# Patient Record
Sex: Male | Born: 1945 | Race: White | Hispanic: No | Marital: Married | Smoking: Never smoker
Health system: Southern US, Community
[De-identification: ages and names within clinical notes are randomized; demographics above are authoritative.]

## PROBLEM LIST (undated history)

## (undated) DIAGNOSIS — I2699 Other pulmonary embolism without acute cor pulmonale: Secondary | ICD-10-CM

## (undated) DIAGNOSIS — C4490 Unspecified malignant neoplasm of skin, unspecified: Secondary | ICD-10-CM

## (undated) DIAGNOSIS — L570 Actinic keratosis: Secondary | ICD-10-CM

## (undated) DIAGNOSIS — G459 Transient cerebral ischemic attack, unspecified: Secondary | ICD-10-CM

## (undated) DIAGNOSIS — I4891 Unspecified atrial fibrillation: Secondary | ICD-10-CM

## (undated) DIAGNOSIS — I499 Cardiac arrhythmia, unspecified: Secondary | ICD-10-CM

## (undated) HISTORY — PX: APPENDECTOMY: SHX54

## (undated) HISTORY — DX: Actinic keratosis: L57.0

## (undated) HISTORY — PX: PROSTATE SURGERY: SHX751

## (undated) HISTORY — PX: OTHER SURGICAL HISTORY: SHX169

---

## 2015-12-12 ENCOUNTER — Encounter: Payer: Self-pay | Admitting: Cardiovascular Disease

## 2016-12-12 ENCOUNTER — Encounter: Payer: Self-pay | Admitting: Cardiovascular Disease

## 2018-12-17 ENCOUNTER — Encounter: Payer: Self-pay | Admitting: Cardiovascular Disease

## 2019-09-05 ENCOUNTER — Ambulatory Visit (INDEPENDENT_AMBULATORY_CARE_PROVIDER_SITE_OTHER): Payer: Medicare Other | Admitting: Urology

## 2019-09-05 ENCOUNTER — Other Ambulatory Visit: Payer: Self-pay

## 2019-09-05 VITALS — BP 110/76 | HR 103 | Ht 76.0 in | Wt 264.0 lb

## 2019-09-05 DIAGNOSIS — C61 Malignant neoplasm of prostate: Secondary | ICD-10-CM | POA: Diagnosis not present

## 2019-09-05 NOTE — Progress Notes (Addendum)
   PATIENT ID: Nicholas [name], male     DOB: 04-26-1945, 74 y.o.     MRN: [id number]   ENCOUNTER: 09/05/19, 1:22 PM     REFERRING PROVIDER: No referring provider defined for this encounter.  Chief Complaint  Patient presents with  . Establish Care    HPI: Nicholas [name] is a 74 y.o. male presenting today to establish care. Pt recently moved to area to be close to family.  History prostate cancer treated 2014.  Wife brought prior urology records which were reviewed:   Prostate biopsy 06/18/2012; PSA 6.8  1/12 cores positive Gleason 3+4 adenocarcinoma involving 100% of the submitted core  Treated with CyberKnife radiation completed August 2014  Last seen by his urologist 02/2019 with PSA of 0.13  PSA March 2016-present range between 0.13 and 0.57  . Denies urinary symptoms of dysuria, hematuria, and flank pain . Symptoms of mild incontinence on rare occasion present . Some urinary hesitancy and urgency present . PSA has been low and stable   PMHx: No past medical history on file.  SURGICAL HISTORY: None  HOME MEDICATIONS:  Allergies as of 09/05/2019   Not on File     Medication List       Accurate as of September 05, 2019  1:22 PM. If you have any questions, ask your nurse or doctor.        atenolol 25 MG tablet Commonly known as: TENORMIN Take 12.5 mg by mouth 2 (two) times daily.   colchicine 0.6 MG tablet   febuxostat 40 MG tablet Commonly known as: ULORIC Take 40 mg by mouth daily.   ofloxacin 0.3 % OTIC solution Commonly known as: FLOXIN SMARTSIG:10 Drop(s) Right Ear Daily   Xarelto 20 MG Tabs tablet Generic drug: rivaroxaban Take 20 mg by mouth daily.       ALLERGIES: Not on File  FAMILY HISTORY: No family history on file.  SOCIAL HISTORY:  has no history on file for tobacco use, alcohol use, and drug use.  PHYSICAL EXAM: BP 110/76 (BP Location: Left Arm, Patient Position: Sitting, Cuff Size: Large)   Pulse (!) 103   Ht 6\' 4"  (1.93 m)    Wt 264 lb (119.7 kg)   BMI 32.14 kg/m   Constitutional:  Alert and oriented, No acute distress. HEENT: [place] AT, moist mucus membranes.  Trachea midline, no masses. Cardiovascular: No clubbing, cyanosis, or edema. Respiratory: Normal respiratory effort, no increased work of breathing. Neurologic: Grossly intact, no focal deficits, moving all 4 extremities. Psychiatric: Normal mood and affect.   ASSESSMENT/PLAN: 1.  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 PCP is to be established with [facility], [place] [address] [place], [place] [phone]  By signing my name below, I, [employer], attest that this documentation has been prepared under the direction and in the presence of John Giovanni, MD. Electronically Signed: Franchot Erichsen 09/05/19, 1:22 PM   I have reviewed the above documentation for accuracy and completeness, and I agree with the above.   Abbie Sons, MD

## 2019-09-05 NOTE — Patient Instructions (Signed)
Prostate Cancer Screening  Prostate cancer screening is a test that is done to check for the presence of prostate cancer in men. The prostate gland is a walnut-sized gland that is located below the bladder and in front of the rectum in males. The function of the prostate is to add fluid to semen during ejaculation. Prostate cancer is the second most common type of cancer in men. Who should have prostate cancer screening?  Screening recommendations vary based on age and other risk factors. Screening is recommended if:  You are older than age 55. If you are age 55-69, talk with your health care provider about your need for screening and how often screening should be done. Because most prostate cancers are slow growing and will not cause death, screening is generally reserved in this age group for men who have a 10-15-year life expectancy.  You are younger than age 55, and you have these risk factors: ? Being a black male or a male of African descent. ? Having a father, brother, or uncle who has been diagnosed with prostate cancer. The risk is higher if your family member's cancer occurred at an early age. Screening is not recommended if:  You are younger than age 40.  You are between the ages of 40 and 54 and you have no risk factors.  You are 76 years of age or older. At this age, the risks that screening can cause are greater than the benefits that it may provide. If you are at high risk for prostate cancer, your health care provider may recommend that you have screenings more often or that you start screening at a younger age. How is screening for prostate cancer done? The recommended prostate cancer screening test is a blood test called the prostate-specific antigen (PSA) test. PSA is a protein that is made in the prostate. As you age, your prostate naturally produces more PSA. Abnormally high PSA levels may be caused by:  Prostate cancer.  An enlarged prostate that is not caused by cancer  (benign prostatic hyperplasia, BPH). This condition is very common in older men.  A prostate gland infection (prostatitis). Depending on the PSA results, you may need more tests, such as:  A physical exam to check the size of your prostate gland.  Blood and imaging tests.  A procedure to remove tissue samples from your prostate gland for testing (biopsy). What are the benefits of prostate cancer screening?  Screening can help to identify cancer at an early stage, before symptoms start and when the cancer can be treated more easily.  There is a small chance that screening may lower your risk of dying from prostate cancer. The chance is small because prostate cancer is a slow-growing cancer, and most men with prostate cancer die from a different cause. What are the risks of prostate cancer screening? The main risk of prostate cancer screening is diagnosing and treating prostate cancer that would never have caused any symptoms or problems. This is called overdiagnosisand overtreatment. PSA screening cannot tell you if your PSA is high due to cancer or a different cause. A prostate biopsy is the only procedure to diagnose prostate cancer. Even the results of a biopsy may not tell you if your cancer needs to be treated. Slow-growing prostate cancer may not need any treatment other than monitoring, so diagnosing and treating it may cause unnecessary stress or other side effects. A prostate biopsy may also cause:  Infection or fever.  A false negative. This is   a result that shows that you do not have prostate cancer when you actually do have prostate cancer. Questions to ask your health care provider  When should I start prostate cancer screening?  What is my risk for prostate cancer?  How often do I need screening?  What type of screening tests do I need?  How do I get my test results?  What do my results mean?  Do I need treatment? Where to find more information  The American Cancer  Society: www.cancer.org  American Urological Association: www.auanet.org Contact a health care provider if:  You have difficulty urinating.  You have pain when you urinate or ejaculate.  You have blood in your urine or semen.  You have pain in your back or in the area of your prostate. Summary  Prostate cancer is a common type of cancer in men. The prostate gland is located below the bladder and in front of the rectum. This gland adds fluid to semen during ejaculation.  Prostate cancer screening may identify cancer at an early stage, when the cancer can be treated more easily.  The prostate-specific antigen (PSA) test is the recommended screening test for prostate cancer.  Discuss the risks and benefits of prostate cancer screening with your health care provider. If you are age 76 or older, the risks that screening can cause are greater than the benefits that it may provide. This information is not intended to replace advice given to you by your health care provider. Make sure you discuss any questions you have with your health care provider. Document Revised: 10/07/2018 Document Reviewed: 10/07/2018 Elsevier Patient Education  2020 Elsevier Inc.  

## 2019-09-06 ENCOUNTER — Telehealth: Payer: Self-pay | Admitting: *Deleted

## 2019-09-06 LAB — URINALYSIS, COMPLETE
Bilirubin, UA: NEGATIVE
Glucose, UA: NEGATIVE
Leukocytes,UA: NEGATIVE
Nitrite, UA: NEGATIVE
Protein,UA: NEGATIVE
Specific Gravity, UA: 1.025 (ref 1.005–1.030)
Urobilinogen, Ur: 0.2 mg/dL (ref 0.2–1.0)
pH, UA: 5 (ref 5.0–7.5)

## 2019-09-06 LAB — PSA: Prostate Specific Ag, Serum: 0.3 ng/mL (ref 0.0–4.0)

## 2019-09-06 LAB — MICROSCOPIC EXAMINATION: Bacteria, UA: NONE SEEN

## 2019-09-06 NOTE — Telephone Encounter (Signed)
Notified patient as instructed, patient pleased. Discussed follow-up appointments, patient agrees  

## 2019-09-06 NOTE — Telephone Encounter (Signed)
-----   Message from Abbie Sons, MD sent at 09/06/2019  7:48 AM EDT ----- PSA was 0.3.  42-month repeat as scheduled

## 2019-09-07 ENCOUNTER — Encounter: Payer: Self-pay | Admitting: Urology

## 2019-09-09 ENCOUNTER — Other Ambulatory Visit: Payer: Self-pay | Admitting: Urology

## 2019-09-09 DIAGNOSIS — Z1211 Encounter for screening for malignant neoplasm of colon: Secondary | ICD-10-CM

## 2019-09-20 ENCOUNTER — Ambulatory Visit: Payer: Self-pay | Admitting: Cardiovascular Disease

## 2019-10-18 DIAGNOSIS — M1A9XX0 Chronic gout, unspecified, without tophus (tophi): Secondary | ICD-10-CM

## 2019-10-18 DIAGNOSIS — M1A00X0 Idiopathic chronic gout, unspecified site, without tophus (tophi): Secondary | ICD-10-CM

## 2019-10-18 HISTORY — DX: Idiopathic chronic gout, unspecified site, without tophus (tophi): M1A.00X0

## 2019-10-18 HISTORY — DX: Chronic gout, unspecified, without tophus (tophi): M1A.9XX0

## 2019-10-25 DIAGNOSIS — C801 Malignant (primary) neoplasm, unspecified: Secondary | ICD-10-CM

## 2019-10-25 HISTORY — DX: Malignant (primary) neoplasm, unspecified: C80.1

## 2019-11-21 ENCOUNTER — Other Ambulatory Visit: Payer: Self-pay

## 2019-11-21 ENCOUNTER — Other Ambulatory Visit
Admission: RE | Admit: 2019-11-21 | Discharge: 2019-11-21 | Disposition: A | Discharge status: Home / Self Care | Payer: Medicare Other | Source: Ambulatory Visit | Attending: Internal Medicine | Admitting: Internal Medicine

## 2019-11-21 DIAGNOSIS — Z20822 Contact with and (suspected) exposure to covid-19: Secondary | ICD-10-CM | POA: Insufficient documentation

## 2019-11-21 DIAGNOSIS — Z01812 Encounter for preprocedural laboratory examination: Secondary | ICD-10-CM | POA: Insufficient documentation

## 2019-11-22 ENCOUNTER — Encounter: Payer: Self-pay | Admitting: Internal Medicine

## 2019-11-22 LAB — SARS CORONAVIRUS 2 (TAT 6-24 HRS): SARS Coronavirus 2: NEGATIVE

## 2019-11-23 ENCOUNTER — Ambulatory Visit: Payer: Medicare Other | Admitting: Anesthesiology

## 2019-11-23 ENCOUNTER — Ambulatory Visit
Admission: RE | Admit: 2019-11-23 | Discharge: 2019-11-23 | Disposition: A | Discharge status: Home / Self Care | Payer: Medicare Other | Attending: Internal Medicine | Admitting: Internal Medicine

## 2019-11-23 ENCOUNTER — Encounter: Payer: Self-pay | Admitting: Internal Medicine

## 2019-11-23 ENCOUNTER — Encounter
Admission: RE | Disposition: A | Discharge status: Home / Self Care | Payer: Self-pay | Source: Home / Self Care | Attending: Internal Medicine

## 2019-11-23 DIAGNOSIS — Z7901 Long term (current) use of anticoagulants: Secondary | ICD-10-CM | POA: Diagnosis not present

## 2019-11-23 DIAGNOSIS — Z86711 Personal history of pulmonary embolism: Secondary | ICD-10-CM | POA: Diagnosis not present

## 2019-11-23 DIAGNOSIS — Z8719 Personal history of other diseases of the digestive system: Secondary | ICD-10-CM | POA: Insufficient documentation

## 2019-11-23 DIAGNOSIS — Z880 Allergy status to penicillin: Secondary | ICD-10-CM | POA: Diagnosis not present

## 2019-11-23 DIAGNOSIS — Z8673 Personal history of transient ischemic attack (TIA), and cerebral infarction without residual deficits: Secondary | ICD-10-CM | POA: Insufficient documentation

## 2019-11-23 DIAGNOSIS — M1990 Unspecified osteoarthritis, unspecified site: Secondary | ICD-10-CM | POA: Diagnosis not present

## 2019-11-23 DIAGNOSIS — K643 Fourth degree hemorrhoids: Secondary | ICD-10-CM | POA: Diagnosis not present

## 2019-11-23 DIAGNOSIS — Z888 Allergy status to other drugs, medicaments and biological substances status: Secondary | ICD-10-CM | POA: Diagnosis not present

## 2019-11-23 DIAGNOSIS — I4891 Unspecified atrial fibrillation: Secondary | ICD-10-CM | POA: Insufficient documentation

## 2019-11-23 DIAGNOSIS — D120 Benign neoplasm of cecum: Secondary | ICD-10-CM | POA: Insufficient documentation

## 2019-11-23 DIAGNOSIS — Z1211 Encounter for screening for malignant neoplasm of colon: Secondary | ICD-10-CM | POA: Insufficient documentation

## 2019-11-23 DIAGNOSIS — Z79899 Other long term (current) drug therapy: Secondary | ICD-10-CM | POA: Insufficient documentation

## 2019-11-23 DIAGNOSIS — Z7982 Long term (current) use of aspirin: Secondary | ICD-10-CM | POA: Insufficient documentation

## 2019-11-23 DIAGNOSIS — K5730 Diverticulosis of large intestine without perforation or abscess without bleeding: Secondary | ICD-10-CM | POA: Diagnosis not present

## 2019-11-23 HISTORY — PX: COLONOSCOPY WITH PROPOFOL: SHX5780

## 2019-11-23 HISTORY — DX: Transient cerebral ischemic attack, unspecified: G45.9

## 2019-11-23 HISTORY — DX: Other pulmonary embolism without acute cor pulmonale: I26.99

## 2019-11-23 HISTORY — DX: Cardiac arrhythmia, unspecified: I49.9

## 2019-11-23 HISTORY — DX: Unspecified atrial fibrillation: I48.91

## 2019-11-23 SURGERY — COLONOSCOPY WITH PROPOFOL
Anesthesia: General

## 2019-11-23 MED ORDER — PROPOFOL 500 MG/50ML IV EMUL
INTRAVENOUS | Status: AC
  Filled 2019-11-23: qty 50

## 2019-11-23 MED ORDER — PROPOFOL 10 MG/ML IV BOLUS
INTRAVENOUS | Status: AC
  Filled 2019-11-23: qty 20

## 2019-11-23 MED ORDER — PHENYLEPHRINE HCL (PRESSORS) 10 MG/ML IV SOLN
INTRAVENOUS | Status: DC | PRN
  Administered 2019-11-23: 100 ug via INTRAVENOUS

## 2019-11-23 MED ORDER — SODIUM CHLORIDE 0.9 % IV SOLN
INTRAVENOUS | Status: DC
  Administered 2019-11-23: 1000 mL via INTRAVENOUS

## 2019-11-23 MED ORDER — PROPOFOL 500 MG/50ML IV EMUL
INTRAVENOUS | Status: DC | PRN
  Administered 2019-11-23: 150 ug/kg/min via INTRAVENOUS

## 2019-11-23 MED ORDER — LIDOCAINE HCL (PF) 2 % IJ SOLN
INTRAMUSCULAR | Status: AC
  Filled 2019-11-23: qty 5

## 2019-11-23 NOTE — Interval H&P Note (Signed)
History and Physical Interval Note:  11/23/2019 1:49 PM  Nicholas [name]  has presented today for surgery, with the diagnosis of HX COLONIC POLYPS.  The various methods of treatment have been discussed with the patient and family. After consideration of risks, benefits and other options for treatment, the patient has consented to  Procedure(s): COLONOSCOPY WITH PROPOFOL (N/A) as a surgical intervention.  The patient's history has been reviewed, patient examined, no change in status, stable for surgery.  I have reviewed the patient's chart and labs.  Questions were answered to the patient's satisfaction.     [place], [facility]

## 2019-11-23 NOTE — Anesthesia Preprocedure Evaluation (Signed)
Anesthesia Evaluation  Patient identified by MRN, date of birth, ID band Patient awake    Reviewed: Allergy & Precautions, NPO status , Patient's Chart, lab work & pertinent test results  History of Anesthesia Complications Negative for: history of anesthetic complications  Airway Mallampati: II  TM Distance: >3 FB Neck ROM: Full    Dental  (+) Poor Dentition   Pulmonary neg pulmonary ROS, neg sleep apnea, neg COPD,    breath sounds clear to auscultation- rhonchi (-) wheezing      Cardiovascular (-) hypertension(-) CAD, (-) Past MI, (-) Cardiac Stents and (-) CABG + dysrhythmias Atrial Fibrillation  Rhythm:Regular Rate:Normal - Systolic murmurs and - Diastolic murmurs    Neuro/Psych neg Seizures TIAnegative psych ROS   GI/Hepatic negative GI ROS, Neg liver ROS,   Endo/Other  negative endocrine ROSneg diabetes  Renal/GU negative Renal ROS     Musculoskeletal  (+) Arthritis ,   Abdominal (+) + obese,   Peds  Hematology negative hematology ROS (+)   Anesthesia Other Findings Past Medical History: No date: A-fib ([place]) 10/25/2019: Cancer ([place])     Comment:  Prostate 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 No date: Dysrhythmia No date: Pulmonary embolism ([facility]) No date: TIA (transient ischemic attack)   Reproductive/Obstetrics                             Anesthesia Physical Anesthesia Plan  ASA: III  Anesthesia Plan: General   Post-op Pain Management:    Induction: Intravenous  PONV Risk Score and Plan: 1 and Propofol infusion  Airway Management Planned: Natural Airway  Additional Equipment:   Intra-op Plan:   Post-operative Plan:   Informed Consent: I have reviewed the patients History and Physical, chart, labs and discussed the procedure including the risks, benefits and alternatives for the proposed anesthesia with the patient or authorized representative who has indicated  his/her understanding and acceptance.     Dental advisory given  Plan Discussed with: CRNA and Anesthesiologist  Anesthesia Plan Comments:         Anesthesia Quick Evaluation

## 2019-11-23 NOTE — Anesthesia Procedure Notes (Signed)
Performed by: Cook-Martin, Feliberto Stockley Pre-anesthesia Checklist: Patient identified, Emergency Drugs available, Suction available, Patient being monitored and Timeout performed Patient Re-evaluated:Patient Re-evaluated prior to induction Oxygen Delivery Method: Supernova nasal CPAP Preoxygenation: Pre-oxygenation with 100% oxygen Induction Type: IV induction Placement Confirmation: positive ETCO2 and CO2 detector       

## 2019-11-23 NOTE — Op Note (Signed)
[facility] Gastroenterology Patient Name: Nicholas [name] Procedure Date: 11/23/2019 2:13 PM MRN: [id number] Account #: [id number] Date of Birth: [birth year] Admit Type: Outpatient Age: 74 Room: [facility] ENDO ROOM 4 Gender: Male Note Status: Finalized Procedure:             Colonoscopy Indications:           High risk colon cancer surveillance: Personal history                         of colonic polyps Providers:             Benay Pike. Aharon Carriere MD, MD Medicines:             Propofol per Anesthesia Complications:         No immediate complications. Estimated blood loss: None. Procedure:             Pre-Anesthesia Assessment:                        - The risks and benefits of the procedure and the                         sedation options and risks were discussed with the                         patient. All questions were answered and informed                         consent was obtained.                        - ASA Grade Assessment: III - A patient with severe                         systemic disease.                        - After reviewing the risks and benefits, the patient                         was deemed in satisfactory condition to undergo the                         procedure.                        After obtaining informed consent, the colonoscope was                         passed under direct vision. Throughout the procedure,                         the patient's blood pressure, pulse, and oxygen                         saturations were monitored continuously. The                         Colonoscope was introduced through the anus and  advanced to the the cecum, identified by appendiceal                         orifice and ileocecal valve. The colonoscopy was                         somewhat difficult due to a redundant colon and                         significant looping. Successful completion of the                          procedure was aided by applying abdominal pressure.                         The patient tolerated the procedure well. The quality                         of the bowel preparation was adequate. The ileocecal                         valve, appendiceal orifice, and rectum were                         photographed. Findings:      The perianal exam findings include internal hemorrhoids that do not       return to the anal canal, thus continuously prolapsed (Grade IV).      The digital rectal exam was normal. Pertinent negatives include normal       sphincter tone.      The mucosa vascular pattern in the rectum was locally increased.       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 No stigmata of active or recent bleeding from the       rectal mucosa. Estimated blood loss: none.      Non-bleeding internal hemorrhoids were found during retroflexion. The       hemorrhoids were Grade III (internal hemorrhoids that prolapse but       require manual reduction).      A few medium-mouthed diverticula were found in the sigmoid colon.      A 5 mm polyp was found in the appendiceal orifice. The polyp was       sessile. The polyp was removed with a cold snare. Resection and       retrieval were complete.      A 16 mm polyp was found in the cecum. The polyp was sessile. The polyp       was removed with a hot snare. Resection and retrieval were complete. To       prevent bleeding after the polypectomy, two hemostatic clips were       successfully placed (MR conditional). There was no bleeding during, or       at the end, of the procedure.      The exam was otherwise without abnormality. Impression:            - Internal hemorrhoids that do not return to the anal  canal, thus continuously prolapsed (Grade IV) found on                         perianal exam.                        - Increased mucosa vascular pattern in the rectum.                         - Non-bleeding internal hemorrhoids.                        - Diverticulosis in the sigmoid colon.                        - One 5 mm polyp at the appendiceal orifice, removed                         with a cold snare. Resected and retrieved.                        - One 16 mm polyp in the cecum, removed with a hot                         snare. Resected and retrieved. Clips (MR conditional)                         were placed.                        - The examination was otherwise normal. Recommendation:        - Patient has a contact number available for                         emergencies. The signs and symptoms of potential                         delayed complications were discussed with the patient.                         Return to normal activities tomorrow. Written                         discharge instructions were provided to the patient.                        - Resume previous diet.                        - Resume antiplatelet medication at prior dose today.                         Refer to managing physician for further adjustment of                         therapy.                        - Await pathology results.                        -  Repeat colonoscopy after studies are complete for                         surveillance based on pathology results.                        - Return to GI office PRN.                        - The findings and recommendations were discussed with                         the patient. Procedure Code(s):     --- Professional ---                        [phone], Colonoscopy, flexible; with removal of                         tumor(s), polyp(s), or other lesion(s) by snare                         technique Diagnosis Code(s):     --- Professional ---                        K57.30, Diverticulosis of large intestine without                         perforation or abscess without bleeding                        K63.5, Polyp of colon                         K64.3, Fourth degree hemorrhoids                        Z86.010, Personal history of colonic polyps CPT copyright 2019 American Medical Association. All rights reserved. The codes documented in this report are preliminary and upon coder review may  be revised to meet current compliance requirements. Efrain Sella MD, MD 11/23/2019 3:08:53 PM This report has been signed electronically. Number of Addenda: 0 Note Initiated On: 11/23/2019 2:13 PM Scope Withdrawal Time: 0 hours 32 minutes 21 seconds  Total Procedure Duration: 0 hours 39 minutes 29 seconds  Estimated Blood Loss:  Estimated blood loss: none.      [facility]

## 2019-11-23 NOTE — Transfer of Care (Signed)
Immediate Anesthesia Transfer of Care Note  Patient: Nicholas [name]  Procedure(s) Performed: COLONOSCOPY WITH PROPOFOL (N/A )  Patient Location: PACU  Anesthesia Type:General  Level of Consciousness: drowsy  Airway & Oxygen Therapy: Patient Spontanous Breathing and Patient connected to face mask oxygen  Post-op Assessment: Report given to RN and Post -op Vital signs reviewed and stable  Post vital signs: Reviewed and stable  Last Vitals:  Vitals Value Taken Time  BP 100/69 11/23/19 1503  Temp 35.7 C 11/23/19 1500  Pulse 85 11/23/19 1505  Resp 18 11/23/19 1500  SpO2 96 % 11/23/19 1505  Vitals shown include unvalidated device data.  Last Pain:  Vitals:   11/23/19 1500  TempSrc: Temporal  PainSc: Asleep      Patients Stated Pain Goal: 0 (39/76/73 [id number])  Complications: No complications documented.

## 2019-11-23 NOTE — Anesthesia Postprocedure Evaluation (Signed)
Anesthesia Post Note  Patient: Nicholas [name]  Procedure(s) Performed: COLONOSCOPY WITH PROPOFOL (N/A )  Patient location during evaluation: Endoscopy Anesthesia Type: General Level of consciousness: awake and alert and oriented Pain management: pain level controlled Vital Signs Assessment: post-procedure vital signs reviewed and stable Respiratory status: spontaneous breathing, nonlabored ventilation and respiratory function stable Cardiovascular status: blood pressure returned to baseline and stable Postop Assessment: no signs of nausea or vomiting Anesthetic complications: no   No complications documented.   Last Vitals:  Vitals:   11/23/19 1343 11/23/19 1500  BP: 125/80 100/69  Pulse: 91 86  Resp: 20 18  Temp: (!) 35.8 C (!) 35.7 C  SpO2: 96% 96%    Last Pain:  Vitals:   11/23/19 1500  TempSrc: Temporal  PainSc: Asleep                 Geraldyne Barraclough

## 2019-11-23 NOTE — H&P (Signed)
Outpatient short stay form Pre-procedure 11/23/2019 1:48 PM Nicholas [name] K. Alice Reichert, M.D.  Primary Physician: Emily Filbert, M.D.  Reason for visit:  Personal history of colon polyps  History of present illness:                           Patient presents for colonoscopy for a personal hx of colon polyps. The patient denies abdominal pain, abnormal weight loss or rectal bleeding.      Current Facility-Administered Medications:  .  0.9 %  sodium chloride infusion, , Intravenous, Continuous, Soham Hollett, Benay Pike, MD  Medications Prior to Admission  Medication Sig Dispense Refill Last Dose  . aspirin EC 81 MG tablet Take 81 mg by mouth daily. Swallow whole.   Past Week at Unknown time  . atenolol (TENORMIN) 25 MG tablet Take 12.5 mg by mouth 2 (two) times daily.   11/22/2019 at 0800  . febuxostat (ULORIC) 40 MG tablet Take 40 mg by mouth daily.   Past Week at Unknown time  . XARELTO 20 MG TABS tablet Take 20 mg by mouth daily.   Past Week at Unknown time  . colchicine 0.6 MG tablet  (Patient not taking: Reported on 11/23/2019)   Not Taking at Unknown time  . ofloxacin (FLOXIN) 0.3 % OTIC solution SMARTSIG:10 Drop(s) Right Ear Daily        Allergies  Allergen Reactions  . Penicillins Other (See Comments)  . Allopurinol Hives     Past Medical History:  Diagnosis Date  . A-fib ([place])   . Cancer ([place]) 10/25/2019   Prostate  . 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 Dysrhythmia   . Pulmonary embolism ([place])   . TIA (transient ischemic attack)     Review of systems:  Otherwise negative.    Physical Exam  Gen: Alert, oriented. Appears stated age.  HEENT: [place]/AT. PERRLA. Lungs: CTA, no wheezes. CV: RR nl S1, S2. Abd: soft, benign, no masses. BS+ Ext: No edema. Pulses 2+    Planned procedures: Proceed with colonoscopy. The patient understands the nature of the planned procedure, indications, risks, alternatives and potential complications including but not limited to bleeding, infection,  perforation, damage to internal organs and possible oversedation/side effects from anesthesia. The patient agrees and gives consent to proceed.  Please refer to procedure notes for findings, recommendations and patient disposition/instructions.     [name] K. Alice Reichert, M.D. Gastroenterology 11/23/2019  1:48 PM

## 2019-11-25 LAB — SURGICAL PATHOLOGY

## 2019-12-21 ENCOUNTER — Ambulatory Visit: Payer: Self-pay | Admitting: Cardiovascular Disease

## 2019-12-22 ENCOUNTER — Encounter: Payer: Self-pay | Admitting: Dermatology

## 2019-12-22 ENCOUNTER — Other Ambulatory Visit: Payer: Self-pay

## 2019-12-22 ENCOUNTER — Ambulatory Visit (INDEPENDENT_AMBULATORY_CARE_PROVIDER_SITE_OTHER): Payer: Medicare Other | Admitting: Dermatology

## 2019-12-22 DIAGNOSIS — L578 Other skin changes due to chronic exposure to nonionizing radiation: Secondary | ICD-10-CM

## 2019-12-22 DIAGNOSIS — L603 Nail dystrophy: Secondary | ICD-10-CM

## 2019-12-22 DIAGNOSIS — L820 Inflamed seborrheic keratosis: Secondary | ICD-10-CM

## 2019-12-22 DIAGNOSIS — Z1283 Encounter for screening for malignant neoplasm of skin: Secondary | ICD-10-CM

## 2019-12-22 DIAGNOSIS — L821 Other seborrheic keratosis: Secondary | ICD-10-CM

## 2019-12-22 DIAGNOSIS — D1800 Hemangioma unspecified site: Secondary | ICD-10-CM

## 2019-12-22 DIAGNOSIS — L570 Actinic keratosis: Secondary | ICD-10-CM | POA: Diagnosis not present

## 2019-12-22 DIAGNOSIS — B356 Tinea cruris: Secondary | ICD-10-CM

## 2019-12-22 DIAGNOSIS — D229 Melanocytic nevi, unspecified: Secondary | ICD-10-CM

## 2019-12-22 DIAGNOSIS — L814 Other melanin hyperpigmentation: Secondary | ICD-10-CM | POA: Diagnosis not present

## 2019-12-22 MED ORDER — KETOCONAZOLE 2 % EX CREA: 1.0000 "application " | TOPICAL_CREAM | Freq: Every morning | CUTANEOUS | 2 refills | Status: DC

## 2019-12-22 NOTE — Patient Instructions (Addendum)
Actinic Keratosis  What is an actinic keratosis? An actinic keratosis (plural: actinic keratoses) is growth on the surface of the skin that usually appears as a red, hard, crusty or scaly bump.  What causes actinic keratoses? Repeated prolonged sun exposure causes skin damage, especially in fair-skinned persons. Sun-damaged skin becomes dry and wrinkled and may form rough, scaly spots called actinic keratoses. These rough spots remain on the skin even though the crust or scale on top is picked off.  Why treat actinic keratoses? Actinic keratoses are not skin cancers, but because they may sometimes turn cancerous they are called "pre-cancerous". Not all will turn to skin cancer, and it usually takes several years for this to happen. Because it is much easier to treat an actinic keratosis then it is to remove a skin cancer, actinic keratoses should be treated to prevent future skin cancer.  How are actinic keratoses treated? The most common way of treating actinic keratoses is to freeze them with liquid nitrogen. Freezing causes scabbing and shedding of the sun-damaged skin. Healing after a removal usually takes two weeks, depending on the size and location of the keratosis. Hands and legs heal more slowly than the face. The skin's final appearance is usually excellent. There are several topical medications that can be used to treat actinic keratoses. These medications generally have side effects of redness, crusting, and pain.Some are used for a few days, and some for several months before the actinic keratosis is completely gone. Photodynamic therapy is another alternative to freezing actinic keratoses.This treatment is done in a physician's office.A medication is applied to the area of skin with actinic keratoses, and it is allowed to soak in for one or more hours. A special light is then applied to the skin.Side effects include redness, burning, and peeling.  How can you prevent actinic  keratoses? Protection from the sun is the best way to prevent actinic keratoses.The use of proper clothing and sunscreens can prevent the sun damage that leads to an actinic keratosis. Unfortunately, some sun damage is permanent. Once sun damage has progressed to the point where actinic keratoses develop, new keratoses may appear even without further sun exposure. However, even in skin that is already heavily sun damaged, good sun protection can help reduce the number of actinic keratoses that will appear.           Seborrheic Keratosis  What causes seborrheic keratoses? Seborrheic keratoses are harmless, common skin growths that first appear during adult life.  As time goes by, more growths appear.  Some people may develop a large number of them.  Seborrheic keratoses appear on both covered and uncovered body parts.  They are not caused by sunlight.  The tendency to develop seborrheic keratoses can be inherited.  They vary in color from skin-colored to gray, brown, or even black.  They can be either smooth or have a rough, warty surface.   Seborrheic keratoses are superficial and look as if they were stuck on the skin.  Under the microscope this type of keratosis looks like layers upon layers of skin.  That is why at times the top layer may seem to fall off, but the rest of the growth remains and re-grows.    Treatment Seborrheic keratoses do not need to be treated, but can easily be removed in the office.  Seborrheic keratoses often cause symptoms when they rub on clothing or jewelry.  Lesions can be in the way of shaving.  If they become inflamed, they can  cause itching, soreness, or burning.  Removal of a seborrheic keratosis can be accomplished by freezing, burning, or surgery. If any spot bleeds, scabs, or grows rapidly, please return to have it checked, as these can be an indication of a skin cancer.     For rash in groin  Start Ketoconazole 2% cream in the morning to  groin Continue Zeasorb AF powder in the evening to groin

## 2019-12-22 NOTE — Progress Notes (Signed)
New Patient Visit  Subjective  Nicholas [name] is a 74 y.o. male who presents for the following: Mole check (Total body skin exam, New patient, ). The patient presents for Total-Body Skin Exam (TBSE) for skin cancer screening and mole check.  Patient accompanied by wife.  Reviewed notes/pathologies from previous dermatologist Dr. Gladstone Lighter, [facility].  The following portions of the chart were reviewed this encounter and updated as appropriate:  Tobacco  Allergies  Meds  Problems  Med Hx  Surg Hx  Fam Hx     Review of Systems:  No other skin or systemic complaints except as noted in HPI or Assessment and Plan.  Objective  Well appearing patient in no apparent distress; mood and affect are within normal limits.  A full examination was performed including scalp, head, eyes, ears, nose, lips, neck, chest, axillae, abdomen, back, buttocks, bilateral upper extremities, bilateral lower extremities, hands, feet, fingers, toes, fingernails, and toenails. All findings within normal limits unless otherwise noted below.  Objective  face, ears x 7 (7): Pink scaly macules   Objective  Pubic: Pinkness groin  Objective  Right preauricular x 2 (2): Erythematous keratotic or waxy stuck-on papule or plaque.   Objective  L 3rd toenail: Toenail dystrophy L 3rd toenail, all other toenails clear   Assessment & Plan  AK (actinic keratosis) (7) face, ears x 7  Destruction of lesion - face, ears x 7 Complexity: simple   Destruction method: cryotherapy   Informed consent: discussed and consent obtained   Timeout:  patient name, date of birth, surgical site, and procedure verified Lesion destroyed using liquid nitrogen: Yes   Region frozen until ice ball extended beyond lesion: Yes   Outcome: patient tolerated procedure well with no complications   Post-procedure details: wound care instructions given    Tinea cruris Pubic  Start Ketoconazole  2% cr qam to groin Cont Zeasorb AF powder qhs  ketoconazole (NIZORAL) 2 % cream - Pubic  Inflamed seborrheic keratosis (2) Right preauricular x 2  Destruction of lesion - Right preauricular x 2 Complexity: simple   Destruction method: cryotherapy   Informed consent: discussed and consent obtained   Timeout:  patient name, date of birth, surgical site, and procedure verified Lesion destroyed using liquid nitrogen: Yes   Region frozen until ice ball extended beyond lesion: Yes   Outcome: patient tolerated procedure well with no complications   Post-procedure details: wound care instructions given    Nail dystrophy L 3rd toenail  Trauma vs Psoriasis vs less likely Tinea  Hx of trauma years ago  Skin cancer screening   Lentigines - Scattered tan macules - Discussed due to sun exposure - Benign, observe - Call for any changes  Seborrheic Keratoses - Stuck-on, waxy, tan-brown papules and plaques  - Discussed benign etiology and prognosis. - Observe - Call for any changes  Melanocytic Nevi - Tan-brown and/or pink-flesh-colored symmetric macules and papules - Benign appearing on exam today - Observation - Call clinic for new or changing moles - Recommend daily use of broad spectrum spf 30+ sunscreen to sun-exposed areas.   Hemangiomas - Red papules - Discussed benign nature - Observe - Call for any changes  Actinic Damage - diffuse scaly erythematous macules with underlying dyspigmentation - Recommend daily broad spectrum sunscreen SPF 30+ to sun-exposed areas, reapply every 2 hours as needed.  - Call for new or changing lesions.  Skin cancer screening performed today.  Return in about 1 year (  around 12/21/2020) for TBSE, hx AKs.   I, Othelia Pulling, RMA, am acting as scribe for Sarina Ser, MD .  Documentation: I have reviewed the above documentation for accuracy and completeness, and I agree with the above.  Sarina Ser, MD

## 2019-12-26 ENCOUNTER — Ambulatory Visit: Payer: Self-pay | Admitting: Cardiovascular Disease

## 2020-01-10 ENCOUNTER — Other Ambulatory Visit: Payer: Self-pay

## 2020-01-10 ENCOUNTER — Ambulatory Visit (INDEPENDENT_AMBULATORY_CARE_PROVIDER_SITE_OTHER): Payer: Medicare Other | Admitting: Cardiovascular Disease

## 2020-01-10 ENCOUNTER — Encounter: Payer: Self-pay | Admitting: Cardiovascular Disease

## 2020-01-10 VITALS — BP 100/80 | HR 75 | Ht 76.0 in | Wt 265.0 lb

## 2020-01-10 DIAGNOSIS — G459 Transient cerebral ischemic attack, unspecified: Secondary | ICD-10-CM

## 2020-01-10 DIAGNOSIS — I4821 Permanent atrial fibrillation: Secondary | ICD-10-CM | POA: Diagnosis not present

## 2020-01-10 DIAGNOSIS — I749 Embolism and thrombosis of unspecified artery: Secondary | ICD-10-CM

## 2020-01-10 DIAGNOSIS — Z7189 Other specified counseling: Secondary | ICD-10-CM

## 2020-01-10 DIAGNOSIS — I359 Nonrheumatic aortic valve disorder, unspecified: Secondary | ICD-10-CM | POA: Diagnosis not present

## 2020-01-10 NOTE — Patient Instructions (Signed)
Medication Instructions:  No changes  Please call if you get dizzy when standing up  If you need a refill on your cardiac medications before your next appointment, please call your pharmacy.    Lab work: No new labs needed   If you have labs (blood work) drawn today and your tests are completely normal, you will receive your results only by: Marland Kitchen MyChart Message (if you have MyChart) OR . A paper copy in the mail If you have any lab test that is abnormal or we need to change your treatment, we will call you to review the results.   Testing/Procedures: No new testing needed   Follow-Up: At [facility], you and your health needs are our priority.  As part of our continuing mission to provide you with exceptional heart care, we have created designated Provider Care Teams.  These Care Teams include your primary Cardiologist (physician) and Advanced Practice Providers (APPs -  Physician Assistants and Nurse Practitioners) who all work together to provide you with the care you need, when you need it.  . You will need a follow up appointment in 6 months  . Providers on your designated Care Team:   . Murray Hodgkins, NP . Christell Faith, PA-C . Marrianne Mood, PA-C  Any Other Special Instructions Will Be Listed Below (If Applicable).  COVID-19 Vaccine Information can be found at: ShippingScam.co.uk For questions related to vaccine distribution or appointments, please email [email or web address] .[email or web address] or call [phone].

## 2020-01-10 NOTE — Progress Notes (Signed)
Cardiology Office Note  Date:  01/10/2020   ID:  Nicholas [name], Nicholas [name] November 29, 1945, MRN [id number]  PCP:  Rusty Aus, MD   Chief Complaint  Patient presents with  . New Patient (Initial Visit)    Establish cardiac care; Meds verbally reviewed with patient.    HPI:  [name] is a 74 year old gentleman with past medical history of Persistent atrial fibrillation since 2006 TIAs 2006, 2010 initially treated with warfarin, aspirin added later secondary to TIAs Epistaxis 2012 transition to aspirin and Xarelto Previously tried flecainide, Tikosyn, Multaq, Ablation was considered but patient preferred rate control with low-dose atenolol History of sleep apnea on CPAP Migraines, prostate cancer (cyber knife surgery x 6) Who presents to establish care in the [place] office for his permanent atrial fibrillation  Recently moved from [place] to [facility] requested, received and reviewed on today's visit from outpatient cardiologist and primary care  Prior echocardiograms 2010, 2012, 2015 all with normal ejection fraction Echocardiogram April 2021 at outside facility Ejection fraction 55 to 60%, severely dilated left and right atrium, mild to moderate AI  Prior stress test 2010, 2015 Stress test June 28, 2019 Diaphragmatic attenuation artifact noted Small fixed defect anterior wall mild, suspected secondary to artifact Ejection fraction 69%  Tolerated atenolol 12.5 BID, BP always low  Neuropathy at night, comes and goes  Baseline EKG from December 12, 2015 atrial fibrillation with T wave abnormality V3 through V6, 2, 3, aVF  Labs reviewed: WBC 17 in aug 2021 HGB 6.0 Total chol 159, LDL 73  EKG personally reviewed by myself on todays visit Atrial fibrillation ventricular rate 75 bpm T wave abnormality V3 through V6 1 and aVL  PMH:   has a past medical history of A-fib ([place]), Cancer ([place]) (10/25/2019), 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  PSH:    Past Surgical History:  Procedure Laterality Date  . APPENDECTOMY    . COLONOSCOPY WITH PROPOFOL N/A 11/23/2019   Procedure: COLONOSCOPY WITH PROPOFOL;  Surgeon: Toledo, Benay Pike, MD;  Location: [facility] ENDOSCOPY;  Service: Gastroenterology;  Laterality: N/A;  . cyber knife surgery    . PROSTATE SURGERY      Current Outpatient Medications  Medication Sig Dispense Refill  . aspirin EC 81 MG tablet Take 81 mg by mouth daily. Swallow whole.    Marland Kitchen atenolol (TENORMIN) 25 MG tablet Take 12.5 mg by mouth 2 (two) times daily.    . colchicine 0.6 MG tablet Take 0.6 mg by mouth as needed.    . febuxostat (ULORIC) 40 MG tablet Take 40 mg by mouth daily.    Marland Kitchen ketoconazole (NIZORAL) 2 % cream Apply 1 application topically in the morning. qam to rash in groin 60 g 2  . ofloxacin (FLOXIN) 0.3 % OTIC solution as needed.     Alveda Reasons 20 MG TABS tablet Take 20 mg by mouth daily.    . colchicine 0.6 MG tablet  (Patient not taking: Reported on 11/23/2019)     No current facility-administered medications for this visit.     Allergies:   Penicillins and Allopurinol   Social History:  The patient  reports that he has never smoked. He has never used smokeless tobacco. He reports that he does not drink alcohol and does not use drugs.   Family History:   family history is not on file.    Review of Systems: Review of Systems  Constitutional: Negative.   HENT: Negative.  Respiratory: Negative.   Cardiovascular: Negative.   Gastrointestinal: Negative.   Musculoskeletal: Negative.   Neurological: Negative.   Psychiatric/Behavioral: Negative.   All other systems reviewed and are negative.    PHYSICAL EXAM: VS:  BP 100/80 (BP Location: Right Arm, Patient Position: Sitting, Cuff Size: Large)   Pulse 75   Ht 6\' 4"  (1.93 m)   Wt 265 lb (120.2 kg)   SpO2 97%   BMI 32.26 kg/m  , BMI Body mass index is 32.26 kg/m. Constitutional:   oriented to person, place, and time. No distress.  HENT:  Head: Grossly normal Eyes:  no discharge. No scleral icterus.  Neck: No JVD, no carotid bruits  Cardiovascular: Regular rate and rhythm, no murmurs appreciated Pulmonary/Chest: Clear to auscultation bilaterally, no wheezes or rails Abdominal: Soft.  no distension.  no tenderness.  Musculoskeletal: Normal range of motion Neurological:  normal muscle tone. Coordination normal. No atrophy Skin: Skin warm and dry Psychiatric: normal affect, pleasant  Recent Labs: No results found for requested labs within last 8760 hours.    Lipid Panel No results found for: CHOL, HDL, LDLCALC, TRIG    Wt Readings from Last 3 Encounters:  01/10/20 265 lb (120.2 kg)  11/23/19 254 lb (115.2 kg)  09/05/19 264 lb (119.7 kg)     ASSESSMENT AND PLAN:  Problem List Items Addressed This Visit    None    Visit Diagnoses    Permanent atrial fibrillation ([place])    -  Primary   Relevant Orders   EKG 12-Lead   TIA due to embolism ([place])       Encounter for anticoagulation discussion and counseling       Aortic valve disorder         Permanent atrial fibrillation Rate controlled, asymptomatic Continue atenolol 12.5 twice daily Recommend he call for any orthostasis symptoms Continue Xarelto, discussed risk of bleeding  TIA Reports remote history, outside cardiologist had placed him on warfarin aspirin initially then transition to aspirin and Xarelto Denies any recurrent TIAs, discussed whether he needed to continue on aspirin, will continue for now and will discuss further in follow-up Prior history of epistaxis, for recurrent symptoms would stop the aspirin  Leg weakness/deconditioning Lifestyle modification recommended, walking program  Obesity As above recommend dietary changes walking program lifestyle modification  Obstructive sleep apnea, on CPAP Stressed  compliance    Total encounter time more than 60 minutes  Greater than  50% was spent in counseling and coordination of care with the patient    Signed, Esmond Plants, M.D., Ph.D. [place], [place]

## 2020-02-20 ENCOUNTER — Encounter: Payer: Self-pay | Admitting: Dermatology

## 2020-02-23 ENCOUNTER — Other Ambulatory Visit
Admission: RE | Admit: 2020-02-23 | Discharge: 2020-02-23 | Disposition: A | Discharge status: Home / Self Care | Payer: Medicare Other | Source: Ambulatory Visit | Attending: Urology | Admitting: Urology

## 2020-02-23 DIAGNOSIS — C61 Malignant neoplasm of prostate: Secondary | ICD-10-CM | POA: Diagnosis present

## 2020-02-23 LAB — PSA: Prostatic Specific Antigen: 0.17 ng/mL (ref 0.00–4.00)

## 2020-02-24 ENCOUNTER — Encounter: Payer: Self-pay | Admitting: Urology

## 2020-02-24 ENCOUNTER — Other Ambulatory Visit: Payer: Medicare Other

## 2020-03-07 ENCOUNTER — Ambulatory Visit: Payer: Medicare Other | Admitting: Urology

## 2020-03-07 ENCOUNTER — Encounter: Payer: Self-pay | Admitting: Urology

## 2020-03-22 ENCOUNTER — Other Ambulatory Visit: Payer: Self-pay

## 2020-03-22 ENCOUNTER — Encounter: Payer: Self-pay | Admitting: Urology

## 2020-03-22 ENCOUNTER — Ambulatory Visit (INDEPENDENT_AMBULATORY_CARE_PROVIDER_SITE_OTHER): Payer: Medicare Other | Admitting: Urology

## 2020-03-22 VITALS — BP 115/74 | HR 103 | Ht 76.0 in | Wt 269.0 lb

## 2020-03-22 DIAGNOSIS — Z8546 Personal history of malignant neoplasm of prostate: Secondary | ICD-10-CM | POA: Diagnosis not present

## 2020-03-22 DIAGNOSIS — N4889 Other specified disorders of penis: Secondary | ICD-10-CM | POA: Diagnosis not present

## 2020-03-22 NOTE — Progress Notes (Signed)
03/22/2020 10:13 AM   Nicholas [name] 01-05-1946 [id number]  Referring provider: Rusty Aus, MD [facility] [place],  [place] [address]  Chief Complaint  Patient presents with  . Prostate Cancer    Urologic history: 1. Intermediate risk prostate cancer -Diagnosed 06/2012 PSA 6.8 -1/12 cores positive Gleason 3+4 adenocarcinoma involving 100% -Treated with CyberKnife radiation completed 10/2012  HPI: 75 y.o. male presents for prostate cancer follow-up.   No bothersome LUTS  Denies dysuria, gross hematuria  Denies flank, abdominal or pelvic pain  PSA drawn last month stable 0.17  His wife has Parkinson's currently not sexually active; he has noticed "penile shrinkage" and was inquiring if lack of sexual activity could cause this  PMH: Past Medical History:  Diagnosis Date  . A-fib ([place])   . Cancer ([place]) 10/25/2019   Prostate  . 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 Dysrhythmia   . Pulmonary embolism ([place])   . TIA (transient ischemic attack)     Surgical History: Past Surgical History:  Procedure Laterality Date  . APPENDECTOMY    . COLONOSCOPY WITH PROPOFOL N/A 11/23/2019   Procedure: COLONOSCOPY WITH PROPOFOL;  Surgeon: Toledo, Benay Pike, MD;  Location: [facility] ENDOSCOPY;  Service: Gastroenterology;  Laterality: N/A;  . cyber knife surgery    . PROSTATE SURGERY      Home Medications:  Allergies as of 03/22/2020      Reactions   Penicillins Other (See Comments)   Allopurinol Hives      Medication List       Accurate as of March 22, 2020 10:13 AM. If you have any questions, ask your nurse or doctor.        STOP taking these medications   ofloxacin 0.3 % OTIC solution Commonly known as: FLOXIN Stopped by: Abbie Sons, MD     TAKE these medications   aspirin EC 81 MG tablet Take 81 mg by mouth daily. Swallow whole.   atenolol 25 MG tablet Commonly known as: TENORMIN Take 12.5 mg by mouth 2  (two) times daily.   colchicine 0.6 MG tablet Take 0.6 mg by mouth as needed. What changed: Another medication with the same name was removed. Continue taking this medication, and follow the directions you see here. Changed by: Abbie Sons, MD   febuxostat 40 MG tablet Commonly known as: ULORIC Take 40 mg by mouth daily.   ketoconazole 2 % cream Commonly known as: NIZORAL Apply 1 application topically in the morning. qam to rash in groin   Xarelto 20 MG Tabs tablet Generic drug: rivaroxaban Take 20 mg by mouth daily.       Allergies:  Allergies  Allergen Reactions  . Penicillins Other (See Comments)  . Allopurinol Hives    Family History: No family history on file.  Social History:  reports that he has never smoked. He has never used smokeless tobacco. He reports that he does not drink alcohol and does not use drugs.   Physical Exam: BP 115/74   Pulse (!) 103   Ht 6\' 4"  (1.93 m)   Wt 269 lb (122 kg)   BMI 32.74 kg/m   Constitutional:  Alert, No acute distress. HEENT: [place] AT, moist mucus membranes.  Trachea midline, no masses. Cardiovascular: No clubbing, cyanosis, or edema. Respiratory: Normal respiratory effort, no increased work of breathing. Neurologic: Grossly intact, no focal deficits, moving all 4 extremities. Psychiatric: Normal mood and affect.   Assessment & Plan:  1.  History T1c intermediate risk prostate cancer  PSA remains low and stable  Follow-up 1 year with PSA  2. Penile atrophy  New problem  Discussed this would not be result of sexual inactivity however neurogenic ED can occur after radiation as well as age-related vascular ED and if oxygenated blood is not flowing through the penis atrophy can occur  Offered him a low-dose PDE 5 inhibitor to take daily which most likely would not reverse the atrophy which could potentially prevent it from getting worse.  He indicated he would think over and call back if he desires an  Rx   Abbie Sons, [place] [address] [place], [place] [address] [phone]

## 2020-04-26 ENCOUNTER — Encounter: Payer: Self-pay | Admitting: Urology

## 2020-04-26 ENCOUNTER — Ambulatory Visit (INDEPENDENT_AMBULATORY_CARE_PROVIDER_SITE_OTHER): Payer: Medicare Other | Admitting: Urology

## 2020-04-26 ENCOUNTER — Other Ambulatory Visit: Payer: Self-pay

## 2020-04-26 VITALS — BP 129/83 | HR 106 | Ht 76.0 in | Wt 260.0 lb

## 2020-04-26 DIAGNOSIS — R3129 Other microscopic hematuria: Secondary | ICD-10-CM

## 2020-04-26 DIAGNOSIS — R319 Hematuria, unspecified: Secondary | ICD-10-CM

## 2020-04-26 LAB — URINALYSIS, COMPLETE
Bilirubin, UA: NEGATIVE
Glucose, UA: NEGATIVE
Ketones, UA: NEGATIVE
Leukocytes,UA: NEGATIVE
Nitrite, UA: NEGATIVE
Specific Gravity, UA: >1.03 — ABNORMAL HIGH (ref 1.005–1.030)
Urobilinogen, Ur: 2 mg/dL — ABNORMAL HIGH (ref 0.2–1.0)
pH, UA: 5.5 (ref 5.0–7.5)

## 2020-04-26 LAB — MICROSCOPIC EXAMINATION
Bacteria, UA: NONE SEEN
Epithelial Cells (non renal): NONE SEEN /hpf (ref 0–10)

## 2020-04-26 NOTE — Progress Notes (Signed)
04/26/2020 3:14 PM   Nicholas [name] Jun 02, 1945 [id number]  Referring provider: Rusty Aus, MD [facility] [place],  [place] [address]  Chief Complaint  Patient presents with  . Hematuria     Urologic history: 1. Intermediate risk prostate cancer -Diagnosed 06/2012 PSA 6.8 -1/12 cores positive Gleason 3+4 adenocarcinoma involving 100% -Treated with CyberKnife radiation completed 10/2012   HPI: 75 y.o. male presents for evaluation of hematuria.   Last visit 03/22/2020  Routine PCP visit 04/13/2020 showed 10-50 RBC on UA  Was evaluated for right back pain which was worse with movement  Was treated for musculoskeletal pain with stretches and prednisone  Prior UA 02/2020 showed 4-10 RBC  Denies gross hematuria  No bothersome LUTS  On Xarelto   PMH: Past Medical History:  Diagnosis Date  . A-fib ([place])   . Cancer ([place]) 10/25/2019   Prostate  . 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 Dysrhythmia   . Pulmonary embolism ([place])   . TIA (transient ischemic attack)     Surgical History: Past Surgical History:  Procedure Laterality Date  . APPENDECTOMY    . COLONOSCOPY WITH PROPOFOL N/A 11/23/2019   Procedure: COLONOSCOPY WITH PROPOFOL;  Surgeon: [name], Nicholas Pike, MD;  Location: [facility] ENDOSCOPY;  Service: Gastroenterology;  Laterality: N/A;  . cyber knife surgery    . PROSTATE SURGERY      Home Medications:  Allergies as of 04/26/2020      Reactions   Penicillins Other (See Comments)   Allopurinol Hives      Medication List       Accurate as of April 26, 2020  3:14 PM. If you have any questions, ask your nurse or doctor.        aspirin EC 81 MG tablet Take 81 mg by mouth daily. Swallow whole.   atenolol 25 MG tablet Commonly known as: TENORMIN Take 12.5 mg by mouth 2 (two) times daily.   colchicine 0.6 MG tablet Take 0.6 mg by mouth as needed.   febuxostat 40 MG tablet Commonly known as:  ULORIC Take 40 mg by mouth daily.   ketoconazole 2 % cream Commonly known as: NIZORAL Apply 1 application topically in the morning. qam to rash in groin   Xarelto 20 MG Tabs tablet Generic drug: rivaroxaban Take 20 mg by mouth daily.       Allergies:  Allergies  Allergen Reactions  . Penicillins Other (See Comments)  . Allopurinol Hives    Family History: History reviewed. No pertinent family history.  Social History:  reports that he has never smoked. He has never used smokeless tobacco. He reports that he does not drink alcohol and does not use drugs.   Physical Exam: BP 129/83   Pulse (!) 106   Ht 6\' 4"  (1.93 m)   Wt 260 lb (117.9 kg)   BMI 31.65 kg/m   Constitutional:  Alert, No acute distress. HEENT: [place] AT, moist mucus membranes.  Trachea midline, no masses. Cardiovascular: No clubbing, cyanosis, or edema. Respiratory: Normal respiratory effort, no increased work of breathing. Neurologic: Grossly intact, no focal deficits, moving all 4 extremities. Psychiatric: Normal mood and affect.  Laboratory Data:  Urinalysis Dipstick trace blood Microscopy 3-10 RBC  Assessment & Plan:    1.  Microhematuria  AUA risk stratification: High  I discussed potential causes of hematuria with Nicholas [name] and his wife including the possibility of urologic malignancies.  We discussed the recommend evaluation for high risk hematuria  to include a CT urogram and cystoscopy  The procedures were described and he has elected to proceed  Order placed for CTU and cystoscopy was scheduled    Nicholas Sons, MD  [place] [address] [place], [place] [address] [phone]

## 2020-04-27 ENCOUNTER — Encounter: Payer: Self-pay | Admitting: *Deleted

## 2020-04-29 ENCOUNTER — Encounter: Payer: Self-pay | Admitting: Urology

## 2020-05-10 ENCOUNTER — Encounter: Payer: Self-pay | Admitting: Physical Therapy

## 2020-05-10 ENCOUNTER — Ambulatory Visit: Payer: Medicare Other | Attending: Internal Medicine

## 2020-05-10 ENCOUNTER — Other Ambulatory Visit: Payer: Self-pay

## 2020-05-10 DIAGNOSIS — R2681 Unsteadiness on feet: Secondary | ICD-10-CM | POA: Diagnosis present

## 2020-05-10 DIAGNOSIS — M545 Low back pain, unspecified: Secondary | ICD-10-CM | POA: Insufficient documentation

## 2020-05-10 DIAGNOSIS — R2689 Other abnormalities of gait and mobility: Secondary | ICD-10-CM | POA: Insufficient documentation

## 2020-05-10 DIAGNOSIS — M6281 Muscle weakness (generalized): Secondary | ICD-10-CM | POA: Diagnosis not present

## 2020-05-10 NOTE — Therapy (Signed)
[place] MAIN [facility] SERVICES [address] [place], [name], [address] Phone: [phone]   Fax:  [phone]  Physical Therapy Evaluation  Patient Details  Name: Nicholas [name] MRN: [id number] Date of Birth: [birth year] Referring Provider (PT): Rusty Aus, MD   Encounter Date: 05/10/2020   PT End of Session - 05/10/20 1230    Visit Number 1    Number of Visits 16    Date for PT Re-Evaluation 07/05/20    Authorization - Visit Number 1    Authorization - Number of Visits 10    PT Start Time 1696    PT Stop Time 7893    PT Time Calculation (min) 62 min    Equipment Utilized During Treatment Gait belt    Activity Tolerance Patient tolerated treatment well;No increased pain    Behavior During Therapy WFL for tasks assessed/performed           Past Medical History:  Diagnosis Date  . A-fib ([place])   . Cancer ([place]) 10/25/2019   Prostate  . 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 Dysrhythmia   . Pulmonary embolism ([place])   . TIA (transient ischemic attack)     Past Surgical History:  Procedure Laterality Date  . APPENDECTOMY    . COLONOSCOPY WITH PROPOFOL N/A 11/23/2019   Procedure: COLONOSCOPY WITH PROPOFOL;  Surgeon: Toledo, Benay Pike, MD;  Location: [facility] ENDOSCOPY;  Service: Gastroenterology;  Laterality: N/A;  . cyber knife surgery    . PROSTATE SURGERY      There were no vitals filed for this visit.    Subjective Assessment - 05/10/20 1051    Subjective Pt reports that his low back is not a big concern for him and that he has been improving. Pt states he believes his balance and hip/knee pain/stiffness is a bigger priority.    Pertinent History Pt is a pleasant 75 y.o. male referred to PT for LBP which occured early February. Pt has a PMH of prostate cx, TIA, chronic a-fib, and recent hematuria. Pt has pain with t/f from STS but feels better with walking. Pt states his LBP has been improving but his knees and hips have neem  giving him more pain. Pt's LBP is a strong ache that is on the R side but denies radicular symptoms. Pt reports exercises given by MD have been beneficial and improving his LBP symptoms. Currently in no pain during subjective. when LBP is flared up he reports 4-5/10 NPS. Worst pain is 5-6/10 NPS but pt is unable to explain what causes worst pain but reports possibly most pain due sitting for long periods of time. Prednisone has improved LBP. Pt reports his goals in therapy are to improve balance although he did not mention to Dr. Sabra [name] when referred to PT for his LBP. Also wants to improve his strength. He reports he requires objects to help support him when walking such as walls or furniture. Pt denies falls, but  spouse reports a fall last fall at home where he tripped over a blanket. Has reported near falls where he needed touching of an object to correct. Pt uses a stick now and then for balance but does report difficulty transferring from sturdy to unstardy surfaces (i.e. stepping over a stream) and walking stick helps.    Limitations Standing;Walking;House hold activities    How long can you sit comfortably? Unlimited    How long can you stand comfortably? Unlimited    How long can you walk  comfortably? ~1/2 mile    Patient Stated Goals Improve his balance, LE strength, hip/knee pain and stiffness    Currently in Pain? No/denies            Objective Measures:  Observation: pt has 3rd toenail on L foot that visually is abnormal (raised nail bed and observed redness, scab around nail), decreased lumbar lordosis noted   Vitals: BP 106/58, HR: 76-87 BPM (varies), SPO2% 98 Sensation: decreased sensation to light touch b/l LE's in anterior/posterior tibial/ankle (pt reports he has h/o peripheral neuropathy)  Strength: MMT Right Left  Hip flexion 5/5 5/5  Hip Abduction 5/5 5/5  Knee Extension  5/5 5/5  Hip ER 4/5 4/5  Hip IR 5/5 (pain in R lower back) 5/5  Hip Extension 4/5 4-/5    AROM:  Hip IR (measured in sitting): R 17 degrees (mild pain in R lower back), L 5 degrees Lumbar Flexion: limited 50 % (fingers to distal tib tub) Lumbar Extension: limited 50% Lumbar rotation R and L: limited bilaterally 25%, pt reports R lower back pain b/l Side bend R not painful, side bend L painful, normal AROM Hip flexion: R 112 degrees, L 110 degrees Hip extension: R 4 degrees, L 3 degrees  Palpation:  Equal iliac crest height (+) TTP R lumbar paraspinals and lat.   Hamstring Flexibility: L 63 degrees (hip flexion with knee extension) R 55 degrees (hip flexion with knee extension)  Special Tests: R hip (-) FADDIR, (+) hypomobile P/A mob L hip (-) FADDIR, (+) hypomobile P/A mob Held on Lumbar PAM testing as pt still awaiting results from urology regarding his hematuria  Functional Tests: 5x STS: 15.38 seconds from standard height chair Bridge x10, pt reports no pain on his back Balance:              -Modified CTSIB: ft together EO 30 sec (steady) , ft together EC 30 sec (steady), ft together EO on airex (sway) , ft together EC on airex posterior and L lean (sway)             -SLS L x 5 sec with min UE touch to bar then x15 sec (increased trunk sway and lean to bar)             -SLS R x 3 sec with min UE touch to bar then x 2-3 sec (increased trunk sway and lean to bar)  10 m walk test: .97 m/s  FOTO: 55 today, anticipate 69 by D/C     [facility] PT Assessment - 05/10/20 0001      Assessment   Medical Diagnosis LBP    Referring Provider (PT) Rusty Aus, MD    Onset Date/Surgical Date 05/10/20    Prior Therapy No      Balance Screen   Has the patient fallen in the past 6 months Yes    How many times? 1    Has the patient had a decrease in activity level because of a fear of falling?  No    Is the patient reluctant to leave their home because of a fear of falling?  No      Home Ecologist residence    Living Arrangements  Spouse/significant other      Prior Function   Level of Independence Independent           HEP: FOTO: 55 (anticipate 69 at DC) Access Code: [email or web address] URL: [email or web address] Date: 05/10/2020 Prepared by: Merdis Delay  Exercises  . Hooklying Hamstring Stretch with Strap - 1 x daily - 3 reps - 20 hold . Supine Bridge - 1 x daily - 2 sets - 10 reps . Sit to Stand with Arms Crossed - 1 x daily - 2 sets - 10 reps   Objective measurements completed on examination: See above findings.     PT Education - 05/10/20 1228    Education Details HEP, form/technique with exercise. Follow-up with PCP on third toe on L foot due to bloody toenail.    Person(s) Educated Patient    Methods Explanation;Demonstration;Tactile cues;Verbal cues;Handout    Comprehension Verbalized understanding;Returned demonstration            PT Short Term Goals - 05/10/20 1232      PT SHORT TERM GOAL #1   Title Patient will be independent in home exercise program to improve strength/mobility for better functional independence with ADLs.    Baseline 3/3: established HEP today. See medbridge access code    Time 4    Period Weeks    Status New    Target Date 06/07/20             PT Long Term Goals - 05/10/20 1233      PT LONG TERM GOAL #1   Title Patient will increase FOTO score equal to or greater than 69 to demonstrate statistically significant improvement in mobility and quality of life.    Baseline 3/3: 55    Time 8    Period Weeks    Status New    Target Date 07/05/20      PT LONG TERM GOAL #2   Title Patient (> 36 years old) will complete five times sit to stand test in < 12 seconds indicating an increased LE strength and improved balance.    Baseline 3/3: 15.38 m/s    Time 8    Period Weeks    Status New    Target Date 07/05/20      PT LONG TERM GOAL #3   Title Pt will improve hip extension AROM > 10 deg bilaterally to normalize reciprocal gait and balance.     Baseline 3/3: R/L 4 deg/3 deg    Time 8    Period Weeks    Status New    Target Date 07/05/20      PT LONG TERM GOAL #4   Title Pt will improve Berg score > 46 to display decreased risk of falls.    Baseline 3/3: Will assess upon MD approval    Time 8    Period Weeks    Status New    Target Date 07/05/20                  Plan - 05/10/20 1257    Clinical Impression Statement Pt is a pleasant 75 y.o. male referred to PT for Acute R sided LBP without sciatica. Pt presents with a PMH of A-fib, lumbar disc disease, prostate cancer, and TIA. Per MD visit on 2/04, pt's pain began on February 1st that is dull and achey and hurts with STS and eases with walking. Noted hematuria. Per urologist visit on 2/17, discussion of need for CT urogram and cytopscopy due to possibility of "urologic malignancies" scheduled for 3/15. Pt TTP along R lumbar paraspinals lat. Pt displayed increased pain with lumbar flexion and L lumbar lat flexion on R side. Pt grossly limited in lumbar flexion requiring bending of knees in order to touch floor. Via MMT pt  displays generalized LE weakness primiarly in hip extension and hip ER with concordant R LBP with resisted R hip ER/IR. Limited muscular flexibility in B hamstrings and hip IR's. 5xSTS time recorded at 15.38 seconds placing pt at increased risk for falls and demonstrates decreased LE strength for matched age range. 10 m gait speed recorded at 0.97 m/s so pt is not at increased risk of falls with walking on level surfaces. Noted decreased balance with eyes closed, narrow BOS, and on foam indicating impaired vestibuklar input with balance. Pt also appears to have significant scab underneath 3rd toenail on L foot. Pt educated to contact PCP for assessment to prevent future complications 2/2 medical history. Pt can continue to benefit from skilled PT treatment to improve noted impairments above to improve overall functional mobility.    Personal Factors and  Comorbidities Age;Comorbidity 3+;Past/Current Experience    Comorbidities A-fib, lumbar disc disease, prostate cancer, and  history of TIA.    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    Clinical Decision Making Moderate    Rehab Potential Good    PT Frequency 2x / week    PT Duration 8 weeks    PT Treatment/Interventions ADLs/Self Care Home Management;Biofeedback;Aquatic Therapy;Canalith Repostioning;Cryotherapy;Electrical Stimulation;Iontophoresis 4mg /ml Dexamethasone;Moist Heat;Traction;Ultrasound;Gait training;Stair training;DME Instruction;Functional mobility training;Therapeutic activities;Therapeutic exercise;Balance training;Neuromuscular re-education;Patient/family education;Manual techniques;Passive range of motion;Dry needling;Energy conservation;Vestibular;Joint Manipulations    PT Next Visit Plan Assess balance with Merrilee Jansky    PT Home Exercise Plan Access Code: [email or web address]    Consulted and Agree with Plan of Care Patient           Patient will benefit from skilled therapeutic intervention in order to improve the following deficits and impairments:  Abnormal gait,Decreased balance,Decreased range of motion,Decreased strength,Hypomobility,Impaired sensation,Pain,Improper body mechanics,Impaired flexibility,Decreased mobility  Visit Diagnosis: Muscle weakness (generalized)  Acute right-sided low back pain without sciatica     Problem List There are no problems to display for this patient.   Salem Caster. Fairly IV, PT, DPT Physical Therapist- [facility]  05/10/2020, 1:27 PM  [place] MAIN [facility] SERVICES [address] [place], [name], [address] Phone: [phone]   Fax:  [phone]  Name: MALCOM SELMER MRN:  [id number] Date of Birth: 10/08/1945

## 2020-05-14 ENCOUNTER — Ambulatory Visit: Payer: Medicare Other

## 2020-05-14 ENCOUNTER — Other Ambulatory Visit: Payer: Self-pay

## 2020-05-14 DIAGNOSIS — M6281 Muscle weakness (generalized): Secondary | ICD-10-CM

## 2020-05-14 DIAGNOSIS — M545 Low back pain, unspecified: Secondary | ICD-10-CM

## 2020-05-14 NOTE — Therapy (Signed)
[place] MAIN [facility] SERVICES [address] [place], [name], [address] Phone: [phone]   Fax:  [phone]  Physical Therapy Treatment  Patient Details  Name: Nicholas [name] MRN: [id number] Date of Birth: 08-May-1945 Referring Provider (PT): Rusty Aus, MD   Encounter Date: 05/14/2020   PT End of Session - 05/14/20 1204    Visit Number 2    Number of Visits 16    Date for PT Re-Evaluation 07/05/20    Authorization - Visit Number 2    Authorization - Number of Visits 10    PT Start Time 6226    PT Stop Time 1226    PT Time Calculation (min) 39 min    Equipment Utilized During Treatment Gait belt    Activity Tolerance Patient tolerated treatment well;No increased pain    Behavior During Therapy WFL for tasks assessed/performed           Past Medical History:  Diagnosis Date  . A-fib ([place])   . Cancer ([place]) 10/25/2019   Prostate  . 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 Dysrhythmia   . Pulmonary embolism ([place])   . TIA (transient ischemic attack)     Past Surgical History:  Procedure Laterality Date  . APPENDECTOMY    . COLONOSCOPY WITH PROPOFOL N/A 11/23/2019   Procedure: COLONOSCOPY WITH PROPOFOL;  Surgeon: Toledo, Benay Pike, MD;  Location: [facility] ENDOSCOPY;  Service: Gastroenterology;  Laterality: N/A;  . cyber knife surgery    . PROSTATE SURGERY      There were no vitals filed for this visit.   Subjective Assessment - 05/14/20 1151    Subjective Pt reports 2/10 in R low back. No falls or stumbles. Does report losing 10 lbs in 2 weeks but attributes it to having a tooth pulled and unable to eat as much and trying to reduce his snacking.    Pertinent History Pt is a pleasant 75 y.o. male referred to PT for LBP which occured early February. Pt has a PMH of prostate cx, TIA, chronic a-fib, and recent hematuria. Pt has pain with t/f from STS but feels better with walking. Pt states his LBP has been improving but his knees and  hips have neem giving him more pain. Pt's LBP is a strong ache that is on the R side but denies radicular symptoms. Pt reports exercises given by MD have been beneficial and improving his LBP symptoms. Currently in no pain during subjective. when LBP is flared up he reports 4-5/10 NPS. Worst pain is 5-6/10 NPS but pt is unable to explain what causes worst pain but reports possibly most pain due sitting for long periods of time. Prednisone has improved LBP. Pt reports his goals in therapy are to improve balance although he did not mention to Dr. Sabra [name] when referred to PT for his LBP. Also wants to improve his strength. He reports he requires objects to help support him when walking such as walls or furniture. Pt denies falls, but  spouse reports a fall last fall at home where he tripped over a blanket. Has reported near falls where he needed touching of an object to correct. Pt uses a stick now and then for balance but does report difficulty transferring from sturdy to unstardy surfaces (i.e. stepping over a stream) and walking stick helps.    Limitations Standing;Walking;House hold activities    How long can you sit comfortably? Unlimited    How long can you stand comfortably? Unlimited  How long can you walk comfortably? ~1/2 mile    Patient Stated Goals Improve his balance, LE strength, hip/knee pain and stiffness    Currently in Pain? Yes    Pain Score 4     Pain Location Back          There.ex:   Supine B hamstring and knee to chest stretch: 3x30 sec/LE   Lumbar trunk rotations with therapist overpressure at contra knee and shoulder.    Reports of LBP to 2/10 NPS after stretches    Hooklying bridges: 2x12, 5 sec holds. VC for form/technique.  Seated exercise ball reaches: 1x8, intermittent diagonal reaches to the L/R.   STS from mat table: 2x10 VC/TC's for form/technique. Reports of 5/10 L knee pain that improves with reps. Elevated mat table with further improvements in knee pain. Verbal  cuing for eccentric control with good carryover after cuing.     Neuro Re-Ed: Standing by balance bar  Tandem stance R/LLE: 3x30 sec. Intermittent use of SUE on bar for balance. Lat sway noted with both feet positioning.   SLS on R/LLE: 3x30 sec attempts. L>R difficulty. Able to maintain SLS on RLE for 15 sec and LLE for 10. Lat sway however with those times on each LE. CGA+1 throughout and therapist touch assist to correct sway. VC/TC's required for form/technique.       Pt had good carryover after cuing with form/technique of exercises. Most difficulty with balance exercises with narrow BOS requiring CGA+1 and SUE support.       PT Education - 05/14/20 1203    Education Details form/technique with exercise. Importance of HEP. Signs/sx of infection due to L foot 3rd toenail.    Person(s) Educated Patient    Methods Explanation;Tactile cues;Verbal cues;Demonstration    Comprehension Verbalized understanding;Returned demonstration            PT Short Term Goals - 05/10/20 1232      PT SHORT TERM GOAL #1   Title Patient will be independent in home exercise program to improve strength/mobility for better functional independence with ADLs.    Baseline 3/3: established HEP today. See medbridge access code    Time 4    Period Weeks    Status New    Target Date 06/07/20             PT Long Term Goals - 05/10/20 1233      PT LONG TERM GOAL #1   Title Patient will increase FOTO score equal to or greater than 69 to demonstrate statistically significant improvement in mobility and quality of life.    Baseline 3/3: 55    Time 8    Period Weeks    Status New    Target Date 07/05/20      PT LONG TERM GOAL #2   Title Patient (> 61 years old) will complete five times sit to stand test in < 12 seconds indicating an increased LE strength and improved balance.    Baseline 3/3: 15.38 m/s    Time 8    Period Weeks    Status New    Target Date 07/05/20      PT LONG TERM GOAL #3    Title Pt will improve hip extension AROM > 10 deg bilaterally to normalize reciprocal gait and balance.    Baseline 3/3: R/L 4 deg/3 deg    Time 8    Period Weeks    Status New    Target Date 07/05/20  PT LONG TERM GOAL #4   Title Pt will improve Berg score > 46 to display decreased risk of falls.    Baseline 3/3: Will assess upon MD approval    Time 8    Period Weeks    Status New    Target Date 07/05/20                 Plan - 05/14/20 1246    Clinical Impression Statement Today's session with focus on improving lumbar and LE flexibility and balance. Initial LBP reported at 4/10 NPS. After LE srtetching and lumbar mobility exercises, pt reported pain to 2/10 NPS. Pt displays most difficulty with narrow BOS and SLS with noted lat sway to R/L with intermittent SUE support and intermittent therapist touch to regain balance. Pt can continue to benefit from skilled PT treatment to improve LBP, LE strength, and dynamic balance to improve functional mobility.    Personal Factors and Comorbidities Age;Comorbidity 3+;Past/Current Experience    Comorbidities A-fib, lumbar disc disease, prostate cancer, and  history of TIA.    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    Rehab Potential Good    PT Frequency 2x / week    PT Duration 8 weeks    PT Treatment/Interventions ADLs/Self Care Home Management;Biofeedback;Aquatic Therapy;Canalith Repostioning;Cryotherapy;Electrical Stimulation;Iontophoresis 4mg /ml Dexamethasone;Moist Heat;Traction;Ultrasound;Gait training;Stair training;DME Instruction;Functional mobility training;Therapeutic activities;Therapeutic exercise;Balance training;Neuromuscular re-education;Patient/family education;Manual techniques;Passive range of motion;Dry needling;Energy  conservation;Vestibular;Joint Manipulations    PT Next Visit Plan PERFORM BERG    PT Home Exercise Plan Access Code: [email or web address]    Consulted and Agree with Plan of Care Patient           Patient will benefit from skilled therapeutic intervention in order to improve the following deficits and impairments:  Abnormal gait,Decreased balance,Decreased range of motion,Decreased strength,Hypomobility,Impaired sensation,Pain,Improper body mechanics,Impaired flexibility,Decreased mobility  Visit Diagnosis: Muscle weakness (generalized)  Acute right-sided low back pain without sciatica     Problem List There are no problems to display for this patient.   Salem Caster. Fairly IV, PT, DPT Physical Therapist- [facility]  05/14/2020, 12:53 PM  [place] MAIN [facility] SERVICES [address] [place], [name], [address] Phone: [phone]   Fax:  [phone]  Name: ARIEZ NEILAN MRN: [id number] Date of Birth: [birth year]

## 2020-05-17 ENCOUNTER — Ambulatory Visit: Payer: Medicare Other

## 2020-05-17 ENCOUNTER — Other Ambulatory Visit: Payer: Self-pay

## 2020-05-17 DIAGNOSIS — M6281 Muscle weakness (generalized): Secondary | ICD-10-CM

## 2020-05-17 DIAGNOSIS — M545 Low back pain, unspecified: Secondary | ICD-10-CM

## 2020-05-17 DIAGNOSIS — R2681 Unsteadiness on feet: Secondary | ICD-10-CM

## 2020-05-17 NOTE — Therapy (Signed)
[place] MAIN [facility] SERVICES [address] [place], [name], [address] Phone: [phone]   Fax:  [phone]  Physical Therapy Treatment  Patient Details  Name: Nicholas [name] MRN: [id number] Date of Birth: [birth year] Referring Provider (PT): Rusty Aus, MD   Encounter Date: 05/17/2020   PT End of Session - 05/17/20 1341    Visit Number 3    Number of Visits 16    Date for PT Re-Evaluation 07/05/20    Authorization - Visit Number 2    Authorization - Number of Visits 10    PT Start Time 5009    PT Stop Time 1231    PT Time Calculation (min) 45 min    Equipment Utilized During Treatment Gait belt    Activity Tolerance Patient tolerated treatment well;No increased pain    Behavior During Therapy WFL for tasks assessed/performed           Past Medical History:  Diagnosis Date  . A-fib ([place])   . Cancer ([place]) 10/25/2019   Prostate  . 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 Dysrhythmia   . Pulmonary embolism ([place])   . TIA (transient ischemic attack)     Past Surgical History:  Procedure Laterality Date  . APPENDECTOMY    . COLONOSCOPY WITH PROPOFOL N/A 11/23/2019   Procedure: COLONOSCOPY WITH PROPOFOL;  Surgeon: Toledo, Benay Pike, MD;  Location: [facility] ENDOSCOPY;  Service: Gastroenterology;  Laterality: N/A;  . cyber knife surgery    . PROSTATE SURGERY      There were no vitals filed for this visit.  TREATMENT  Subjective Assessment - 05/17/20 1149    Subjective Pt states pain is currently "less than 1/10" Pt states "I feel like it's better since I've been coming here."    Pertinent History Pt is a pleasant 75 y.o. male referred to PT for LBP which occured early February. Pt has a PMH of prostate cx, TIA, chronic a-fib, and recent hematuria. Pt has pain with t/f from STS but feels better with walking. Pt states his LBP has been improving but his knees and hips have neem giving him more pain. Pt's LBP is a strong ache that is  on the R side but denies radicular symptoms. Pt reports exercises given by MD have been beneficial and improving his LBP symptoms. Currently in no pain during subjective. when LBP is flared up he reports 4-5/10 NPS. Worst pain is 5-6/10 NPS but pt is unable to explain what causes worst pain but reports possibly most pain due sitting for long periods of time. Prednisone has improved LBP. Pt reports his goals in therapy are to improve balance although he did not mention to Dr. Sabra [name] when referred to PT for his LBP. Also wants to improve his strength. He reports he requires objects to help support him when walking such as walls or furniture. Pt denies falls, but  spouse reports a fall last fall at home where he tripped over a blanket. Has reported near falls where he needed touching of an object to correct. Pt uses a stick now and then for balance but does report difficulty transferring from sturdy to unstardy surfaces (i.e. stepping over a stream) and walking stick helps.    Limitations Standing;Walking;House hold activities    How long can you sit comfortably? Unlimited    How long can you stand comfortably? Unlimited    How long can you walk comfortably? ~1/2 mile    Patient Stated Goals Improve his balance,  LE strength, hip/knee pain and stiffness    Currently in Pain? Yes    Pain Score 1     Pain Location Back    Pain Orientation Right           TREATMENT  Seated hamstring stretch 2x30 sec B LEs; decreased mobility LLE compared to RLE  Seated piriformis stretch 2x30 sec B LEs; pt reports stretch feels good on back Physioball rollouts forward/backward- x10 with 3 second hold at end range Physioball rollouts side-to-side x10 with 3 second hold at end range STS from elevated mat table: 3x11; VC/TC's for form/technique.      Neuro Re-Ed: Standing by balance bar  Tandem stance BLEs: 4x30 sec each. Pt attempts without UE support but requires at least 3-finger support on bar. Frequent LOB that  requires up to min assist from PT and UE support to regain balance.             SLS BLES 3x30 sec attempts. Continues to demonstrate L>R difficulty.   Airex pad lateral walking in // bars 4x CGA provided, pt used BUE support to UUE support Standing cone taps in // bars - x multiple reps, CGA. Pt unable to perform without UE support. Cued to use at least UUE support as intermittent is too challenging.  Education provided throughout with VC/TC/demonstration facilitate movement at target joints, correct muscle activation, and technique with all exercises.   Assessment: Pt tolerates all exercises well today without reports of back or knee pain. Pt greatest difficulty is with SLB balance tasks, where pt with multiple instances of LOB requiring UE support and up to min assist x1 to regain balance. Pt will benefit from further skilled therapy to continue to improve pain, LE strength, and balance in order to increase QOL and safety with all activities.     PT Education - 05/17/20 1340    Education Details form/technique with balance exercises    Person(s) Educated Patient    Methods Explanation;Demonstration;Verbal cues;Tactile cues    Comprehension Verbalized understanding;Returned demonstration            PT Short Term Goals - 05/10/20 1232      PT SHORT TERM GOAL #1   Title Patient will be independent in home exercise program to improve strength/mobility for better functional independence with ADLs.    Baseline 3/3: established HEP today. See medbridge access code    Time 4    Period Weeks    Status New    Target Date 06/07/20             PT Long Term Goals - 05/10/20 1233      PT LONG TERM GOAL #1   Title Patient will increase FOTO score equal to or greater than 69 to demonstrate statistically significant improvement in mobility and quality of life.    Baseline 3/3: 55    Time 8    Period Weeks    Status New    Target Date 07/05/20      PT LONG TERM GOAL #2   Title Patient  (> 23 years old) will complete five times sit to stand test in < 12 seconds indicating an increased LE strength and improved balance.    Baseline 3/3: 15.38 m/s    Time 8    Period Weeks    Status New    Target Date 07/05/20      PT LONG TERM GOAL #3   Title Pt will improve hip extension AROM > 10 deg bilaterally  to normalize reciprocal gait and balance.    Baseline 3/3: R/L 4 deg/3 deg    Time 8    Period Weeks    Status New    Target Date 07/05/20      PT LONG TERM GOAL #4   Title Pt will improve Berg score > 46 to display decreased risk of falls.    Baseline 3/3: Will assess upon MD approval    Time 8    Period Weeks    Status New    Target Date 07/05/20            Plan - 05/17/20 1348    Clinical Impression Statement Pt tolerates all exercises well today without reports of back or knee pain. Pt greatest difficulty is with SLB balance tasks, where pt with multiple instances of LOB requiring UE support and up to min assist x1 to regain balance. Pt will benefit from further skilled therapy to continue to improve pain, LE strength, and balance in order to increase QOL and safety with all activities.    Personal Factors and Comorbidities Age;Comorbidity 3+;Past/Current Experience    Comorbidities A-fib, lumbar disc disease, prostate cancer, and  history of TIA.    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    Rehab Potential Good    PT Frequency 2x / week    PT Duration 8 weeks    PT Treatment/Interventions ADLs/Self Care Home Management;Biofeedback;Aquatic Therapy;Canalith Repostioning;Cryotherapy;Electrical Stimulation;Iontophoresis 4mg /ml Dexamethasone;Moist Heat;Traction;Ultrasound;Gait training;Stair training;DME Instruction;Functional mobility training;Therapeutic activities;Therapeutic  exercise;Balance training;Neuromuscular re-education;Patient/family education;Manual techniques;Passive range of motion;Dry needling;Energy conservation;Vestibular;Joint Manipulations    PT Next Visit Plan PERFORM BERG; dynamic balance activities    PT Home Exercise Plan Access Code: [email or web address]    Consulted and Agree with Plan of Care Patient           Patient will benefit from skilled therapeutic intervention in order to improve the following deficits and impairments:  Abnormal gait,Decreased balance,Decreased range of motion,Decreased strength,Hypomobility,Impaired sensation,Pain,Improper body mechanics,Impaired flexibility,Decreased mobility  Visit Diagnosis: Muscle weakness (generalized)  Unsteadiness on feet  Acute right-sided low back pain without sciatica     Problem List There are no problems to display for this patient.  Ricard Dillon PT, DPT 05/17/2020, 1:51 PM  [place] MAIN [facility] SERVICES [address] [place], [name], [address] Phone: [phone]   Fax:  [phone]  Name: Nicholas [name] MRN: [id number] Date of Birth: 04-09-1945

## 2020-05-21 ENCOUNTER — Other Ambulatory Visit: Payer: Self-pay

## 2020-05-21 ENCOUNTER — Ambulatory Visit: Payer: Medicare Other

## 2020-05-21 DIAGNOSIS — M6281 Muscle weakness (generalized): Secondary | ICD-10-CM | POA: Diagnosis not present

## 2020-05-21 DIAGNOSIS — R2681 Unsteadiness on feet: Secondary | ICD-10-CM

## 2020-05-21 DIAGNOSIS — M545 Low back pain, unspecified: Secondary | ICD-10-CM

## 2020-05-21 NOTE — Therapy (Signed)
[place] MAIN [facility] SERVICES [address] [place], [name], [address] Phone: [phone]   Fax:  [phone]  Physical Therapy Treatment  Patient Details  Name: Nicholas [name] MRN: [id number] Date of Birth: 04/10/1945 Referring Provider (PT): Rusty Aus, MD   Encounter Date: 05/21/2020   PT End of Session - 05/21/20 1656    Visit Number 4    Number of Visits 16    Date for PT Re-Evaluation 07/05/20    Authorization - Visit Number 2    Authorization - Number of Visits 10    PT Start Time 1146    PT Stop Time 1230    PT Time Calculation (min) 44 min    Equipment Utilized During Treatment Gait belt    Activity Tolerance Patient tolerated treatment well;No increased pain    Behavior During Therapy WFL for tasks assessed/performed           Past Medical History:  Diagnosis Date  . A-fib ([place])   . Cancer ([place]) 10/25/2019   Prostate  . 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 Dysrhythmia   . Pulmonary embolism ([place])   . TIA (transient ischemic attack)     Past Surgical History:  Procedure Laterality Date  . APPENDECTOMY    . COLONOSCOPY WITH PROPOFOL N/A 11/23/2019   Procedure: COLONOSCOPY WITH PROPOFOL;  Surgeon: Toledo, Benay Pike, MD;  Location: [facility] ENDOSCOPY;  Service: Gastroenterology;  Laterality: N/A;  . cyber knife surgery    . PROSTATE SURGERY      There were no vitals filed for this visit.   Subjective Assessment - 05/21/20 1151    Subjective Pt reports pain is still less than 1/10 today.  Pt reports he didn't do stretches this morning but that they help when he does do them.    Pertinent History Pt is a pleasant 75 y.o. male referred to PT for LBP which occured early February. Pt has a PMH of prostate cx, TIA, chronic a-fib, and recent hematuria. Pt has pain with t/f from STS but feels better with walking. Pt states his LBP has been improving but his knees and hips have neem giving him more pain. Pt's LBP is a  strong ache that is on the R side but denies radicular symptoms. Pt reports exercises given by MD have been beneficial and improving his LBP symptoms. Currently in no pain during subjective. when LBP is flared up he reports 4-5/10 NPS. Worst pain is 5-6/10 NPS but pt is unable to explain what causes worst pain but reports possibly most pain due sitting for long periods of time. Prednisone has improved LBP. Pt reports his goals in therapy are to improve balance although he did not mention to Dr. Sabra [name] when referred to PT for his LBP. Also wants to improve his strength. He reports he requires objects to help support him when walking such as walls or furniture. Pt denies falls, but  spouse reports a fall last fall at home where he tripped over a blanket. Has reported near falls where he needed touching of an object to correct. Pt uses a stick now and then for balance but does report difficulty transferring from sturdy to unstardy surfaces (i.e. stepping over a stream) and walking stick helps.    Limitations Standing;Walking;House hold activities    How long can you sit comfortably? Unlimited    How long can you stand comfortably? Unlimited    How long can you walk comfortably? ~1/2 mile  Patient Stated Goals Improve his balance, LE strength, hip/knee pain and stiffness    Currently in Pain? Yes    Pain Score --    Pain Location Back           TREATMENT  Therapeutic Exercise:  Physioball rollouts forward/backward- x10 with 3 second hold at end range  Physioball rollouts side-to-side x10 with 3 second hold at end range  Seated hamstring stretch 2x30 sec B LEs; VC/demonstration for technique  Seated FABER stretch 1x60 sec B LEs; VC/demonstration for technique  Standing hip extension 3x10, with BUE support on bar. Frequent VC/demonstration for correct exercise technique. Pt tends to compensate with forward lean.  Neuro Re-Ed: Standing by balance bar, CGA-min assist x1 provided for all of the  following exercises.  Chair placed behind pt for increased safety.  Airex pad standing feet together 2x30 sec  Airex pad standing feet together EC 4x30 sec; Multiple instances of posterior LOB where pt required up to min assist from PT and UE support on bar to regain balance.  SLB 4x30 sec BLES    Cone taps - 4x15 BLES, intermittent UE support. Decreased eccentric control without UE support.  Education provided throughout with VC/TC/demonstration facilitate movement at target joints, correct muscle activation, and technique with all exercises.    PT Short Term Goals - 05/10/20 1232      PT SHORT TERM GOAL #1   Title Patient will be independent in home exercise program to improve strength/mobility for better functional independence with ADLs.    Baseline 3/3: established HEP today. See medbridge access code    Time 4    Period Weeks    Status New    Target Date 06/07/20             PT Long Term Goals - 05/10/20 1233      PT LONG TERM GOAL #1   Title Patient will increase FOTO score equal to or greater than 69 to demonstrate statistically significant improvement in mobility and quality of life.    Baseline 3/3: 55    Time 8    Period Weeks    Status New    Target Date 07/05/20      PT LONG TERM GOAL #2   Title Patient (> 25 years old) will complete five times sit to stand test in < 12 seconds indicating an increased LE strength and improved balance.    Baseline 3/3: 15.38 m/s    Time 8    Period Weeks    Status New    Target Date 07/05/20      PT LONG TERM GOAL #3   Title Pt will improve hip extension AROM > 10 deg bilaterally to normalize reciprocal gait and balance.    Baseline 3/3: R/L 4 deg/3 deg    Time 8    Period Weeks    Status New    Target Date 07/05/20      PT LONG TERM GOAL #4   Title Pt will improve Berg score > 46 to display decreased risk of falls.    Baseline 3/3: Will assess upon MD approval    Time 8    Period Weeks    Status New    Target  Date 07/05/20             Plan - 05/21/20 1705    Clinical Impression Statement First part of session dedicated to LE stretches and gentle AROM for pt back mobility and continued pain modulation. Pt without  reports of pain with all exercises today. Pt has continued difficulty with SLB tasks and standing on compliant surface with EC. He has tendency to lose balance posteriorly requiring UE support on bar and up to min assist from PT to regain balance. Pt will benefit from further skilled therapy to improve LE mobility, back pain, and balance to improve QOL.    Personal Factors and Comorbidities Age;Comorbidity 3+;Past/Current Experience    Comorbidities A-fib, lumbar disc disease, prostate cancer, and  history of TIA.    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    Rehab Potential Good    PT Frequency 2x / week    PT Duration 8 weeks    PT Treatment/Interventions ADLs/Self Care Home Management;Biofeedback;Aquatic Therapy;Canalith Repostioning;Cryotherapy;Electrical Stimulation;Iontophoresis 4mg /ml Dexamethasone;Moist Heat;Traction;Ultrasound;Gait training;Stair training;DME Instruction;Functional mobility training;Therapeutic activities;Therapeutic exercise;Balance training;Neuromuscular re-education;Patient/family education;Manual techniques;Passive range of motion;Dry needling;Energy conservation;Vestibular;Joint Manipulations    PT Next Visit Plan PERFORM BERG; dynamic balance activities, LE stretching    PT Home Exercise Plan Access Code: [email or web address]    Consulted and Agree with Plan of Care Patient           Patient will benefit from skilled therapeutic intervention in order to improve the following deficits and impairments:  Abnormal gait,Decreased balance,Decreased range of motion,Decreased  strength,Hypomobility,Impaired sensation,Pain,Improper body mechanics,Impaired flexibility,Decreased mobility  Visit Diagnosis: Muscle weakness (generalized)  Unsteadiness on feet  Acute right-sided low back pain without sciatica   Problem List There are no problems to display for this patient.  Ricard Dillon PT, DPT 05/21/2020, 5:08 PM  [place] MAIN [facility] SERVICES [address] [place], [name], [address] Phone: [phone]   Fax:  [phone]  Name: Nicholas [name] MRN: [id number] Date of Birth: [birth year]

## 2020-05-22 ENCOUNTER — Other Ambulatory Visit: Payer: Self-pay

## 2020-05-22 ENCOUNTER — Ambulatory Visit
Admission: RE | Admit: 2020-05-22 | Discharge: 2020-05-22 | Disposition: A | Discharge status: Home / Self Care | Payer: Medicare Other | Source: Ambulatory Visit | Attending: Urology | Admitting: Urology

## 2020-05-22 DIAGNOSIS — R3129 Other microscopic hematuria: Secondary | ICD-10-CM | POA: Insufficient documentation

## 2020-05-22 HISTORY — DX: Unspecified malignant neoplasm of skin, unspecified: C44.90

## 2020-05-22 LAB — POCT I-STAT CREATININE: Creatinine, Ser: 1.1 mg/dL (ref 0.61–1.24)

## 2020-05-22 MED ORDER — IOHEXOL 300 MG/ML  SOLN
125.0000 mL | Freq: Once | INTRAMUSCULAR | Status: AC | PRN
  Administered 2020-05-22: 125 mL via INTRAVENOUS

## 2020-05-24 ENCOUNTER — Other Ambulatory Visit: Payer: Self-pay

## 2020-05-24 ENCOUNTER — Encounter: Payer: Self-pay | Admitting: Urology

## 2020-05-24 ENCOUNTER — Ambulatory Visit: Payer: Medicare Other

## 2020-05-24 ENCOUNTER — Ambulatory Visit (INDEPENDENT_AMBULATORY_CARE_PROVIDER_SITE_OTHER): Payer: Medicare Other | Admitting: Urology

## 2020-05-24 VITALS — BP 102/67 | HR 98 | Ht 76.0 in | Wt 262.0 lb

## 2020-05-24 DIAGNOSIS — M545 Low back pain, unspecified: Secondary | ICD-10-CM

## 2020-05-24 DIAGNOSIS — R2681 Unsteadiness on feet: Secondary | ICD-10-CM

## 2020-05-24 DIAGNOSIS — R3129 Other microscopic hematuria: Secondary | ICD-10-CM

## 2020-05-24 DIAGNOSIS — M6281 Muscle weakness (generalized): Secondary | ICD-10-CM

## 2020-05-24 NOTE — Progress Notes (Signed)
   05/24/20  CC:  Chief Complaint  Patient presents with  . Cysto    Urologic history: 1.Intermediate risk prostate cancer -Diagnosed 06/2012 PSA 6.8 -1/12 cores positive Gleason 3+4 adenocarcinoma involving 100% -Treated with CyberKnife radiation completed 10/2012   HPI: Refer to my prior note 04/26/2020  Blood pressure 102/67, pulse 98, height 6\' 4"  (1.93 m), weight 262 lb (118.8 kg). NED. A&Ox3.   No respiratory distress   Abd soft, NT, ND Normal phallus with bilateral descended testicles  Imaging: CTU 05/22/2020 with bilateral renal cysts and 4 mm nonobstructing left renal calculus   Cystoscopy Procedure Note  Patient identification was confirmed, informed consent was obtained, and patient was prepped using Betadine solution.  Lidocaine jelly was administered per urethral meatus.     Pre-Procedure: - Inspection reveals a normal caliber urethral meatus.  Procedure: The flexible cystoscope was introduced without difficulty - No urethral strictures/lesions are present. - Moderate lateral lobe enlargement, pale mucosa and hypervascularity  - Normal bladder neck - Bilateral ureteral orifices identified - Bladder mucosa  reveals no ulcers, tumors, or lesions - No bladder stones -Mild trabeculation  Retroflexion shows no abnormalities   Post-Procedure: - Patient tolerated the procedure well   Assessment/ Plan:  Microhematuria most likely prostatic in origin  No significant upper tract abnormalities, bilateral renal cysts and small, nonobstructing renal calculus  Urine cytology sent  Follow-up as scheduled   Abbie Sons, MD

## 2020-05-24 NOTE — Therapy (Signed)
[place] MAIN [facility] SERVICES [address] [place], [name], [address] Phone: [phone]   Fax:  [phone]  Physical Therapy Treatment  Patient Details  Name: Nicholas [name] MRN: [id number] Date of Birth: 24-Jul-1945 Referring Provider (PT): Rusty Aus, MD   Encounter Date: 05/24/2020   PT End of Session - 05/24/20 1419    Visit Number 6    Number of Visits 16    Date for PT Re-Evaluation 07/05/20    Authorization - Visit Number 2    Authorization - Number of Visits 10    PT Start Time 2355    PT Stop Time 1230    PT Time Calculation (min) 42 min    Equipment Utilized During Treatment Gait belt    Activity Tolerance Patient tolerated treatment well;No increased pain    Behavior During Therapy WFL for tasks assessed/performed           Past Medical History:  Diagnosis Date  . A-fib ([place])   . Cancer ([place]) 10/25/2019   Prostate  . 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 Dysrhythmia   . Pulmonary embolism ([place])   . Skin cancer   . TIA (transient ischemic attack)     Past Surgical History:  Procedure Laterality Date  . APPENDECTOMY    . COLONOSCOPY WITH PROPOFOL N/A 11/23/2019   Procedure: COLONOSCOPY WITH PROPOFOL;  Surgeon: Toledo, Benay Pike, MD;  Location: [facility] ENDOSCOPY;  Service: Gastroenterology;  Laterality: N/A;  . cyber knife surgery    . PROSTATE SURGERY      There were no vitals filed for this visit.   Subjective Assessment - 05/24/20 1417    Subjective Pt reports back pain is currently 3/10. Pt reports he walked for 20 min prior to therapy.    Pertinent History Pt is a pleasant 75 y.o. male referred to PT for LBP which occured early February. Pt has a PMH of prostate cx, TIA, chronic a-fib, and recent hematuria. Pt has pain with t/f from STS but feels better with walking. Pt states his LBP has been improving but his knees and hips have neem giving him more pain. Pt's LBP is a strong ache that is on the R side  but denies radicular symptoms. Pt reports exercises given by MD have been beneficial and improving his LBP symptoms. Currently in no pain during subjective. when LBP is flared up he reports 4-5/10 NPS. Worst pain is 5-6/10 NPS but pt is unable to explain what causes worst pain but reports possibly most pain due sitting for long periods of time. Prednisone has improved LBP. Pt reports his goals in therapy are to improve balance although he did not mention to Dr. Sabra [name] when referred to PT for his LBP. Also wants to improve his strength. He reports he requires objects to help support him when walking such as walls or furniture. Pt denies falls, but  spouse reports a fall last fall at home where he tripped over a blanket. Has reported near falls where he needed touching of an object to correct. Pt uses a stick now and then for balance but does report difficulty transferring from sturdy to unstardy surfaces (i.e. stepping over a stream) and walking stick helps.    Limitations Standing;Walking;House hold activities    How long can you sit comfortably? Unlimited    How long can you stand comfortably? Unlimited    How long can you walk comfortably? ~1/2 mile    Patient Stated Goals Improve his  balance, LE strength, hip/knee pain and stiffness    Currently in Pain? Yes    Pain Score 3     Pain Location Back          TREATMENT   Therex:  Supine lower trunk rotations 2x20; pt reports his back "is feeling better already"  Supine hamstring curls/Knee to chest with LEs on ball 2x15   Supine bridges with adductor ball squeeze 1x11, 2x10 with TA contraction; pt rates exercise "medium"    Stability ball rollouts forward/backward 10x with 2-3 sec hold   Stability ball rollouts side-to-side 10x each with 2-3 sec hold   Neuro Re-Ed:  Airex pad standing NBOS no UE support x several min B LES; pt rates exercise as "easy-medium," intermittent UE support used.  Semi tandem on airex x several minutes BLEs;  intermittent UE support used    Plan - 05/24/20 1419    Clinical Impression Statement Pt reports improvement with back sx with gentle mobility exercises for his back, such as lower trunk rotations, supine hamstring curls/knee to chest and stability ball rollouts. He shows progress with static balance exercises on airex pad with improved postural stability. Pt will continue to benefit from further skilled therapy to continue to improve back pain, LE strength and balance to improve QOL.    Personal Factors and Comorbidities Age;Comorbidity 3+;Past/Current Experience    Comorbidities A-fib, lumbar disc disease, prostate cancer, and  history of TIA.    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    Rehab Potential Good    PT Frequency 2x / week    PT Duration 8 weeks    PT Treatment/Interventions ADLs/Self Care Home Management;Biofeedback;Aquatic Therapy;Canalith Repostioning;Cryotherapy;Electrical Stimulation;Iontophoresis 4mg /ml Dexamethasone;Moist Heat;Traction;Ultrasound;Gait training;Stair training;DME Instruction;Functional mobility training;Therapeutic activities;Therapeutic exercise;Balance training;Neuromuscular re-education;Patient/family education;Manual techniques;Passive range of motion;Dry needling;Energy conservation;Vestibular;Joint Manipulations    PT Next Visit Plan PERFORM BERG; dynamic balance activities, LE stretching; progress balance exercises    PT Home Exercise Plan Access Code: [email or web address]    Consulted and Agree with Plan of Care Patient              PT Education - 05/24/20 1418    Education Details technique with supine hamstring curls with ball/knee to chest    Person(s) Educated Patient    Methods Explanation;Tactile cues;Verbal cues    Comprehension Verbalized understanding;Returned  demonstration            PT Short Term Goals - 05/10/20 1232      PT SHORT TERM GOAL #1   Title Patient will be independent in home exercise program to improve strength/mobility for better functional independence with ADLs.    Baseline 3/3: established HEP today. See medbridge access code    Time 4    Period Weeks    Status New    Target Date 06/07/20             PT Long Term Goals - 05/10/20 1233      PT LONG TERM GOAL #1   Title Patient will increase FOTO score equal to or greater than 69 to demonstrate statistically significant improvement in mobility and quality of life.    Baseline 3/3: 55    Time 8    Period Weeks    Status New    Target Date 07/05/20      PT LONG TERM GOAL #2   Title Patient (> 24 years old) will complete five times sit  to stand test in < 12 seconds indicating an increased LE strength and improved balance.    Baseline 3/3: 15.38 m/s    Time 8    Period Weeks    Status New    Target Date 07/05/20      PT LONG TERM GOAL #3   Title Pt will improve hip extension AROM > 10 deg bilaterally to normalize reciprocal gait and balance.    Baseline 3/3: R/L 4 deg/3 deg    Time 8    Period Weeks    Status New    Target Date 07/05/20      PT LONG TERM GOAL #4   Title Pt will improve Berg score > 46 to display decreased risk of falls.    Baseline 3/3: Will assess upon MD approval    Time 8    Period Weeks    Status New    Target Date 07/05/20            Patient will benefit from skilled therapeutic intervention in order to improve the following deficits and impairments:  Abnormal gait,Decreased balance,Decreased range of motion,Decreased strength,Hypomobility,Impaired sensation,Pain,Improper body mechanics,Impaired flexibility,Decreased mobility  Visit Diagnosis: Acute right-sided low back pain without sciatica  Muscle weakness (generalized)  Unsteadiness on feet     Problem List There are no problems to display for this  patient.  Ricard Dillon PT, DPT 05/24/2020, 2:30 PM  [place] MAIN [facility] SERVICES [address] [place], [name], [address] Phone: [phone]   Fax:  [phone]  Name: Nicholas [name] MRN: [id number] Date of Birth: Mar 23, 1945

## 2020-05-28 ENCOUNTER — Ambulatory Visit: Payer: Medicare Other

## 2020-05-28 ENCOUNTER — Other Ambulatory Visit: Payer: Self-pay

## 2020-05-28 DIAGNOSIS — M6281 Muscle weakness (generalized): Secondary | ICD-10-CM

## 2020-05-28 DIAGNOSIS — M545 Low back pain, unspecified: Secondary | ICD-10-CM

## 2020-05-28 LAB — CYTOLOGY - NON PAP

## 2020-05-28 NOTE — Therapy (Signed)
[place] MAIN [facility] SERVICES [address] [place], [name], [address] Phone: [phone]   Fax:  [phone]  Physical Therapy Treatment  Patient Details  Name: Nicholas [name] MRN: [id number] Date of Birth: [birth year] Referring Provider (PT): Rusty Aus, MD   Encounter Date: 05/28/2020   PT End of Session - 05/28/20 1201    Visit Number 7    Number of Visits 16    Date for PT Re-Evaluation 07/05/20    Authorization - Visit Number 2    Authorization - Number of Visits 10    PT Start Time 1020    PT Stop Time 1100    PT Time Calculation (min) 40 min    Equipment Utilized During Treatment Gait belt    Activity Tolerance Patient tolerated treatment well    Behavior During Therapy [facility] for tasks assessed/performed           Past Medical History:  Diagnosis Date  . A-fib ([place])   . Cancer ([place]) 10/25/2019   Prostate  . 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 Dysrhythmia   . Pulmonary embolism ([place])   . Skin cancer   . TIA (transient ischemic attack)     Past Surgical History:  Procedure Laterality Date  . APPENDECTOMY    . COLONOSCOPY WITH PROPOFOL N/A 11/23/2019   Procedure: COLONOSCOPY WITH PROPOFOL;  Surgeon: Toledo, Benay Pike, MD;  Location: [facility] ENDOSCOPY;  Service: Gastroenterology;  Laterality: N/A;  . cyber knife surgery    . PROSTATE SURGERY      There were no vitals filed for this visit.   Subjective Assessment - 05/28/20 1022    Subjective Pt reports no back pain currently. Pt reports stiffness currently.    Pertinent History Pt is a pleasant 75 y.o. male referred to PT for LBP which occured early February. Pt has a PMH of prostate cx, TIA, chronic a-fib, and recent hematuria. Pt has pain with t/f from STS but feels better with walking. Pt states his LBP has been improving but his knees and hips have neem giving him more pain. Pt's LBP is a strong ache that is on the R side but denies radicular symptoms. Pt reports  exercises given by MD have been beneficial and improving his LBP symptoms. Currently in no pain during subjective. when LBP is flared up he reports 4-5/10 NPS. Worst pain is 5-6/10 NPS but pt is unable to explain what causes worst pain but reports possibly most pain due sitting for long periods of time. Prednisone has improved LBP. Pt reports his goals in therapy are to improve balance although he did not mention to Dr. Sabra [name] when referred to PT for his LBP. Also wants to improve his strength. He reports he requires objects to help support him when walking such as walls or furniture. Pt denies falls, but  spouse reports a fall last fall at home where he tripped over a blanket. Has reported near falls where he needed touching of an object to correct. Pt uses a stick now and then for balance but does report difficulty transferring from sturdy to unstardy surfaces (i.e. stepping over a stream) and walking stick helps.    Limitations Standing;Walking;House hold activities    How long can you sit comfortably? Unlimited    How long can you stand comfortably? Unlimited    How long can you walk comfortably? ~1/2 mile    Patient Stated Goals Improve his balance, LE strength, hip/knee pain and stiffness  Currently in Pain? No/denies             TREATMENT    Therex:   Supine lower trunk rotations 2x20; pt reports feeling it more on R side  Knee to chest individual LEs 2x10 each   TA contraction with posterior pelvic tilt 12x   Dead bug 2x10; pt able to self-correct technique; pt reports back feels "pretty good"   Supine piriformis stretch 2x30 sec each side ; pt reports L side feels tighter   Seated hamstring stretch 2x30 sec B LES   Stability ball rollouts forward/backward 10x with 2-3 sec hold      Access Code: [email or web address] URL: [email or web address] Date: 05/28/2020 Prepared by: Ricard Dillon  Exercises Supine Lower Trunk Rotation - 2 x daily - 7 x weekly - 2 sets - 20  reps Supine Bridge with Gluteal Set and Spinal Articulation - 1 x daily - 4 x weekly - 3 sets - 10 reps Supine Single Knee to Chest Stretch - 1 x daily - 4 x weekly - 2 sets - 10 reps - 2 hold       PT Education - 05/28/20 1203    Education Details Technique with deadbug    Person(s) Educated Patient    Methods Explanation;Tactile cues;Verbal cues    Comprehension Verbalized understanding;Returned demonstration            PT Short Term Goals - 05/10/20 1232      PT SHORT TERM GOAL #1   Title Patient will be independent in home exercise program to improve strength/mobility for better functional independence with ADLs.    Baseline 3/3: established HEP today. See medbridge access code    Time 4    Period Weeks    Status New    Target Date 06/07/20             PT Long Term Goals - 05/10/20 1233      PT LONG TERM GOAL #1   Title Patient will increase FOTO score equal to or greater than 69 to demonstrate statistically significant improvement in mobility and quality of life.    Baseline 3/3: 55    Time 8    Period Weeks    Status New    Target Date 07/05/20      PT LONG TERM GOAL #2   Title Patient (> 34 years old) will complete five times sit to stand test in < 12 seconds indicating an increased LE strength and improved balance.    Baseline 3/3: 15.38 m/s    Time 8    Period Weeks    Status New    Target Date 07/05/20      PT LONG TERM GOAL #3   Title Pt will improve hip extension AROM > 10 deg bilaterally to normalize reciprocal gait and balance.    Baseline 3/3: R/L 4 deg/3 deg    Time 8    Period Weeks    Status New    Target Date 07/05/20      PT LONG TERM GOAL #4   Title Pt will improve Berg score > 46 to display decreased risk of falls.    Baseline 3/3: Will assess upon MD approval    Time 8    Period Weeks    Status New    Target Date 07/05/20                 Plan - 05/28/20 1203    Clinical Impression Statement Provided pt with additional  HEP (see note). Instructed pt to perform gentle ROM exercises such as lower trunk rotations at home in the am to address low back stiffness. Pt will continue to benefit from further skilled therapy to improve back pain, mobility, and LE strength to increase ease with all functional mobility.    Personal Factors and Comorbidities Age;Comorbidity 3+;Past/Current Experience    Comorbidities A-fib, lumbar disc disease, prostate cancer, and  history of TIA.    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    Rehab Potential Good    PT Frequency 2x / week    PT Duration 8 weeks    PT Treatment/Interventions ADLs/Self Care Home Management;Biofeedback;Aquatic Therapy;Canalith Repostioning;Cryotherapy;Electrical Stimulation;Iontophoresis 4mg /ml Dexamethasone;Moist Heat;Traction;Ultrasound;Gait training;Stair training;DME Instruction;Functional mobility training;Therapeutic activities;Therapeutic exercise;Balance training;Neuromuscular re-education;Patient/family education;Manual techniques;Passive range of motion;Dry needling;Energy conservation;Vestibular;Joint Manipulations    PT Next Visit Plan PERFORM BERG; dynamic balance activities, LE stretching; progress balance exercises; progress deadbug    PT Home Exercise Plan Access Code: [email or web address]    Consulted and Agree with Plan of Care Patient           Patient will benefit from skilled therapeutic intervention in order to improve the following deficits and impairments:  Abnormal gait,Decreased balance,Decreased range of motion,Decreased strength,Hypomobility,Impaired sensation,Pain,Improper body mechanics,Impaired flexibility,Decreased mobility  Visit Diagnosis: Acute right-sided low back pain without sciatica  Muscle weakness (generalized)     Problem List There are no  problems to display for this patient. Ricard Dillon PT, DPT 05/28/2020, 12:07 PM  [place] MAIN [facility] SERVICES [address] [place], [name], [address] Phone: [phone]   Fax:  [phone]  Name: RUHAN BORAK MRN: [id number] Date of Birth: 1946/01/17

## 2020-05-30 ENCOUNTER — Encounter: Payer: Self-pay | Admitting: *Deleted

## 2020-05-31 ENCOUNTER — Ambulatory Visit: Payer: Medicare Other

## 2020-05-31 ENCOUNTER — Other Ambulatory Visit: Payer: Self-pay

## 2020-05-31 DIAGNOSIS — M6281 Muscle weakness (generalized): Secondary | ICD-10-CM | POA: Diagnosis not present

## 2020-05-31 DIAGNOSIS — M545 Low back pain, unspecified: Secondary | ICD-10-CM

## 2020-05-31 DIAGNOSIS — R2689 Other abnormalities of gait and mobility: Secondary | ICD-10-CM

## 2020-05-31 NOTE — Therapy (Signed)
[place] MAIN [facility] SERVICES [address] [place], [name], [address] Phone: [phone]   Fax:  [phone]  Physical Therapy Treatment  Patient Details  Name: Nicholas [name] MRN: [id number] Date of Birth: 08/13/1945 Referring Provider (PT): Nicholas Aus, MD   Encounter Date: 05/31/2020   PT End of Session - 05/31/20 1700    Visit Number 8    Number of Visits 16    Date for PT Re-Evaluation 07/05/20    Authorization - Visit Number 2    Authorization - Number of Visits 10    PT Start Time 5400    PT Stop Time 1230    PT Time Calculation (min) 42 min    Equipment Utilized During Treatment Gait belt    Activity Tolerance Patient tolerated treatment well;[name] increased pain    Behavior During Therapy WFL for tasks assessed/performed           Past Medical History:  Diagnosis Date  . A-fib ([place])   . Cancer ([place]) 10/25/2019   Prostate  . 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 Dysrhythmia   . Pulmonary embolism ([place])   . Skin cancer   . TIA (transient ischemic attack)     Past Surgical History:  Procedure Laterality Date  . APPENDECTOMY    . COLONOSCOPY WITH PROPOFOL N/A 11/23/2019   Procedure: COLONOSCOPY WITH PROPOFOL;  Surgeon: [name], Nicholas Pike, MD;  Location: [facility] ENDOSCOPY;  Service: Gastroenterology;  Laterality: N/A;  . cyber knife surgery    . PROSTATE SURGERY      There were [name] vitals filed for this visit.   Subjective Assessment - 05/31/20 1658    Subjective Pt reports back pain when trying to wipe when toileting with spinal rotation with flexion.  Pt says pain gets up to 6/10. Pt currently rates pain level at 0/10.    Pertinent History Pt is a pleasant 75 y.o. male referred to PT for LBP which occured early February. Pt has a PMH of prostate cx, TIA, chronic a-fib, and recent hematuria. Pt has pain with t/f from STS but feels better with walking. Pt states his LBP has been improving but his knees and hips have neem  giving him more pain. Pt's LBP is a strong ache that is on the R side but denies radicular symptoms. Pt reports exercises given by MD have been beneficial and improving his LBP symptoms. Currently in [name] pain during subjective. when LBP is flared up he reports 4-5/10 NPS. Worst pain is 5-6/10 NPS but pt is unable to explain what causes worst pain but reports possibly most pain due sitting for long periods of time. Prednisone has improved LBP. Pt reports his goals in therapy are to improve balance although he did not mention to Dr. Sabra [name] when referred to PT for his LBP. Also wants to improve his strength. He reports he requires objects to help support him when walking such as walls or furniture. Pt denies falls, but  spouse reports a fall last fall at home where he tripped over a blanket. Has reported near falls where he needed touching of an object to correct. Pt uses a stick now and then for balance but does report difficulty transferring from sturdy to unstardy surfaces (i.e. stepping over a stream) and walking stick helps.    Limitations Standing;Walking;House hold activities    How long can you sit comfortably? Unlimited    How long can you stand comfortably? Unlimited    How long can  you walk comfortably? ~1/2 mile    Patient Stated Goals Improve his balance, LE strength, hip/knee pain and stiffness    Currently in Pain? [name]/denies          TREATMENT  Assesssment spinal mobility: overpressure applied Standing and seated thoracic rotation with extension (B) 10 x per side Standing lateral flexion 10x each side Pt is reports pain with spinal flexion with rotation to R   Therex:   Supine lower trunk rotations 2x20   Glute bridges 2x10; VC for technique  Open book supine 15x B, frequent VC/TC and demonstration for technique; pt with difficult performing this version of exercise  Open book side-lying 2x15 B; VC/TC and demonstration for technique  Knee to chest with LEs on physioball - 2x15  VC for technique  Dead bug with marching 2x12; VC for technique  Dead bug with marching and alternating arms 2x12; VC/TC for technique  Access Code: [email or web address] URL: [email or web address] Date: 05/31/2020 Prepared by: Nicholas [name]  Exercises Side Lying Open Book - 1 x daily - 7 x weekly - 2 sets - 10 reps  Education provided throughout in the form of demonstration, VC/TC and a handout to facilitate movement at target joints and for correct muscle activation with exercises.     PT Education - 05/31/20 1700    Education Details exercise technique with open book, body mechanics    Person(s) Educated Patient    Methods Explanation;Demonstration;Tactile cues;Verbal cues;Handout    Comprehension Verbalized understanding;Returned demonstration            PT Short Term Goals - 05/10/20 1232      PT SHORT TERM GOAL #1   Title Patient will be independent in home exercise program to improve strength/mobility for better functional independence with ADLs.    Baseline 3/3: established HEP today. See medbridge access code    Time 4    Period Weeks    Status New    Target Date 06/07/20             PT Long Term Goals - 05/10/20 1233      PT LONG TERM GOAL #1   Title Patient will increase FOTO score equal to or greater than 69 to demonstrate statistically significant improvement in mobility and quality of life.    Baseline 3/3: 55    Time 8    Period Weeks    Status New    Target Date 07/05/20      PT LONG TERM GOAL #2   Title Patient (> 47 years old) will complete five times sit to stand test in < 12 seconds indicating an increased LE strength and improved balance.    Baseline 3/3: 15.38 m/s    Time 8    Period Weeks    Status New    Target Date 07/05/20      PT LONG TERM GOAL #3   Title Pt will improve hip extension AROM > 10 deg bilaterally to normalize reciprocal gait and balance.    Baseline 3/3: R/L 4 deg/3 deg    Time 8    Period Weeks    Status New     Target Date 07/05/20      PT LONG TERM GOAL #4   Title Pt will improve Berg score > 46 to display decreased risk of falls.    Baseline 3/3: Will assess upon MD approval    Time 8    Period Weeks    Status New    Target Date 07/05/20  Plan - 05/31/20 1700    Clinical Impression Statement Provided pt with additional exercise for HEP to promote spinal extension with rotation as pt was found to have limited spinal extension mobility with further assesment. When pt performed open book exercise (side-lying) he showed greater mobility with L thoracic rotation and extension compared to R. The pt will continue to benefit from further skilled PT to improve pain, mobility and LE strength to increase QOL.    Personal Factors and Comorbidities Age;Comorbidity 3+;Past/Current Experience    Comorbidities A-fib, lumbar disc disease, prostate cancer, and  history of TIA.    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    Rehab Potential Good    PT Frequency 2x / week    PT Duration 8 weeks    PT Treatment/Interventions ADLs/Self Care Home Management;Biofeedback;Aquatic Therapy;Canalith Repostioning;Cryotherapy;Electrical Stimulation;Iontophoresis 4mg /ml Dexamethasone;Moist Heat;Traction;Ultrasound;Gait training;Stair training;DME Instruction;Functional mobility training;Therapeutic activities;Therapeutic exercise;Balance training;Neuromuscular re-education;Patient/family education;Manual techniques;Passive range of motion;Dry needling;Energy conservation;Vestibular;Joint Manipulations    PT Next Visit Plan PERFORM BERG; dynamic balance activities, LE stretching; progress balance exercises; progress deadbug, static balance exercises    PT Home Exercise Plan Access Code: [email or web address]    Consulted and Agree with Plan of Care  Patient           Patient will benefit from skilled therapeutic intervention in order to improve the following deficits and impairments:  Abnormal gait,Decreased balance,Decreased range of motion,Decreased strength,Hypomobility,Impaired sensation,Pain,Improper body mechanics,Impaired flexibility,Decreased mobility  Visit Diagnosis: Muscle weakness (generalized)  Acute right-sided low back pain without sciatica  Other abnormalities of gait and mobility   Problem List There are [name] problems to display for this patient.  Nicholas [name] PT, DPT 05/31/2020, 5:08 PM  [place] MAIN [facility] SERVICES [address] [place], [name], [address] Phone: [phone]   Fax:  [phone]  Name: Nicholas [name] MRN: [id number] Date of Birth: [birth year]

## 2020-06-04 ENCOUNTER — Other Ambulatory Visit: Payer: Self-pay

## 2020-06-04 ENCOUNTER — Ambulatory Visit: Payer: Medicare Other

## 2020-06-04 DIAGNOSIS — R2681 Unsteadiness on feet: Secondary | ICD-10-CM

## 2020-06-04 DIAGNOSIS — M6281 Muscle weakness (generalized): Secondary | ICD-10-CM

## 2020-06-04 NOTE — Therapy (Signed)
[place] MAIN [facility] SERVICES [address] [place], [name], [address] Phone: [phone]   Fax:  [phone]  Physical Therapy Treatment  Patient Details  Name: Nicholas [name] MRN: [id number] Date of Birth: [birth year] Referring Provider (PT): Rusty Aus, MD   Encounter Date: 06/04/2020   PT End of Session - 06/04/20 1254    Visit Number 9    Number of Visits 16    Date for PT Re-Evaluation 07/05/20    Authorization - Visit Number 2    Authorization - Number of Visits 10    PT Start Time 1146    PT Stop Time 1230    PT Time Calculation (min) 44 min    Equipment Utilized During Treatment Gait belt    Activity Tolerance Patient tolerated treatment well;No increased pain    Behavior During Therapy WFL for tasks assessed/performed           Past Medical History:  Diagnosis Date  . A-fib ([place])   . Cancer ([place]) 10/25/2019   Prostate  . 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 Dysrhythmia   . Pulmonary embolism ([place])   . Skin cancer   . TIA (transient ischemic attack)     Past Surgical History:  Procedure Laterality Date  . APPENDECTOMY    . COLONOSCOPY WITH PROPOFOL N/A 11/23/2019   Procedure: COLONOSCOPY WITH PROPOFOL;  Surgeon: [place], Benay Pike, MD;  Location: [facility];  Service: Gastroenterology;  Laterality: N/A;  . cyber knife surgery    . PROSTATE SURGERY      There were no vitals filed for this visit.   Subjective Assessment - 06/04/20 1142    Subjective Pt reports he did a lot of walking recently. Pt states back briefly aches when he gets up but that it goes away with movement.    Pertinent History Pt is a pleasant 75 y.o. male referred to PT for LBP which occured early February. Pt has a PMH of prostate cx, TIA, chronic a-fib, and recent hematuria. Pt has pain with t/f from STS but feels better with walking. Pt states his LBP has been improving but his knees and hips have neem giving him more pain. Pt's LBP is  a strong ache that is on the R side but denies radicular symptoms. Pt reports exercises given by MD have been beneficial and improving his LBP symptoms. Currently in no pain during subjective. when LBP is flared up he reports 4-5/10 NPS. Worst pain is 5-6/10 NPS but pt is unable to explain what causes worst pain but reports possibly most pain due sitting for long periods of time. Prednisone has improved LBP. Pt reports his goals in therapy are to improve balance although he did not mention to Dr. Sabra [name] when referred to PT for his LBP. Also wants to improve his strength. He reports he requires objects to help support him when walking such as walls or furniture. Pt denies falls, but  spouse reports a fall last fall at home where he tripped over a blanket. Has reported near falls where he needed touching of an object to correct. Pt uses a stick now and then for balance but does report difficulty transferring from sturdy to unstardy surfaces (i.e. stepping over a stream) and walking stick helps.    Limitations Standing;Walking;House hold activities    How long can you sit comfortably? Unlimited    How long can you stand comfortably? Unlimited    How long can you walk comfortably? ~1/2 mile  Patient Stated Goals Improve his balance, LE strength, hip/knee pain and stiffness    Currently in Pain? No/denies          TREATMENT  Therex:   Glute bridges 1x10; VC for technique  Staggered glute bridges - 1x11, 1x7 B; VC/TC for technique; pt rates exercise as "medium"; no pain reported with exercise   Open book side-lying 1x16 B; VC/TC and demonstration for technique  Knee to chest with LEs on physioball - 1x17 VC for technique  SLR 2x11 BLEs; VC for technique   Sit<>stands 1x11, 1x6   physioball rollouts forward/backward x multiple reps     Neuro Re-ed: in // bars   SLB x several minutes each LE with CGA  Standing on airex pad with one leg and 6" step with other leg - 3x60 sec each LE,  CGA Standing on dynadisc BLEs, CGA - 2x60 sec     PT Short Term Goals - 05/10/20 1232      PT SHORT TERM GOAL #1   Title Patient will be independent in home exercise program to improve strength/mobility for better functional independence with ADLs.    Baseline 3/3: established HEP today. See medbridge access code    Time 4    Period Weeks    Status New    Target Date 06/07/20             PT Long Term Goals - 05/10/20 1233      PT LONG TERM GOAL #1   Title Patient will increase FOTO score equal to or greater than 69 to demonstrate statistically significant improvement in mobility and quality of life.    Baseline 3/3: 55    Time 8    Period Weeks    Status New    Target Date 07/05/20      PT LONG TERM GOAL #2   Title Patient (> 24 years old) will complete five times sit to stand test in < 12 seconds indicating an increased LE strength and improved balance.    Baseline 3/3: 15.38 m/s    Time 8    Period Weeks    Status New    Target Date 07/05/20      PT LONG TERM GOAL #3   Title Pt will improve hip extension AROM > 10 deg bilaterally to normalize reciprocal gait and balance.    Baseline 3/3: R/L 4 deg/3 deg    Time 8    Period Weeks    Status New    Target Date 07/05/20      PT LONG TERM GOAL #4   Title Pt will improve Berg score > 46 to display decreased risk of falls.    Baseline 3/3: Will assess upon MD approval    Time 8    Period Weeks    Status New    Target Date 07/05/20                 Plan - 06/04/20 1254    Clinical Impression Statement Pt shows improvement with static balance tasks today, requiring just CGA throughout with intermittent UE support. Pt does exhibit some decreased strength with SLRs with pt difficulty maintaining knee extension during exercise. The pt will benefit from further skilled therapy to improve balance and LE strength to increase ease with all ADLs.    Personal Factors and Comorbidities Age;Comorbidity 3+;Past/Current  Experience    Comorbidities A-fib, lumbar disc disease, prostate cancer, and  history of TIA.    Examination-Activity Limitations Reach  Overhead;Stand;Bend;Lift;Locomotion Level;Squat    Examination-Participation Restrictions Community Activity;Yard Work    [employer]chant [employer] officer Evolving/Moderate complexity    Rehab Potential Good    PT Frequency 2x / week    PT Duration 8 weeks    PT Treatment/Interventions ADLs/Self Care Home Management;Biofeedback;Aquatic Therapy;Canalith Repostioning;Cryotherapy;Electrical Stimulation;Iontophoresis 4mg /ml Dexamethasone;Moist Heat;Traction;Ultrasound;Gait training;Stair training;DME Instruction;Functional mobility training;Therapeutic activities;Therapeutic exercise;Balance training;Neuromuscular re-education;Patient/family education;Manual techniques;Passive range of motion;Dry needling;Energy conservation;Vestibular;Joint Manipulations    PT Next Visit Plan PERFORM BERG; dynamic balance activities, LE stretching; progress balance exercises; progress deadbug, static balance exercises, dynamic balance exercises    PT Home Exercise Plan Access Code: [email or web address]    Consulted and Agree with Plan of Care Patient           Patient will benefit from skilled therapeutic intervention in order to improve the following deficits and impairments:  Abnormal gait,Decreased balance,Decreased range of motion,Decreased strength,Hypomobility,Impaired sensation,Pain,Improper body mechanics,Impaired flexibility,Decreased mobility  Visit Diagnosis: Muscle weakness (generalized)  Unsteadiness on feet   Problem List There are no problems to display for this patient.  Ricard Dillon PT, DPT 06/04/2020, 12:58 PM  [place] MAIN [facility] SERVICES [address] [place], [name], [address] Phone: [phone]   Fax:  [phone]  Name: EMANI MORAD MRN: [id number] Date of Birth: 07/30/1945

## 2020-06-07 ENCOUNTER — Other Ambulatory Visit: Payer: Self-pay

## 2020-06-07 ENCOUNTER — Ambulatory Visit: Payer: Medicare Other

## 2020-06-07 DIAGNOSIS — R2689 Other abnormalities of gait and mobility: Secondary | ICD-10-CM

## 2020-06-07 DIAGNOSIS — R2681 Unsteadiness on feet: Secondary | ICD-10-CM

## 2020-06-07 DIAGNOSIS — M6281 Muscle weakness (generalized): Secondary | ICD-10-CM | POA: Diagnosis not present

## 2020-06-07 NOTE — Therapy (Signed)
[place] MAIN [facility] SERVICES [address] [place], [name], [address] Phone: [phone]   Fax:  [phone]  Physical Therapy Treatment/Physical Therapy Progress Note   Dates of reporting period  05/10/2020   to   06/07/2020   Patient Details  Name: Nicholas [name] MRN: [id number] Date of Birth: 04-24-1945 Referring Provider (PT): Rusty Aus, MD   Encounter Date: 06/07/2020   PT End of Session - 06/07/20 1537    Visit Number 10    Number of Visits 16    Date for PT Re-Evaluation 07/05/20    Authorization - Visit Number 2    Authorization - Number of Visits 10    PT Start Time 1106    PT Stop Time 4742    PT Time Calculation (min) 39 min    Equipment Utilized During Treatment Gait belt    Activity Tolerance Patient tolerated treatment well;No increased pain    Behavior During Therapy WFL for tasks assessed/performed           Past Medical History:  Diagnosis Date  . A-fib ([place])   . Cancer ([place]) 10/25/2019   Prostate  . 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 Dysrhythmia   . Pulmonary embolism ([place])   . Skin cancer   . TIA (transient ischemic attack)     Past Surgical History:  Procedure Laterality Date  . APPENDECTOMY    . COLONOSCOPY WITH PROPOFOL N/A 11/23/2019   Procedure: COLONOSCOPY WITH PROPOFOL;  Surgeon: Toledo, Benay Pike, MD;  Location: [facility] ENDOSCOPY;  Service: Gastroenterology;  Laterality: N/A;  . cyber knife surgery    . PROSTATE SURGERY      There were no vitals filed for this visit.   Subjective Assessment - 06/07/20 1109    Subjective Pt reports no pain. Pt says his back briefly ached this morning but that the pain resolved quickly.    Pertinent History Pt is a pleasant 75 y.o. male referred to PT for LBP which occured early February. Pt has a PMH of prostate cx, TIA, chronic a-fib, and recent hematuria. Pt has pain with t/f from STS but feels better with walking. Pt states his LBP has been improving  but his knees and hips have neem giving him more pain. Pt's LBP is a strong ache that is on the R side but denies radicular symptoms. Pt reports exercises given by MD have been beneficial and improving his LBP symptoms. Currently in no pain during subjective. when LBP is flared up he reports 4-5/10 NPS. Worst pain is 5-6/10 NPS but pt is unable to explain what causes worst pain but reports possibly most pain due sitting for long periods of time. Prednisone has improved LBP. Pt reports his goals in therapy are to improve balance although he did not mention to Dr. Sabra [name] when referred to PT for his LBP. Also wants to improve his strength. He reports he requires objects to help support him when walking such as walls or furniture. Pt denies falls, but  spouse reports a fall last fall at home where he tripped over a blanket. Has reported near falls where he needed touching of an object to correct. Pt uses a stick now and then for balance but does report difficulty transferring from sturdy to unstardy surfaces (i.e. stepping over a stream) and walking stick helps.    Limitations Standing;Walking;House hold activities    How long can you sit comfortably? Unlimited    How long can you stand comfortably? Unlimited  How long can you walk comfortably? ~1/2 mile    Patient Stated Goals Improve his balance, LE strength, hip/knee pain and stiffness    Currently in Pain? No/denies              [facility] PT Assessment - 06/07/20 0001      Standardized Balance Assessment   Standardized Balance Assessment Berg Balance Test      Berg Balance Test   Sit to Stand Able to stand without using hands and stabilize independently    Standing Unsupported Able to stand safely 2 minutes    Sitting with Back Unsupported but Feet Supported on Floor or Stool Able to sit safely and securely 2 minutes    Stand to Sit Sits safely with minimal use of hands    Transfers Able to transfer safely, minor use of hands    Standing  Unsupported with Eyes Closed Able to stand 10 seconds safely    Standing Unsupported with Feet Together Able to place feet together independently and stand 1 minute safely    From Standing, Reach Forward with Outstretched Arm Can reach confidently >25 cm (10")    From Standing Position, Pick up Object from Floor Able to pick up shoe, needs supervision    From Standing Position, Turn to Look Behind Over each Shoulder Looks behind from both sides and weight shifts well    Turn 360 Degrees Able to turn 360 degrees safely in 4 seconds or less    Standing Unsupported, Alternately Place Feet on Step/Stool Able to stand independently and safely and complete 8 steps in 20 seconds    Standing Unsupported, One Foot in Front Able to plae foot ahead of the other independently and hold 30 seconds    Standing on One Leg Able to lift leg independently and hold equal to or more than 3 seconds    Total Score 52           TREATMENT   Neuro Re-ED: Merrilee Jansky: 52/56 (achieved)   Therex: FOTO: 59%  5xSTS: (goal achieved) Practice trial: 12.25 sec Second trial: 11 sec  Hip extension ROM: (similar to previous assessment) 5 deg. RLE 3 deg. LLE  Education provided throughout primarily via VC/TC/demonstration to promote movement at target joints and correct muscle activation with exercises and testing. Education also provided before and after each assessment regarding purpose of assessment, body mechanics and technique with assessment. Following pt performance of testing, the PT educated pt on indications for pt progress in therapy, and modifications to exercises/POC in future sessions to target remaining deficits.   Assessment: The patient's goals were re-tested today for a progress note/update. The patient achieved his 5xSTS  (11 sec) goal and Berg goal (52/56). This indicates pt with improved BLE power and good static balance. However, the patient still is unable to maintain SLB for >3 sec without UE support,  and so PT has established new goal to address this deficit in order to increase pt's safety with navigating steps, curbs and obstacles. Pt with similar ROM measurements of hip extension compared to initial assessment, indicating continued impairments in mobility, possibly impacting pt's reports of back stiffness and pain.  Patient's condition has the potential to improve in response to therapy. Maximum improvement is yet to be obtained. The anticipated improvement is attainable and reasonable in a generally predictable time.  Patient reports he feels his back sx have improved, but that he still is not moving as well as he used to and reports ongoing brief pain  and stiffness. The patient will continue to benefit from further skilled therapy to increase pt's mobility, strength, and balance in order to decrease pain and risk of falls.      PT Education - 06/07/20 1845    Education Details Findings of reassessment, indications for therapy, progress, POC    Person(s) Educated Patient    Methods Explanation    Comprehension Verbalized understanding            PT Short Term Goals - 06/07/20 1110      PT SHORT TERM GOAL #1   Title Patient will be independent in home exercise program to improve strength/mobility for better functional independence with ADLs.    Baseline 3/3: established HEP today. See medbridge access code; 06/07/2020 pt reports doing some of his HEP    Time 4    Period Weeks    Status New    Target Date 06/07/20             PT Long Term Goals - 06/07/20 1111      PT LONG TERM GOAL #1   Title Patient will increase FOTO score equal to or greater than 69 to demonstrate statistically significant improvement in mobility and quality of life.    Baseline 3/3: 55; 3/31 59    Time 8    Period Weeks    Status New      PT LONG TERM GOAL #2   Title Patient (> 19 years old) will complete five times sit to stand test in < 12 seconds indicating an increased LE strength and improved  balance.    Baseline 3/3: 15.38 m/s; 06/07/2020: first trial 12.25 sec, second trail 11 sec    Time 8    Period Weeks    Status Achieved      PT LONG TERM GOAL #3   Title Pt will improve hip extension AROM > 10 deg bilaterally to normalize reciprocal gait and balance.    Baseline 3/3: R/L 4 deg/3 deg    Time 8    Period Weeks    Status New      PT LONG TERM GOAL #4   Title Pt will improve Berg score > 46 to display decreased risk of falls.    Baseline 3/3: Will assess upon MD approval; 52/56    Time 8    Period Weeks    Status Achieved                  Patient will benefit from skilled therapeutic intervention in order to improve the following deficits and impairments:     Visit Diagnosis: Unsteadiness on feet  Muscle weakness (generalized)  Other abnormalities of gait and mobility     Problem List There are no problems to display for this patient.  Ricard Dillon PT, DPT 06/07/2020, 6:47 PM  [place] MAIN [facility] SERVICES [address] [place], [name], [address] Phone: [phone]   Fax:  [phone]  Name: Nicholas [name] MRN: [id number] Date of Birth: 05/13/1945

## 2020-06-11 ENCOUNTER — Ambulatory Visit: Payer: Medicare Other | Attending: Internal Medicine

## 2020-06-11 VITALS — BP 102/64 | HR 66

## 2020-06-11 DIAGNOSIS — M6281 Muscle weakness (generalized): Secondary | ICD-10-CM | POA: Insufficient documentation

## 2020-06-11 DIAGNOSIS — M545 Low back pain, unspecified: Secondary | ICD-10-CM | POA: Diagnosis present

## 2020-06-11 DIAGNOSIS — R2689 Other abnormalities of gait and mobility: Secondary | ICD-10-CM | POA: Insufficient documentation

## 2020-06-11 DIAGNOSIS — R2681 Unsteadiness on feet: Secondary | ICD-10-CM | POA: Diagnosis present

## 2020-06-11 DIAGNOSIS — G8929 Other chronic pain: Secondary | ICD-10-CM | POA: Diagnosis present

## 2020-06-11 NOTE — Therapy (Signed)
[place] MAIN [facility] SERVICES [address] [place], [name], [address] Phone: [phone]   Fax:  [phone]  Physical Therapy Treatment  Patient Details  Name: Nicholas [name] MRN: [id number] Date of Birth: Jun 11, 1945 Referring Provider (PT): Rusty Aus, MD   Encounter Date: 06/11/2020   PT End of Session - 06/11/20 1321    Visit Number 11    Number of Visits 16    Date for PT Re-Evaluation 07/05/20    Authorization - Visit Number 2    Authorization - Number of Visits 10    PT Start Time 6962    PT Stop Time 1232    PT Time Calculation (min) 45 min    Equipment Utilized During Treatment Gait belt    Activity Tolerance Patient tolerated treatment well;No increased pain    Behavior During Therapy WFL for tasks assessed/performed           Past Medical History:  Diagnosis Date  . A-fib ([place])   . Cancer ([place]) 10/25/2019   Prostate  . 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 Dysrhythmia   . Pulmonary embolism ([place])   . Skin cancer   . TIA (transient ischemic attack)     Past Surgical History:  Procedure Laterality Date  . APPENDECTOMY    . COLONOSCOPY WITH PROPOFOL N/A 11/23/2019   Procedure: COLONOSCOPY WITH PROPOFOL;  Surgeon: Toledo, Benay Pike, MD;  Location: [facility] ENDOSCOPY;  Service: Gastroenterology;  Laterality: N/A;  . cyber knife surgery    . PROSTATE SURGERY      Vitals:   06/11/20 1328  BP: 102/64  Pulse: 66     Subjective Assessment - 06/11/20 1320    Subjective Pt says he did a lot of bending and lifting to move stones at his [employer] this weekend and that he felt weak and still feels weak in his legs today. Pt reports no pain currently.    Pertinent History Pt is a pleasant 75 y.o. male referred to PT for LBP which occured early February. Pt has a PMH of prostate cx, TIA, chronic a-fib, and recent hematuria. Pt has pain with t/f from STS but feels better with walking. Pt states his LBP has been improving but  his knees and hips have neem giving him more pain. Pt's LBP is a strong ache that is on the R side but denies radicular symptoms. Pt reports exercises given by MD have been beneficial and improving his LBP symptoms. Currently in no pain during subjective. when LBP is flared up he reports 4-5/10 NPS. Worst pain is 5-6/10 NPS but pt is unable to explain what causes worst pain but reports possibly most pain due sitting for long periods of time. Prednisone has improved LBP. Pt reports his goals in therapy are to improve balance although he did not mention to Dr. Sabra [name] when referred to PT for his LBP. Also wants to improve his strength. He reports he requires objects to help support him when walking such as walls or furniture. Pt denies falls, but  spouse reports a fall last fall at home where he tripped over a blanket. Has reported near falls where he needed touching of an object to correct. Pt uses a stick now and then for balance but does report difficulty transferring from sturdy to unstardy surfaces (i.e. stepping over a stream) and walking stick helps.    Limitations Standing;Walking;House hold activities    How long can you sit comfortably? Unlimited    How long  can you stand comfortably? Unlimited    How long can you walk comfortably? ~1/2 mile    Patient Stated Goals Improve his balance, LE strength, hip/knee pain and stiffness    Currently in Pain? No/denies           TREATMENT   Therex:  VITALS: 102/64 HR 66  Open book side-lying 1x15 B; VC for technique; Pt greater ROM limitation to R  Supine hip flexor stretch 60 sec BLES; VC/TC for technique  STS 1x10,1x8 with no UE assist, CGA;  pt rates exercise difficulty "medium"  LAQ 4# AW 1x8 BLEs, 5# AW 1x20 BLEs; pt rates exercise medium." Demo/VC provided for exercise technique    Neuro Re-ed at support surface:   SLB x 3x30 sec BLEs with 3 finger support to intermittent UE support, CGA; demo/VC for technique  Tandem stance 2x30 sec  BLEs with 3 finger to intermittent UE support, CGA; demo/VC for technique     Education provided via VC/TC/demonstration to facilitate improved muscle contraction and movement at target joints with all exercise.    Assessment: Pt with improved postural stability this session with SLB and tandem stance balance exercises using three-finger to intermittent UE support. Pt also reported improvement in knee sx/reports of leg weakness following therex. PT instructed pt in supine hip flexor stretch and pt reported feeling stretch into low back on R side. Pt also exhibited decreased ROM to R with open book exercise, indicating decreased mobility of thoracic and lumbar spine compared to L side. The pt will benefit from further skilled therapy to improve LE strength, mobility and balance to decrease fall risk and improve QOL.   PT Education - 06/11/20 1321    Education Details Pt educated on supine hip flexor stretch    Person(s) Educated Patient    Methods Explanation;Verbal cues;Tactile cues    Comprehension Verbalized understanding;Returned demonstration            PT Short Term Goals - 06/07/20 1110      PT SHORT TERM GOAL #1   Title Patient will be independent in home exercise program to improve strength/mobility for better functional independence with ADLs.    Baseline 3/3: established HEP today. See medbridge access code; 06/07/2020 pt reports doing some of his HEP, but that he has difficulty with some of the supine exercises    Time 4    Period Weeks    Status Partially Met    Target Date 07/05/20             PT Long Term Goals - 06/07/20 1111      PT LONG TERM GOAL #1   Title Patient will increase FOTO score equal to or greater than 69 to demonstrate statistically significant improvement in mobility and quality of life.    Baseline 3/3: 55; 3/31 59%    Time 8    Period Weeks    Status On-going    Target Date 07/05/20      PT LONG TERM GOAL #2   Title Patient (> 41 years old)  will complete five times sit to stand test in < 12 seconds indicating an increased LE strength and improved balance.    Baseline 3/3: 15.38 m/s; 06/07/2020:  11 sec    Time 8    Period Weeks    Status Achieved    Target Date 07/05/20      PT LONG TERM GOAL #3   Title Pt will improve hip extension AROM > 10 deg bilaterally to  normalize reciprocal gait and balance.    Baseline 3/3: R/L 4 deg/3 deg; 3/31 R/L 5 deg/3 deg    Time 8    Period Weeks    Status New    Target Date 07/05/20      PT LONG TERM GOAL #4   Title Pt will improve Berg score > 46 to display decreased risk of falls.    Baseline 3/3: Will assess upon MD approval; 06/07/2020 52/56    Time 8    Period Weeks    Status Achieved    Target Date 07/05/20      PT LONG TERM GOAL #5   Title The patient will be able to maintain at least 10 seconds of SLB on BLEs in order to decrease risk of falls when navigating obstacles, curbs and steps.    Baseline 06/07/2020: pt unable to maintain more than 3 sec of SLB without UE support    Time 4    Period Weeks    Status New    Target Date 07/05/20                 Plan - 06/11/20 1322    Clinical Impression Statement Pt with improved postural stability this session with SLB and tandem stance balance exercises using three-finger to intermittent UE support. Pt also reported improvement in knee sx/reports of leg weakness following therex. PT instructed pt in supine hip flexor stretch and pt reported feeling stretch into low back on R side. Pt also exhibited decreased ROM to R with open book exercise, indicating decreased mobility of thoracic and lumbar spine compared to L side. The pt will benefit from further skilled therapy to improve LE strength, mobility and balance to decrease fall risk and improve QOL.    Personal Factors and Comorbidities Age;Comorbidity 3+;Past/Current Experience    Comorbidities A-fib, lumbar disc disease, prostate cancer, and  history of TIA.     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    Rehab Potential Good    PT Frequency 2x / week    PT Duration 8 weeks    PT Treatment/Interventions ADLs/Self Care Home Management;Biofeedback;Aquatic Therapy;Canalith Repostioning;Cryotherapy;Electrical Stimulation;Iontophoresis 61m/ml Dexamethasone;Moist Heat;Traction;Ultrasound;Gait training;Stair training;DME Instruction;Functional mobility training;Therapeutic activities;Therapeutic exercise;Balance training;Neuromuscular re-education;Patient/family education;Manual techniques;Passive range of motion;Dry needling;Energy conservation;Vestibular;Joint Manipulations    PT Next Visit Plan PERFORM BERG; dynamic balance activities, LE stretching; progress balance exercises; progress deadbug, static balance exercises, dynamic balance exercises, assess quad length/hip flexors, progress static balance exercises    PT Home Exercise Plan Access Code: [email or web address]    Consulted and Agree with Plan of Care Patient           Patient will benefit from skilled therapeutic intervention in order to improve the following deficits and impairments:  Abnormal gait,Decreased balance,Decreased range of motion,Decreased strength,Hypomobility,Impaired sensation,Pain,Improper body mechanics,Impaired flexibility,Decreased mobility  Visit Diagnosis: Muscle weakness (generalized)  Unsteadiness on feet  Other abnormalities of gait and mobility     Problem List There are no problems to display for this patient.  HRicard DillonPT, DPT 06/11/2020, 1:33 PM  [place] [facility] [address] [address]Phone: [phone]  Fax:  [phone] Name: CMURL GOLLADAYMRN: [id number] of Birth: 1March 09, 1947

## 2020-06-14 ENCOUNTER — Other Ambulatory Visit: Payer: Self-pay

## 2020-06-14 ENCOUNTER — Ambulatory Visit: Payer: Medicare Other

## 2020-06-14 DIAGNOSIS — M6281 Muscle weakness (generalized): Secondary | ICD-10-CM

## 2020-06-14 DIAGNOSIS — R2681 Unsteadiness on feet: Secondary | ICD-10-CM

## 2020-06-14 DIAGNOSIS — R2689 Other abnormalities of gait and mobility: Secondary | ICD-10-CM

## 2020-06-14 NOTE — Therapy (Signed)
[place] MAIN [facility] SERVICES [address] [place], [name], [address] Phone: [phone]   Fax:  [phone]  Physical Therapy Treatment  Patient Details  Name: Nicholas [name] MRN: [id number] Date of Birth: 03-16-1945 Referring Provider (PT): Rusty Aus, MD   Encounter Date: 06/14/2020   PT End of Session - 06/14/20 1540    Visit Number 12    Number of Visits 16    Date for PT Re-Evaluation 07/05/20    Authorization - Visit Number 2    Authorization - Number of Visits 10    PT Start Time 4174    PT Stop Time 1229    PT Time Calculation (min) 43 min    Equipment Utilized During Treatment Gait belt    Activity Tolerance Patient tolerated treatment well;No increased pain    Behavior During Therapy WFL for tasks assessed/performed           Past Medical History:  Diagnosis Date  . A-fib ([place])   . Cancer ([place]) 10/25/2019   Prostate  . 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 Dysrhythmia   . Pulmonary embolism ([place])   . Skin cancer   . TIA (transient ischemic attack)     Past Surgical History:  Procedure Laterality Date  . APPENDECTOMY    . COLONOSCOPY WITH PROPOFOL N/A 11/23/2019   Procedure: COLONOSCOPY WITH PROPOFOL;  Surgeon: Toledo, Benay Pike, MD;  Location: [facility] ENDOSCOPY;  Service: Gastroenterology;  Laterality: N/A;  . cyber knife surgery    . PROSTATE SURGERY      There were no vitals filed for this visit.   Subjective Assessment - 06/14/20 1536    Subjective Patient reports still stiff and sore from outdoor yardwork at church this past weekend.    Pertinent History Pt is a pleasant 75 y.o. male referred to PT for LBP which occured early February. Pt has a PMH of prostate cx, TIA, chronic a-fib, and recent hematuria. Pt has pain with t/f from STS but feels better with walking. Pt states his LBP has been improving but his knees and hips have neem giving him more pain. Pt's LBP is a strong ache that is on the R side  but denies radicular symptoms. Pt reports exercises given by MD have been beneficial and improving his LBP symptoms. Currently in no pain during subjective. when LBP is flared up he reports 4-5/10 NPS. Worst pain is 5-6/10 NPS but pt is unable to explain what causes worst pain but reports possibly most pain due sitting for long periods of time. Prednisone has improved LBP. Pt reports his goals in therapy are to improve balance although he did not mention to Dr. Sabra [name] when referred to PT for his LBP. Also wants to improve his strength. He reports he requires objects to help support him when walking such as walls or furniture. Pt denies falls, but  spouse reports a fall last fall at home where he tripped over a blanket. Has reported near falls where he needed touching of an object to correct. Pt uses a stick now and then for balance but does report difficulty transferring from sturdy to unstardy surfaces (i.e. stepping over a stream) and walking stick helps.    Limitations Standing;Walking;House hold activities    How long can you sit comfortably? Unlimited    How long can you stand comfortably? Unlimited    How long can you walk comfortably? ~1/2 mile    Patient Stated Goals Improve his balance, LE strength,  hip/knee pain and stiffness    Currently in Pain? Yes    Pain Location Back    Pain Orientation Right    Pain Descriptors / Indicators Aching    Pain Type Acute pain    Pain Onset In the past 7 days    Pain Frequency Intermittent          Interventions:  PROM- single Knee to chest, [place]er, Hamstring, hip IR/ER x 20 min. Patient able to accept these stretching with appropriate response of "Tight- but I think it is helping." Patient reported feeling better after stretching. Verbalized use of strap to perform knee to chest and HS stretch on his own. He verbalized understanding.   Instruction in log roll technique and patient required visual demo for correct form yet able to perform today with no  physical assistance.   Nustep: L2 LE only with VC to maintain > 60 SPM and patient able to complete without any report of pain. Reported activity as "medium"      Neuro Re-ed: Performed at rehab station with parallel bar  SLB x3x30 sec BLEs with 3 finger support to intermittent UE support, CGA; demo/VC for technique. Patient unable to hold >6-7 sec either leg without reaching for UE support.   Tandem stance 2x30 sec BLEs with 3 finger to intermittent UE support, CGA; demo/VC for technique   Education provided via VC/TC/demonstration to facilitate improved muscle contraction and movement at target joints with all exercise.   Clinical Impression:  Pt presented today with increased tightknee in right sided low back and LE vs. Left LE. He responded well to stretches today with reported "feeling looser. He was also able to perform Log roll technique and able to sit up from supine without low back pain today. He continues to present with difficulty with single leg balance but improved with staggered/tandem stance today. He  will benefit from further skilled therapy to improve LE strength, mobility and balance to decrease fall risk and improve quality of life.                         PT Short Term Goals - 06/07/20 1110      PT SHORT TERM GOAL #1   Title Patient will be independent in home exercise program to improve strength/mobility for better functional independence with ADLs.    Baseline 3/3: established HEP today. See medbridge access code; 06/07/2020 pt reports doing some of his HEP, but that he has difficulty with some of the supine exercises    Time 4    Period Weeks    Status Partially Met    Target Date 07/05/20             PT Long Term Goals - 06/07/20 1111      PT LONG TERM GOAL #1   Title Patient will increase FOTO score equal to or greater than 69 to demonstrate statistically significant improvement in mobility and quality of life.    Baseline 3/3:  55; 3/31 59%    Time 8    Period Weeks    Status On-going    Target Date 07/05/20      PT LONG TERM GOAL #2   Title Patient (> 75 years old) will complete five times sit to stand test in < 12 seconds indicating an increased LE strength and improved balance.    Baseline 3/3: 15.38 m/s; 06/07/2020:  11 sec    Time 8    Period Weeks  Status Achieved    Target Date 07/05/20      PT LONG TERM GOAL #3   Title Pt will improve hip extension AROM > 10 deg bilaterally to normalize reciprocal gait and balance.    Baseline 3/3: R/L 4 deg/3 deg; 3/31 R/L 5 deg/3 deg    Time 8    Period Weeks    Status New    Target Date 07/05/20      PT LONG TERM GOAL #4   Title Pt will improve Berg score > 46 to display decreased risk of falls.    Baseline 3/3: Will assess upon MD approval; 06/07/2020 52/56    Time 8    Period Weeks    Status Achieved    Target Date 07/05/20      PT LONG TERM GOAL #5   Title The patient will be able to maintain at least 10 seconds of SLB on BLEs in order to decrease risk of falls when navigating obstacles, curbs and steps.    Baseline 06/07/2020: pt unable to maintain more than 3 sec of SLB without UE support    Time 4    Period Weeks    Status New    Target Date 07/05/20                 Plan - 06/14/20 1540    Clinical Impression Statement Pt presented today with increased tightknee in right sided low back and LE vs. Left LE. He responded well to stretches today with reported "feeling looser. He was also able to perform Log roll technique and able to sit up from supine without low back pain today. He continues to present with difficulty with single leg balance but improved with staggered/tandem stance today. He  will benefit from further skilled therapy to improve LE strength, mobility and balance to decrease fall risk and improve quality of life.    Personal Factors and Comorbidities Age;Comorbidity 3+;Past/Current Experience    Comorbidities A-fib, lumbar disc  disease, prostate cancer, and  history of TIA.    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    Rehab Potential Good    PT Frequency 2x / week    PT Duration 8 weeks    PT Treatment/Interventions ADLs/Self Care Home Management;Biofeedback;Aquatic Therapy;Canalith Repostioning;Cryotherapy;Electrical Stimulation;Iontophoresis 33m/ml Dexamethasone;Moist Heat;Traction;Ultrasound;Gait training;Stair training;DME Instruction;Functional mobility training;Therapeutic activities;Therapeutic exercise;Balance training;Neuromuscular re-education;Patient/family education;Manual techniques;Passive range of motion;Dry needling;Energy conservation;Vestibular;Joint Manipulations    PT Next Visit Plan PERFORM BERG; dynamic balance activities, LE stretching; progress balance exercises; progress deadbug, static balance exercises, dynamic balance exercises, assess quad length/hip flexors, progress static balance exercises    Consulted and Agree with Plan of Care Patient           Patient will benefit from skilled therapeutic intervention in order to improve the following deficits and impairments:  Abnormal gait,Decreased balance,Decreased range of motion,Decreased strength,Hypomobility,Impaired sensation,Pain,Improper body mechanics,Impaired flexibility,Decreased mobility  Visit Diagnosis: Muscle weakness (generalized)  Unsteadiness on feet  Other abnormalities of gait and mobility     Problem List There are no problems to display for this patient.   JLewis Moccasin PT 06/14/2020, 3:59 PM  [place] [facility] [address] [address]Phone: [phone]  Fax:  [phone] Name: CFOY VANDUYNEMRN: [id number] of Birth: 121-Jul-1947

## 2020-06-18 ENCOUNTER — Ambulatory Visit: Payer: Medicare Other

## 2020-06-18 ENCOUNTER — Other Ambulatory Visit: Payer: Self-pay

## 2020-06-18 DIAGNOSIS — R2681 Unsteadiness on feet: Secondary | ICD-10-CM

## 2020-06-18 DIAGNOSIS — R2689 Other abnormalities of gait and mobility: Secondary | ICD-10-CM

## 2020-06-18 DIAGNOSIS — M6281 Muscle weakness (generalized): Secondary | ICD-10-CM

## 2020-06-18 NOTE — Therapy (Signed)
[place] MAIN [facility] SERVICES [address] [place], [name], [address] Phone: [phone]   Fax:  [phone]  Physical Therapy Treatment  Patient Details  Name: Nicholas [name] MRN: [id number] Date of Birth: 20-Jan-1946 Referring Provider (PT): Rusty Aus, MD   Encounter Date: 06/18/2020   PT End of Session - 06/18/20 1456    Visit Number 13    Number of Visits 16    Date for PT Re-Evaluation 07/05/20    Authorization - Visit Number 2    Authorization - Number of Visits 10    PT Start Time 4239    PT Stop Time 1231    PT Time Calculation (min) 43 min    Equipment Utilized During Treatment Gait belt    Activity Tolerance Patient tolerated treatment well;No increased pain    Behavior During Therapy WFL for tasks assessed/performed           Past Medical History:  Diagnosis Date  . A-fib ([place])   . Cancer ([place]) 10/25/2019   Prostate  . 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 Dysrhythmia   . Pulmonary embolism ([place])   . Skin cancer   . TIA (transient ischemic attack)     Past Surgical History:  Procedure Laterality Date  . APPENDECTOMY    . COLONOSCOPY WITH PROPOFOL N/A 11/23/2019   Procedure: COLONOSCOPY WITH PROPOFOL;  Surgeon: Toledo, Benay Pike, MD;  Location: [facility] ENDOSCOPY;  Service: Gastroenterology;  Laterality: N/A;  . cyber knife surgery    . PROSTATE SURGERY      There were no vitals filed for this visit.   Subjective Assessment - 06/18/20 1454    Subjective Pt didn't sleep well last night. Pt reports no back pain currently, but says his legs feel weak. Pt wore knee brace on Sunday at church but did not find the brace helpful.    Pertinent History Pt is a pleasant 75 y.o. male referred to PT for LBP which occured early February. Pt has a PMH of prostate cx, TIA, chronic a-fib, and recent hematuria. Pt has pain with t/f from STS but feels better with walking. Pt states his LBP has been improving but his knees and  hips have neem giving him more pain. Pt's LBP is a strong ache that is on the R side but denies radicular symptoms. Pt reports exercises given by MD have been beneficial and improving his LBP symptoms. Currently in no pain during subjective. when LBP is flared up he reports 4-5/10 NPS. Worst pain is 5-6/10 NPS but pt is unable to explain what causes worst pain but reports possibly most pain due sitting for long periods of time. Prednisone has improved LBP. Pt reports his goals in therapy are to improve balance although he did not mention to Dr. Sabra [name] when referred to PT for his LBP. Also wants to improve his strength. He reports he requires objects to help support him when walking such as walls or furniture. Pt denies falls, but  spouse reports a fall last fall at home where he tripped over a blanket. Has reported near falls where he needed touching of an object to correct. Pt uses a stick now and then for balance but does report difficulty transferring from sturdy to unstardy surfaces (i.e. stepping over a stream) and walking stick helps.    Limitations Standing;Walking;House hold activities    How long can you sit comfortably? Unlimited    How long can you stand comfortably? Unlimited  How long can you walk comfortably? ~1/2 mile    Patient Stated Goals Improve his balance, LE strength, hip/knee pain and stiffness    Currently in Pain? No/denies    Pain Onset In the past 7 days           TREATMENT   Therex:   STS 2x11 with no UE assist, supervision. VC for technique.  LAQ with 5# AW 2x16 BLEs; pt rates "easy," progressed pt to 2.5# for 1x16 BLEs; pt rates exercise "medium"  Standing marches with 7.5# AW 3x16 BLEs, VC/Demo for technique.  Standing hip abduction with 7.5# AW 3x15 BLEs; VC/demo for technique. Pt fatigues with decrease in technique and compensation with hip flexors.   Seated foot intrinsics with towel scrunches - x 2 min for each LE; VC/demo for technique  Neuro Re-ed:  Performed at rehab station with parallel bar SLB 6x30 sec each LE, CGA. VC for technique. Intermittent UE support.    Education provided via VC/TC/demonstration to facilitate improved muscle contraction and movement at target joints with all exercise.    PT Education - 06/18/20 1456    Education Details Technique with standing therex for hip mm strengthening, body mechanics    Person(s) Educated Patient    Methods Explanation;Demonstration;Verbal cues    Comprehension Verbalized understanding;Returned demonstration            PT Short Term Goals - 06/07/20 1110      PT SHORT TERM GOAL #1   Title Patient will be independent in home exercise program to improve strength/mobility for better functional independence with ADLs.    Baseline 3/3: established HEP today. See medbridge access code; 06/07/2020 pt reports doing some of his HEP, but that he has difficulty with some of the supine exercises    Time 4    Period Weeks    Status Partially Met    Target Date 07/05/20             PT Long Term Goals - 06/07/20 1111      PT LONG TERM GOAL #1   Title Patient will increase FOTO score equal to or greater than 69 to demonstrate statistically significant improvement in mobility and quality of life.    Baseline 3/3: 55; 3/31 59%    Time 8    Period Weeks    Status On-going    Target Date 07/05/20      PT LONG TERM GOAL #2   Title Patient (> 49 years old) will complete five times sit to stand test in < 12 seconds indicating an increased LE strength and improved balance.    Baseline 3/3: 15.38 m/s; 06/07/2020:  11 sec    Time 8    Period Weeks    Status Achieved    Target Date 07/05/20      PT LONG TERM GOAL #3   Title Pt will improve hip extension AROM > 10 deg bilaterally to normalize reciprocal gait and balance.    Baseline 3/3: R/L 4 deg/3 deg; 3/31 R/L 5 deg/3 deg    Time 8    Period Weeks    Status New    Target Date 07/05/20      PT LONG TERM GOAL #4   Title Pt will  improve Berg score > 46 to display decreased risk of falls.    Baseline 3/3: Will assess upon MD approval; 06/07/2020 52/56    Time 8    Period Weeks    Status Achieved    Target  Date 07/05/20      PT LONG TERM GOAL #5   Title The patient will be able to maintain at least 10 seconds of SLB on BLEs in order to decrease risk of falls when navigating obstacles, curbs and steps.    Baseline 06/07/2020: pt unable to maintain more than 3 sec of SLB without UE support    Time 4    Period Weeks    Status New    Target Date 07/05/20                 Plan - 06/18/20 1457    Clinical Impression Statement Pt tolerated all therex well with no reports of pain. Today's session focused on LE strengthening, where pt was progressed to using 7.5# AWs with majority of therex, indicating improved BLE strength. Although pt demonstrates progress, pt had difficulty maintaining correct technique with standing hip abduction d/t fatigue, and started compensating with his hip flexors. The pt also still with difficulty maintaining SLB without intermittent UE support. The pt will benefit from further skilled therapy to improve balance and BLE strength to increase ease and safety with community participation.    Personal Factors and Comorbidities Age;Comorbidity 3+;Past/Current Experience    Comorbidities A-fib, lumbar disc disease, prostate cancer, and  history of TIA.    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    Rehab Potential Good    PT Frequency 2x / week    PT Duration 8 weeks    PT Treatment/Interventions ADLs/Self Care Home Management;Biofeedback;Aquatic Therapy;Canalith Repostioning;Cryotherapy;Electrical Stimulation;Iontophoresis 9m/ml Dexamethasone;Moist Heat;Traction;Ultrasound;Gait training;Stair training;DME  Instruction;Functional mobility training;Therapeutic activities;Therapeutic exercise;Balance training;Neuromuscular re-education;Patient/family education;Manual techniques;Passive range of motion;Dry needling;Energy conservation;Vestibular;Joint Manipulations    PT Next Visit Plan PERFORM BERG; dynamic balance activities, LE stretching; progress balance exercises; progress deadbug, static balance exercises, dynamic balance exercises, assess quad length/hip flexors, progress static balance exercises    Consulted and Agree with Plan of Care Patient           Patient will benefit from skilled therapeutic intervention in order to improve the following deficits and impairments:  Abnormal gait,Decreased balance,Decreased range of motion,Decreased strength,Hypomobility,Impaired sensation,Pain,Improper body mechanics,Impaired flexibility,Decreased mobility  Visit Diagnosis: Muscle weakness (generalized)  Unsteadiness on feet  Other abnormalities of gait and mobility     Problem List There are no problems to display for this patient.  HRicard DillonPT, DPT 06/18/2020, 3:06 PM  [place] [facility]ied[facility] HospitalSERVICES [address] [place] [address]Phone: [phone]  Fax:  [phone] Name: Nicholas PROUTMRN: [id number] of Birth: 1May 31, 1947

## 2020-06-21 ENCOUNTER — Other Ambulatory Visit: Payer: Self-pay

## 2020-06-21 ENCOUNTER — Ambulatory Visit: Payer: Medicare Other

## 2020-06-21 DIAGNOSIS — M6281 Muscle weakness (generalized): Secondary | ICD-10-CM

## 2020-06-21 DIAGNOSIS — R2681 Unsteadiness on feet: Secondary | ICD-10-CM

## 2020-06-21 DIAGNOSIS — R2689 Other abnormalities of gait and mobility: Secondary | ICD-10-CM

## 2020-06-21 NOTE — Therapy (Signed)
[place] MAIN [facility] SERVICES [address] [place], [name], [address] Phone: [phone]   Fax:  [phone]  Physical Therapy Treatment  Patient Details  Name: Nicholas [name] MRN: [id number] Date of Birth: [birth year] Referring Provider (PT): Rusty Aus, MD   Encounter Date: 06/21/2020   PT End of Session - 06/21/20 1839    Visit Number 14    Number of Visits 16    Date for PT Re-Evaluation 07/05/20    Authorization - Visit Number 2    Authorization - Number of Visits 10    PT Start Time 1146    PT Stop Time 1230    PT Time Calculation (min) 44 min    Equipment Utilized During Treatment Gait belt    Activity Tolerance Patient tolerated treatment well;No increased pain    Behavior During Therapy WFL for tasks assessed/performed           Past Medical History:  Diagnosis Date  . A-fib ([place])   . Cancer ([place]) 10/25/2019   Prostate  . 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 Dysrhythmia   . Pulmonary embolism ([place])   . Skin cancer   . TIA (transient ischemic attack)     Past Surgical History:  Procedure Laterality Date  . APPENDECTOMY    . COLONOSCOPY WITH PROPOFOL N/A 11/23/2019   Procedure: COLONOSCOPY WITH PROPOFOL;  Surgeon: Toledo, Benay Pike, MD;  Location: [facility] ENDOSCOPY;  Service: Gastroenterology;  Laterality: N/A;  . cyber knife surgery    . PROSTATE SURGERY      There were no vitals filed for this visit.   Subjective Assessment - 06/21/20 1838    Subjective Pt reports continued improvement in back sx and states "back feels better than it has been in general." Pt reports he has had pain in his feet that interrupted his sleep, where the pain is of a burning and stinging nature. Pt says he took gout medication and that it improved his symptoms. Walking also helps symptoms.    Pertinent History Pt is a pleasant 75 y.o. male referred to PT for LBP which occured early February. Pt has a PMH of prostate cx, TIA, chronic  a-fib, and recent hematuria. Pt has pain with t/f from STS but feels better with walking. Pt states his LBP has been improving but his knees and hips have neem giving him more pain. Pt's LBP is a strong ache that is on the R side but denies radicular symptoms. Pt reports exercises given by MD have been beneficial and improving his LBP symptoms. Currently in no pain during subjective. when LBP is flared up he reports 4-5/10 NPS. Worst pain is 5-6/10 NPS but pt is unable to explain what causes worst pain but reports possibly most pain due sitting for long periods of time. Prednisone has improved LBP. Pt reports his goals in therapy are to improve balance although he did not mention to Dr. Sabra [name] when referred to PT for his LBP. Also wants to improve his strength. He reports he requires objects to help support him when walking such as walls or furniture. Pt denies falls, but  spouse reports a fall last fall at home where he tripped over a blanket. Has reported near falls where he needed touching of an object to correct. Pt uses a stick now and then for balance but does report difficulty transferring from sturdy to unstardy surfaces (i.e. stepping over a stream) and walking stick helps.    Limitations Standing;Walking;House hold  activities    How long can you sit comfortably? Unlimited    How long can you stand comfortably? Unlimited    How long can you walk comfortably? ~1/2 mile    Patient Stated Goals Improve his balance, LE strength, hip/knee pain and stiffness    Currently in Pain? No/denies    Pain Onset In the past 7 days           TREATMENT  NEURO RE-ED: CGA provided throughout, performed at support surface  Tandem stance on airex pad - 6x30 sec each LE as stance leg, intermittent UE support required Modified SLB with toe-touch 4x30 sec each LE, intermittent UE support SLB 6x30 sec each LE, intermittent UE uspport; pt with improved ability to maintain balance on RLE before requiring UE support  (approximatley 8 seconds). Standing ankle rocker to promote proprioception/ankle strategies - x multiple reps. Frequent demo/VC provided to reduce pt compensation with swaying to move rocker board. Pt with difficulty isolating plantarflexion to dorsiflexion movement B. Modified previous exercise to one LE on ankle rocker board and one foot on floor - x multiple reps. Pt still with difficulty performing movement at target joint. Pt reports feeling of "tightness" in B gastrocsol.  PT followed ankle rocker exercises with ROM assessment of B ankles  THEREX: ANKLE ROM assessment:  Pt lacking 9 degrees of dorsiflexion from neutral on RLE, lacking 5 degrees from neutral dorsiflexion on LLE.  PT provided education to pt on how decreased B dorsiflexion ROM is affecting pt's ability to perform certain therex and that it is possibly impacting his ability to use ankle strategies for balance. Further education provided on stretches to address decreased dorsiflexion ROM B. The pt verbalized understanding.  PROM dorsiflexion to plantarflexion with pt in sitting x multiple reps each LE Standing dorsiflexion stretch x 2 min each LE. Demo/VC for technique.       PT Education - 06/21/20 1839    Education Details Pt educated on gastroc stretch technique, body mechanics    Person(s) Educated Patient    Methods Explanation;Demonstration;Verbal cues    Comprehension Verbalized understanding;Returned demonstration            PT Short Term Goals - 06/07/20 1110      PT SHORT TERM GOAL #1   Title Patient will be independent in home exercise program to improve strength/mobility for better functional independence with ADLs.    Baseline 3/3: established HEP today. See medbridge access code; 06/07/2020 pt reports doing some of his HEP, but that he has difficulty with some of the supine exercises    Time 4    Period Weeks    Status Partially Met    Target Date 07/05/20             PT Long Term Goals -  06/07/20 1111      PT LONG TERM GOAL #1   Title Patient will increase FOTO score equal to or greater than 69 to demonstrate statistically significant improvement in mobility and quality of life.    Baseline 3/3: 55; 3/31 59%    Time 8    Period Weeks    Status On-going    Target Date 07/05/20      PT LONG TERM GOAL #2   Title Patient (> 25 years old) will complete five times sit to stand test in < 12 seconds indicating an increased LE strength and improved balance.    Baseline 3/3: 15.38 m/s; 06/07/2020:  11 sec    Time 8  Period Weeks    Status Achieved    Target Date 07/05/20      PT LONG TERM GOAL #3   Title Pt will improve hip extension AROM > 10 deg bilaterally to normalize reciprocal gait and balance.    Baseline 3/3: R/L 4 deg/3 deg; 3/31 R/L 5 deg/3 deg    Time 8    Period Weeks    Status New    Target Date 07/05/20      PT LONG TERM GOAL #4   Title Pt will improve Berg score > 46 to display decreased risk of falls.    Baseline 3/3: Will assess upon MD approval; 06/07/2020 52/56    Time 8    Period Weeks    Status Achieved    Target Date 07/05/20      PT LONG TERM GOAL #5   Title The patient will be able to maintain at least 10 seconds of SLB on BLEs in order to decrease risk of falls when navigating obstacles, curbs and steps.    Baseline 06/07/2020: pt unable to maintain more than 3 sec of SLB without UE support    Time 4    Period Weeks    Status New    Target Date 07/05/20                 Plan - 06/21/20 1852    Clinical Impression Statement Pt exhibits improvement with maintaining SLB on RLE, holding it for approx. 8 seconds, indicating improved static balance. The pt had difficulty performing ankle rocker exercise without compensation. Pt dorsiflexion ROM assessed and found to be impaired B (see note). The pt was instructed in standing gastroc stretches to address this deficit. The pt will benefit from further skilled therapy to improve BLE strength,  ROM, and balance to increase ease and safety with all functional mobility.    Personal Factors and Comorbidities Age;Comorbidity 3+;Past/Current Experience    Comorbidities A-fib, lumbar disc disease, prostate cancer, and  history of TIA.    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    Rehab Potential Good    PT Frequency 2x / week    PT Duration 8 weeks    PT Treatment/Interventions ADLs/Self Care Home Management;Biofeedback;Aquatic Therapy;Canalith Repostioning;Cryotherapy;Electrical Stimulation;Iontophoresis 72m/ml Dexamethasone;Moist Heat;Traction;Ultrasound;Gait training;Stair training;DME Instruction;Functional mobility training;Therapeutic activities;Therapeutic exercise;Balance training;Neuromuscular re-education;Patient/family education;Manual techniques;Passive range of motion;Dry needling;Energy conservation;Vestibular;Joint Manipulations    PT Next Visit Plan dynamic balance exercises, LE stretching    Consulted and Agree with Plan of Care Patient           Patient will benefit from skilled therapeutic intervention in order to improve the following deficits and impairments:  Abnormal gait,Decreased balance,Decreased range of motion,Decreased strength,Hypomobility,Impaired sensation,Pain,Improper body mechanics,Impaired flexibility,Decreased mobility  Visit Diagnosis: Unsteadiness on feet  Other abnormalities of gait and mobility  Muscle weakness (generalized)     Problem List There are no problems to display for this patient.  HRicard DillonPT, DPT 06/21/2020, 6:54 PM  [place]ake [place]MAIN [facility]Bro[facility] HospitalSERVICES [address]RF[address] [place] [address]Phone: [phone]  Fax:  [phone] Name: Nicholas SIEFRINGMRN: [id number] of Birth: [birth year]

## 2020-06-25 ENCOUNTER — Ambulatory Visit: Payer: Medicare Other

## 2020-06-29 ENCOUNTER — Ambulatory Visit: Payer: Medicare Other

## 2020-06-29 ENCOUNTER — Other Ambulatory Visit: Payer: Self-pay

## 2020-06-29 DIAGNOSIS — M6281 Muscle weakness (generalized): Secondary | ICD-10-CM

## 2020-06-29 DIAGNOSIS — R2689 Other abnormalities of gait and mobility: Secondary | ICD-10-CM

## 2020-06-29 DIAGNOSIS — R2681 Unsteadiness on feet: Secondary | ICD-10-CM

## 2020-06-29 NOTE — Therapy (Signed)
[place] MAIN [facility] SERVICES [address] [place], [name], [address] Phone: [phone]   Fax:  [phone]  Physical Therapy Treatment  Patient Details  Name: Nicholas [name] MRN: [id number] Date of Birth: [birth year] Referring Provider (PT): Rusty Aus, MD   Encounter Date: 06/29/2020   PT End of Session - 06/29/20 1221    Visit Number 15    Number of Visits 16    Date for PT Re-Evaluation 07/05/20    Authorization - Visit Number 2    Authorization - Number of Visits 10    PT Start Time 1001    PT Stop Time 1045    PT Time Calculation (min) 44 min    Equipment Utilized During Treatment Gait belt    Activity Tolerance Patient tolerated treatment well;No increased pain    Behavior During Therapy WFL for tasks assessed/performed           Past Medical History:  Diagnosis Date  . A-fib ([place])   . Cancer ([place]) 10/25/2019   Prostate  . 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 Dysrhythmia   . Pulmonary embolism ([place])   . Skin cancer   . TIA (transient ischemic attack)     Past Surgical History:  Procedure Laterality Date  . APPENDECTOMY    . COLONOSCOPY WITH PROPOFOL N/A 11/23/2019   Procedure: COLONOSCOPY WITH PROPOFOL;  Surgeon: Toledo, Benay Pike, MD;  Location: [facility] ENDOSCOPY;  Service: Gastroenterology;  Laterality: N/A;  . cyber knife surgery    . PROSTATE SURGERY      There were no vitals filed for this visit.   Subjective Assessment - 06/29/20 1002    Subjective Pt reports he missed last appointment due to bronchitis. He says he started medication for it, but that he had to swtich medications because it made him feel loopy. Pt reports no pain currently. Pt reports his back was "feeling pretty bad for a couple days." and says it got up to 4-5/10.    Pertinent History Pt is a pleasant 75 y.o. male referred to PT for LBP which occured early February. Pt has a PMH of prostate cx, TIA, chronic a-fib, and recent hematuria.  Pt has pain with t/f from STS but feels better with walking. Pt states his LBP has been improving but his knees and hips have neem giving him more pain. Pt's LBP is a strong ache that is on the R side but denies radicular symptoms. Pt reports exercises given by MD have been beneficial and improving his LBP symptoms. Currently in no pain during subjective. when LBP is flared up he reports 4-5/10 NPS. Worst pain is 5-6/10 NPS but pt is unable to explain what causes worst pain but reports possibly most pain due sitting for long periods of time. Prednisone has improved LBP. Pt reports his goals in therapy are to improve balance although he did not mention to Dr. Sabra [name] when referred to PT for his LBP. Also wants to improve his strength. He reports he requires objects to help support him when walking such as walls or furniture. Pt denies falls, but  spouse reports a fall last fall at home where he tripped over a blanket. Has reported near falls where he needed touching of an object to correct. Pt uses a stick now and then for balance but does report difficulty transferring from sturdy to unstardy surfaces (i.e. stepping over a stream) and walking stick helps.    Limitations Standing;Walking;House hold activities  How long can you sit comfortably? Unlimited    How long can you stand comfortably? Unlimited    How long can you walk comfortably? ~1/2 mile    Patient Stated Goals Improve his balance, LE strength, hip/knee pain and stiffness    Currently in Pain? No/denies    Pain Onset In the past 7 days           TREATMENT   NEURO RE-ED: CGA-min assist provided throughout, performed at support surface  Tandem stance 4x30 sec; intermittent UE support, close CGA  Semi tandem on airex pad 6x30 sec; frequent use of UE support; frequent increase from CGA to min assist  Stepping forward/backward over hurdle 2x20; up to min assist. Pt requires intermittent UE support. Difficulty clearing hurdle.  Pt also  exhibits stepping strategy (extra step) to maintain balance when stepping backward.  Lateral-stepping over hurdle 2x20, intermittent UE support, close CGA-min assist x1.  Airex beam side stepping 2x10 back and forth each way, frequent use of UE support, min assist x1 required for majority of exercise.  Therex:  Standing gastroc stretch 2x30 sec BLEs; explanation/demo/VC/TC for technique.  Seated hamstring stretch 4x30 sec BLEs; explanation/demo/VC for technique  Seated FABER stretch - 2x30 sec BLEs; explanation/demo/VC for technique  Multiple attemps standing hip flexor stretch of BLEs, but pt reports he feels it in gastrocs. Pt with difficulty performing correct technique after demo/VC/TC.  Supine hip flexor stretch 30 sec BLEs; VC/TC for technique.    Pt provided education throughout in the form of explanation, demonstration, VC/TC to improve movement at target joints and correct muscle activation with exercises. Pt demonstrates fair-good carryover within session after cuing.   PT Short Term Goals - 06/07/20 1110      PT SHORT TERM GOAL #1   Title Patient will be independent in home exercise program to improve strength/mobility for better functional independence with ADLs.    Baseline 3/3: established HEP today. See medbridge access code; 06/07/2020 pt reports doing some of his HEP, but that he has difficulty with some of the supine exercises    Time 4    Period Weeks    Status Partially Met    Target Date 07/05/20             PT Long Term Goals - 06/07/20 1111      PT LONG TERM GOAL #1   Title Patient will increase FOTO score equal to or greater than 69 to demonstrate statistically significant improvement in mobility and quality of life.    Baseline 3/3: 55; 3/31 59%    Time 8    Period Weeks    Status On-going    Target Date 07/05/20      PT LONG TERM GOAL #2   Title Patient (> 7 years old) will complete five times sit to stand test in < 12 seconds indicating an  increased LE strength and improved balance.    Baseline 3/3: 15.38 m/s; 06/07/2020:  11 sec    Time 8    Period Weeks    Status Achieved    Target Date 07/05/20      PT LONG TERM GOAL #3   Title Pt will improve hip extension AROM > 10 deg bilaterally to normalize reciprocal gait and balance.    Baseline 3/3: R/L 4 deg/3 deg; 3/31 R/L 5 deg/3 deg    Time 8    Period Weeks    Status New    Target Date 07/05/20  PT LONG TERM GOAL #4   Title Pt will improve Berg score > 46 to display decreased risk of falls.    Baseline 3/3: Will assess upon MD approval; 06/07/2020 52/56    Time 8    Period Weeks    Status Achieved    Target Date 07/05/20      PT LONG TERM GOAL #5   Title The patient will be able to maintain at least 10 seconds of SLB on BLEs in order to decrease risk of falls when navigating obstacles, curbs and steps.    Baseline 06/07/2020: pt unable to maintain more than 3 sec of SLB without UE support    Time 4    Period Weeks    Status New    Target Date 07/05/20                 Plan - 06/29/20 1221    Clinical Impression Statement Pt continues to be challeneged by compliant surface balance training, with greatest difficulty with dynamic balance on airex beam with side-stepping. Pt requires frequent use of UE support and min assist x1 from PT to maintain balance. The pt will continue to benefit from further skilled PT to increase both static and dynamic balance, BLE strength to reduce fall risk.    Personal Factors and Comorbidities Age;Comorbidity 3+;Past/Current Experience    Comorbidities A-fib, lumbar disc disease, prostate cancer, and  history of TIA.    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    Rehab Potential Good    PT Frequency 2x / week    PT Duration 8 weeks    PT  Treatment/Interventions ADLs/Self Care Home Management;Biofeedback;Aquatic Therapy;Canalith Repostioning;Cryotherapy;Electrical Stimulation;Iontophoresis 72m/ml Dexamethasone;Moist Heat;Traction;Ultrasound;Gait training;Stair training;DME Instruction;Functional mobility training;Therapeutic activities;Therapeutic exercise;Balance training;Neuromuscular re-education;Patient/family education;Manual techniques;Passive range of motion;Dry needling;Energy conservation;Vestibular;Joint Manipulations    PT Next Visit Plan dynamic balance exercises, LE stretching; recert    Consulted and Agree with Plan of Care Patient           Patient will benefit from skilled therapeutic intervention in order to improve the following deficits and impairments:  Abnormal gait,Decreased balance,Decreased range of motion,Decreased strength,Hypomobility,Impaired sensation,Pain,Improper body mechanics,Impaired flexibility,Decreased mobility  Visit Diagnosis: Unsteadiness on feet  Other abnormalities of gait and mobility  Muscle weakness (generalized)     Problem List There are no problems to display for this patient.  HRicard DillonPT, DPT 06/29/2020, 12:32 PM  CNew LibertyMAIN RSouthwest Idaho Advanced [facility] HospitalSERVICES [address] [address]Phone: [phone]  Fax:  [phone] Name: Nicholas GRACIEMRN: [id number] of Birth: 103/02/1946

## 2020-07-02 ENCOUNTER — Other Ambulatory Visit: Payer: Self-pay

## 2020-07-02 ENCOUNTER — Ambulatory Visit: Payer: Medicare Other

## 2020-07-02 DIAGNOSIS — M6281 Muscle weakness (generalized): Secondary | ICD-10-CM

## 2020-07-02 DIAGNOSIS — M545 Low back pain, unspecified: Secondary | ICD-10-CM

## 2020-07-02 DIAGNOSIS — R2689 Other abnormalities of gait and mobility: Secondary | ICD-10-CM

## 2020-07-02 DIAGNOSIS — G8929 Other chronic pain: Secondary | ICD-10-CM

## 2020-07-02 NOTE — Therapy (Signed)
[place] MAIN [facility] SERVICES [address] [place], [name], [address] Phone: [phone]   Fax:  [phone]  Physical Therapy Treatment  Patient Details  Name: Nicholas [name] MRN: [id number] Date of Birth: 10/05/1945 Referring Provider (PT): Rusty Aus, MD   Encounter Date: 07/02/2020   PT End of Session - 07/02/20 1320    Visit Number 16    Number of Visits 16    Date for PT Re-Evaluation 07/05/20    Authorization - Visit Number 2    Authorization - Number of Visits 10    PT Start Time 3016    PT Stop Time 1230    PT Time Calculation (min) 43 min    Equipment Utilized During Treatment Gait belt    Activity Tolerance Patient tolerated treatment well;No increased pain    Behavior During Therapy WFL for tasks assessed/performed           Past Medical History:  Diagnosis Date  . A-fib ([place])   . Cancer ([place]) 10/25/2019   Prostate  . 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 Dysrhythmia   . Pulmonary embolism ([place])   . Skin cancer   . TIA (transient ischemic attack)     Past Surgical History:  Procedure Laterality Date  . APPENDECTOMY    . COLONOSCOPY WITH PROPOFOL N/A 11/23/2019   Procedure: COLONOSCOPY WITH PROPOFOL;  Surgeon: Toledo, Benay Pike, MD;  Location: [facility] ENDOSCOPY;  Service: Gastroenterology;  Laterality: N/A;  . cyber knife surgery    . PROSTATE SURGERY      There were no vitals filed for this visit.   Subjective Assessment - 07/02/20 1149    Subjective Pt states he is feeling miserable because his medicine has made him constipated. Pt reports current pain is 2/10 in his back, and reports this morning it was 6/10.    Pertinent History Pt is a pleasant 75 y.o. male referred to PT for LBP which occured early February. Pt has a PMH of prostate cx, TIA, chronic a-fib, and recent hematuria. Pt has pain with t/f from STS but feels better with walking. Pt states his LBP has been improving but his knees and hips have  neem giving him more pain. Pt's LBP is a strong ache that is on the R side but denies radicular symptoms. Pt reports exercises given by MD have been beneficial and improving his LBP symptoms. Currently in no pain during subjective. when LBP is flared up he reports 4-5/10 NPS. Worst pain is 5-6/10 NPS but pt is unable to explain what causes worst pain but reports possibly most pain due sitting for long periods of time. Prednisone has improved LBP. Pt reports his goals in therapy are to improve balance although he did not mention to Dr. Sabra [name] when referred to PT for his LBP. Also wants to improve his strength. He reports he requires objects to help support him when walking such as walls or furniture. Pt denies falls, but  spouse reports a fall last fall at home where he tripped over a blanket. Has reported near falls where he needed touching of an object to correct. Pt uses a stick now and then for balance but does report difficulty transferring from sturdy to unstardy surfaces (i.e. stepping over a stream) and walking stick helps.    Limitations Standing;Walking;House hold activities    How long can you sit comfortably? Unlimited    How long can you stand comfortably? Unlimited    How long can you  walk comfortably? ~1/2 mile    Patient Stated Goals Improve his balance, LE strength, hip/knee pain and stiffness    Currently in Pain? Yes    Pain Score 2     Pain Location Back    Pain Orientation Left;Right    Pain Onset In the past 7 days           TREATMENT   THEREX:   Physioball rollouts forward/backward - 16x. VC for technique for pt to go to end-range within pain threshold.   Physioball rollouts side-to-side - 10x each direction. VC for technique for pt to go to end-range within pain threshold as pt did report some pain initially with exercise.   Seated FABER stretch - 1x60 sec BLEs; pt reports no pain with exercise.   Seated hamstring stretch - 1x60 sec BLEs. VC/Demo for  technique.  Standing calf stretch - 2x30 sec BLEs. VC/Demo for technique. Needed to adjust step-height for LE placement/improve pt upright posture.  Supine flexor stretch - 1x60 sec BLEs  STS without UE use, CGA, 1x5, 1x9, 1x10; pt rates medium  Supine SLR - 2x10 B. VC/TC for technique. Pt reports no pain with exercise   Lower trunk rotations - 1x20s. VC/brief TC for technique. Pt reports feels relaxing to low back.  TA bracing with marching 2x20; VC for technique; pt reports "back feels better"   Education provided with VC/TC and demonstration for movement at target joints and to facilitate correct muscle activation with all exercises.     PT Education - 07/02/20 1319    Education Details exercise technique, body mechanics with SLR    Person(s) Educated Patient    Methods Explanation;Tactile cues;Verbal cues    Comprehension Verbalized understanding;Returned demonstration             PT Short Term Goals - 06/07/20 1110      PT SHORT TERM GOAL #1   Title Patient will be independent in home exercise program to improve strength/mobility for better functional independence with ADLs.    Baseline 3/3: established HEP today. See medbridge access code; 06/07/2020 pt reports doing some of his HEP, but that he has difficulty with some of the supine exercises    Time 4    Period Weeks    Status Partially Met    Target Date 07/05/20             PT Long Term Goals - 06/07/20 1111      PT LONG TERM GOAL #1   Title Patient will increase FOTO score equal to or greater than 69 to demonstrate statistically significant improvement in mobility and quality of life.    Baseline 3/3: 55; 3/31 59%    Time 8    Period Weeks    Status On-going    Target Date 07/05/20      PT LONG TERM GOAL #2   Title Patient (> 65 years old) will complete five times sit to stand test in < 12 seconds indicating an increased LE strength and improved balance.    Baseline 3/3: 15.38 m/s; 06/07/2020:  11 sec     Time 8    Period Weeks    Status Achieved    Target Date 07/05/20      PT LONG TERM GOAL #3   Title Pt will improve hip extension AROM > 10 deg bilaterally to normalize reciprocal gait and balance.    Baseline 3/3: R/L 4 deg/3 deg; 3/31 R/L 5 deg/3 deg    Time 8  Period Weeks    Status New    Target Date 07/05/20      PT LONG TERM GOAL #4   Title Pt will improve Berg score > 46 to display decreased risk of falls.    Baseline 3/3: Will assess upon MD approval; 06/07/2020 52/56    Time 8    Period Weeks    Status Achieved    Target Date 07/05/20      PT LONG TERM GOAL #5   Title The patient will be able to maintain at least 10 seconds of SLB on BLEs in order to decrease risk of falls when navigating obstacles, curbs and steps.    Baseline 06/07/2020: pt unable to maintain more than 3 sec of SLB without UE support    Time 4    Period Weeks    Status New    Target Date 07/05/20             Plan - 07/02/20 1320    Clinical Impression Statement Session focused on LE stretching and gentle motion of thoracolumbar spine as pt reporting feeling unwell today. However, after stretching pt was able to perform 3 sets of STS followed by supine therex. By end of session pt reported his back was feeling better. The pt will benefit from further skilled therapy to improve pain, BLE strength, balance, and ROM to improve functional mobility and QOL.    Personal Factors and Comorbidities Age;Comorbidity 3+;Past/Current Experience    Comorbidities A-fib, lumbar disc disease, prostate cancer, and  history of TIA.    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    Rehab Potential Good    PT Frequency 2x / week    PT Duration 8 weeks    PT Treatment/Interventions ADLs/Self Care Home Management;Biofeedback;Aquatic  Therapy;Canalith Repostioning;Cryotherapy;Electrical Stimulation;Iontophoresis 74m/ml Dexamethasone;Moist Heat;Traction;Ultrasound;Gait training;Stair training;DME Instruction;Functional mobility training;Therapeutic activities;Therapeutic exercise;Balance training;Neuromuscular re-education;Patient/family education;Manual techniques;Passive range of motion;Dry needling;Energy conservation;Vestibular;Joint Manipulations    PT Next Visit Plan dynamic balance exercises, LE stretching; RECERT/reassess goals    Consulted and Agree with Plan of Care Patient           Patient will benefit from skilled therapeutic intervention in order to improve the following deficits and impairments:  Abnormal gait,Decreased balance,Decreased range of motion,Decreased strength,Hypomobility,Impaired sensation,Pain,Improper body mechanics,Impaired flexibility,Decreased mobility  Visit Diagnosis: Other abnormalities of gait and mobility  Muscle weakness (generalized)  Chronic bilateral low back pain, unspecified whether sciatica present     Problem List There are no problems to display for this patient.  HRicard DillonPT, DPT 07/02/2020, 1:30 PM  [place] [facility] [address] [address]Phone: [phone]  Fax:  [phone] Name: CJUVENAL UMARMRN: [id number] of Birth: 123-Jan-1947

## 2020-07-05 ENCOUNTER — Ambulatory Visit: Payer: Medicare Other

## 2020-07-05 ENCOUNTER — Other Ambulatory Visit: Payer: Self-pay

## 2020-07-05 DIAGNOSIS — M6281 Muscle weakness (generalized): Secondary | ICD-10-CM

## 2020-07-05 DIAGNOSIS — R2681 Unsteadiness on feet: Secondary | ICD-10-CM

## 2020-07-05 DIAGNOSIS — R2689 Other abnormalities of gait and mobility: Secondary | ICD-10-CM

## 2020-07-05 NOTE — Therapy (Signed)
[place] MAIN [facility] SERVICES [address] [place], [name], [address] Phone: [phone]   Fax:  [phone]  Physical Therapy Treatment/RECERTIFICATION  Patient Details  Name: Nicholas [name] MRN: [id number] Date of Birth: 17-Feb-1946 Referring Provider (PT): Rusty Aus, MD   Encounter Date: 07/05/2020   PT End of Session - 07/05/20 1718    Visit Number 17    Number of Visits 32    Date for PT Re-Evaluation 08/30/20    Authorization Type recert performed 7/98, new recert for 11/29/1939    Authorization - Visit Number 2    Authorization - Number of Visits 10    PT Start Time 1146    PT Stop Time 1229    PT Time Calculation (min) 43 min    Equipment Utilized During Treatment Gait belt    Activity Tolerance Patient tolerated treatment well;No increased pain    Behavior During Therapy WFL for tasks assessed/performed           Past Medical History:  Diagnosis Date  . A-fib ([place])   . Cancer ([place]) 10/25/2019   Prostate  . 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 Dysrhythmia   . Pulmonary embolism ([place])   . Skin cancer   . TIA (transient ischemic attack)     Past Surgical History:  Procedure Laterality Date  . APPENDECTOMY    . COLONOSCOPY WITH PROPOFOL N/A 11/23/2019   Procedure: COLONOSCOPY WITH PROPOFOL;  Surgeon: Toledo, Benay Pike, MD;  Location: [facility] ENDOSCOPY;  Service: Gastroenterology;  Laterality: N/A;  . cyber knife surgery    . PROSTATE SURGERY      There were no vitals filed for this visit.   Subjective Assessment - 07/05/20 1717    Subjective Pt reports he is feeling a bit better compared to previous session but still has a cough. He say his back has not really been hurting but feels stiff in the morning. No pain currently reported.    Pertinent History Pt is a pleasant 75 y.o. male referred to PT for LBP which occured early February. Pt has a PMH of prostate cx, TIA, chronic a-fib, and recent hematuria. Pt has  pain with t/f from STS but feels better with walking. Pt states his LBP has been improving but his knees and hips have neem giving him more pain. Pt's LBP is a strong ache that is on the R side but denies radicular symptoms. Pt reports exercises given by MD have been beneficial and improving his LBP symptoms. Currently in no pain during subjective. when LBP is flared up he reports 4-5/10 NPS. Worst pain is 5-6/10 NPS but pt is unable to explain what causes worst pain but reports possibly most pain due sitting for long periods of time. Prednisone has improved LBP. Pt reports his goals in therapy are to improve balance although he did not mention to Dr. Sabra [name] when referred to PT for his LBP. Also wants to improve his strength. He reports he requires objects to help support him when walking such as walls or furniture. Pt denies falls, but  spouse reports a fall last fall at home where he tripped over a blanket. Has reported near falls where he needed touching of an object to correct. Pt uses a stick now and then for balance but does report difficulty transferring from sturdy to unstardy surfaces (i.e. stepping over a stream) and walking stick helps.    Limitations Standing;Walking;House hold activities    How long can you sit  comfortably? Unlimited    How long can you stand comfortably? Unlimited    How long can you walk comfortably? ~1/2 mile    Patient Stated Goals Improve his balance, LE strength, hip/knee pain and stiffness    Currently in Pain? No/denies    Pain Onset In the past 7 days          TREATMENT- Retesting goals for RECERT  FOTO: 93%  Hip extension: 5 deg. Extension on R, 0 deg. L side  5xSTS: Trial 1: 12.48 sec Trial 2: 10.8 sec  BERG: 55/56   SLB timed: 15 sec BLEs   DGI: 17/24  Education provided throughout session via VC/TC/demo and technique for all testing performed. Education also provided on indications of pt test scores. The pt verbalized understanding and  demonstrated proper technique with testing following cuing.  Assessment: Goals reassessed for recert. Pt continues to make progress meeting SLB goal today, FOTO score increasing to 60%, and pt reporting no back pain, just stiffness. Pt did see slight decrease in hip extension ROM compared to previous assessment, possibly affected by current gout flare-up. Pt not quite independent with HEP yet. Current HEP to also be advanced to include more challenging balance tasks, as pt tested on DGI today scoring 17/24, indicating dynamic balance impairment. Patient's condition has the potential to improve in response to therapy. Maximum improvement is yet to be obtained. The anticipated improvement is attainable and reasonable in a generally predictable time.  The pt will benefit from further skilled therapy to continue to improve strength, ROM and balance to increase ease and safety with all functional activities.     Plan - 07/05/20 1734    Clinical Impression Statement Goals reassessed for recert. Pt continues to make progress meeting SLB goal today, FOTO score increasing to 60%, and pt reporting no back pain, just stiffness. Pt did see slight decrease in hip extension ROM compared to previous assessment, possibly affected by current gout flare-up. Pt not quite independent with HEP yet. Current HEP to also be advanced to include more challenging balance tasks, as pt tested on DGI today scoring 17/24, indicating dynamic balance impairment. Patient's condition has the potential to improve in response to therapy. Maximum improvement is yet to be obtained. The anticipated improvement is attainable and reasonable in a generally predictable time.  The pt will benefit from further skilled therapy to continue to improve strength, ROM and balance to increase ease and safety with all functional activities.    Personal Factors and Comorbidities Age;Comorbidity 3+;Past/Current Experience    Comorbidities A-fib, lumbar disc  disease, prostate cancer, and  history of TIA.    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    Rehab Potential Good    PT Frequency 2x / week    PT Duration 8 weeks    PT Treatment/Interventions ADLs/Self Care Home Management;Biofeedback;Aquatic Therapy;Canalith Repostioning;Cryotherapy;Electrical Stimulation;Iontophoresis 15m/ml Dexamethasone;Moist Heat;Traction;Ultrasound;Gait training;Stair training;DME Instruction;Functional mobility training;Therapeutic activities;Therapeutic exercise;Balance training;Neuromuscular re-education;Patient/family education;Manual techniques;Passive range of motion;Dry needling;Energy conservation;Vestibular;Joint Manipulations    PT Next Visit Plan dynamic balance exercises, LE stretching; RECERT/reassess goals, advance HEP    Consulted and Agree with Plan of Care Patient             PT Short Term Goals - 07/05/20 1720      PT SHORT TERM GOAL #1   Title Patient will be independent in home exercise program to improve strength/mobility for better functional independence  with ADLs.    Baseline 3/3: established HEP today. See medbridge access code; 06/07/2020 pt reports doing some of his HEP, but that he has difficulty with some of the supine exercises; 4/28: pt not yet fully indep. with HEP    Time 4    Period Weeks    Status Partially Met    Target Date 08/02/20             PT Long Term Goals - 07/05/20 1152      PT LONG TERM GOAL #1   Title Patient will increase FOTO score equal to or greater than 69 to demonstrate statistically significant improvement in mobility and quality of life.    Baseline 3/3: 55; 3/31 59%; 4/28: 60%    Time 8    Period Weeks    Status On-going    Target Date 08/30/20      PT LONG TERM GOAL #2   Title Patient (> 66 years old) will  complete five times sit to stand test in < 12 seconds indicating an increased LE strength and improved balance.    Baseline 3/3: 15.38 m/s; 06/07/2020:  11 sec    Time 8    Period Weeks    Status Achieved      PT LONG TERM GOAL #3   Title Pt will improve hip extension AROM > 10 deg bilaterally to normalize reciprocal gait and balance.    Baseline 3/3: R/L 4 deg/3 deg; 3/31 R/L 5 deg/3 deg; 4/28: 5 deg R, 0 deg. L    Time 8    Period Weeks    Status On-going      PT LONG TERM GOAL #4   Title Pt will improve Berg score > 46 to display decreased risk of falls.    Baseline 3/3: Will assess upon MD approval; 06/07/2020 52/56; 4/28: 55/56    Time 8    Period Weeks    Status Achieved      PT LONG TERM GOAL #5   Title The patient will be able to maintain at least 10 seconds of SLB on BLEs in order to decrease risk of falls when navigating obstacles, curbs and steps.    Baseline 06/07/2020: pt unable to maintain more than 3 sec of SLB without UE support; 4/28: 15 sec BLEs    Time 4    Period Weeks    Status Achieved      Additional Long Term Goals   Additional Long Term Goals Yes      PT LONG TERM GOAL #6   Title Patient will increase dynamic gait index score to >20/24 as to demonstrate reduced fall risk and improved dynamic gait balance for better safety with community/home ambulation.    Baseline 4/28: 17/24    Time 8    Period Weeks    Status New    Target Date 08/30/20            Patient will benefit from skilled therapeutic intervention in order to improve the following deficits and impairments:  Abnormal gait,Decreased balance,Decreased range of motion,Decreased strength,Hypomobility,Impaired sensation,Pain,Improper body mechanics,Impaired flexibility,Decreased mobility  Visit Diagnosis: Unsteadiness on feet  Other abnormalities of gait and mobility  Muscle weakness (generalized)  Problem List There are no problems to display for this patient.  Ricard Dillon PT,  DPT 07/05/2020, 5:39 PM  [place] MAIN [facility] SERVICES [address] [place], [name], [address] Phone: [phone]   Fax:  [phone]  Name: CAYDEN GRANHOLM  MRN: [id number] Date of Birth: 1945-06-02

## 2020-07-09 ENCOUNTER — Ambulatory Visit: Payer: Medicare Other | Attending: Internal Medicine

## 2020-07-09 ENCOUNTER — Other Ambulatory Visit: Payer: Self-pay

## 2020-07-09 DIAGNOSIS — G8929 Other chronic pain: Secondary | ICD-10-CM | POA: Diagnosis present

## 2020-07-09 DIAGNOSIS — R2689 Other abnormalities of gait and mobility: Secondary | ICD-10-CM | POA: Insufficient documentation

## 2020-07-09 DIAGNOSIS — M545 Low back pain, unspecified: Secondary | ICD-10-CM | POA: Diagnosis present

## 2020-07-09 DIAGNOSIS — M6281 Muscle weakness (generalized): Secondary | ICD-10-CM | POA: Insufficient documentation

## 2020-07-09 DIAGNOSIS — R2681 Unsteadiness on feet: Secondary | ICD-10-CM | POA: Insufficient documentation

## 2020-07-09 NOTE — Therapy (Signed)
[place] MAIN [facility] SERVICES [address] [place], [name], [address] Phone: [phone]   Fax:  [phone]  Physical Therapy Treatment  Patient Details  Name: Nicholas [name] MRN: [id number] Date of Birth: 10-23-1945 Referring Provider (PT): Rusty Aus, MD   Encounter Date: 07/09/2020   PT End of Session - 07/09/20 1308    Visit Number 18    Number of Visits 32    Date for PT Re-Evaluation 08/30/20    Authorization Type recert performed 4/49, new recert for 6/75/9163    Authorization - Visit Number 2    Authorization - Number of Visits 10    PT Start Time 1146    PT Stop Time 1229    PT Time Calculation (min) 43 min    Equipment Utilized During Treatment Gait belt    Activity Tolerance Patient tolerated treatment well;No increased pain    Behavior During Therapy WFL for tasks assessed/performed           Past Medical History:  Diagnosis Date  . A-fib ([place])   . Cancer ([place]) 10/25/2019   Prostate  . 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 Dysrhythmia   . Pulmonary embolism ([place])   . Skin cancer   . TIA (transient ischemic attack)     Past Surgical History:  Procedure Laterality Date  . APPENDECTOMY    . COLONOSCOPY WITH PROPOFOL N/A 11/23/2019   Procedure: COLONOSCOPY WITH PROPOFOL;  Surgeon: Toledo, Benay Pike, MD;  Location: [facility] ENDOSCOPY;  Service: Gastroenterology;  Laterality: N/A;  . cyber knife surgery    . PROSTATE SURGERY      There were no vitals filed for this visit.   Subjective Assessment - 07/09/20 1306    Subjective Pt reports no pain currently. Says his sleep has been interrupted by pain in his feet at night.    Pertinent History Pt is a pleasant 75 y.o. male referred to PT for LBP which occured early February. Pt has a PMH of prostate cx, TIA, chronic a-fib, and recent hematuria. Pt has pain with t/f from STS but feels better with walking. Pt states his LBP has been improving but his knees and hips  have neem giving him more pain. Pt's LBP is a strong ache that is on the R side but denies radicular symptoms. Pt reports exercises given by MD have been beneficial and improving his LBP symptoms. Currently in no pain during subjective. when LBP is flared up he reports 4-5/10 NPS. Worst pain is 5-6/10 NPS but pt is unable to explain what causes worst pain but reports possibly most pain due sitting for long periods of time. Prednisone has improved LBP. Pt reports his goals in therapy are to improve balance although he did not mention to Dr. Sabra [name] when referred to PT for his LBP. Also wants to improve his strength. He reports he requires objects to help support him when walking such as walls or furniture. Pt denies falls, but  spouse reports a fall last fall at home where he tripped over a blanket. Has reported near falls where he needed touching of an object to correct. Pt uses a stick now and then for balance but does report difficulty transferring from sturdy to unstardy surfaces (i.e. stepping over a stream) and walking stick helps.    Limitations Standing;Walking;House hold activities    How long can you sit comfortably? Unlimited    How long can you stand comfortably? Unlimited    How long can  you walk comfortably? ~1/2 mile    Patient Stated Goals Improve his balance, LE strength, hip/knee pain and stiffness    Currently in Pain? No/denies    Pain Onset In the past 7 days          TREATMENT  Neuro Re-Education: close CGA-min assist for all of the following  Ambulation with vertical head turns, horizontal head turns, diagonal head turns 8x each  Holding red ball in front bending forward/leaning back 10x Holding red ball twisting side-to-side 10x - staggered backwards, step strategy  Holding red ball around-the-world 10x each CW/CC  Standing on airex pad: WBOS with basketball toss- 10x NBOS with basketball toss- 10x Semi-tandem with basketball toss - 10x  RW was placed in front of pt as  pt requires intermittent UE support to maintain balance with exercise.  Tandem stance at support bar - 4x30 sec each LE; intermittent UE support  SLB at support bar - 2x30 sec each LE; intermittent UE support   Education provided via VC/demonstration to facilitate improved muscle contraction and movement at target joints with all exercise. Pt exhibits good carryover within session after cuing.     PT Education - 07/09/20 1307    Education Details Pt educated on exercise technique, body mechanics    Person(s) Educated Patient    Methods Explanation;Verbal cues;Tactile cues    Comprehension Verbalized understanding;Returned demonstration            PT Short Term Goals - 07/05/20 1720      PT SHORT TERM GOAL #1   Title Patient will be independent in home exercise program to improve strength/mobility for better functional independence with ADLs.    Baseline 3/3: established HEP today. See medbridge access code; 06/07/2020 pt reports doing some of his HEP, but that he has difficulty with some of the supine exercises; 4/28: pt not yet fully indep. with HEP    Time 4    Period Weeks    Status Partially Met    Target Date 08/02/20             PT Long Term Goals - 07/05/20 1152      PT LONG TERM GOAL #1   Title Patient will increase FOTO score equal to or greater than 69 to demonstrate statistically significant improvement in mobility and quality of life.    Baseline 3/3: 55; 3/31 59%; 4/28: 60%    Time 8    Period Weeks    Status On-going    Target Date 08/30/20      PT LONG TERM GOAL #2   Title Patient (> 67 years old) will complete five times sit to stand test in < 12 seconds indicating an increased LE strength and improved balance.    Baseline 3/3: 15.38 m/s; 06/07/2020:  11 sec    Time 8    Period Weeks    Status Achieved      PT LONG TERM GOAL #3   Title Pt will improve hip extension AROM > 10 deg bilaterally to normalize reciprocal gait and balance.    Baseline 3/3:  R/L 4 deg/3 deg; 3/31 R/L 5 deg/3 deg; 4/28: 5 deg R, 0 deg. L    Time 8    Period Weeks    Status On-going      PT LONG TERM GOAL #4   Title Pt will improve Berg score > 46 to display decreased risk of falls.    Baseline 3/3: Will assess upon MD approval; 06/07/2020 52/56; 4/28:  55/56    Time 8    Period Weeks    Status Achieved      PT LONG TERM GOAL #5   Title The patient will be able to maintain at least 10 seconds of SLB on BLEs in order to decrease risk of falls when navigating obstacles, curbs and steps.    Baseline 06/07/2020: pt unable to maintain more than 3 sec of SLB without UE support; 4/28: 15 sec BLEs    Time 4    Period Weeks    Status Achieved      Additional Long Term Goals   Additional Long Term Goals Yes      PT LONG TERM GOAL #6   Title Patient will increase dynamic gait index score to >20/24 as to demonstrate reduced fall risk and improved dynamic gait balance for better safety with community/home ambulation.    Baseline 4/28: 17/24    Time 8    Period Weeks    Status New    Target Date 08/30/20                 Plan - 07/09/20 1309    Clinical Impression Statement Progressed pt on higher-level dynamic balance tasks based on pt performance of DGI last session. Pt has greatest difficulty all balance tasks involving horizontal head turns. The pt will benefit from further skilled therapy to improve both static and dynamic balance as well as BLE strength to increase safety and decrease fall risk with all activities.    Personal Factors and Comorbidities Age;Comorbidity 3+;Past/Current Experience    Comorbidities A-fib, lumbar disc disease, prostate cancer, and  history of TIA.    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    Rehab Potential Good    PT Frequency 2x / week     PT Duration 8 weeks    PT Treatment/Interventions ADLs/Self Care Home Management;Biofeedback;Aquatic Therapy;Canalith Repostioning;Cryotherapy;Electrical Stimulation;Iontophoresis 6m/ml Dexamethasone;Moist Heat;Traction;Ultrasound;Gait training;Stair training;DME Instruction;Functional mobility training;Therapeutic activities;Therapeutic exercise;Balance training;Neuromuscular re-education;Patient/family education;Manual techniques;Passive range of motion;Dry needling;Energy conservation;Vestibular;Joint Manipulations    PT Next Visit Plan dynamic balance exercises, LE stretching; RECERT/reassess goals, advance HEP, continue POC as previously indicated    Consulted and Agree with Plan of Care Patient           Patient will benefit from skilled therapeutic intervention in order to improve the following deficits and impairments:  Abnormal gait,Decreased balance,Decreased range of motion,Decreased strength,Hypomobility,Impaired sensation,Pain,Improper body mechanics,Impaired flexibility,Decreased mobility  Visit Diagnosis: Unsteadiness on feet  Muscle weakness (generalized)  Other abnormalities of gait and mobility     Problem List There are no problems to display for this patient.  HRicard DillonPT, DPT 07/09/2020, 1:13 PM  [facility]MAIN [facility]SERV[facility] [address] [address]Phone: [phone]  Fax:  [phone] Name: CCARLESTER KASPAREKMRN: [id number] of Birth: 112-11-1945

## 2020-07-12 ENCOUNTER — Ambulatory Visit: Payer: Medicare Other

## 2020-07-12 DIAGNOSIS — R2681 Unsteadiness on feet: Secondary | ICD-10-CM | POA: Diagnosis not present

## 2020-07-12 DIAGNOSIS — R2689 Other abnormalities of gait and mobility: Secondary | ICD-10-CM

## 2020-07-12 DIAGNOSIS — M6281 Muscle weakness (generalized): Secondary | ICD-10-CM

## 2020-07-12 NOTE — Therapy (Signed)
[place] MAIN [facility] SERVICES [address] [place], [name], [address] Phone: [phone]   Fax:  [phone]  Physical Therapy Treatment  Patient Details  Name: Nicholas [name] MRN: [id number] Date of Birth: [birth year] Referring Provider (PT): Rusty Aus, MD   Encounter Date: 07/12/2020   PT End of Session - 07/12/20 1844    Visit Number 19    Number of Visits 32    Date for PT Re-Evaluation 08/30/20    Authorization Type recert performed 9/40, new recert for 7/68/0881    Authorization - Visit Number 2    Authorization - Number of Visits 10    PT Start Time 1031    PT Stop Time 1228    PT Time Calculation (min) 43 min    Equipment Utilized During Treatment Gait belt    Activity Tolerance Patient tolerated treatment well    Behavior During Therapy [facility] for tasks assessed/performed           Past Medical History:  Diagnosis Date  . A-fib ([place])   . Cancer ([place]) 10/25/2019   Prostate  . 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 Dysrhythmia   . Pulmonary embolism ([place])   . Skin cancer   . TIA (transient ischemic attack)     Past Surgical History:  Procedure Laterality Date  . APPENDECTOMY    . COLONOSCOPY WITH PROPOFOL N/A 11/23/2019   Procedure: COLONOSCOPY WITH PROPOFOL;  Surgeon: Toledo, Benay Pike, MD;  Location: [facility] ENDOSCOPY;  Service: Gastroenterology;  Laterality: N/A;  . cyber knife surgery    . PROSTATE SURGERY      There were no vitals filed for this visit.   Subjective Assessment - 07/12/20 1147    Subjective Pt reports no back pain today. Says back was stiff briefly in the morning. Pt reports he walked prior to PT and is able to walk 30-40 minutes.    Pertinent History Pt is a pleasant 75 y.o. male referred to PT for LBP which occured early February. Pt has a PMH of prostate cx, TIA, chronic a-fib, and recent hematuria. Pt has pain with t/f from STS but feels better with walking. Pt states his LBP has been  improving but his knees and hips have neem giving him more pain. Pt's LBP is a strong ache that is on the R side but denies radicular symptoms. Pt reports exercises given by MD have been beneficial and improving his LBP symptoms. Currently in no pain during subjective. when LBP is flared up he reports 4-5/10 NPS. Worst pain is 5-6/10 NPS but pt is unable to explain what causes worst pain but reports possibly most pain due sitting for long periods of time. Prednisone has improved LBP. Pt reports his goals in therapy are to improve balance although he did not mention to Dr. Sabra [name] when referred to PT for his LBP. Also wants to improve his strength. He reports he requires objects to help support him when walking such as walls or furniture. Pt denies falls, but  spouse reports a fall last fall at home where he tripped over a blanket. Has reported near falls where he needed touching of an object to correct. Pt uses a stick now and then for balance but does report difficulty transferring from sturdy to unstardy surfaces (i.e. stepping over a stream) and walking stick helps.    Limitations Standing;Walking;House hold activities    How long can you sit comfortably? Unlimited    How long can you stand  comfortably? Unlimited    How long can you walk comfortably? ~1/2 mile    Patient Stated Goals Improve his balance, LE strength, hip/knee pain and stiffness    Currently in Pain? No/denies    Pain Onset In the past 7 days           TREATMENT   Neuro Re-Education: close CGA-min assist for all of the following  Ambulation in agility ladder forward and side-stepping- 10x for each back and forth. Pt multiple instances of staggering with side stepping, must use UE support and min-assist from PT  Obstacle course: walking/pivoting around cones and stepping over hurdle. Emphasis on challenging stability with turning - 8x. Pt exhibits decreased postural stability but no LOB.  Shooting hoops on airex pad: -WBOS 2x  through (12 throws) -NBOS 2x through (12 throws) - Semi-tandem for each LE - 2x through for each (12 throws). Pt exhibits greater stability when RLE is primary stance LE.  STS with feet on airex pad - 2x10. Increased weight-shift onto forefoot, intermittent UE support required.  At support bar: Semi tandem stance - 4x30 sec each LE; intermittent UE support. Improvement in postural stability with final reps.   Cone taps forward and across - x multiple reps each LE. Performed to encourage SLB.  Education provided via VC/demonstration to facilitate improved muscle contraction and movement at target joints with all exercise. Pt exhibits good carryover within session after cuing.       PT Education - 07/12/20 1844    Education Details exercise technique, body mechanics with agility ladder balance exercises    Person(s) Educated Patient    Methods Explanation;Demonstration;Verbal cues    Comprehension Verbalized understanding;Returned demonstration            PT Short Term Goals - 07/05/20 1720      PT SHORT TERM GOAL #1   Title Patient will be independent in home exercise program to improve strength/mobility for better functional independence with ADLs.    Baseline 3/3: established HEP today. See medbridge access code; 06/07/2020 pt reports doing some of his HEP, but that he has difficulty with some of the supine exercises; 4/28: pt not yet fully indep. with HEP    Time 4    Period Weeks    Status Partially Met    Target Date 08/02/20             PT Long Term Goals - 07/05/20 1152      PT LONG TERM GOAL #1   Title Patient will increase FOTO score equal to or greater than 69 to demonstrate statistically significant improvement in mobility and quality of life.    Baseline 3/3: 55; 3/31 59%; 4/28: 60%    Time 8    Period Weeks    Status On-going    Target Date 08/30/20      PT LONG TERM GOAL #2   Title Patient (> 53 years old) will complete five times sit to stand test in <  12 seconds indicating an increased LE strength and improved balance.    Baseline 3/3: 15.38 m/s; 06/07/2020:  11 sec    Time 8    Period Weeks    Status Achieved      PT LONG TERM GOAL #3   Title Pt will improve hip extension AROM > 10 deg bilaterally to normalize reciprocal gait and balance.    Baseline 3/3: R/L 4 deg/3 deg; 3/31 R/L 5 deg/3 deg; 4/28: 5 deg R, 0 deg. L  Time 8    Period Weeks    Status On-going      PT LONG TERM GOAL #4   Title Pt will improve Berg score > 46 to display decreased risk of falls.    Baseline 3/3: Will assess upon MD approval; 06/07/2020 52/56; 4/28: 55/56    Time 8    Period Weeks    Status Achieved      PT LONG TERM GOAL #5   Title The patient will be able to maintain at least 10 seconds of SLB on BLEs in order to decrease risk of falls when navigating obstacles, curbs and steps.    Baseline 06/07/2020: pt unable to maintain more than 3 sec of SLB without UE support; 4/28: 15 sec BLEs    Time 4    Period Weeks    Status Achieved      Additional Long Term Goals   Additional Long Term Goals Yes      PT LONG TERM GOAL #6   Title Patient will increase dynamic gait index score to >20/24 as to demonstrate reduced fall risk and improved dynamic gait balance for better safety with community/home ambulation.    Baseline 4/28: 17/24    Time 8    Period Weeks    Status New    Target Date 08/30/20                 Plan - 07/12/20 1852    Clinical Impression Statement Pt motivated to participate in therapy. He continues to be challenged with higher-level balance tasks and has greatest difficulty with agility ladder lateral-stepping and semi-tandem stance on airex basketball exericse. He requires up to min-assist frequently for LOB to regain balance. However, the pt did show improvement in postural stability with remaining reps of semi-tandem stance at support bar. The pt will benefit from further skilled therapy to improve BLE strength and balance  to increase safety with all functional mobility.    Personal Factors and Comorbidities Age;Comorbidity 3+;Past/Current Experience    Comorbidities A-fib, lumbar disc disease, prostate cancer, and  history of TIA.    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    Rehab Potential Good    PT Frequency 2x / week    PT Duration 8 weeks    PT Treatment/Interventions ADLs/Self Care Home Management;Biofeedback;Aquatic Therapy;Canalith Repostioning;Cryotherapy;Electrical Stimulation;Iontophoresis 21m/ml Dexamethasone;Moist Heat;Traction;Ultrasound;Gait training;Stair training;DME Instruction;Functional mobility training;Therapeutic activities;Therapeutic exercise;Balance training;Neuromuscular re-education;Patient/family education;Manual techniques;Passive range of motion;Dry needling;Energy conservation;Vestibular;Joint Manipulations    PT Next Visit Plan dynamic balance exercises, LE stretching; RECERT/reassess goals, advance HEP, continue POC as previously indicated, retest goals    Consulted and Agree with Plan of Care Patient           Patient will benefit from skilled therapeutic intervention in order to improve the following deficits and impairments:  Abnormal gait,Decreased balance,Decreased range of motion,Decreased strength,Hypomobility,Impaired sensation,Pain,Improper body mechanics,Impaired flexibility,Decreased mobility  Visit Diagnosis: Unsteadiness on feet  Muscle weakness (generalized)  Other abnormalities of gait and mobility     Problem List There are no problems to display for this patient.  HRicard DillonPT, DPT 07/12/2020, 6:56 PM  CDoyleMAIN RThe Endoscopy Center Of Northeast TennesseeSERVICES [address] [address]Phone: [phone]  Fax:  [phone] Name:  Nicholas QUEVEDOMRN: [id number] of Birth: 103-Apr-1947

## 2020-07-16 ENCOUNTER — Other Ambulatory Visit: Payer: Self-pay

## 2020-07-16 ENCOUNTER — Ambulatory Visit: Payer: Medicare Other

## 2020-07-16 DIAGNOSIS — R2681 Unsteadiness on feet: Secondary | ICD-10-CM | POA: Diagnosis not present

## 2020-07-16 DIAGNOSIS — R2689 Other abnormalities of gait and mobility: Secondary | ICD-10-CM

## 2020-07-16 DIAGNOSIS — M6281 Muscle weakness (generalized): Secondary | ICD-10-CM

## 2020-07-16 NOTE — Therapy (Addendum)
[place] MAIN [facility] SERVICES [address] [place], [name], [address] Phone: [phone]   Fax:  [phone]  Physical Therapy Treatment Physical Therapy Progress Note   Dates of reporting period  06/07/2020   to   07/16/2020   Patient Details  Name: Nicholas [name] MRN: [id number] Date of Birth: April 05, 1945 Referring Provider (PT): Rusty Aus, MD   Encounter Date: 07/16/2020   PT End of Session - 07/16/20 1219    Visit Number 20    Number of Visits 32    Date for PT Re-Evaluation 08/30/20    Authorization Type recert performed 2/70, new recert for 3/50/0938    Authorization - Visit Number 2    Authorization - Number of Visits 10    PT Start Time 1101    PT Stop Time 1147    PT Time Calculation (min) 46 min    Equipment Utilized During Treatment Gait belt    Activity Tolerance Patient tolerated treatment well    Behavior During Therapy [facility] for tasks assessed/performed           Past Medical History:  Diagnosis Date  . A-fib ([place])   . Cancer ([place]) 10/25/2019   Prostate  . 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 Dysrhythmia   . Pulmonary embolism ([place])   . Skin cancer   . TIA (transient ischemic attack)     Past Surgical History:  Procedure Laterality Date  . APPENDECTOMY    . COLONOSCOPY WITH PROPOFOL N/A 11/23/2019   Procedure: COLONOSCOPY WITH PROPOFOL;  Surgeon: Toledo, Benay Pike, MD;  Location: [facility] ENDOSCOPY;  Service: Gastroenterology;  Laterality: N/A;  . cyber knife surgery    . PROSTATE SURGERY      There were no vitals filed for this visit.   Subjective Assessment - 07/16/20 1106    Subjective Pt reports his back and legs have felt "stiffer than usual" since last session. He reports no pain currently. He feels his balance has not been as good recently.    Pertinent History Pt is a pleasant 75 y.o. male referred to PT for LBP which occured early February. Pt has a PMH of prostate cx, TIA, chronic a-fib, and  recent hematuria. Pt has pain with t/f from STS but feels better with walking. Pt states his LBP has been improving but his knees and hips have neem giving him more pain. Pt's LBP is a strong ache that is on the R side but denies radicular symptoms. Pt reports exercises given by MD have been beneficial and improving his LBP symptoms. Currently in no pain during subjective. when LBP is flared up he reports 4-5/10 NPS. Worst pain is 5-6/10 NPS but pt is unable to explain what causes worst pain but reports possibly most pain due sitting for long periods of time. Prednisone has improved LBP. Pt reports his goals in therapy are to improve balance although he did not mention to Dr. Sabra [name] when referred to PT for his LBP. Also wants to improve his strength. He reports he requires objects to help support him when walking such as walls or furniture. Pt denies falls, but  spouse reports a fall last fall at home where he tripped over a blanket. Has reported near falls where he needed touching of an object to correct. Pt uses a stick now and then for balance but does report difficulty transferring from sturdy to unstardy surfaces (i.e. stepping over a stream) and walking stick helps.    Limitations Standing;Walking;House  hold activities    How long can you sit comfortably? Unlimited    How long can you stand comfortably? Unlimited    How long can you walk comfortably? ~1/2 mile    Patient Stated Goals Improve his balance, LE strength, hip/knee pain and stiffness    Currently in Pain? No/denies    Pain Onset In the past 7 days          TREATMENT reassessment of goals  Neuro Re-education: CGA-min assist provided for the following  DGI: 19/24  Standing [place] taps to emphasis eccentric control/SLB - 3x12 BLEs  Walking backward in clinic gym and // bars - x several minutes. Pt staggers, requires up to min-assist from PT to maintain balance.  SLB- 4x30 sec BLEs  Therex:  FOTO: 56%  Hip extension ROM: approx  4-5 deg. Extension B (taken in standing today)  Seated hamstring stretch 2x30 sec B  Seated faber stretch 2x30 sec B  Handout of HEP given (reinforcing previous HEP): seated hamstring stretch, seated FABER stretch, SLB at support surface with chair behind pt    Patient's condition has the potential to improve in response to therapy. Maximum improvement is yet to be obtained. The anticipated improvement is attainable and reasonable in a generally predictable time.      Education provided throughout session via VC/TC and demonstration to facilitate movement at target joints and correct muscle activation for all testing and exercises performed. Education also provided regarding pt performance on reassessment, indications for therapy, POC.     PT Education - 07/16/20 1219    Education Details reassessment of goals, indications for PT, POC    Person(s) Educated Patient    Methods Explanation    Comprehension Verbalized understanding            PT Short Term Goals - 07/17/20 0806      PT SHORT TERM GOAL #1   Title Patient will be independent in home exercise program to improve strength/mobility for better functional independence with ADLs.    Baseline 3/3: established HEP today. See medbridge access code; 06/07/2020 pt reports doing some of his HEP, but that he has difficulty with some of the supine exercises; 4/28: pt not yet fully indep. with HEP; 5/10: pt inconsistently performing HEP    Time 4    Period Weeks    Status Partially Met    Target Date 08/02/20             PT Long Term Goals - 07/16/20 1107      PT LONG TERM GOAL #1   Title Patient will increase FOTO score equal to or greater than 69 to demonstrate statistically significant improvement in mobility and quality of life.    Baseline 3/3: 55; 3/31 59%; 4/28: 60%; 5/9: 56%    Time 8    Period Weeks    Status On-going    Target Date 08/30/20      PT LONG TERM GOAL #2   Title Patient (> 57 years old) will complete  five times sit to stand test in < 12 seconds indicating an increased LE strength and improved balance.    Baseline 3/3: 15.38 m/s; 06/07/2020:  11 sec    Time 8    Period Weeks    Status Achieved      PT LONG TERM GOAL #3   Title Pt will improve hip extension AROM > 10 deg bilaterally to normalize reciprocal gait and balance.    Baseline 3/3: R/L 4 deg/3  deg; 3/31 R/L 5 deg/3 deg; 4/28: 5 deg R, 0 deg. L; 4/9: 4-5 deg. ext B    Time 8    Period Weeks    Status On-going    Target Date 08/30/20      PT LONG TERM GOAL #4   Title Pt will improve Berg score > 46 to display decreased risk of falls.    Baseline 3/3: Will assess upon MD approval; 06/07/2020 52/56; 4/28: 55/56    Time 8    Period Weeks    Status Achieved      PT LONG TERM GOAL #5   Title The patient will be able to maintain at least 10 seconds of SLB on BLEs in order to decrease risk of falls when navigating obstacles, curbs and steps.    Baseline 06/07/2020: pt unable to maintain more than 3 sec of SLB without UE support; 4/28: 15 sec BLEs    Time 4    Period Weeks    Status Achieved      PT LONG TERM GOAL #6   Title Patient will increase dynamic gait index score to >20/24 as to demonstrate reduced fall risk and improved dynamic gait balance for better safety with community/home ambulation.    Baseline 4/28: 17/24; 5/9: 19/24    Time 8    Period Weeks    Status Partially Met    Target Date 08/30/20                 Plan - 07/16/20 1220    Clinical Impression Statement Pt making gains toward balance and ROM goals this reporting period. PT also reinforced HEP, importance of performing HEP and provided pt with new handout. Pt did see slight decrease in FOTO score to 56%. However, DGI improved to 19/24, and pt exhibits marginally improved B hip extension. Patient's condition has the potential to improve in response to therapy. Maximum improvement is yet to be obtained. The anticipated improvement is attainable and  reasonable in a generally predictable time. Pt will benefit from further skilled therapy to improve low back sx, ROM, strength and balance to increase safety with all functional mobility and improve QOL.    Personal Factors and Comorbidities Age;Comorbidity 3+;Past/Current Experience    Comorbidities A-fib, lumbar disc disease, prostate cancer, and  history of TIA.    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    Rehab Potential Good    PT Frequency 2x / week    PT Duration 8 weeks    PT Treatment/Interventions ADLs/Self Care Home Management;Biofeedback;Aquatic Therapy;Canalith Repostioning;Cryotherapy;Electrical Stimulation;Iontophoresis 46m/ml Dexamethasone;Moist Heat;Traction;Ultrasound;Gait training;Stair training;DME Instruction;Functional mobility training;Therapeutic activities;Therapeutic exercise;Balance training;Neuromuscular re-education;Patient/family education;Manual techniques;Passive range of motion;Dry needling;Energy conservation;Vestibular;Joint Manipulations    PT Next Visit Plan dynamic balance exercises, LE stretching; RECERT/reassess goals, advance HEP, continue POC as previously indicated, retest goals    Consulted and Agree with Plan of Care Patient           Patient will benefit from skilled therapeutic intervention in order to improve the following deficits and impairments:  Abnormal gait,Decreased balance,Decreased range of motion,Decreased strength,Hypomobility,Impaired sensation,Pain,Improper body mechanics,Impaired flexibility,Decreased mobility  Visit Diagnosis: Unsteadiness on feet  Other abnormalities of gait and mobility  Muscle weakness (generalized)     Problem List There are no problems to display for this patient.  HRicard DillonPT, DPT 07/17/2020, 9:07 AM  [place] [facility] [address]  [place], [name], [address] Phone: [phone]   Fax:  [phone]  Name: ELSIE SAKUMA MRN: [id number] Date of Birth: 12-07-1945

## 2020-07-19 ENCOUNTER — Other Ambulatory Visit: Payer: Self-pay

## 2020-07-19 ENCOUNTER — Ambulatory Visit: Payer: Medicare Other

## 2020-07-19 DIAGNOSIS — M6281 Muscle weakness (generalized): Secondary | ICD-10-CM

## 2020-07-19 DIAGNOSIS — R2681 Unsteadiness on feet: Secondary | ICD-10-CM

## 2020-07-19 DIAGNOSIS — R2689 Other abnormalities of gait and mobility: Secondary | ICD-10-CM

## 2020-07-19 NOTE — Therapy (Signed)
[place] MAIN [facility] SERVICES [address] [place], [name], [address] Phone: [phone]   Fax:  [phone]  Physical Therapy Treatment  Patient Details  Name: Nicholas [name] MRN: [id number] Date of Birth: November 26, 1945 Referring Provider (PT): Rusty Aus, MD   Encounter Date: 07/19/2020   PT End of Session - 07/19/20 1658    Visit Number 21    Number of Visits 32    Date for PT Re-Evaluation 08/30/20    Authorization Type recert performed 9/29, new recert for 2/44/6286    Authorization - Visit Number 2    Authorization - Number of Visits 10    PT Start Time 1148    PT Stop Time 1233    PT Time Calculation (min) 45 min    Equipment Utilized During Treatment Gait belt    Activity Tolerance Patient tolerated treatment well    Behavior During Therapy [facility] for tasks assessed/performed           Past Medical History:  Diagnosis Date  . A-fib ([place])   . Cancer ([place]) 10/25/2019   Prostate  . 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 Dysrhythmia   . Pulmonary embolism ([place])   . Skin cancer   . TIA (transient ischemic attack)     Past Surgical History:  Procedure Laterality Date  . APPENDECTOMY    . COLONOSCOPY WITH PROPOFOL N/A 11/23/2019   Procedure: COLONOSCOPY WITH PROPOFOL;  Surgeon: Toledo, Benay Pike, MD;  Location: [facility] ENDOSCOPY;  Service: Gastroenterology;  Laterality: N/A;  . cyber knife surgery    . PROSTATE SURGERY      There were no vitals filed for this visit.   Subjective Assessment - 07/19/20 1656    Subjective Pt reports he is feeling ok today. He has been performing HEP. He states his back is "feeling pretty good." He says he still has symptoms when he gets up in the morning. He reports R shoulder soreness that he thinks is due to how he naps during the day. He reports greatest difficulty with balance is getting dressed without holding on/putting on pants.    Pertinent History Pt is a pleasant 75 y.o. male  referred to PT for LBP which occured early February. Pt has a PMH of prostate cx, TIA, chronic a-fib, and recent hematuria. Pt has pain with t/f from STS but feels better with walking. Pt states his LBP has been improving but his knees and hips have neem giving him more pain. Pt's LBP is a strong ache that is on the R side but denies radicular symptoms. Pt reports exercises given by MD have been beneficial and improving his LBP symptoms. Currently in no pain during subjective. when LBP is flared up he reports 4-5/10 NPS. Worst pain is 5-6/10 NPS but pt is unable to explain what causes worst pain but reports possibly most pain due sitting for long periods of time. Prednisone has improved LBP. Pt reports his goals in therapy are to improve balance although he did not mention to Dr. Sabra [name] when referred to PT for his LBP. Also wants to improve his strength. He reports he requires objects to help support him when walking such as walls or furniture. Pt denies falls, but  spouse reports a fall last fall at home where he tripped over a blanket. Has reported near falls where he needed touching of an object to correct. Pt uses a stick now and then for balance but does report difficulty transferring from sturdy to  unstardy surfaces (i.e. stepping over a stream) and walking stick helps.    Limitations Standing;Walking;House hold activities    How long can you sit comfortably? Unlimited    How long can you stand comfortably? Unlimited    How long can you walk comfortably? ~1/2 mile    Patient Stated Goals Improve his balance, LE strength, hip/knee pain and stiffness    Currently in Pain? Yes    Pain Location Shoulder    Pain Orientation Right    Pain Onset In the past 7 days           TREATMENT   Neuro Re-education: CGA-min assist provided for the following  SLB 6x30 sec BLEs intermittent UE support, performed at support surface  Backward walking 10x of 10 meter track. Staggers with stopping and turning,  variable BOS with exercise overall. Requires up to min assist x 1 to maintain balance.   Therex:  Getting up from the floor off of mat 2x through- pt rates very "difficult," requires up to max assist x1 with second to stand back up. Education on technique with half-kneeling position, LE/UE placement, and UE assist.   Demo, explanation, VC and TC provided throughout.   Staggered STS - 2x8 each LE. Pt rates hard.  Elasto-gel applied to B knees for pain modulation 10x min. Pt with no adverse reaction to treatment.  Seated physioball rollouts forward/backward 2x10.   AAROM knee extension 10x BLEs     Education provided throughout session via VC/TC and demonstration to facilitate movement at target joints and correct muscle activation for all testing and exercises performed. Pt with fair carryover of technique within session. Will require reinforcement of technique with floor transfers.    PT Education - 07/19/20 1658    Education Details Pt educated on technique/body mechanics with getting up from floor exercise    Person(s) Educated Patient    Methods Explanation;Demonstration;Tactile cues;Verbal cues    Comprehension Verbalized understanding;Returned demonstration            PT Short Term Goals - 07/17/20 0806      PT SHORT TERM GOAL #1   Title Patient will be independent in home exercise program to improve strength/mobility for better functional independence with ADLs.    Baseline 3/3: established HEP today. See medbridge access code; 06/07/2020 pt reports doing some of his HEP, but that he has difficulty with some of the supine exercises; 4/28: pt not yet fully indep. with HEP; 5/10: pt inconsistently performing HEP    Time 4    Period Weeks    Status Partially Met    Target Date 08/02/20             PT Long Term Goals - 07/16/20 1107      PT LONG TERM GOAL #1   Title Patient will increase FOTO score equal to or greater than 69 to demonstrate statistically significant  improvement in mobility and quality of life.    Baseline 3/3: 55; 3/31 59%; 4/28: 60%; 5/9: 56%    Time 8    Period Weeks    Status On-going    Target Date 08/30/20      PT LONG TERM GOAL #2   Title Patient (> 57 years old) will complete five times sit to stand test in < 12 seconds indicating an increased LE strength and improved balance.    Baseline 3/3: 15.38 m/s; 06/07/2020:  11 sec    Time 8    Period Weeks    Status  Achieved      PT LONG TERM GOAL #3   Title Pt will improve hip extension AROM > 10 deg bilaterally to normalize reciprocal gait and balance.    Baseline 3/3: R/L 4 deg/3 deg; 3/31 R/L 5 deg/3 deg; 4/28: 5 deg R, 0 deg. L; 4/9: 4-5 deg. ext B    Time 8    Period Weeks    Status On-going    Target Date 08/30/20      PT LONG TERM GOAL #4   Title Pt will improve Berg score > 46 to display decreased risk of falls.    Baseline 3/3: Will assess upon MD approval; 06/07/2020 52/56; 4/28: 55/56    Time 8    Period Weeks    Status Achieved      PT LONG TERM GOAL #5   Title The patient will be able to maintain at least 10 seconds of SLB on BLEs in order to decrease risk of falls when navigating obstacles, curbs and steps.    Baseline 06/07/2020: pt unable to maintain more than 3 sec of SLB without UE support; 4/28: 15 sec BLEs    Time 4    Period Weeks    Status Achieved      PT LONG TERM GOAL #6   Title Patient will increase dynamic gait index score to >20/24 as to demonstrate reduced fall risk and improved dynamic gait balance for better safety with community/home ambulation.    Baseline 4/28: 17/24; 5/9: 19/24    Time 8    Period Weeks    Status Partially Met    Target Date 08/30/20                 Plan - 07/19/20 1659    Clinical Impression Statement Pt still has difficulty with SLB, although he shows improvement in amount of time he maintains it before requiring UE support. Started working on transfers from floor to standing as pt reports this is a  concern/difficult for him. Pt able to stand up from seated position on mat initially using half-kneeling position and UE support off of chair. Pt with great difficulty with second attempt when he tried to stand up from half-kneeling by pushing off of knee. He was unable to return to standing without max assist x1 from PT. Pt reported some B knee soreness afterward, so educated pt on use of ice at home. Applied elasto-gel at end of session for approx 10 min. The pt will benefit from further skilled PT to improve balance, BLE strength and transfers to increase safety with all functional mobility.    Personal Factors and Comorbidities Age;Comorbidity 3+;Past/Current Experience    Comorbidities A-fib, lumbar disc disease, prostate cancer, and  history of TIA.    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    Rehab Potential Good    PT Frequency 2x / week    PT Duration 8 weeks    PT Treatment/Interventions ADLs/Self Care Home Management;Biofeedback;Aquatic Therapy;Canalith Repostioning;Cryotherapy;Electrical Stimulation;Iontophoresis 3m/ml Dexamethasone;Moist Heat;Traction;Ultrasound;Gait training;Stair training;DME Instruction;Functional mobility training;Therapeutic activities;Therapeutic exercise;Balance training;Neuromuscular re-education;Patient/family education;Manual techniques;Passive range of motion;Dry needling;Energy conservation;Vestibular;Joint Manipulations    PT Next Visit Plan dynamic balance exercises, LE stretching; RECERT/reassess goals, advance HEP, continue POC as previously indicated, LE/UE strengthening specific to completion of floor transfers    PT Home Exercise Plan no new HEP added    Consulted and Agree with Plan of Care Patient  Patient will benefit from skilled therapeutic intervention  in order to improve the following deficits and impairments:  Abnormal gait,Decreased balance,Decreased range of motion,Decreased strength,Hypomobility,Impaired sensation,Pain,Improper body mechanics,Impaired flexibility,Decreased mobility  Visit Diagnosis: Unsteadiness on feet  Muscle weakness (generalized)  Other abnormalities of gait and mobility     Problem List There are no problems to display for this patient.  Ricard Dillon PT, DPT 07/19/2020, 5:11 PM  [place] MAIN [facility] SERVICES [address] [place], [name], [address] Phone: [phone]   Fax:  [phone]  Name: RANELL SKIBINSKI MRN: [id number] Date of Birth: Oct 05, 1945

## 2020-07-23 ENCOUNTER — Ambulatory Visit: Payer: Medicare Other

## 2020-07-23 ENCOUNTER — Other Ambulatory Visit: Payer: Self-pay

## 2020-07-23 DIAGNOSIS — M6281 Muscle weakness (generalized): Secondary | ICD-10-CM

## 2020-07-23 DIAGNOSIS — R2681 Unsteadiness on feet: Secondary | ICD-10-CM

## 2020-07-23 DIAGNOSIS — R2689 Other abnormalities of gait and mobility: Secondary | ICD-10-CM

## 2020-07-23 NOTE — Therapy (Signed)
[name], [name], [address] Phone: [phone]   Fax:  [phone]  Physical Therapy Treatment  Patient Details  Name: Nicholas [name] MRN: [id number] Date of Birth: 1946-02-02 Referring Provider (PT): Nicholas Aus, MD   Encounter Date: 07/23/2020   PT End of Session - 07/23/20 1648    Visit Number 22    Number of Visits 32    Date for PT Re-Evaluation 08/30/20    Authorization Type recert performed 2/54, new recert for 2/70/6237    Authorization - Visit Number 2    Authorization - Number of Visits 10    PT Start Time 1100    PT Stop Time 1144    PT Time Calculation (min) 44 min    Equipment Utilized During Treatment Gait belt    Activity Tolerance Patient tolerated treatment well    Behavior During Therapy [facility] for tasks assessed/performed           Past Medical History:  Diagnosis Date  . A-fib ([place])   . Cancer ([place]) 10/25/2019   Prostate  . 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 Dysrhythmia   . Pulmonary embolism ([place])   . Skin cancer   . TIA (transient ischemic attack)     Past Surgical History:  Procedure Laterality Date  . APPENDECTOMY    . COLONOSCOPY WITH PROPOFOL N/A 11/23/2019   Procedure: COLONOSCOPY WITH PROPOFOL;  Surgeon: Toledo, Benay Pike, MD;  Location: [facility] ENDOSCOPY;  Service: Gastroenterology;  Laterality: N/A;  . cyber knife surgery    . PROSTATE SURGERY      There were no vitals filed for this visit.   Subjective Assessment - 07/23/20 1102    Subjective Pt reports "yesterday I felt very awkward. I didn't sleep good. I had a lot of pain in my feet." Pt states, "my legs felt tight/swolen." He says he took an extra pill for his gout and reports legs feel better today. Pt reports no pain at the moment unless I move. He says pain is in R shoulder.    Pertinent History Pt is a pleasant 75 y.o. male referred to PT for LBP which occured early February. Pt has a PMH  of prostate cx, TIA, chronic a-fib, and recent hematuria. Pt has pain with t/f from STS but feels better with walking. Pt states his LBP has been improving but his knees and hips have neem giving him more pain. Pt's LBP is a strong ache that is on the R side but denies radicular symptoms. Pt reports exercises given by MD have been beneficial and improving his LBP symptoms. Currently in no pain during subjective. when LBP is flared up he reports 4-5/10 NPS. Worst pain is 5-6/10 NPS but pt is unable to explain what causes worst pain but reports possibly most pain due sitting for long periods of time. Prednisone has improved LBP. Pt reports his goals in therapy are to improve balance although he did not mention to Dr. Sabra [name] when referred to PT for his LBP. Also wants to improve his strength. He reports he requires objects to help support him when walking such as walls or furniture. Pt denies falls, but  spouse reports a fall last fall at home where he tripped over a blanket. Has reported near falls where he needed touching of an object to correct. Pt uses a stick now and then for balance but does report difficulty transferring from sturdy to unstardy surfaces (i.e. stepping over  a stream) and walking stick helps.    Limitations Standing;Walking;House hold activities    How long can you sit comfortably? Unlimited    How long can you stand comfortably? Unlimited    How long can you walk comfortably? ~1/2 mile    Patient Stated Goals Improve his balance, LE strength, hip/knee pain and stiffness    Currently in Pain? Yes    Pain Location Shoulder    Pain Orientation Right    Pain Onset In the past 7 days          TREATMENT    Neuro Re-education: CGA-min assist provided for the following   SLB - 6x30 sec BLEs; intermittent UE support, performed at support surface. Improvement with LLE.  Tandem stance - 4x30 sec BLEs; intermittent UE support.  Alternating toe taps onto 6" step - 3x10.   Standing feet  together airex - 60 sec  Standing feet together airex head turns 2x60 sec   Therex: CGA for all the following exercises  Attempted leg press x multiple reps, but pt with difficulty maintaining feet on press  STS - 2x10  Staggered STS - 1x6 each LE  Lunges at support surface - 1x10 each LE  Heel raises at support surface- 1x15       Education provided throughout session via VC/TC and demonstration to facilitate movement at target joints and correct muscle activation for all exercises performed. Pt with good carryover of technique within session.       PT Short Term Goals - 07/17/20 6122      PT SHORT TERM GOAL #1   Title Patient will be independent in home exercise program to improve strength/mobility for better functional independence with ADLs.    Baseline 3/3: established HEP today. See medbridge access code; 06/07/2020 pt reports doing some of his HEP, but that he has difficulty with some of the supine exercises; 4/28: pt not yet fully indep. with HEP; 5/10: pt inconsistently performing HEP    Time 4    Period Weeks    Status Partially Met    Target Date 08/02/20             PT Long Term Goals - 07/16/20 1107      PT LONG TERM GOAL #1   Title Patient will increase FOTO score equal to or greater than 69 to demonstrate statistically significant improvement in mobility and quality of life.    Baseline 3/3: 55; 3/31 59%; 4/28: 60%; 5/9: 56%    Time 8    Period Weeks    Status On-going    Target Date 08/30/20      PT LONG TERM GOAL #2   Title Patient (> 77 years old) will complete five times sit to stand test in < 12 seconds indicating an increased LE strength and improved balance.    Baseline 3/3: 15.38 m/s; 06/07/2020:  11 sec    Time 8    Period Weeks    Status Achieved      PT LONG TERM GOAL #3   Title Pt will improve hip extension AROM > 10 deg bilaterally to normalize reciprocal gait and balance.    Baseline 3/3: R/L 4 deg/3 deg; 3/31 R/L 5 deg/3 deg; 4/28:  5 deg R, 0 deg. L; 4/9: 4-5 deg. ext B    Time 8    Period Weeks    Status On-going    Target Date 08/30/20      PT LONG TERM GOAL #4  Title Pt will improve Berg score > 46 to display decreased risk of falls.    Baseline 3/3: Will assess upon MD approval; 06/07/2020 52/56; 4/28: 55/56    Time 8    Period Weeks    Status Achieved      PT LONG TERM GOAL #5   Title The patient will be able to maintain at least 10 seconds of SLB on BLEs in order to decrease risk of falls when navigating obstacles, curbs and steps.    Baseline 06/07/2020: pt unable to maintain more than 3 sec of SLB without UE support; 4/28: 15 sec BLEs    Time 4    Period Weeks    Status Achieved      PT LONG TERM GOAL #6   Title Patient will increase dynamic gait index score to >20/24 as to demonstrate reduced fall risk and improved dynamic gait balance for better safety with community/home ambulation.    Baseline 4/28: 17/24; 5/9: 19/24    Time 8    Period Weeks    Status Partially Met    Target Date 08/30/20                 Plan - 07/23/20 1654    Clinical Impression Statement Focused on LE strengthening for second half of session to address deficits found when pt tried floor transfer previous appointment. He fatigues quickly with staggered STS, lunges and shows decreased AROM with heel raises. SLB on LLE shows improvement. The pt will benefit from further skilled therapy to improve balance and LE strength to increase ease and safety with transfers and functional mobility.    Personal Factors and Comorbidities Age;Comorbidity 3+;Past/Current Experience    Comorbidities A-fib, lumbar disc disease, prostate cancer, and  history of TIA.    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    Rehab Potential Good    PT Frequency 2x / week     PT Duration 8 weeks    PT Treatment/Interventions ADLs/Self Care Home Management;Biofeedback;Aquatic Therapy;Canalith Repostioning;Cryotherapy;Electrical Stimulation;Iontophoresis 79m/ml Dexamethasone;Moist Heat;Traction;Ultrasound;Gait training;Stair training;DME Instruction;Functional mobility training;Therapeutic activities;Therapeutic exercise;Balance training;Neuromuscular re-education;Patient/family education;Manual techniques;Passive range of motion;Dry needling;Energy conservation;Vestibular;Joint Manipulations    PT Next Visit Plan dynamic balance exercises, LE stretching; RECERT/reassess goals, advance HEP, continue POC as previously indicated, LE/UE strengthening specific to completion of floor transfers, continue POC as previously indicated    PT Home Exercise Plan no new HEP added    Consulted and Agree with Plan of Care Patient           Patient will benefit from skilled therapeutic intervention in order to improve the following deficits and impairments:  Abnormal gait,Decreased balance,Decreased range of motion,Decreased strength,Hypomobility,Impaired sensation,Pain,Improper body mechanics,Impaired flexibility,Decreased mobility  Visit Diagnosis: Muscle weakness (generalized)  Unsteadiness on feet  Other abnormalities of gait and mobility     Problem List There are no problems to display for this patient.  HRicard DillonPT, DPT 07/23/2020, 5:08 PM  [place] [facility] [address] [address]Phone: [phone]  Fax:  [phone] Name: Nicholas DIEMERMRN: [id number] of Birth: 125-May-1947

## 2020-07-26 ENCOUNTER — Ambulatory Visit: Payer: Medicare Other

## 2020-07-30 ENCOUNTER — Ambulatory Visit: Payer: Medicare Other

## 2020-07-30 ENCOUNTER — Other Ambulatory Visit: Payer: Self-pay

## 2020-07-30 DIAGNOSIS — G8929 Other chronic pain: Secondary | ICD-10-CM

## 2020-07-30 DIAGNOSIS — M545 Low back pain, unspecified: Secondary | ICD-10-CM

## 2020-07-30 DIAGNOSIS — R2681 Unsteadiness on feet: Secondary | ICD-10-CM | POA: Diagnosis not present

## 2020-07-30 DIAGNOSIS — M6281 Muscle weakness (generalized): Secondary | ICD-10-CM

## 2020-07-30 NOTE — Therapy (Signed)
[place] MAIN [facility] SERVICES [address] [place], [name], [address] Phone: [phone]   Fax:  [phone]  Physical Therapy Treatment  Patient Details  Name: Nicholas [name] MRN: [id number] Date of Birth: 1945/12/04 Referring Provider (PT): Rusty Aus, MD   Encounter Date: 07/30/2020   PT End of Session - 07/30/20 1055    Visit Number 23    Number of Visits 32    Date for PT Re-Evaluation 08/30/20    Authorization Type recert performed 9/83, new recert for 3/82/5053    Authorization - Visit Number 3    Authorization - Number of Visits 10    PT Start Time 9767    PT Stop Time 1137    PT Time Calculation (min) 44 min    Equipment Utilized During Treatment Gait belt    Activity Tolerance Patient tolerated treatment well    Behavior During Therapy [facility] for tasks assessed/performed           Past Medical History:  Diagnosis Date  . A-fib ([place])   . Cancer ([place]) 10/25/2019   Prostate  . 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 Dysrhythmia   . Pulmonary embolism ([place])   . Skin cancer   . TIA (transient ischemic attack)     Past Surgical History:  Procedure Laterality Date  . APPENDECTOMY    . COLONOSCOPY WITH PROPOFOL N/A 11/23/2019   Procedure: COLONOSCOPY WITH PROPOFOL;  Surgeon: Toledo, Benay Pike, MD;  Location: [facility] ENDOSCOPY;  Service: Gastroenterology;  Laterality: N/A;  . cyber knife surgery    . PROSTATE SURGERY      There were no vitals filed for this visit.   Subjective Assessment - 07/30/20 1054    Subjective Pt denies any pain today. States his low back pain has greatly improved but the balance is "coming along". No falls. Reports difficulty sleeping when his feet start hurting.    Pertinent History Pt is a pleasant 75 y.o. male referred to PT for LBP which occured early February. Pt has a PMH of prostate cx, TIA, chronic a-fib, and recent hematuria. Pt has pain with t/f from STS but feels better with walking. Pt  states his LBP has been improving but his knees and hips have neem giving him more pain. Pt's LBP is a strong ache that is on the R side but denies radicular symptoms. Pt reports exercises given by MD have been beneficial and improving his LBP symptoms. Currently in no pain during subjective. when LBP is flared up he reports 4-5/10 NPS. Worst pain is 5-6/10 NPS but pt is unable to explain what causes worst pain but reports possibly most pain due sitting for long periods of time. Prednisone has improved LBP. Pt reports his goals in therapy are to improve balance although he did not mention to Dr. Sabra [name] when referred to PT for his LBP. Also wants to improve his strength. He reports he requires objects to help support him when walking such as walls or furniture. Pt denies falls, but  spouse reports a fall last fall at home where he tripped over a blanket. Has reported near falls where he needed touching of an object to correct. Pt uses a stick now and then for balance but does report difficulty transferring from sturdy to unstardy surfaces (i.e. stepping over a stream) and walking stick helps.    Limitations Standing;Walking;House hold activities    How long can you sit comfortably? Unlimited    How long can you  stand comfortably? Unlimited    How long can you walk comfortably? ~1/2 mile    Patient Stated Goals Improve his balance, LE strength, hip/knee pain and stiffness    Currently in Pain? No/denies    Pain Score 0-No pain    Pain Onset In the past 7 days           Therex:    STS - 2x10. CGA. VC's for improving eccentric control and no reliance on UE support. Requires use of momentum to stand.   Staggered STS with airex pad in seat- 2x6 each LE. CGA for safety. VC's for improving eccentric control with fair carryover. Noted minor SOB after due to difficulty of exercise. Intermittent multiple attempts to stand.     Neuro Re-ed: CGA-min assist provided for the following   SLS - 3x30 sec/LEs;  intermittent UE support, performed at support surface. Greater difficulty on LLE vs RLE with BUE support and greater reliance of hip strategy to correct. RLE required SUE support intermittently and greater reliance on ankle strategy.   Tandem stance - 2x30 sec BLEs; intermittent UE support and lateral sway with utilization of hip and ankle strategy to correct LOB.   Lateral hurdles step overs: 1x12/LE. 2x12/LE with 4# AW's. CGA. Increased difficulty with AW's.    Alternating foot taps on soccer ball to practice SLS and coordination to progress pt increasing time spent on stance limb due to pt not being able to rest foot on ball due to sphere shape: 1x12/LE. CGA      PT Education - 07/30/20 1055    Education Details form/technique with exercise.    Person(s) Educated Patient    Methods Explanation;Demonstration;Tactile cues;Verbal cues    Comprehension Verbalized understanding;Returned demonstration            PT Short Term Goals - 07/17/20 0806      PT SHORT TERM GOAL #1   Title Patient will be independent in home exercise program to improve strength/mobility for better functional independence with ADLs.    Baseline 3/3: established HEP today. See medbridge access code; 06/07/2020 pt reports doing some of his HEP, but that he has difficulty with some of the supine exercises; 4/28: pt not yet fully indep. with HEP; 5/10: pt inconsistently performing HEP    Time 4    Period Weeks    Status Partially Met    Target Date 08/02/20             PT Long Term Goals - 07/16/20 1107      PT LONG TERM GOAL #1   Title Patient will increase FOTO score equal to or greater than 69 to demonstrate statistically significant improvement in mobility and quality of life.    Baseline 3/3: 55; 3/31 59%; 4/28: 60%; 5/9: 56%    Time 8    Period Weeks    Status On-going    Target Date 08/30/20      PT LONG TERM GOAL #2   Title Patient (> 67 years old) will complete five times sit to stand test in <  12 seconds indicating an increased LE strength and improved balance.    Baseline 3/3: 15.38 m/s; 06/07/2020:  11 sec    Time 8    Period Weeks    Status Achieved      PT LONG TERM GOAL #3   Title Pt will improve hip extension AROM > 10 deg bilaterally to normalize reciprocal gait and balance.    Baseline 3/3: R/L 4 deg/3  deg; 3/31 R/L 5 deg/3 deg; 4/28: 5 deg R, 0 deg. L; 4/9: 4-5 deg. ext B    Time 8    Period Weeks    Status On-going    Target Date 08/30/20      PT LONG TERM GOAL #4   Title Pt will improve Berg score > 46 to display decreased risk of falls.    Baseline 3/3: Will assess upon MD approval; 06/07/2020 52/56; 4/28: 55/56    Time 8    Period Weeks    Status Achieved      PT LONG TERM GOAL #5   Title The patient will be able to maintain at least 10 seconds of SLB on BLEs in order to decrease risk of falls when navigating obstacles, curbs and steps.    Baseline 06/07/2020: pt unable to maintain more than 3 sec of SLB without UE support; 4/28: 15 sec BLEs    Time 4    Period Weeks    Status Achieved      PT LONG TERM GOAL #6   Title Patient will increase dynamic gait index score to >20/24 as to demonstrate reduced fall risk and improved dynamic gait balance for better safety with community/home ambulation.    Baseline 4/28: 17/24; 5/9: 19/24    Time 8    Period Weeks    Status Partially Met    Target Date 08/30/20                 Plan - 07/30/20 1141    Clinical Impression Statement Continuing with primary PT's POC to progress LE strength in addressing difficulty with floor transfer. Pt displays most difficulty with not utilizing UE's to push up from seat with STS and staggered STS requiring mod VC's for improving eccentric control. Today with balance pt displayed most difficulty with LLE SLS compared to RLE relying greater on hip strategy and lat sway to correct LOB with BUE assist intermittently. Pt will continue to benefit from skilled PT services to progress  balance and LE strength to improve functional mobility.    Personal Factors and Comorbidities Age;Comorbidity 3+;Past/Current Experience    Comorbidities A-fib, lumbar disc disease, prostate cancer, and  history of TIA.    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    Rehab Potential Good    PT Frequency 2x / week    PT Duration 8 weeks    PT Treatment/Interventions ADLs/Self Care Home Management;Biofeedback;Aquatic Therapy;Canalith Repostioning;Cryotherapy;Electrical Stimulation;Iontophoresis 4mg /ml Dexamethasone;Moist Heat;Traction;Ultrasound;Gait training;Stair training;DME Instruction;Functional mobility training;Therapeutic activities;Therapeutic exercise;Balance training;Neuromuscular re-education;Patient/family education;Manual techniques;Passive range of motion;Dry needling;Energy conservation;Vestibular;Joint Manipulations    PT Next Visit Plan Wants to practice again with alternating foot taps on soccer ball.    PT Home Exercise Plan no new HEP added    Consulted and Agree with Plan of Care Patient           Patient will benefit from skilled therapeutic intervention in order to improve the following deficits and impairments:  Abnormal gait,Decreased balance,Decreased range of motion,Decreased strength,Hypomobility,Impaired sensation,Pain,Improper body mechanics,Impaired flexibility,Decreased mobility  Visit Diagnosis: Muscle weakness (generalized)  Unsteadiness on feet  Chronic bilateral low back pain, unspecified whether sciatica present     Problem List There are no problems to display for this patient.   Salem Caster. Fairly IV, PT, DPT Physical Therapist- [facility]  07/30/2020, 11:48 AM  [place] MAIN  [place], [name], [address] Phone: [phone]   Fax:  [phone]  Name: Nicholas [name] MRN: [id number] Date of Birth: 04-Feb-1946

## 2020-08-02 ENCOUNTER — Other Ambulatory Visit: Payer: Self-pay

## 2020-08-02 ENCOUNTER — Ambulatory Visit: Payer: Medicare Other

## 2020-08-02 DIAGNOSIS — R2689 Other abnormalities of gait and mobility: Secondary | ICD-10-CM

## 2020-08-02 DIAGNOSIS — M6281 Muscle weakness (generalized): Secondary | ICD-10-CM

## 2020-08-02 DIAGNOSIS — R2681 Unsteadiness on feet: Secondary | ICD-10-CM | POA: Diagnosis not present

## 2020-08-02 NOTE — Therapy (Deleted)
[place] MAIN [facility] SERVICES [address] [place], [name], [address] Phone: [phone]   Fax:  [phone]  Physical Therapy Treatment  Patient Details  Name: Nicholas [name] MRN: [id number] Date of Birth: 06-11-1945 Referring Provider (PT): Rusty Aus, MD   Encounter Date: 08/02/2020    Past Medical History:  Diagnosis Date  . A-fib ([place])   . Cancer ([place]) 10/25/2019   Prostate  . 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 Dysrhythmia   . Pulmonary embolism ([place])   . Skin cancer   . TIA (transient ischemic attack)     Past Surgical History:  Procedure Laterality Date  . APPENDECTOMY    . COLONOSCOPY WITH PROPOFOL N/A 11/23/2019   Procedure: COLONOSCOPY WITH PROPOFOL;  Surgeon: Toledo, Benay Pike, MD;  Location: [facility] ENDOSCOPY;  Service: Gastroenterology;  Laterality: N/A;  . cyber knife surgery    . PROSTATE SURGERY      There were no vitals filed for this visit.   1150 Pt reports no pain today. He reports low back stiffness in the morning.   Treatment    STS SLB at bar  Lunges in // bars Standing on airex nbos, wbos, tandem throwing ball through hoop standing on airex nbos Standing on airex nbos vertical, horizontal head turns                      PT Short Term Goals - 07/17/20 0806      PT SHORT TERM GOAL #1   Title Patient will be independent in home exercise program to improve strength/mobility for better functional independence with ADLs.    Baseline 3/3: established HEP today. See medbridge access code; 06/07/2020 pt reports doing some of his HEP, but that he has difficulty with some of the supine exercises; 4/28: pt not yet fully indep. with HEP; 5/10: pt inconsistently performing HEP    Time 4    Period Weeks    Status Partially Met    Target Date 08/02/20             PT Long Term Goals - 07/16/20 1107      PT LONG TERM GOAL #1   Title Patient will increase FOTO score equal to or  greater than 69 to demonstrate statistically significant improvement in mobility and quality of life.    Baseline 3/3: 55; 3/31 59%; 4/28: 60%; 5/9: 56%    Time 8    Period Weeks    Status On-going    Target Date 08/30/20      PT LONG TERM GOAL #2   Title Patient (> 44 years old) will complete five times sit to stand test in < 12 seconds indicating an increased LE strength and improved balance.    Baseline 3/3: 15.38 m/s; 06/07/2020:  11 sec    Time 8    Period Weeks    Status Achieved      PT LONG TERM GOAL #3   Title Pt will improve hip extension AROM > 10 deg bilaterally to normalize reciprocal gait and balance.    Baseline 3/3: R/L 4 deg/3 deg; 3/31 R/L 5 deg/3 deg; 4/28: 5 deg R, 0 deg. L; 4/9: 4-5 deg. ext B    Time 8    Period Weeks    Status On-going    Target Date 08/30/20      PT LONG TERM GOAL #4   Title Pt will improve Berg score > 46 to display decreased risk  of falls.    Baseline 3/3: Will assess upon MD approval; 06/07/2020 52/56; 4/28: 55/56    Time 8    Period Weeks    Status Achieved      PT LONG TERM GOAL #5   Title The patient will be able to maintain at least 10 seconds of SLB on BLEs in order to decrease risk of falls when navigating obstacles, curbs and steps.    Baseline 06/07/2020: pt unable to maintain more than 3 sec of SLB without UE support; 4/28: 15 sec BLEs    Time 4    Period Weeks    Status Achieved      PT LONG TERM GOAL #6   Title Patient will increase dynamic gait index score to >20/24 as to demonstrate reduced fall risk and improved dynamic gait balance for better safety with community/home ambulation.    Baseline 4/28: 17/24; 5/9: 19/24    Time 8    Period Weeks    Status Partially Met    Target Date 08/30/20                  Patient will benefit from skilled therapeutic intervention in order to improve the following deficits and impairments:     Visit Diagnosis: No diagnosis found.     Problem List There are no  problems to display for this patient.   Zollie Pee 08/02/2020, 11:52 AM  [place] MAIN [facility] SERVICES [address] [place], [name], [address] Phone: [phone]   Fax:  [phone]  Name: Nicholas [name] MRN: [id number] Date of Birth: 02-Jan-1946

## 2020-08-02 NOTE — Therapy (Signed)
[place] MAIN [facility] SERVICES [address] [place], [name], [address] Phone: [phone]   Fax:  [phone]  Physical Therapy Treatment  Patient Details  Name: Nicholas [name] MRN: [id number] Date of Birth: 16-Mar-1945 Referring Provider (PT): Rusty Aus, MD   Encounter Date: 08/02/2020   PT End of Session - 08/02/20 1544    Visit Number 24    Number of Visits 32    Date for PT Re-Evaluation 08/30/20    Authorization Type recert performed 5/32, new recert for 9/92/4268    Authorization - Visit Number 3    Authorization - Number of Visits 10    PT Start Time 3419    PT Stop Time 1230    PT Time Calculation (min) 42 min    Equipment Utilized During Treatment Gait belt    Activity Tolerance Patient tolerated treatment well    Behavior During Therapy [facility] for tasks assessed/performed           Past Medical History:  Diagnosis Date  . A-fib ([place])   . Cancer ([place]) 10/25/2019   Prostate  . 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 Dysrhythmia   . Pulmonary embolism ([place])   . Skin cancer   . TIA (transient ischemic attack)     Past Surgical History:  Procedure Laterality Date  . APPENDECTOMY    . COLONOSCOPY WITH PROPOFOL N/A 11/23/2019   Procedure: COLONOSCOPY WITH PROPOFOL;  Surgeon: Toledo, Benay Pike, MD;  Location: [facility] ENDOSCOPY;  Service: Gastroenterology;  Laterality: N/A;  . cyber knife surgery    . PROSTATE SURGERY      There were no vitals filed for this visit.   Subjective Assessment - 08/02/20 1543    Subjective Pt reports he hasn't had much back pain lately, but still stiffness in the morning. He reports performing some HEP.    Pertinent History Pt is a pleasant 75 y.o. male referred to PT for LBP which occured early February. Pt has a PMH of prostate cx, TIA, chronic a-fib, and recent hematuria. Pt has pain with t/f from STS but feels better with walking. Pt states his LBP has been improving but his knees and hips  have neem giving him more pain. Pt's LBP is a strong ache that is on the R side but denies radicular symptoms. Pt reports exercises given by MD have been beneficial and improving his LBP symptoms. Currently in no pain during subjective. when LBP is flared up he reports 4-5/10 NPS. Worst pain is 5-6/10 NPS but pt is unable to explain what causes worst pain but reports possibly most pain due sitting for long periods of time. Prednisone has improved LBP. Pt reports his goals in therapy are to improve balance although he did not mention to Dr. Sabra [name] when referred to PT for his LBP. Also wants to improve his strength. He reports he requires objects to help support him when walking such as walls or furniture. Pt denies falls, but  spouse reports a fall last fall at home where he tripped over a blanket. Has reported near falls where he needed touching of an object to correct. Pt uses a stick now and then for balance but does report difficulty transferring from sturdy to unstardy surfaces (i.e. stepping over a stream) and walking stick helps.    Limitations Standing;Walking;House hold activities    How long can you sit comfortably? Unlimited    How long can you stand comfortably? Unlimited    How long can  you walk comfortably? ~1/2 mile    Patient Stated Goals Improve his balance, LE strength, hip/knee pain and stiffness    Currently in Pain? No/denies    Pain Onset In the past 7 days          Therex:  STS - 3x10 without UE support. Close CGA. Pt requires UE support 2x and up to min-assist due to LOB upon standing. Pt tendency to weight-shift posteriorly in standing.   Walking lunges in // bars - 3x6-8 up and down length of bars. Demo/VC for technique throughout. Pt using BUE support on bars. Technique improved with reps. Pt exhibits improved hip extension with reps. He reports fatigue with last set. Brief seated rest break between first and second set.   Neuro Re-Education:  On airex at support  surface: CGA-min assist provided for all of the following. -SLB 2x30 sec each LE, intermittent UE support throughout.  -NBOS 2x30 sec -NBOS with horizontal head turns 2x10 each direction. Pt requires intermittent UE support and up to min assist to maintain balance. -NBOS with vertical head turns 2x10. Pt requires intermittent UE support and up to min assist to maintain balance. -WBOS shooting ball through hoop 15x. No LOB, slight decrease in postural stability -NBOS shooting balls through hoop 10x. No LOB, improved accuracy with getting ball through hoop. -Semi-tandem stance for each LE - 10x each. Requires up to min assist to maintain balance, decreased accuracy of throws, intermittent UE support.   Education provided with VC/TC and demonstration for movement at target joints and to facilitate correct muscle activation with all exercises. Pt exhibits good carryover within session.     PT Education - 08/02/20 1543    Education Details body mechanics, exercise technique    Person(s) Educated Patient    Methods Explanation;Demonstration;Verbal cues    Comprehension Verbalized understanding;Returned demonstration            PT Short Term Goals - 07/17/20 0806      PT SHORT TERM GOAL #1   Title Patient will be independent in home exercise program to improve strength/mobility for better functional independence with ADLs.    Baseline 3/3: established HEP today. See medbridge access code; 06/07/2020 pt reports doing some of his HEP, but that he has difficulty with some of the supine exercises; 4/28: pt not yet fully indep. with HEP; 5/10: pt inconsistently performing HEP    Time 4    Period Weeks    Status Partially Met    Target Date 08/02/20             PT Long Term Goals - 07/16/20 1107      PT LONG TERM GOAL #1   Title Patient will increase FOTO score equal to or greater than 69 to demonstrate statistically significant improvement in mobility and quality of life.    Baseline  3/3: 55; 3/31 59%; 4/28: 60%; 5/9: 56%    Time 8    Period Weeks    Status On-going    Target Date 08/30/20      PT LONG TERM GOAL #2   Title Patient (> 28 years old) will complete five times sit to stand test in < 12 seconds indicating an increased LE strength and improved balance.    Baseline 3/3: 15.38 m/s; 06/07/2020:  11 sec    Time 8    Period Weeks    Status Achieved      PT LONG TERM GOAL #3   Title Pt will improve hip extension  AROM > 10 deg bilaterally to normalize reciprocal gait and balance.    Baseline 3/3: R/L 4 deg/3 deg; 3/31 R/L 5 deg/3 deg; 4/28: 5 deg R, 0 deg. L; 4/9: 4-5 deg. ext B    Time 8    Period Weeks    Status On-going    Target Date 08/30/20      PT LONG TERM GOAL #4   Title Pt will improve Berg score > 46 to display decreased risk of falls.    Baseline 3/3: Will assess upon MD approval; 06/07/2020 52/56; 4/28: 55/56    Time 8    Period Weeks    Status Achieved      PT LONG TERM GOAL #5   Title The patient will be able to maintain at least 10 seconds of SLB on BLEs in order to decrease risk of falls when navigating obstacles, curbs and steps.    Baseline 06/07/2020: pt unable to maintain more than 3 sec of SLB without UE support; 4/28: 15 sec BLEs    Time 4    Period Weeks    Status Achieved      PT LONG TERM GOAL #6   Title Patient will increase dynamic gait index score to >20/24 as to demonstrate reduced fall risk and improved dynamic gait balance for better safety with community/home ambulation.    Baseline 4/28: 17/24; 5/9: 19/24    Time 8    Period Weeks    Status Partially Met    Target Date 08/30/20                 Plan - 08/02/20 1544    Clinical Impression Statement Pt able to tolerate multiple reps of walking lunges in // bars with good technique but does fatigue at end. Pt also exhibits good balance with WBOS and NBOS on airex pad even when adding dual task such as shooting ball through hoop. He continues to be challeneged with  SLB, balance tasks with head turns, and semi-tandem stance on airex. The pt will benefit from further skilled therapy to increase BLE strength and improve balance in order to decrease fall risk and improve QOL.    Personal Factors and Comorbidities Age;Comorbidity 3+;Past/Current Experience    Comorbidities A-fib, lumbar disc disease, prostate cancer, and  history of TIA.    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    Rehab Potential Good    PT Frequency 2x / week    PT Duration 8 weeks    PT Treatment/Interventions ADLs/Self Care Home Management;Biofeedback;Aquatic Therapy;Canalith Repostioning;Cryotherapy;Electrical Stimulation;Iontophoresis 4m/ml Dexamethasone;Moist Heat;Traction;Ultrasound;Gait training;Stair training;DME Instruction;Functional mobility training;Therapeutic activities;Therapeutic exercise;Balance training;Neuromuscular re-education;Patient/family education;Manual techniques;Passive range of motion;Dry needling;Energy conservation;Vestibular;Joint Manipulations    PT Next Visit Plan Wants to practice again with alternating foot taps on soccer ball; POC as preivously indicated    PT Home Exercise Plan no new HEP added    Consulted and Agree with Plan of Care Patient           Patient will benefit from skilled therapeutic intervention in order to improve the following deficits and impairments:  Abnormal gait,Decreased balance,Decreased range of motion,Decreased strength,Hypomobility,Impaired sensation,Pain,Improper body mechanics,Impaired flexibility,Decreased mobility  Visit Diagnosis: Unsteadiness on feet  Other abnormalities of gait and mobility  Muscle weakness (generalized)     Problem List There are no problems to display for this patient.  HRicard DillonPT, DPT 08/02/2020, 3:56  PM  [place]  [facility] MAIN [facility] SERVICES [place], [name], [address] Phone: [phone]   Fax:  [phone]  Name: Nicholas [name] MRN: [id number] Date of Birth: [birth year]

## 2020-08-09 ENCOUNTER — Other Ambulatory Visit: Payer: Self-pay

## 2020-08-09 ENCOUNTER — Ambulatory Visit: Payer: Medicare Other | Attending: Internal Medicine

## 2020-08-09 DIAGNOSIS — G8929 Other chronic pain: Secondary | ICD-10-CM | POA: Diagnosis present

## 2020-08-09 DIAGNOSIS — M6281 Muscle weakness (generalized): Secondary | ICD-10-CM | POA: Insufficient documentation

## 2020-08-09 DIAGNOSIS — R2689 Other abnormalities of gait and mobility: Secondary | ICD-10-CM

## 2020-08-09 DIAGNOSIS — R2681 Unsteadiness on feet: Secondary | ICD-10-CM | POA: Diagnosis present

## 2020-08-09 DIAGNOSIS — M545 Low back pain, unspecified: Secondary | ICD-10-CM | POA: Insufficient documentation

## 2020-08-09 NOTE — Therapy (Signed)
[place] MAIN [facility] SERVICES [address] [place], [name], [address] Phone: [phone]   Fax:  [phone]  Physical Therapy Treatment  Patient Details  Name: Nicholas [name] MRN: [id number] Date of Birth: August 21, 1945 Referring Provider (PT): Rusty Aus, MD   Encounter Date: 08/09/2020   PT End of Session - 08/10/20 1058    Visit Number 25    Number of Visits 32    Date for PT Re-Evaluation 08/30/20    Authorization Type recert performed 3/30, new recert for 0/76/2263    Authorization - Visit Number 3    Authorization - Number of Visits 10    PT Start Time 1146    PT Stop Time 1229    PT Time Calculation (min) 43 min    Equipment Utilized During Treatment Gait belt    Activity Tolerance Patient tolerated treatment well    Behavior During Therapy [facility] for tasks assessed/performed           Past Medical History:  Diagnosis Date  . A-fib ([place])   . Cancer ([place]) 10/25/2019   Prostate  . 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 Dysrhythmia   . Pulmonary embolism ([place])   . Skin cancer   . TIA (transient ischemic attack)     Past Surgical History:  Procedure Laterality Date  . APPENDECTOMY    . COLONOSCOPY WITH PROPOFOL N/A 11/23/2019   Procedure: COLONOSCOPY WITH PROPOFOL;  Surgeon: Toledo, Benay Pike, MD;  Location: [facility] ENDOSCOPY;  Service: Gastroenterology;  Laterality: N/A;  . cyber knife surgery    . PROSTATE SURGERY      There were no vitals filed for this visit.   Subjective Assessment - 08/10/20 1056    Subjective Pt reports performing HEP. He states he "holds on a lot" when practicing balance exercises. He says he feels sluggish most of the time. He reports ache in back has improved.    Pertinent History Pt is a pleasant 75 y.o. male referred to PT for LBP which occured early February. Pt has a PMH of prostate cx, TIA, chronic a-fib, and recent hematuria. Pt has pain with t/f from STS but feels better with walking. Pt  states his LBP has been improving but his knees and hips have neem giving him more pain. Pt's LBP is a strong ache that is on the R side but denies radicular symptoms. Pt reports exercises given by MD have been beneficial and improving his LBP symptoms. Currently in no pain during subjective. when LBP is flared up he reports 4-5/10 NPS. Worst pain is 5-6/10 NPS but pt is unable to explain what causes worst pain but reports possibly most pain due sitting for long periods of time. Prednisone has improved LBP. Pt reports his goals in therapy are to improve balance although he did not mention to Dr. Sabra [name] when referred to PT for his LBP. Also wants to improve his strength. He reports he requires objects to help support him when walking such as walls or furniture. Pt denies falls, but  spouse reports a fall last fall at home where he tripped over a blanket. Has reported near falls where he needed touching of an object to correct. Pt uses a stick now and then for balance but does report difficulty transferring from sturdy to unstardy surfaces (i.e. stepping over a stream) and walking stick helps.    Limitations Standing;Walking;House hold activities    How long can you sit comfortably? Unlimited    How long  can you stand comfortably? Unlimited    How long can you walk comfortably? ~1/2 mile    Patient Stated Goals Improve his balance, LE strength, hip/knee pain and stiffness    Currently in Pain? No/denies    Pain Onset In the past 7 days          Treatment  Neuro Re-Ed // bars and CGA-min a provided throughout:   Tandem walking - 12x; intermittent UE support  Backwards walking - 12x; intermittent UE support  Standing hedgehog toe taps (3 hedgehogs)- 8 rounds each LE; intermittent UE support  SLB - 4x30 sec BLEs; intermittent UE support  Tandem stance - 4x30 sec BLEs  Therex:   STS (no UE assist) - 1x11, 1x12; pt rates medium; pt with improved postural stability and requiring less momentum to  stand up    Resisted walking  12.5# for 1x3 forward/backward Pt rates "easy," so progressed to 22.5# 2x12 reps forward/backward, occasional staggering where pt able to use step to maintain balance, CGA-min a provided as well.     Pt educated throughout in the form of demo/VC/TC to facilitate improved movement at target joints and correct muscle activation with exercises. The pt exhibits good carryover within session after cuing.     PT Education - 08/10/20 1057    Education Details body mechanics, exercise technique with resisted walking    Person(s) Educated Patient    Methods Explanation;Demonstration;Verbal cues    Comprehension Verbalized understanding;Returned demonstration            PT Short Term Goals - 07/17/20 0806      PT SHORT TERM GOAL #1   Title Patient will be independent in home exercise program to improve strength/mobility for better functional independence with ADLs.    Baseline 3/3: established HEP today. See medbridge access code; 06/07/2020 pt reports doing some of his HEP, but that he has difficulty with some of the supine exercises; 4/28: pt not yet fully indep. with HEP; 5/10: pt inconsistently performing HEP    Time 4    Period Weeks    Status Partially Met    Target Date 08/02/20             PT Long Term Goals - 07/16/20 1107      PT LONG TERM GOAL #1   Title Patient will increase FOTO score equal to or greater than 69 to demonstrate statistically significant improvement in mobility and quality of life.    Baseline 3/3: 55; 3/31 59%; 4/28: 60%; 5/9: 56%    Time 8    Period Weeks    Status On-going    Target Date 08/30/20      PT LONG TERM GOAL #2   Title Patient (> 64 years old) will complete five times sit to stand test in < 12 seconds indicating an increased LE strength and improved balance.    Baseline 3/3: 15.38 m/s; 06/07/2020:  11 sec    Time 8    Period Weeks    Status Achieved      PT LONG TERM GOAL #3   Title Pt will improve hip  extension AROM > 10 deg bilaterally to normalize reciprocal gait and balance.    Baseline 3/3: R/L 4 deg/3 deg; 3/31 R/L 5 deg/3 deg; 4/28: 5 deg R, 0 deg. L; 4/9: 4-5 deg. ext B    Time 8    Period Weeks    Status On-going    Target Date 08/30/20      PT  LONG TERM GOAL #4   Title Pt will improve Berg score > 46 to display decreased risk of falls.    Baseline 3/3: Will assess upon MD approval; 06/07/2020 52/56; 4/28: 55/56    Time 8    Period Weeks    Status Achieved      PT LONG TERM GOAL #5   Title The patient will be able to maintain at least 10 seconds of SLB on BLEs in order to decrease risk of falls when navigating obstacles, curbs and steps.    Baseline 06/07/2020: pt unable to maintain more than 3 sec of SLB without UE support; 4/28: 15 sec BLEs    Time 4    Period Weeks    Status Achieved      PT LONG TERM GOAL #6   Title Patient will increase dynamic gait index score to >20/24 as to demonstrate reduced fall risk and improved dynamic gait balance for better safety with community/home ambulation.    Baseline 4/28: 17/24; 5/9: 19/24    Time 8    Period Weeks    Status Partially Met    Target Date 08/30/20                 Plan - 08/10/20 1059    Clinical Impression Statement Pt shows improvement with static balance, maintaining tandem stance with LLE as primary stance leg for 30 sec without support, and 15 sec on RLE. Pt STS technique has improved as well; pt is using less momentum and shows improved postural stability when transitioning into a standing position. Pt still most challenged with SLB. He reports continued overall improvement with low back sx. Continued LE strengthening is warrented to maintain and increase gains. The pt will benefit from further skilled PT to improve BLE strength and balance to decrease risk of falls and improve QOL.    Personal Factors and Comorbidities Age;Comorbidity 3+;Past/Current Experience    Comorbidities A-fib, lumbar disc disease,  prostate cancer, and  history of TIA.    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    Rehab Potential Good    PT Frequency 2x / week    PT Duration 8 weeks    PT Treatment/Interventions ADLs/Self Care Home Management;Biofeedback;Aquatic Therapy;Canalith Repostioning;Cryotherapy;Electrical Stimulation;Iontophoresis 72m/ml Dexamethasone;Moist Heat;Traction;Ultrasound;Gait training;Stair training;DME Instruction;Functional mobility training;Therapeutic activities;Therapeutic exercise;Balance training;Neuromuscular re-education;Patient/family education;Manual techniques;Passive range of motion;Dry needling;Energy conservation;Vestibular;Joint Manipulations    PT Next Visit Plan Wants to practice again with alternating foot taps on soccer ball; POC as preivously indicated    PT Home Exercise Plan no new HEP added    Consulted and Agree with Plan of Care Patient           Patient will benefit from skilled therapeutic intervention in order to improve the following deficits and impairments:  Abnormal gait,Decreased balance,Decreased range of motion,Decreased strength,Hypomobility,Impaired sensation,Pain,Improper body mechanics,Impaired flexibility,Decreased mobility  Visit Diagnosis: Muscle weakness (generalized)  Other abnormalities of gait and mobility  Unsteadiness on feet     Problem List There are no problems to display for this patient.  HRicard DillonPT, DPT 08/10/2020, 11:08 AM  [place]MAIN [facility] CenterSERVICES [address]RHerman [place] [address]Phone: [phone]  Fax:  [phone] Name: CCHANAN DETWILERMRN: [id number] of Birth: 110-Aug-1947

## 2020-08-13 ENCOUNTER — Ambulatory Visit: Payer: Medicare Other

## 2020-08-13 ENCOUNTER — Other Ambulatory Visit: Payer: Self-pay

## 2020-08-13 DIAGNOSIS — R2681 Unsteadiness on feet: Secondary | ICD-10-CM

## 2020-08-13 DIAGNOSIS — M6281 Muscle weakness (generalized): Secondary | ICD-10-CM

## 2020-08-13 DIAGNOSIS — R2689 Other abnormalities of gait and mobility: Secondary | ICD-10-CM

## 2020-08-13 NOTE — Therapy (Signed)
[place] MAIN [facility] SERVICES [address] [place], [name], [address] Phone: [phone]   Fax:  [phone]  Physical Therapy Treatment  Patient Details  Name: Nicholas [name] MRN: [id number] Date of Birth: 1946-01-24 Referring Provider (PT): Rusty Aus, MD   Encounter Date: 08/13/2020    Past Medical History:  Diagnosis Date  . A-fib ([place])   . Cancer ([place]) 10/25/2019   Prostate  . 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 Dysrhythmia   . Pulmonary embolism ([employer])   . Skin cancer   . TIA (transient ischemic attack)     Past Surgical History:  Procedure Laterality Date  . APPENDECTOMY    . COLONOSCOPY WITH PROPOFOL N/A 11/23/2019   Procedure: COLONOSCOPY WITH PROPOFOL;  Surgeon: Toledo, Benay Pike, MD;  Location: [facility] ENDOSCOPY;  Service: Gastroenterology;  Laterality: N/A;  . cyber knife surgery    . PROSTATE SURGERY      There were no vitals filed for this visit.   Subjective Assessment - 08/13/20 1145    Subjective Pt reports sleep inconsistent over the weekend.  Pt reports he does feel better today.    Pertinent History Pt is a pleasant 75 y.o. male referred to PT for LBP which occured early February. Pt has a PMH of prostate cx, TIA, chronic a-fib, and recent hematuria. Pt has pain with t/f from STS but feels better with walking. Pt states his LBP has been improving but his knees and hips have neem giving him more pain. Pt's LBP is a strong ache that is on the R side but denies radicular symptoms. Pt reports exercises given by MD have been beneficial and improving his LBP symptoms. Currently in no pain during subjective. when LBP is flared up he reports 4-5/10 NPS. Worst pain is 5-6/10 NPS but pt is unable to explain what causes worst pain but reports possibly most pain due sitting for long periods of time. Prednisone has improved LBP. Pt reports his goals in therapy are to improve balance although he did not mention to Dr. Sabra [name]  when referred to PT for his LBP. Also wants to improve his strength. He reports he requires objects to help support him when walking such as walls or furniture. Pt denies falls, but  spouse reports a fall last fall at home where he tripped over a blanket. Has reported near falls where he needed touching of an object to correct. Pt uses a stick now and then for balance but does report difficulty transferring from sturdy to unstardy surfaces (i.e. stepping over a stream) and walking stick helps.    Limitations Standing;Walking;House hold activities    How long can you sit comfortably? Unlimited    How long can you stand comfortably? Unlimited    How long can you walk comfortably? ~1/2 mile    Patient Stated Goals Improve his balance, LE strength, hip/knee pain and stiffness    Pain Onset In the past 7 days           Treatment   Neuro Re-Ed // bars and CGA provided throughout:   Tandem walking forward/backward  - 12x; intermittent UE support on bar   Cross-over stepping - 8x back and forth, length of bars; pt rates medium, intermittent UE support  Rocker board for ankle strategies - x 3 min; pt difficulty performing with correct technique/difficulty isolating motion at ankles. Uses intermittent UE support.  Cone taps (3 cones) - x several rounds each LE; pt reports he feels comparable  difficulty on each LE. Difficulty with eccentric control, but does not kick over cones.   SLB - 2x60 sec BLEs; intermittent UE support   Tandem stance - 1x60 sec BLEs; continues to exhibit improvement. Requires less frequent use of UE support.    Therex:    STS (no UE assist) -  10x; no decrease in postural stability, no LOB. Well controlled with both eccentric and concentric component. Pt reports he has felt improvement with getting up and down from chair in church.     Staggered STS with airex pad under one LE- 6x each LE; pt rates "hard," slight decrease in postural stability, uses momentum,  intermittent UE support on // bar.     Matrix resisted walking: - 12.5# for 1x3 forward/backward, warm-up - 32.5# 1x8 forward and backward, pt rates exercise as difficult. Shows some difficulty with control of forward component more than backward walking.     Pt educated throughout in the form of demo/VC/TC to facilitate improved movement at target joints and correct muscle activation with exercises. The pt exhibits good carryover within session after cuing.      Plan - 08/13/20 1401    Clinical Impression Statement Pt continues to improve STS: no instances of LOB and good control with both concentric and eccentric components. Pt progressed to staggered STS wtih one LE on airex pad where pt requires use of momentum and intermittent UE support. Pt also able to progress with weight used for resisted walking, indicating improved LE strength. Pt is still most challenged with SLB tasks. The pt will benefit from further skilled therapy to improve BLE strength and balance to decrease fall risk and improve QOL.    Personal Factors and Comorbidities Age;Comorbidity 3+;Past/Current Experience    Comorbidities A-fib, lumbar disc disease, prostate cancer, and  history of TIA.    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    Rehab Potential Good    PT Frequency 2x / week    PT Duration 8 weeks    PT Treatment/Interventions ADLs/Self Care Home Management;Biofeedback;Aquatic Therapy;Canalith Repostioning;Cryotherapy;Electrical Stimulation;Iontophoresis 48m/ml Dexamethasone;Moist Heat;Traction;Ultrasound;Gait training;Stair training;DME Instruction;Functional mobility training;Therapeutic activities;Therapeutic exercise;Balance training;Neuromuscular re-education;Patient/family education;Manual techniques;Passive range of motion;Dry  needling;Energy conservation;Vestibular;Joint Manipulations    PT Next Visit Plan Wants to practice again with alternating foot taps on soccer ball; hip extension exercises/strethces, POC as preivously indicated    PT Home Exercise Plan no new HEP added    Consulted and Agree with Plan of Care Patient             PT Short Term Goals - 07/17/20 07591     PT SHORT TERM GOAL #1   Title Patient will be independent in home exercise program to improve strength/mobility for better functional independence with ADLs.    Baseline 3/3: established HEP today. See medbridge access code; 06/07/2020 pt reports doing some of his HEP, but that he has difficulty with some of the supine exercises; 4/28: pt not yet fully indep. with HEP; 5/10: pt inconsistently performing HEP    Time 4    Period Weeks    Status Partially Met    Target Date 08/02/20             PT Long Term Goals - 07/16/20 1107      PT LONG TERM GOAL #1   Title Patient will increase FOTO score equal to or greater than 69 to demonstrate statistically significant improvement  in mobility and quality of life.    Baseline 3/3: 55; 3/31 59%; 4/28: 60%; 5/9: 56%    Time 8    Period Weeks    Status On-going    Target Date 08/30/20      PT LONG TERM GOAL #2   Title Patient (> 1 years old) will complete five times sit to stand test in < 12 seconds indicating an increased LE strength and improved balance.    Baseline 3/3: 15.38 m/s; 06/07/2020:  11 sec    Time 8    Period Weeks    Status Achieved      PT LONG TERM GOAL #3   Title Pt will improve hip extension AROM > 10 deg bilaterally to normalize reciprocal gait and balance.    Baseline 3/3: R/L 4 deg/3 deg; 3/31 R/L 5 deg/3 deg; 4/28: 5 deg R, 0 deg. L; 4/9: 4-5 deg. ext B    Time 8    Period Weeks    Status On-going    Target Date 08/30/20      PT LONG TERM GOAL #4   Title Pt will improve Berg score > 46 to display decreased risk of falls.    Baseline 3/3: Will assess upon MD  approval; 06/07/2020 52/56; 4/28: 55/56    Time 8    Period Weeks    Status Achieved      PT LONG TERM GOAL #5   Title The patient will be able to maintain at least 10 seconds of SLB on BLEs in order to decrease risk of falls when navigating obstacles, curbs and steps.    Baseline 06/07/2020: pt unable to maintain more than 3 sec of SLB without UE support; 4/28: 15 sec BLEs    Time 4    Period Weeks    Status Achieved      PT LONG TERM GOAL #6   Title Patient will increase dynamic gait index score to >20/24 as to demonstrate reduced fall risk and improved dynamic gait balance for better safety with community/home ambulation.    Baseline 4/28: 17/24; 5/9: 19/24    Time 8    Period Weeks    Status Partially Met    Target Date 08/30/20              Patient will benefit from skilled therapeutic intervention in order to improve the following deficits and impairments:     Visit Diagnosis: No diagnosis found.     Problem List There are no problems to display for this patient.  Ricard Dillon PT, DPT 08/13/2020, 11:47 AM  [place] MAIN [facility] SERVICES [address] [place], [name], [address] Phone: [phone]   Fax:  [phone]  Name: Nicholas [name] MRN: [id number] Date of Birth: [birth year]

## 2020-08-16 ENCOUNTER — Ambulatory Visit: Payer: Medicare Other

## 2020-08-17 ENCOUNTER — Other Ambulatory Visit: Payer: Self-pay

## 2020-08-17 ENCOUNTER — Ambulatory Visit: Payer: Medicare Other

## 2020-08-17 DIAGNOSIS — R2681 Unsteadiness on feet: Secondary | ICD-10-CM

## 2020-08-17 DIAGNOSIS — M545 Low back pain, unspecified: Secondary | ICD-10-CM

## 2020-08-17 DIAGNOSIS — M6281 Muscle weakness (generalized): Secondary | ICD-10-CM

## 2020-08-17 DIAGNOSIS — G8929 Other chronic pain: Secondary | ICD-10-CM

## 2020-08-17 DIAGNOSIS — R2689 Other abnormalities of gait and mobility: Secondary | ICD-10-CM

## 2020-08-17 NOTE — Therapy (Signed)
[place] MAIN [facility] SERVICES [address] [place], [name], [address] Phone: [phone]   Fax:  [phone]  Physical Therapy Treatment  Patient Details  Name: Nicholas [name] MRN: [id number] Date of Birth: 11-19-1945 Referring Provider (PT): Rusty Aus, MD   Encounter Date: 08/17/2020   PT End of Session - 08/17/20 1049     Visit Number 27    Number of Visits 32    Date for PT Re-Evaluation 08/30/20    Authorization Type [employer], BCBS supplemental    Authorization Time Period 07/05/20- 08/30/20    PT Start Time 1043    PT Stop Time 1123    PT Time Calculation (min) 40 min    Equipment Utilized During Treatment Gait belt    Activity Tolerance Patient tolerated treatment well;No increased pain    Behavior During Therapy [facility] for tasks assessed/performed             Past Medical History:  Diagnosis Date   A-fib ([place])    Cancer ([place]) 10/25/2019   Prostate   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   Subjective Assessment - 08/17/20 1047     Subjective Pt reports better since yesterday migraine HA. He is feeling much better in general. denies any recent updates since prior visit. Pt says he no longer had much back pain. He reports he is still limited by periodic unsteadiness which he really notices mostly when performing sitting to standing transitions or truning quickly while stnading or walking.     Pertinent History Pt is a pleasant 75 y.o. male referred to PT for LBP which  occured early February. Pt has a PMH of prostate cx, TIA, chronic a-fib, and recent hematuria. Pt has pain with t/f from STS but feels better with walking. Pt states his LBP has been improving but his knees and hips have neem giving him more pain. Pt's LBP is a strong ache that is on the R side but denies radicular symptoms. Pt reports exercises given by MD have been beneficial and improving his LBP symptoms. Currently in no pain during subjective. when LBP is flared up he reports 4-5/10 NPS. Worst pain is 5-6/10 NPS but pt is unable to explain what causes worst pain but reports possibly most pain due sitting for long periods of time. Prednisone has improved LBP. Pt reports his goals in therapy are to improve balance although he did not mention to Dr. Sabra [name] when referred to PT for his LBP. Also wants to improve his strength. He reports he requires objects to help support him when walking such as walls or furniture. Pt denies falls, but  spouse reports a fall last fall at home where he tripped over a blanket. Has reported near falls where he needed touching of an object to correct. Pt uses a stick now and then for balance but does report difficulty transferring from sturdy to unstardy surfaces (i.e. stepping over a stream) and walking stick helps.  Currently in Pain? No/denies                [facility] PT Assessment - 08/17/20 0001       ROM / Strength   AROM / PROM / Strength PROM      PROM   PROM Assessment Site Knee;Ankle    Right/Left Knee Right;Left    Right Knee Flexion 123    Left Knee Flexion 126    Right/Left Ankle Right;Left    Right Ankle Dorsiflexion --   cannot keep foot flat on floor when preparing legs for STS transfer   Left Ankle Dorsiflexion --   cannot keep foot flat on floor when preparing legs for STS transfer           INTERVENTION THIS DATE: -STS from chair x10 hands free; Noted knee flexion limited to only 90 degrees bilat, which necessitates excessive thrusting of  forward trunk flexion to move COM over BOS. Appears to contribute to unsteadiness of movement. When asked pt unaware.  -STS x10 from elevated surface; Pt remains heavily deviated toward original motor patterns, has more difficulty with trunk ataxia when author attempts to correct pattern. Author notes significant limitation in Knee flexion ROM, moreso in ankle DF ROM.   -Quadruped knee flexion stretch (similar to child's pose) 2x30sec; requires airex for knees 2nd rep due to discomfort ROM taken during stretch -dual heel hang off step calf stretch 2x30sec, BUE support on rail  Both issued as HEP  -Education on how knee stiffness has resulted in a hip extension dominant motor pattern for STS transfers and how ankle DF limitations limit postural variability for transfers, particularly as surfaces become lower.  *Chief Strategy Officer also suspects some concurrent degree of contributory anterior compartment insufficiency bilat   -Motor control/postural awareness training in STS from elevtaed plinth 2x8 *no full sits, but tap only *cues for BUE forward flexion for COM balance *struggles with allowing passive anterior knee excursion at greater flexion angles, partly due to old motor habits, and partly due to suspected anterior compartment limitations *much more labored, but improved subjective involvement of quads bilat  -seated LAQ, half roll under thigh 2x13 @ 10lb (mild infrapatellar pain on left, does not inhibit)   -standing bilat jump initiation with soft landing 2x5, supervision level (partly for assessment, but also to engage total body explosive movements and high impact loading at the knee to stimulate chondrocytes and tenocytes (no pain, but yes subjective back loading)    PT Education - 08/17/20 1147     Education Details extensive education on how his knee and ankle stiffness, combined with tall stature, have created aberrant patterns for STS which are clearly contributing to unsteadiness and back  strain in transfers.    Person(s) Educated Patient    Methods Explanation;Demonstration    Comprehension Verbalized understanding              PT Short Term Goals - 07/17/20 0806       PT SHORT TERM GOAL #1   Title Patient will be independent in home exercise program to improve strength/mobility for better functional independence with ADLs.    Baseline 3/3: established HEP today. See medbridge access code; 06/07/2020 pt reports doing some of his HEP, but that he has difficulty with some of the supine exercises; 4/28: pt not yet fully indep. with HEP; 5/10: pt inconsistently performing HEP    Time 4    Period Weeks    Status Partially Met    Target Date 08/02/20  PT Long Term Goals - 07/16/20 1107       PT LONG TERM GOAL #1   Title Patient will increase FOTO score equal to or greater than 69 to demonstrate statistically significant improvement in mobility and quality of life.    Baseline 3/3: 55; 3/31 59%; 4/28: 60%; 5/9: 56%    Time 8    Period Weeks    Status On-going    Target Date 08/30/20      PT LONG TERM GOAL #2   Title Patient (> 11 years old) will complete five times sit to stand test in < 12 seconds indicating an increased LE strength and improved balance.    Baseline 3/3: 15.38 m/s; 06/07/2020:  11 sec    Time 8    Period Weeks    Status Achieved      PT LONG TERM GOAL #3   Title Pt will improve hip extension AROM > 10 deg bilaterally to normalize reciprocal gait and balance.    Baseline 3/3: R/L 4 deg/3 deg; 3/31 R/L 5 deg/3 deg; 4/28: 5 deg R, 0 deg. L; 4/9: 4-5 deg. ext B    Time 8    Period Weeks    Status On-going    Target Date 08/30/20      PT LONG TERM GOAL #4   Title Pt will improve Berg score > 46 to display decreased risk of falls.    Baseline 3/3: Will assess upon MD approval; 06/07/2020 52/56; 4/28: 55/56    Time 8    Period Weeks    Status Achieved      PT LONG TERM GOAL #5   Title The patient will be able to maintain at  least 10 seconds of SLB on BLEs in order to decrease risk of falls when navigating obstacles, curbs and steps.    Baseline 06/07/2020: pt unable to maintain more than 3 sec of SLB without UE support; 4/28: 15 sec BLEs    Time 4    Period Weeks    Status Achieved      PT LONG TERM GOAL #6   Title Patient will increase dynamic gait index score to >20/24 as to demonstrate reduced fall risk and improved dynamic gait balance for better safety with community/home ambulation.    Baseline 4/28: 17/24; 5/9: 19/24    Time 8    Period Weeks    Status Partially Met    Target Date 08/30/20                   Plan - 08/17/20 1051     Clinical Impression Statement Pt describes a strong portion of remaining unsteadiness related to STS transitions and also expresses concerns with leg weakness that impact his ability to get up from low surfaces and from the floor. Assessment reveals aberrant mechanics with transfers, heavily hip-extension dominant, with limitations imposed by knee and ankle hypomobility, all of which makes for more difficult scenario of motor control. Session spent exploring solutions for these as well as updating HEP. Pt tolerates session well in general. All questions answered. Pt will continue to benefit from skilled PT services to achieve goals of treatment.    Personal Factors and Comorbidities Age;Comorbidity 3+;Past/Current Experience    Comorbidities A-fib, lumbar disc disease, prostate cancer, and  history of TIA.    Examination-Activity Limitations Reach Overhead;Stand;Bend;Lift;Locomotion Level;Squat    Examination-Participation Restrictions Community Activity;Yard Work    Stability/Clinical Decision Making Evolving/Moderate complexity    Clinical Decision Making Moderate  Rehab Potential Good    PT Frequency 2x / week    PT Duration 8 weeks    PT Treatment/Interventions ADLs/Self Care Home Management;Biofeedback;Aquatic Therapy;Canalith  Repostioning;Cryotherapy;Electrical Stimulation;Iontophoresis 60m/ml Dexamethasone;Moist Heat;Traction;Ultrasound;Gait training;Stair training;DME Instruction;Functional mobility training;Therapeutic activities;Therapeutic exercise;Balance training;Neuromuscular re-education;Patient/family education;Manual techniques;Passive range of motion;Dry needling;Energy conservation;Vestibular;Joint Manipulations    PT Next Visit Plan Wants to practice again with alternating foot taps on soccer ball; hip extension exercises/strethces, POC as preivously indicated    PT Home Exercise Plan added quadruped knee flexion stretch and half stretch off a step today    Consulted and Agree with Plan of Care Patient             Patient will benefit from skilled therapeutic intervention in order to improve the following deficits and impairments:  Abnormal gait, Decreased balance, Decreased range of motion, Decreased strength, Hypomobility, Impaired sensation, Pain, Improper body mechanics, Impaired flexibility, Decreased mobility  Visit Diagnosis: Unsteadiness on feet  Other abnormalities of gait and mobility  Muscle weakness (generalized)  Chronic bilateral low back pain, unspecified whether sciatica present  Acute right-sided low back pain without sciatica     Problem List There are no problems to display for this patient.  12:09 PM, 08/17/20 AEtta Grandchild PT, DPT Physical Therapist - [facility]- [phone]    BEtta Grandchild6/12/2020, 11:55 AM  [place] [facility] [address] [place] [address]Phone: [phone]  Fax:  [phone] Name: Nicholas [name]: [id number] of Birth: [birth year]

## 2020-08-20 ENCOUNTER — Other Ambulatory Visit: Payer: Self-pay

## 2020-08-20 ENCOUNTER — Ambulatory Visit: Payer: Medicare Other

## 2020-08-20 DIAGNOSIS — R2681 Unsteadiness on feet: Secondary | ICD-10-CM

## 2020-08-20 DIAGNOSIS — M6281 Muscle weakness (generalized): Secondary | ICD-10-CM | POA: Diagnosis not present

## 2020-08-20 NOTE — Therapy (Signed)
[place] MAIN [facility] SERVICES [address] [place], [name], [address] Phone: [phone]   Fax:  [phone]  Physical Therapy Treatment  Patient Details  Name: Nicholas [name] MRN: [id number] Date of Birth: 03/16/1945 Referring Provider (PT): Rusty Aus, MD   Encounter Date: 08/20/2020   PT End of Session - 08/20/20 1120     Visit Number 28    Number of Visits 32    Date for PT Re-Evaluation 08/30/20    Authorization Type [employer] Medicare, BCBS supplemental    Authorization Time Period 07/05/20- 08/30/20    PT Start Time 1115    PT Stop Time 1202    PT Time Calculation (min) 47 min    Equipment Utilized During Treatment Gait belt    Activity Tolerance Patient tolerated treatment well    Behavior During Therapy [facility] for tasks assessed/performed             Past Medical History:  Diagnosis Date   A-fib ([place])    Cancer ([place]) 10/25/2019   Prostate   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   TREATMENT  THEREX:  Standing calf stretch - 2x30 sec B; R LE ankle DF greater ROM than LLE  Standing quad stretch (supported by chair) - 1x60 sec B  -STS from chair 10x hands free; Pt heels elevating from floor/difficulty with LE positioning  -Quadruped knee flexion stretch  on mat table (similar to child's pose) 4x20 sec; airex placed under knees for comfort.   Followed by another round of STS from chair (hands free), further cuing for technique - 1x10; pt rates medium. Shows improved  technique. Staggers once but maintains balance.   Seated calf stretch with wedge 1x30 sec each LE  Ankle DF without weight 20x each LE  Heel slides using pillow case under feet, emphasis on keeping heel on ground with pt applying overpressure - 10x each LE.  Ankle DF with 2# AW 20x each LE   -Another round of STS from chair 5x hands free; pt with improved ROM LLE.  -seated LAQ 2x20, 1x5 with 10# AW - pt reports no pain with exercise  Pt educated throughout in the form of demo/VC/TC to facilitate improved movement at target joints and correct muscle activation with exercises. The pt exhibits good carryover within session after cuing.      PT Education - 08/20/20 1215     Education Details Continued education on LE positioning/STS technique and technique with LE stretching/mobility exercises to improve transfer mechanics    Person(s) Educated Patient    Methods Explanation;Tactile cues;Verbal cues;Demonstration    Comprehension Returned demonstration;Verbalized understanding              PT Short Term Goals - 07/17/20 0806       PT SHORT TERM GOAL #1   Title Patient will be independent in home exercise program to improve strength/mobility for better functional independence with ADLs.    Baseline 3/3: established HEP today.  See medbridge access code; 06/07/2020 pt reports doing some of his HEP, but that he has difficulty with some of the supine exercises; 4/28: pt not yet fully indep. with HEP; 5/10: pt inconsistently performing HEP    Time 4    Period Weeks    Status Partially Met    Target Date 08/02/20               PT Long Term Goals - 07/16/20 1107       PT LONG TERM GOAL #1   Title Patient will increase FOTO score equal to or greater than 69 to demonstrate statistically significant improvement in mobility and quality of life.    Baseline 3/3: 55; 3/31 59%; 4/28: 60%; 5/9: 56%    Time 8    Period Weeks    Status On-going    Target Date 08/30/20      PT  LONG TERM GOAL #2   Title Patient (> 60 years old) will complete five times sit to stand test in < 12 seconds indicating an increased LE strength and improved balance.    Baseline 3/3: 15.38 m/s; 06/07/2020:  11 sec    Time 8    Period Weeks    Status Achieved      PT LONG TERM GOAL #3   Title Pt will improve hip extension AROM > 10 deg bilaterally to normalize reciprocal gait and balance.    Baseline 3/3: R/L 4 deg/3 deg; 3/31 R/L 5 deg/3 deg; 4/28: 5 deg R, 0 deg. L; 4/9: 4-5 deg. ext B    Time 8    Period Weeks    Status On-going    Target Date 08/30/20      PT LONG TERM GOAL #4   Title Pt will improve Berg score > 46 to display decreased risk of falls.    Baseline 3/3: Will assess upon MD approval; 06/07/2020 52/56; 4/28: 55/56    Time 8    Period Weeks    Status Achieved      PT LONG TERM GOAL #5   Title The patient will be able to maintain at least 10 seconds of SLB on BLEs in order to decrease risk of falls when navigating obstacles, curbs and steps.    Baseline 06/07/2020: pt unable to maintain more than 3 sec of SLB without UE support; 4/28: 15 sec BLEs    Time 4    Period Weeks    Status Achieved      PT LONG TERM GOAL #6   Title Patient will increase dynamic gait index score to >20/24 as to demonstrate reduced fall risk and improved dynamic gait balance for better safety with community/home ambulation.    Baseline 4/28: 17/24; 5/9: 19/24    Time 8    Period Weeks    Status Partially Met    Target Date 08/30/20                   Plan - 08/20/20 1217     Clinical Impression Statement Pt shows consistent eccentric control with STS technique this session. He still has difficulty, however, with sitting>standing d/t joint stiffness of knees and ankles. PT reinforced this session importance of stretching for improved LE mobility. After stretching, and mobilty exercises targeting knee and ankle motion, pt with some within-session improvement with sit>stand technique  (improved motion more on LLE than R where pt able to mantain L heel on floor throughout movement). Pt will benefit from further skilled PT to  improve BLE strenght, ROM and balance in order to decrease fall risk and improve QOL.    Personal Factors and Comorbidities Age;Comorbidity 3+;Past/Current Experience    Comorbidities A-fib, lumbar disc disease, prostate cancer, and  history of TIA.    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    Rehab Potential Good    PT Frequency 2x / week    PT Duration 8 weeks    PT Treatment/Interventions ADLs/Self Care Home Management;Biofeedback;Aquatic Therapy;Canalith Repostioning;Cryotherapy;Electrical Stimulation;Iontophoresis 24m/ml Dexamethasone;Moist Heat;Traction;Ultrasound;Gait training;Stair training;DME Instruction;Functional mobility training;Therapeutic activities;Therapeutic exercise;Balance training;Neuromuscular re-education;Patient/family education;Manual techniques;Passive range of motion;Dry needling;Energy conservation;Vestibular;Joint Manipulations    PT Next Visit Plan Wants to practice again with alternating foot taps on soccer ball; hip extension exercises/strethces, POC as preivously indicated    PT Home Exercise Plan added quadruped knee flexion stretch and half stretch off a step today    Consulted and Agree with Plan of Care Patient             Patient will benefit from skilled therapeutic intervention in order to improve the following deficits and impairments:  Abnormal gait, Decreased balance, Decreased range of motion, Decreased strength, Hypomobility, Impaired sensation, Pain, Improper body mechanics, Impaired flexibility, Decreased mobility  Visit Diagnosis: Unsteadiness on feet  Muscle weakness (generalized)     Problem List There are no  problems to display for this patient.  HRicard DillonPT, DPT 08/20/2020, 12:23 PM  [place] [facility]saSERVICES [address] [address]Phone: [phone]  Fax:  [phone] Name: Nicholas COLESONMRN: [id number] of Birth: [birth year]

## 2020-08-23 ENCOUNTER — Other Ambulatory Visit: Payer: Self-pay

## 2020-08-23 ENCOUNTER — Ambulatory Visit: Payer: Medicare Other

## 2020-08-23 DIAGNOSIS — M6281 Muscle weakness (generalized): Secondary | ICD-10-CM

## 2020-08-23 DIAGNOSIS — R2689 Other abnormalities of gait and mobility: Secondary | ICD-10-CM

## 2020-08-23 DIAGNOSIS — R2681 Unsteadiness on feet: Secondary | ICD-10-CM

## 2020-08-23 NOTE — Therapy (Signed)
[place] MAIN [facility] SERVICES [address] [place], [name], [address] Phone: [phone]   Fax:  [phone]  Physical Therapy Treatment  Patient Details  Name: Nicholas [name] MRN: [id number] Date of Birth: 07-May-1945 Referring Provider (PT): Rusty Aus, MD   Encounter Date: 08/23/2020   PT End of Session - 08/23/20 1700     Visit Number 29    Number of Visits 32    Date for PT Re-Evaluation 08/30/20    Authorization Type [employer] Medicare, BCBS supplemental    Authorization Time Period 07/05/20- 08/30/20    PT Start Time 1150    PT Stop Time 1231    PT Time Calculation (min) 41 min    Equipment Utilized During Treatment Gait belt    Activity Tolerance Patient tolerated treatment well    Behavior During Therapy [facility] for tasks assessed/performed             Past Medical History:  Diagnosis Date   A-fib ([place])    Cancer ([place]) 10/25/2019   Prostate   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   Subjective Assessment - 08/23/20 1150     Subjective Pt reports no pain after last appointment. Pt reports no back pain. Pt reports no falls or near falls. Pt reports he has not been doing much of his HEP.    Pertinent History Pt is a pleasant 75 y.o. male referred to PT for LBP which occured early February. Pt has a PMH of prostate cx, TIA, chronic a-fib, and recent hematuria. Pt has pain with t/f from STS but feels better with walking. Pt states his LBP has been improving but his knees and hips  have neem giving him more pain. Pt's LBP is a strong ache that is on the R side but denies radicular symptoms. Pt reports exercises given by MD have been beneficial and improving his LBP symptoms. Currently in no pain during subjective. when LBP is flared up he reports 4-5/10 NPS. Worst pain is 5-6/10 NPS but pt is unable to explain what causes worst pain but reports possibly most pain due sitting for long periods of time. Prednisone has improved LBP. Pt reports his goals in therapy are to improve balance although he did not mention to Dr. Sabra [name] when referred to PT for his LBP. Also wants to improve his strength. He reports he requires objects to help support him when walking such as walls or furniture. Pt denies falls, but  spouse reports a fall last fall at home where he tripped over a blanket. Has reported near falls where he needed touching of an object to correct. Pt uses a stick now and then for balance but does report difficulty transferring from sturdy to unstardy surfaces (i.e. stepping over a stream) and walking stick helps.    Currently in Pain? No/denies           TREATMENT -  Neuro Re-Ed  // bars and CGA provided throughout for all  exercises-  Tandem stance 30-45 sec each LE  High knee marches followed by increased amplitude backwards stepping - 10x length of bars. Pt requires intermittent UE support d/t decreased postural stability.  Standing on airex and other foot on 4" step (progression for SLB) - 4x30 sec each LE; increasingly steady with reps, and shows improved stability with RLE as stance leg  SLB progression - one foot on floor one on soccer ball - forward/backward, CW/CC - 4x30 sec each LE; intermittent UE support.   Stepping over orange hurdle forward/back - 2x15. Pt struggles with posterior stepping, requires multiple small steps to maintain balance.   Agility ladder forward/backward stepping - 8x length of bars  Agility ladder with orange hurdle forward/backward  stepping - 6x- continued difficulty with stepping over hurdle backwards.   Agility ladder with side-stepping and side stepping over orange hurdle - 4x for each. No LOB. Does not require UE support.  STS with airex under feet and pt seated on dynadisc on chair - 10x, 2x5. Significant improvement in postural stability.   SLB 30 sec each LE- requires intermittent UE support    Pt educated throughout in the form of demo/VC/TC to facilitate improved movement at target joints and correct muscle activation with exercises. The pt exhibits good carryover within session after cuing.           PT Short Term Goals - 07/17/20 6468       PT SHORT TERM GOAL #1   Title Patient will be independent in home exercise program to improve strength/mobility for better functional independence with ADLs.    Baseline 3/3: established HEP today. See medbridge access code; 06/07/2020 pt reports doing some of his HEP, but that he has difficulty with some of the supine exercises; 4/28: pt not yet fully indep. with HEP; 5/10: pt inconsistently performing HEP    Time 4    Period Weeks    Status Partially Met    Target Date 08/02/20               PT Long Term Goals - 07/16/20 1107       PT LONG TERM GOAL #1   Title Patient will increase FOTO score equal to or greater than 69 to demonstrate statistically significant improvement in mobility and quality of life.    Baseline 3/3: 55; 3/31 59%; 4/28: 60%; 5/9: 56%    Time 8    Period Weeks    Status On-going    Target Date 08/30/20      PT LONG TERM GOAL #2   Title Patient (> 31 years old) will complete five times sit to stand test in < 12 seconds indicating an increased LE strength and improved balance.    Baseline 3/3: 15.38 m/s; 06/07/2020:  11 sec    Time 8    Period Weeks    Status Achieved      PT LONG TERM GOAL #3   Title Pt will improve hip extension AROM > 10 deg bilaterally to normalize reciprocal gait and balance.    Baseline 3/3: R/L 4  deg/3 deg; 3/31 R/L 5 deg/3 deg; 4/28: 5 deg R, 0 deg. L; 4/9: 4-5 deg. ext B    Time 8    Period Weeks    Status On-going    Target Date 08/30/20      PT LONG TERM GOAL #4   Title Pt will improve Berg score > 46 to display decreased risk of falls.    Baseline 3/3:  Will assess upon MD approval; 06/07/2020 52/56; 4/28: 55/56    Time 8    Period Weeks    Status Achieved      PT LONG TERM GOAL #5   Title The patient will be able to maintain at least 10 seconds of SLB on BLEs in order to decrease risk of falls when navigating obstacles, curbs and steps.    Baseline 06/07/2020: pt unable to maintain more than 3 sec of SLB without UE support; 4/28: 15 sec BLEs    Time 4    Period Weeks    Status Achieved      PT LONG TERM GOAL #6   Title Patient will increase dynamic gait index score to >20/24 as to demonstrate reduced fall risk and improved dynamic gait balance for better safety with community/home ambulation.    Baseline 4/28: 17/24; 5/9: 19/24    Time 8    Period Weeks    Status Partially Met    Target Date 08/30/20                   Plan - 08/23/20 1810     Clinical Impression Statement Pt continues to show progress with improved postural stability with SLB progression exercises, requiring less UE support and assist from PT. Pt most challenged today with retro stepping over hurdle, requiring multiple little steps to maintain balance. The pt will benefit from further skilled PT to improve LE strength and balance to decrease fall risk.    Personal Factors and Comorbidities Age;Comorbidity 3+;Past/Current Experience    Comorbidities A-fib, lumbar disc disease, prostate cancer, and  history of TIA.    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    Rehab Potential Good    PT Frequency 2x / week     PT Duration 8 weeks    PT Treatment/Interventions ADLs/Self Care Home Management;Biofeedback;Aquatic Therapy;Canalith Repostioning;Cryotherapy;Electrical Stimulation;Iontophoresis 37m/ml Dexamethasone;Moist Heat;Traction;Ultrasound;Gait training;Stair training;DME Instruction;Functional mobility training;Therapeutic activities;Therapeutic exercise;Balance training;Neuromuscular re-education;Patient/family education;Manual techniques;Passive range of motion;Dry needling;Energy conservation;Vestibular;Joint Manipulations    PT Next Visit Plan Wants to practice again with alternating foot taps on soccer ball; hip extension exercises/strethces, POC as preivously indicated    PT Home Exercise Plan added quadruped knee flexion stretch and half stretch off a step today    Consulted and Agree with Plan of Care Patient             Patient will benefit from skilled therapeutic intervention in order to improve the following deficits and impairments:  Abnormal gait, Decreased balance, Decreased range of motion, Decreased strength, Hypomobility, Impaired sensation, Pain, Improper body mechanics, Impaired flexibility, Decreased mobility  Visit Diagnosis: Unsteadiness on feet  Other abnormalities of gait and mobility  Muscle weakness (generalized)     Problem List There are no problems to display for this patient.  HRicard DillonPT, DPT 08/23/2020, 6:16 PM  CD[place]MAIN RInsight Surgery And Laser [facility] LLCSERVICES [address] [address]Phone: [phone]  Fax:  [phone] Name: Nicholas MOORMANMRN: [id number] of Birth: 103-15-1947

## 2020-08-27 ENCOUNTER — Encounter: Payer: Self-pay | Admitting: Physical Therapy

## 2020-08-27 ENCOUNTER — Ambulatory Visit: Payer: Medicare Other | Admitting: Physical Therapy

## 2020-08-27 ENCOUNTER — Other Ambulatory Visit: Payer: Self-pay

## 2020-08-27 ENCOUNTER — Ambulatory Visit: Payer: Medicare Other

## 2020-08-27 DIAGNOSIS — M6281 Muscle weakness (generalized): Secondary | ICD-10-CM

## 2020-08-27 DIAGNOSIS — G8929 Other chronic pain: Secondary | ICD-10-CM

## 2020-08-27 DIAGNOSIS — R2681 Unsteadiness on feet: Secondary | ICD-10-CM

## 2020-08-27 DIAGNOSIS — R2689 Other abnormalities of gait and mobility: Secondary | ICD-10-CM

## 2020-08-28 NOTE — Therapy (Signed)
[place] MAIN [facility] SERVICES [address] [place], [name], [address] Phone: [phone]   Fax:  [phone]  Physical Therapy Treatment Physical Therapy Progress Note   Dates of reporting period  07-16-20   to   08-27-20   Patient Details  Name: Nicholas [name] MRN: [id number] Date of Birth: 13-Oct-1945 Referring Provider (PT): Rusty Aus, MD   Encounter Date: 08/27/2020   PT End of Session - 08/27/20 1109     Visit Number 30    Number of Visits 40    Date for PT Re-Evaluation 09/25/20    Authorization Type [employer] Medicare, BCBS supplemental    Authorization Time Period 5/36/46- 10/11/19; recert on 05/03/80    PT Start Time 1102    PT Stop Time 1145    PT Time Calculation (min) 43 min    Equipment Utilized During Treatment Gait belt    Activity Tolerance Patient tolerated treatment well    Behavior During Therapy [facility] for tasks assessed/performed             Past Medical History:  Diagnosis Date   A-fib ([place])    Cancer ([place]) 10/25/2019   Prostate   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   Subjective Assessment - 08/27/20 1107     Subjective Pt reports no pain. He states his back pain has continued to improve since starting therapy; He does report feeling more unsteady lately and thinks that his unsteadiness is related to poor sleep; He reports doing HEP 3x in the last week;    Pertinent History Pt is a pleasant 75 y.o. male referred to PT for LBP which occured early February.  Pt has a PMH of prostate cx, TIA, chronic a-fib, and recent hematuria. Pt has pain with t/f from STS but feels better with walking. Pt states his LBP has been improving but his knees and hips have neem giving him more pain. Pt's LBP is a strong ache that is on the R side but denies radicular symptoms. Pt reports exercises given by MD have been beneficial and improving his LBP symptoms. Currently in no pain during subjective. when LBP is flared up he reports 4-5/10 NPS. Worst pain is 5-6/10 NPS but pt is unable to explain what causes worst pain but reports possibly most pain due sitting for long periods of time. Prednisone has improved LBP. Pt reports his goals in therapy are to improve balance although he did not mention to Dr. Sabra [name] when referred to PT for his LBP. Also wants to improve his strength. He reports he requires objects to help support him when walking such as walls or furniture. Pt denies falls, but  spouse reports a fall last fall at home where he tripped over a blanket. Has reported near falls where he needed touching of an object to correct. Pt uses a stick now and then for balance but does report difficulty transferring from sturdy to unstardy surfaces (i.e. stepping over a stream) and  walking stick helps.    Currently in Pain? No/denies              TREATMENT: PT instructed patient in outcome measures to address progress towards goals:  5 times sit<>Stand, with arms across chest: 13.5 sec  (<15 sec indicates low risk for falls)  PT instructed patient in prone position: Assessed LE hip extension AROM: R: 7 degrees L: 8 degrees Required min Vcs to avoid trunk rotation to isolate true hip extension  Patient does report slight increase in back pain; PT instructed patient in qped position: Cat/camel stretch 5 sec hold x5 reps with mod VCs for proper positioning;   Instructed patient in DGI and FOTO survey,  see below:  [facility] PT Assessment - 08/28/20 0001        Observation/Other Assessments   Focus on Therapeutic Outcomes (FOTO)  63%      Dynamic Gait Index   Level Surface Normal    Change in Gait Speed Normal    Gait with Horizontal Head Turns Mild Impairment    Gait with Vertical Head Turns Mild Impairment    Gait and Pivot Turn Normal    Step Over Obstacle Mild Impairment    Step Around Obstacles Normal    Steps Mild Impairment    Total Score 20    DGI comment: slight improvement from 19/24 on 5/922              Patient tolerated session well. He reports feeling better in his back with minimal back discomfort. He does report occasional unsteadiness but overall doesn't feel like a risk for falls  He has made progress towards all his goals and is close to meeting most of them. Recommend therapy continue for additional 4 weeks for patient to return to PLOF.                      PT Education - 08/27/20 1109     Education Details Plan of care/progress towards goals;    Person(s) Educated Patient    Methods Explanation;Verbal cues    Comprehension Verbalized understanding;Returned demonstration;Verbal cues required;Need further instruction              PT Short Term Goals - 08/27/20 1110       PT SHORT TERM GOAL #1   Title Patient will be independent in home exercise program to improve strength/mobility for better functional independence with ADLs.    Baseline 3/3: established HEP today. See medbridge access code; 06/07/2020 pt reports doing some of his HEP, but that he has difficulty with some of the supine exercises; 4/28: pt not yet fully indep. with HEP; 5/10: pt inconsistently performing HEP; 6/20: doing HEP 3x a week;    Time 4    Period Weeks    Status Achieved    Target Date 08/02/20               PT Long Term Goals - 08/27/20 1110       PT LONG TERM GOAL #1   Title Patient will increase FOTO score equal to or greater than 69 to demonstrate statistically significant improvement in mobility  and quality of life.    Baseline 3/3: 55; 3/31 59%; 4/28: 60%; 5/9: 56%, 6/20: 63%    Time 4    Period Weeks    Status On-going    Target Date 09/24/20      PT LONG TERM GOAL #2   Title Patient (> 27 years old)  will complete five times sit to stand test in < 12 seconds indicating an increased LE strength and improved balance.    Baseline 3/3: 15.38 m/s; 06/07/2020:  11 sec, 6/20: 13.5 sec with arms across chest;    Time 4    Period Weeks    Status Partially Met      PT LONG TERM GOAL #3   Title Pt will improve hip extension AROM > 10 deg bilaterally to normalize reciprocal gait and balance.    Baseline 3/3: R/L 4 deg/3 deg; 3/31 R/L 5 deg/3 deg; 4/28: 5 deg R, 0 deg. L; 4/9: 4-5 deg. ext B, 6/20: 8 degrees bilaterally;    Time 4    Period Weeks    Status On-going    Target Date 09/24/20      PT LONG TERM GOAL #4   Title Pt will improve Berg score > 46 to display decreased risk of falls.    Baseline 3/3: Will assess upon MD approval; 06/07/2020 52/56; 4/28: 55/56    Time 8    Period Weeks    Status Achieved      PT LONG TERM GOAL #5   Title The patient will be able to maintain at least 10 seconds of SLB on BLEs in order to decrease risk of falls when navigating obstacles, curbs and steps.    Baseline 06/07/2020: pt unable to maintain more than 3 sec of SLB without UE support; 4/28: 15 sec BLEs    Time 4    Period Weeks    Status Achieved      PT LONG TERM GOAL #6   Title Patient will increase dynamic gait index score to >20/24 as to demonstrate reduced fall risk and improved dynamic gait balance for better safety with community/home ambulation.    Baseline 4/28: 17/24; 5/9: 19/24, 6/20: 20/24    Time 4    Period Weeks    Status Partially Met    Target Date 09/24/20                   Plan - 08/28/20 0930     Clinical Impression Statement Patient motivated and participated well within session. He is progressing with improved hip ROM as evidenced with improved hip  extension. He also exhibits improved dynamic balance as evidenced with improved score on the DGI. Patient also reports significant reduction in low back pain with most ADLs. Despite improvements, he continues to be considered at an increased risk for falls and does report general decreased mobility with ADLs. He would benefit from additional skilled PT intervention to achieve goals and return to PLOF.    Personal Factors and Comorbidities Age;Comorbidity 3+;Past/Current Experience    Comorbidities A-fib, lumbar disc disease, prostate cancer, and  history of TIA.    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    Rehab Potential Good    PT Frequency 2x / week    PT Duration 4 weeks    PT Treatment/Interventions ADLs/Self Care Home Management;Biofeedback;Aquatic Therapy;Canalith Repostioning;Cryotherapy;Electrical Stimulation;Iontophoresis 81m/ml Dexamethasone;Moist Heat;Traction;Ultrasound;Gait training;Stair training;DME Instruction;Functional mobility training;Therapeutic activities;Therapeutic exercise;Balance training;Neuromuscular re-education;Patient/family education;Manual techniques;Passive range of motion;Dry needling;Energy conservation;Vestibular;Joint Manipulations    PT Next Visit Plan Wants to practice again with alternating foot taps on soccer ball; hip extension exercises/strethces, POC as preivously indicated    PT Home Exercise Plan added quadruped knee flexion stretch and half stretch off a step today    Consulted  and Agree with Plan of Care Patient             Patient will benefit from skilled therapeutic intervention in order to improve the following deficits and impairments:  Abnormal gait, Decreased balance, Decreased range of motion, Decreased strength, Hypomobility, Impaired sensation, Pain,  Improper body mechanics, Impaired flexibility, Decreased mobility  Visit Diagnosis: Unsteadiness on feet  Other abnormalities of gait and mobility  Muscle weakness (generalized)  Chronic bilateral low back pain, unspecified whether sciatica present     Problem List There are no problems to display for this patient.   Taye Cato PT, DPT 08/28/2020, 9:38 AM  [place] MAIN [facility] SERVICES [address] [place], [name], [address] Phone: [phone]   Fax:  [phone]  Name: Nicholas [name] MRN: [id number] Date of Birth: 05/19/1945

## 2020-08-30 ENCOUNTER — Ambulatory Visit: Payer: Medicare Other

## 2020-08-30 ENCOUNTER — Other Ambulatory Visit: Payer: Self-pay

## 2020-08-30 DIAGNOSIS — M6281 Muscle weakness (generalized): Secondary | ICD-10-CM

## 2020-08-30 DIAGNOSIS — R2689 Other abnormalities of gait and mobility: Secondary | ICD-10-CM

## 2020-08-30 DIAGNOSIS — R2681 Unsteadiness on feet: Secondary | ICD-10-CM

## 2020-08-30 NOTE — Therapy (Signed)
[place] MAIN [facility] SERVICES [address] [name], [name], [address] Phone: [phone]   Fax:  [phone]  Physical Therapy Treatment  Patient Details  Name: Nicholas [name] MRN: [id number] Date of Birth: March 31, 1945 Referring Provider (PT): Rusty Aus, MD   Encounter Date: 08/30/2020   PT End of Session - 08/30/20 1640     Visit Number 31    Number of Visits 40    Date for PT Re-Evaluation 09/25/20    Authorization Type [employer] Medicare, BCBS supplemental    Authorization Time Period 2/42/35- 3/61/44; recert on 05/23/38    PT Start Time 1139    PT Stop Time 1224    PT Time Calculation (min) 45 min    Equipment Utilized During Treatment Gait belt    Activity Tolerance Patient tolerated treatment well    Behavior During Therapy [facility] for tasks assessed/performed             Past Medical History:  Diagnosis Date   A-fib ([place])    Cancer ([place]) 10/25/2019   Prostate   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   Subjective Assessment - 08/30/20 1639     Subjective Pt reports he has been doing HEP. Pt reports he is a little sore today.    Pertinent History Pt is a pleasant 75 y.o. male referred to PT for LBP which occured early February. Pt has a PMH of prostate cx, TIA, chronic a-fib, and recent hematuria. Pt has pain with t/f from STS but feels better with walking. Pt states his LBP has been improving but his knees and hips have neem giving him more pain. Pt's LBP is a strong ache that  is on the R side but denies radicular symptoms. Pt reports exercises given by MD have been beneficial and improving his LBP symptoms. Currently in no pain during subjective. when LBP is flared up he reports 4-5/10 NPS. Worst pain is 5-6/10 NPS but pt is unable to explain what causes worst pain but reports possibly most pain due sitting for long periods of time. Prednisone has improved LBP. Pt reports his goals in therapy are to improve balance although he did not mention to Dr. Sabra [name] when referred to PT for his LBP. Also wants to improve his strength. He reports he requires objects to help support him when walking such as walls or furniture. Pt denies falls, but  spouse reports a fall last fall at home where he tripped over a blanket. Has reported near falls where he needed touching of an object to correct. Pt uses a stick now and then for balance but does report difficulty transferring from sturdy to unstardy surfaces (i.e. stepping over a stream) and walking stick helps.    Currently in Pain? No/denies   reports soreness, but not pain           TREATMENT -   Neuro Re-Ed - CGA- min assist provided throughout    STS with airex pad  under BLEs - 5x. Pt fatigues quickly, requires frequent UE support.   // bars Tandem stance 2x30-45 sec each LE; improved postural stability  Standing on airex and other foot on 4" step (progression for SLB) - 4x45 sec each LE; increasingly steady with reps, and shows improved stability compared to previous session. Requires less frequent UE support.   SLB progression - one foot on floor one on soccer ball - forward/backward, CW/CC - 4x30 sec each LE; intermittent UE support  Alternating toe and heel taps onto 6" step - 2x18 for each and for each LE. No LOB.   Stepping forward/backward over half-foam x multiple reps. No LOB, but at times does heel comes into contact with half-foam/does not fully clear it.  Stepping over orange hurdle forward/back - 2x15. Improved  today, not taking as many small steps/improved postural stability.   On airex: NBOS - 2x60 sec NBOS with horizontal head turns - 10x each direction NBOS with vertical head turns - 10x  WBOS EC - 2x60 sec NBOS EC -2x60 sec  Significant improvement with all balance tasks. Requires less frequent UE support while on airex. Shows improved postural stability.  Pt educated throughout in the form of demo/VC/TC to facilitate improved movement at target joints and correct muscle activation with exercises. The pt exhibits good carryover within session after cuing.       PT Education - 08/30/20 1640     Education Details exercise technique, body mechanics    Person(s) Educated Patient    Methods Explanation;Demonstration;Verbal cues    Comprehension Verbalized understanding;Returned demonstration              PT Short Term Goals - 08/27/20 1110       PT SHORT TERM GOAL #1   Title Patient will be independent in home exercise program to improve strength/mobility for better functional independence with ADLs.    Baseline 3/3: established HEP today. See medbridge access code; 06/07/2020 pt reports doing some of his HEP, but that he has difficulty with some of the supine exercises; 4/28: pt not yet fully indep. with HEP; 5/10: pt inconsistently performing HEP; 6/20: doing HEP 3x a week;    Time 4    Period Weeks    Status Achieved    Target Date 08/02/20               PT Long Term Goals - 08/27/20 1110       PT LONG TERM GOAL #1   Title Patient will increase FOTO score equal to or greater than 69 to demonstrate statistically significant improvement in mobility and quality of life.    Baseline 3/3: 55; 3/31 59%; 4/28: 60%; 5/9: 56%, 6/20: 63%    Time 4    Period Weeks    Status On-going    Target Date 09/24/20      PT LONG TERM GOAL #2   Title Patient (> 74 years old) will complete five times sit to stand test in < 12 seconds indicating an increased LE strength and improved  balance.    Baseline 3/3: 15.38 m/s; 06/07/2020:  11 sec, 6/20: 13.5 sec with arms across chest;    Time 4    Period Weeks    Status Partially Met      PT LONG TERM GOAL #3   Title Pt will improve hip extension AROM > 10 deg bilaterally to normalize reciprocal gait and balance.    Baseline 3/3: R/L 4 deg/3 deg; 3/31 R/L 5 deg/3 deg;  4/28: 5 deg R, 0 deg. L; 4/9: 4-5 deg. ext B, 6/20: 8 degrees bilaterally;    Time 4    Period Weeks    Status On-going    Target Date 09/24/20      PT LONG TERM GOAL #4   Title Pt will improve Berg score > 46 to display decreased risk of falls.    Baseline 3/3: Will assess upon MD approval; 06/07/2020 52/56; 4/28: 55/56    Time 8    Period Weeks    Status Achieved      PT LONG TERM GOAL #5   Title The patient will be able to maintain at least 10 seconds of SLB on BLEs in order to decrease risk of falls when navigating obstacles, curbs and steps.    Baseline 06/07/2020: pt unable to maintain more than 3 sec of SLB without UE support; 4/28: 15 sec BLEs    Time 4    Period Weeks    Status Achieved      PT LONG TERM GOAL #6   Title Patient will increase dynamic gait index score to >20/24 as to demonstrate reduced fall risk and improved dynamic gait balance for better safety with community/home ambulation.    Baseline 4/28: 17/24; 5/9: 19/24, 6/20: 20/24    Time 4    Period Weeks    Status Partially Met    Target Date 09/24/20                   Plan - 08/30/20 1641     Clinical Impression Statement Pt continues to be highly motivated in PT. He exhibits improvement today with majority of static balance exercises, requiring less frequent UE support, indicating carryover between sessions. He was most challenged with STS from compliant surface. The pt will benefit from further skilled PT to improve balance, strength in order to increase safety with all functional mobility.    Personal Factors and Comorbidities Age;Comorbidity 3+;Past/Current  Experience    Comorbidities A-fib, lumbar disc disease, prostate cancer, and  history of TIA.    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    Rehab Potential Good    PT Frequency 2x / week    PT Duration 4 weeks    PT Treatment/Interventions ADLs/Self Care Home Management;Biofeedback;Aquatic Therapy;Canalith Repostioning;Cryotherapy;Electrical Stimulation;Iontophoresis 70m/ml Dexamethasone;Moist Heat;Traction;Ultrasound;Gait training;Stair training;DME Instruction;Functional mobility training;Therapeutic activities;Therapeutic exercise;Balance training;Neuromuscular re-education;Patient/family education;Manual techniques;Passive range of motion;Dry needling;Energy conservation;Vestibular;Joint Manipulations    PT Next Visit Plan Wants to practice again with alternating foot taps on soccer ball; hip extension exercises/strethces, POC as preivously indicated    PT Home Exercise Plan added quadruped knee flexion stretch and half stretch off a step today    Consulted and Agree with Plan of Care Patient             Patient will benefit from skilled therapeutic intervention in order to improve the following deficits and impairments:  Abnormal gait, Decreased balance, Decreased range of motion, Decreased strength, Hypomobility, Impaired sensation, Pain, Improper body mechanics, Impaired flexibility, Decreased mobility  Visit Diagnosis: Unsteadiness on feet  Other abnormalities of gait and mobility  Muscle weakness (generalized)     Problem List There are no problems to display for this patient.  HRicard DillonPT, DPT 08/30/2020, 4:49 PM  [place] [facility] [address] [address]Phone: [phone]  Fax:  [phone]  Name: Nicholas [name] MRN: [id number] Date of Birth: Feb 10, 1946

## 2020-09-03 ENCOUNTER — Other Ambulatory Visit: Payer: Self-pay

## 2020-09-03 ENCOUNTER — Ambulatory Visit: Payer: Medicare Other

## 2020-09-03 DIAGNOSIS — R2681 Unsteadiness on feet: Secondary | ICD-10-CM

## 2020-09-03 DIAGNOSIS — M6281 Muscle weakness (generalized): Secondary | ICD-10-CM | POA: Diagnosis not present

## 2020-09-03 DIAGNOSIS — R2689 Other abnormalities of gait and mobility: Secondary | ICD-10-CM

## 2020-09-03 NOTE — Therapy (Signed)
[place] MAIN [facility] SERVICES [address] [place], [name], [address] Phone: [phone]   Fax:  [phone]  Physical Therapy Treatment  Patient Details  Name: Nicholas [name] MRN: [id number] Date of Birth: [birth year] Referring Provider (PT): Rusty Aus, MD   Encounter Date: 09/03/2020   PT End of Session - 09/04/20 0834     Visit Number 32    Number of Visits 40    Date for PT Re-Evaluation 09/25/20    Authorization Type [employer] Medicare, BCBS supplemental    Authorization Time Period 1/49/70- 2/63/78; recert on 5/88/50    PT Start Time 1148    PT Stop Time 1230    PT Time Calculation (min) 42 min    Equipment Utilized During Treatment Gait belt    Activity Tolerance Patient tolerated treatment well    Behavior During Therapy [facility] for tasks assessed/performed             Past Medical History:  Diagnosis Date   A-fib ([place])    Cancer ([place]) 10/25/2019   Prostate   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   Subjective Assessment - 09/04/20 0833     Subjective Pt reports he is sore today. He reports no instances of LOB.    Pertinent History Pt is a pleasant 75 y.o. male referred to PT for LBP which occured early February. Pt has a PMH of prostate cx, TIA, chronic a-fib, and recent hematuria. Pt has pain with t/f from STS but feels better with walking. Pt states his LBP has been improving but his knees and hips have neem giving him more pain. Pt's LBP is a strong ache that is on the R  side but denies radicular symptoms. Pt reports exercises given by MD have been beneficial and improving his LBP symptoms. Currently in no pain during subjective. when LBP is flared up he reports 4-5/10 NPS. Worst pain is 5-6/10 NPS but pt is unable to explain what causes worst pain but reports possibly most pain due sitting for long periods of time. Prednisone has improved LBP. Pt reports his goals in therapy are to improve balance although he did not mention to Dr. Sabra [name] when referred to PT for his LBP. Also wants to improve his strength. He reports he requires objects to help support him when walking such as walls or furniture. Pt denies falls, but  spouse reports a fall last fall at home where he tripped over a blanket. Has reported near falls where he needed touching of an object to correct. Pt uses a stick now and then for balance but does report difficulty transferring from sturdy to unstardy surfaces (i.e. stepping over a stream) and walking stick helps.    Currently in Pain? No/denies   no pain but soreness                  TREATMENT -   Neuro Re-Ed - CGA- min assist provided throughout   At support  surface- Standing on half-bosu - 2x60 each LE; intermittent UE support Forward lunge on bosu - 2x10 reps each LE, greater stability on LLE than RLE Side-lunge on the half-bosu - 2x10 each LE; pt with greater stability on LLE as stance leg compared to RLE. With RLE as stance leg pt has tendency to lose balance backward, utilizing UE support to regain balance.  Ankle Rockers 10x each direction (PF/DF) Tandem stance - 4x30 sec each LE - improved NBOS on airex - 2x30 sec intermittent UE support NBOS on airex wit horziontal head turns - 16x each direction, intermittent UE support STS with airex pad under BLEs - 3x5. Pt fatigues quickly, requires frequent UE support. Requires close CGA-min a, UE support to maintain balance. High knee marches - 16x alternating LEs Cone taps forward/and  contralateral - 10x each LE; pt rates medium; requires intermittent UE support  Standing calf stretch - 60 sec each LE  Pt educated throughout in the form of demo/VC/TC to facilitate improved movement at target joints and correct muscle activation with exercises. The pt exhibits good carryover within session after cuing.      PT Education - 09/04/20 0834     Education Details exercise technique, body mechanics    Person(s) Educated Patient    Methods Explanation;Demonstration;Verbal cues    Comprehension Verbalized understanding;Returned demonstration              PT Short Term Goals - 08/27/20 1110       PT SHORT TERM GOAL #1   Title Patient will be independent in home exercise program to improve strength/mobility for better functional independence with ADLs.    Baseline 3/3: established HEP today. See medbridge access code; 06/07/2020 pt reports doing some of his HEP, but that he has difficulty with some of the supine exercises; 4/28: pt not yet fully indep. with HEP; 5/10: pt inconsistently performing HEP; 6/20: doing HEP 3x a week;    Time 4    Period Weeks    Status Achieved    Target Date 08/02/20               PT Long Term Goals - 08/27/20 1110       PT LONG TERM GOAL #1   Title Patient will increase FOTO score equal to or greater than 69 to demonstrate statistically significant improvement in mobility and quality of life.    Baseline 3/3: 55; 3/31 59%; 4/28: 60%; 5/9: 56%, 6/20: 63%    Time 4    Period Weeks    Status On-going    Target Date 09/24/20      PT LONG TERM GOAL #2   Title Patient (> 54 years old) will complete five times sit to stand test in < 12 seconds indicating an increased LE strength and improved balance.    Baseline 3/3: 15.38 m/s; 06/07/2020:  11 sec, 6/20: 13.5 sec with arms across chest;    Time 4    Period Weeks    Status Partially Met      PT LONG TERM GOAL #3   Title Pt will improve hip extension AROM > 10 deg bilaterally to  normalize reciprocal gait and balance.    Baseline 3/3: R/L 4 deg/3 deg; 3/31 R/L 5 deg/3 deg; 4/28: 5 deg R, 0 deg. L; 4/9: 4-5 deg. ext B, 6/20: 8 degrees bilaterally;    Time 4    Period Weeks    Status On-going    Target Date 09/24/20  PT LONG TERM GOAL #4   Title Pt will improve Berg score > 46 to display decreased risk of falls.    Baseline 3/3: Will assess upon MD approval; 06/07/2020 52/56; 4/28: 55/56    Time 8    Period Weeks    Status Achieved      PT LONG TERM GOAL #5   Title The patient will be able to maintain at least 10 seconds of SLB on BLEs in order to decrease risk of falls when navigating obstacles, curbs and steps.    Baseline 06/07/2020: pt unable to maintain more than 3 sec of SLB without UE support; 4/28: 15 sec BLEs    Time 4    Period Weeks    Status Achieved      PT LONG TERM GOAL #6   Title Patient will increase dynamic gait index score to >20/24 as to demonstrate reduced fall risk and improved dynamic gait balance for better safety with community/home ambulation.    Baseline 4/28: 17/24; 5/9: 19/24, 6/20: 20/24    Time 4    Period Weeks    Status Partially Met    Target Date 09/24/20                   Plan - 09/04/20 4008     Clinical Impression Statement Pt continues to exhibit improvment with balance tasks that utilize equal BLE stance. However, pt is still challenged with balance when RLE is primary stance leg. Pt has tendency to lose balance posteriorly requiring UE support to regain balance. CGA-min assist was provided throughout session. The pt will benefit from further skilled PT to improve balance and LE strength to decrease fall risk.    Personal Factors and Comorbidities Age;Comorbidity 3+;Past/Current Experience    Comorbidities A-fib, lumbar disc disease, prostate cancer, and  history of TIA.    Examination-Activity Limitations Reach Overhead;Stand;Bend;Lift;Locomotion Level;Squat    Examination-Participation Restrictions  Community Activity;Yard Work    Merchant [employer] officer Evolving/Moderate complexity    Rehab Potential Good    PT Frequency 2x / week    PT Duration 4 weeks    PT Treatment/Interventions ADLs/Self Care Home Management;Biofeedback;Aquatic Therapy;Canalith Repostioning;Cryotherapy;Electrical Stimulation;Iontophoresis 37m/ml Dexamethasone;Moist Heat;Traction;Ultrasound;Gait training;Stair training;DME Instruction;Functional mobility training;Therapeutic activities;Therapeutic exercise;Balance training;Neuromuscular re-education;Patient/family education;Manual techniques;Passive range of motion;Dry needling;Energy conservation;Vestibular;Joint Manipulations    PT Next Visit Plan Wants to practice again with alternating foot taps on soccer ball; hip extension exercises/strethces, POC as preivously indicated    PT Home Exercise Plan added quadruped knee flexion stretch and half stretch off a step today, no updates on this date    Consulted and Agree with Plan of Care Patient             Patient will benefit from skilled therapeutic intervention in order to improve the following deficits and impairments:  Abnormal gait, Decreased balance, Decreased range of motion, Decreased strength, Hypomobility, Impaired sensation, Pain, Improper body mechanics, Impaired flexibility, Decreased mobility  Visit Diagnosis: Unsteadiness on feet  Other abnormalities of gait and mobility  Muscle weakness (generalized)     Problem List There are no problems to display for this patient.  HRicard DillonPT, DPT 09/04/2020, 8:41 AM  [facility]SERV[facility] [address] [address]Phone: [phone]  Fax:  [phone] Name: Nicholas FULLILOVEMRN: [id number] of Birth: 108/18/1947

## 2020-09-05 ENCOUNTER — Other Ambulatory Visit: Payer: Self-pay

## 2020-09-05 ENCOUNTER — Ambulatory Visit (INDEPENDENT_AMBULATORY_CARE_PROVIDER_SITE_OTHER): Payer: Medicare Other | Admitting: Dermatology

## 2020-09-05 DIAGNOSIS — L821 Other seborrheic keratosis: Secondary | ICD-10-CM

## 2020-09-05 DIAGNOSIS — L814 Other melanin hyperpigmentation: Secondary | ICD-10-CM

## 2020-09-05 DIAGNOSIS — Z1283 Encounter for screening for malignant neoplasm of skin: Secondary | ICD-10-CM

## 2020-09-05 DIAGNOSIS — L578 Other skin changes due to chronic exposure to nonionizing radiation: Secondary | ICD-10-CM | POA: Diagnosis not present

## 2020-09-05 DIAGNOSIS — L570 Actinic keratosis: Secondary | ICD-10-CM

## 2020-09-05 DIAGNOSIS — D1800 Hemangioma unspecified site: Secondary | ICD-10-CM

## 2020-09-05 DIAGNOSIS — L219 Seborrheic dermatitis, unspecified: Secondary | ICD-10-CM

## 2020-09-05 DIAGNOSIS — L820 Inflamed seborrheic keratosis: Secondary | ICD-10-CM

## 2020-09-05 DIAGNOSIS — L719 Rosacea, unspecified: Secondary | ICD-10-CM

## 2020-09-05 DIAGNOSIS — D229 Melanocytic nevi, unspecified: Secondary | ICD-10-CM

## 2020-09-05 MED ORDER — KETOCONAZOLE 2 % EX CREA: 1.0000 "application " | TOPICAL_CREAM | Freq: Every day | CUTANEOUS | 11 refills | Status: DC

## 2020-09-05 MED ORDER — HYDROCORTISONE 2.5 % EX CREA: TOPICAL_CREAM | Freq: Every day | CUTANEOUS | 11 refills | Status: DC

## 2020-09-05 NOTE — Patient Instructions (Signed)

## 2020-09-05 NOTE — Progress Notes (Signed)
Follow-Up Visit   Subjective  Nicholas [name] is a 75 y.o. male who presents for the following: Annual Exam (History of AK - TBSE today). The patient presents for Total-Body Skin Exam (TBSE) for skin cancer screening and mole check.  Accompanied by wife who contributes to history  The following portions of the chart were reviewed this encounter and updated as appropriate:   Tobacco  Allergies  Meds  Problems  Med Hx  Surg Hx  Fam Hx      Review of Systems:  No other skin or systemic complaints except as noted in HPI or Assessment and Plan.  Objective  Well appearing patient in no apparent distress; mood and affect are within normal limits.  A full examination was performed including scalp, head, eyes, ears, nose, lips, neck, chest, axillae, abdomen, back, buttocks, bilateral upper extremities, bilateral lower extremities, hands, feet, fingers, toes, fingernails, and toenails. All findings within normal limits unless otherwise noted below.  Face Pinkness and scale  Face/neck/shoulders x 19, left dorsum hand x 5, back x 1 (25) Erythematous keratotic or waxy stuck-on papule or plaque.   Face Pinkness  Face (4) Erythematous thin papules/macules with gritty scale.    Assessment & Plan   Lentigines - Scattered tan macules - Due to sun exposure - Benign-appering, observe - Recommend daily broad spectrum sunscreen SPF 30+ to sun-exposed areas, reapply every 2 hours as needed. - Call for any changes  Seborrheic Keratoses - Stuck-on, waxy, tan-brown papules and/or plaques  - Benign-appearing - Discussed benign etiology and prognosis. - Observe - Call for any changes  Melanocytic Nevi - Tan-brown and/or pink-flesh-colored symmetric macules and papules - Benign appearing on exam today - Observation - Call clinic for new or changing moles - Recommend daily use of broad spectrum spf 30+ sunscreen to sun-exposed areas.   Hemangiomas - Red papules - Discussed  benign nature - Observe - Call for any changes  Actinic Damage - Chronic condition, secondary to cumulative UV/sun exposure - diffuse scaly erythematous macules with underlying dyspigmentation - Recommend daily broad spectrum sunscreen SPF 30+ to sun-exposed areas, reapply every 2 hours as needed.  - Staying in the shade or wearing long sleeves, sun glasses (UVA+UVB protection) and wide brim hats (4-inch brim around the entire circumference of the hat) are also recommended for sun protection.  - Call for new or changing lesions.  Skin cancer screening performed today.  Seborrheic dermatitis Face  Seborrheic Dermatitis  -  is a chronic persistent rash characterized by pinkness and scaling most commonly of the mid face but also can occur on the scalp (dandruff), ears; mid chest and mid back. It tends to be exacerbated by stress and cooler weather.  People who have neurologic disease may experience new onset or exacerbation of existing seborrheic dermatitis.  The condition is not curable but treatable and can be controlled.  Start Ketonconazole 2% cream qd Monday, Wednesday, Friday and Hydrocortisone 2.5% cream qd Tuesday, Thursday, Saturday  ketoconazole (NIZORAL) 2 % cream - Face Apply 1 application topically daily. Monday, Wednesday, Friday  hydrocortisone 2.5 % cream - Face Apply topically daily. Tuesday, Thursday, Saturday  Inflamed seborrheic keratosis Face/neck/shoulders x 19, left dorsum hand x 5, back x 1  Destruction of lesion - Face/neck/shoulders x 19, left dorsum hand x 5, back x 1 Complexity: simple   Destruction method: cryotherapy   Informed consent: discussed and consent obtained   Timeout:  patient name, date of birth, surgical site, and procedure verified Lesion  destroyed using liquid nitrogen: Yes   Region frozen until ice ball extended beyond lesion: Yes   Outcome: patient tolerated procedure well with no complications   Post-procedure details: wound care  instructions given    Rosacea Face Discussed BBL/laser treatments Rosacea is a chronic progressive skin condition usually affecting the face of adults, causing redness and/or acne bumps. It is treatable but not curable. It sometimes affects the eyes (ocular rosacea) as well. It may respond to topical and/or systemic medication and can flare with stress, sun exposure, alcohol, exercise and some foods.  Daily application of broad spectrum spf 30+ sunscreen to face is recommended to reduce flares.  AK (actinic keratosis) (4) Face  Destruction of lesion - Face Complexity: simple   Destruction method: cryotherapy   Informed consent: discussed and consent obtained   Timeout:  patient name, date of birth, surgical site, and procedure verified Lesion destroyed using liquid nitrogen: Yes   Region frozen until ice ball extended beyond lesion: Yes   Outcome: patient tolerated procedure well with no complications   Post-procedure details: wound care instructions given    Return in about 2 months (around 11/05/2020) for ISK follow up and treat ISK of arms, Seb derm follow up.  I, Ashok Cordia, CMA, am acting as scribe for Sarina Ser, MD .  Documentation: I have reviewed the above documentation for accuracy and completeness, and I agree with the above.  Sarina Ser, MD

## 2020-09-06 ENCOUNTER — Ambulatory Visit: Payer: Medicare Other

## 2020-09-06 DIAGNOSIS — M6281 Muscle weakness (generalized): Secondary | ICD-10-CM | POA: Diagnosis not present

## 2020-09-06 DIAGNOSIS — R2689 Other abnormalities of gait and mobility: Secondary | ICD-10-CM

## 2020-09-06 DIAGNOSIS — R2681 Unsteadiness on feet: Secondary | ICD-10-CM

## 2020-09-06 NOTE — Therapy (Signed)
[place] MAIN [facility] SERVICES [address] [place], [name], [address] Phone: [phone]   Fax:  [phone]  Physical Therapy Treatment  Patient Details  Name: Nicholas [name] MRN: [id number] Date of Birth: [birth year] Referring Provider (PT): Rusty Aus, MD   Encounter Date: 09/06/2020   PT End of Session - 09/06/20 1521     Visit Number 33    Number of Visits 40    Date for PT Re-Evaluation 09/25/20    Authorization Type [employer] Medicare, BCBS supplemental    Authorization Time Period 3/88/82- 8/00/34; recert on 11/24/89    PT Start Time 1147    PT Stop Time 1229    PT Time Calculation (min) 42 min    Equipment Utilized During Treatment Gait belt    Activity Tolerance Patient tolerated treatment well    Behavior During Therapy [facility] for tasks assessed/performed             Past Medical History:  Diagnosis Date   A-fib ([place])    Cancer ([place]) 10/25/2019   Prostate   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   Subjective Assessment - 09/06/20 1519     Subjective No changes reported since previous session, no LOB, no pain.    Pertinent History Pt is a pleasant 75 y.o. male referred to PT for LBP which occured early February. Pt has a PMH of prostate cx, TIA, chronic a-fib, and recent hematuria. Pt has pain with t/f from STS but feels better with walking. Pt states his LBP has been improving but his knees and hips have neem giving him more pain. Pt's LBP is a strong ache that is on the R  side but denies radicular symptoms. Pt reports exercises given by MD have been beneficial and improving his LBP symptoms. Currently in no pain during subjective. when LBP is flared up he reports 4-5/10 NPS. Worst pain is 5-6/10 NPS but pt is unable to explain what causes worst pain but reports possibly most pain due sitting for long periods of time. Prednisone has improved LBP. Pt reports his goals in therapy are to improve balance although he did not mention to Dr. Sabra [name] when referred to PT for his LBP. Also wants to improve his strength. He reports he requires objects to help support him when walking such as walls or furniture. Pt denies falls, but  spouse reports a fall last fall at home where he tripped over a blanket. Has reported near falls where he needed touching of an object to correct. Pt uses a stick now and then for balance but does report difficulty transferring from sturdy to unstardy surfaces (i.e. stepping over a stream) and walking stick helps.    Currently in Pain? No/denies            TREATMENT  Therex-  STS - 2x10, 1x6; pt rates easy-medium. Improved consistency with technique. Pt with a few instances of taking small steps to regain balance. CGA provided.  Standing heel raises - 2x20 BLEs; pt with increased AROM of RLE compared to LLE  Static lunges - 2x8 BLEs; pt rates exercise as difficult  Seated DF - 2x20   Standing calf stretch - 60 sec each LE  Instructed pt in safe use of ice at home for 15-20 min over B knees. Pt verbalizes understanding.  Pt educated throughout in the form of demo/VC/TC to facilitate improved movement at target joints and correct muscle activation with exercises. The pt exhibits good carryover within session after cuing.     PT Short Term Goals - 08/27/20 1110       PT SHORT TERM GOAL #1   Title Patient will be independent in home exercise program to improve strength/mobility for better functional independence with ADLs.    Baseline 3/3:  established HEP today. See medbridge access code; 06/07/2020 pt reports doing some of his HEP, but that he has difficulty with some of the supine exercises; 4/28: pt not yet fully indep. with HEP; 5/10: pt inconsistently performing HEP; 6/20: doing HEP 3x a week;    Time 4    Period Weeks    Status Achieved    Target Date 08/02/20               PT Long Term Goals - 08/27/20 1110       PT LONG TERM GOAL #1   Title Patient will increase FOTO score equal to or greater than 69 to demonstrate statistically significant improvement in mobility and quality of life.    Baseline 3/3: 55; 3/31 59%; 4/28: 60%; 5/9: 56%, 6/20: 63%    Time 4    Period Weeks    Status On-going    Target Date 09/24/20      PT LONG TERM GOAL #2   Title Patient (> 65 years old) will complete five times sit to stand test in < 12 seconds indicating an increased LE strength and improved balance.    Baseline 3/3: 15.38 m/s; 06/07/2020:  11 sec, 6/20: 13.5 sec with arms across chest;    Time 4    Period Weeks    Status Partially Met      PT LONG TERM GOAL #3   Title Pt will improve hip extension AROM > 10 deg bilaterally to normalize reciprocal gait and balance.    Baseline 3/3: R/L 4 deg/3 deg; 3/31 R/L 5 deg/3 deg; 4/28: 5 deg R, 0 deg. L; 4/9: 4-5 deg. ext B, 6/20: 8 degrees bilaterally;    Time 4    Period Weeks    Status On-going    Target Date 09/24/20      PT LONG TERM GOAL #4   Title Pt will improve Berg score > 46 to display decreased risk of falls.    Baseline 3/3: Will assess upon MD approval; 06/07/2020 52/56; 4/28: 55/56    Time 8    Period Weeks    Status Achieved      PT LONG TERM GOAL #5   Title The patient will be able to maintain at least 10 seconds of SLB on BLEs in order to decrease risk of falls when navigating obstacles, curbs and steps.    Baseline 06/07/2020: pt unable to maintain more than 3 sec of SLB without UE support; 4/28: 15 sec BLEs    Time 4    Period Weeks    Status Achieved       PT LONG TERM GOAL #6   Title Patient will increase dynamic  gait index score to >20/24 as to demonstrate reduced fall risk and improved dynamic gait balance for better safety with community/home ambulation.    Baseline 4/28: 17/24; 5/9: 19/24, 6/20: 20/24    Time 4    Period Weeks    Status Partially Met    Target Date 09/24/20               Plan - 09/06/20 1522     Clinical Impression Statement Session somewhat limited secondary to prolonged rest breaks. Although session limited, pt is highly motivated in PT. Pt continues to maintain improvement with STS technique, and was able to perform increased reps of STS today compared to previous sessions. The pt was most challenged with static lunges, reporting difficulty and fatigue with exercise. The pt will benefit from further skilled PT to continue to improve BLE strength and balance in order to increase ease with all functional mobility.    Personal Factors and Comorbidities Age;Comorbidity 3+;Past/Current Experience    Comorbidities A-fib, lumbar disc disease, prostate cancer, and  history of TIA.    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    Rehab Potential Good    PT Frequency 2x / week    PT Duration 4 weeks    PT Treatment/Interventions ADLs/Self Care Home Management;Biofeedback;Aquatic Therapy;Canalith Repostioning;Cryotherapy;Electrical Stimulation;Iontophoresis 1m/ml Dexamethasone;Moist Heat;Traction;Ultrasound;Gait training;Stair training;DME Instruction;Functional mobility training;Therapeutic activities;Therapeutic exercise;Balance training;Neuromuscular re-education;Patient/family education;Manual techniques;Passive range of motion;Dry needling;Energy conservation;Vestibular;Joint Manipulations    PT Next Visit Plan Wants to practice again  with alternating foot taps on soccer ball; hip extension exercises/strethces, lunges    PT Home Exercise Plan added quadruped knee flexion stretch and half stretch off a step today, no updates on this date    Consulted and Agree with Plan of Care Patient             Patient will benefit from skilled therapeutic intervention in order to improve the following deficits and impairments:  Abnormal gait, Decreased balance, Decreased range of motion, Decreased strength, Hypomobility, Impaired sensation, Pain, Improper body mechanics, Impaired flexibility, Decreased mobility  Visit Diagnosis: Muscle weakness (generalized)  Unsteadiness on feet  Other abnormalities of gait and mobility     Problem List There are no problems to display for this patient.  HRicard DillonPT, DPT 09/06/2020, 3:30 PM  CGrain ValleyMAIN RNorthshore [facility] HospitalSERVICES [address] [address]Phone: [phone]  Fax:  [phone] Name: CBREYLEN AGYEMANMRN: [id number] of Birth: [birth year]

## 2020-09-13 ENCOUNTER — Ambulatory Visit: Payer: Medicare Other | Attending: Internal Medicine

## 2020-09-13 ENCOUNTER — Other Ambulatory Visit: Payer: Self-pay

## 2020-09-13 DIAGNOSIS — R2681 Unsteadiness on feet: Secondary | ICD-10-CM | POA: Diagnosis present

## 2020-09-13 DIAGNOSIS — M6281 Muscle weakness (generalized): Secondary | ICD-10-CM | POA: Insufficient documentation

## 2020-09-13 DIAGNOSIS — R2689 Other abnormalities of gait and mobility: Secondary | ICD-10-CM | POA: Diagnosis present

## 2020-09-13 DIAGNOSIS — R278 Other lack of coordination: Secondary | ICD-10-CM | POA: Insufficient documentation

## 2020-09-13 DIAGNOSIS — M545 Low back pain, unspecified: Secondary | ICD-10-CM | POA: Insufficient documentation

## 2020-09-13 NOTE — Therapy (Signed)
[place] MAIN [facility] SERVICES [address] [place], [name], [address] Phone: [phone]   Fax:  [phone]  Physical Therapy Treatment  Patient Details  Name: Nicholas [name] MRN: [id number] Date of Birth: 1945-12-26 Referring Provider (PT): Rusty Aus, MD   Encounter Date: 09/13/2020   PT End of Session - 09/13/20 1449     Visit Number 34    Number of Visits 40    Date for PT Re-Evaluation 09/25/20    Authorization Type [employer] Medicare, BCBS supplemental    Authorization Time Period 06/21/22- 06/08/00; recert on 10/02/34    PT Start Time 1102    PT Stop Time 1145    PT Time Calculation (min) 43 min    Equipment Utilized During Treatment Gait belt    Activity Tolerance Patient tolerated treatment well    Behavior During Therapy [facility] for tasks assessed/performed             Past Medical History:  Diagnosis Date   A-fib ([place])    Cancer ([place]) 10/25/2019   Prostate   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    Subjective Assessment - 09/13/20 1059     Subjective Pt reports he was sore after last appointment. He reports doing HEP. Pt reports he is a little sore today, but nothing is hurting.    Pertinent History Pt is a pleasant 75 y.o. male referred to PT for LBP which occured early February. Pt has a PMH of prostate cx, TIA, chronic a-fib, and recent hematuria. Pt has pain with t/f from STS but feels better with walking. Pt states his LBP has been improving but his knees and hips have  neem giving him more pain. Pt's LBP is a strong ache that is on the R side but denies radicular symptoms. Pt reports exercises given by MD have been beneficial and improving his LBP symptoms. Currently in no pain during subjective. when LBP is flared up he reports 4-5/10 NPS. Worst pain is 5-6/10 NPS but pt is unable to explain what causes worst pain but reports possibly most pain due sitting for long periods of time. Prednisone has improved LBP. Pt reports his goals in therapy are to improve balance although he did not mention to Dr. Sabra [name] when referred to PT for his LBP. Also wants to improve his strength. He reports he requires objects to help support him when walking such as walls or furniture. Pt denies falls, but  spouse reports a fall last fall at home where he tripped over a blanket. Has reported near falls where he needed touching of an object to correct. Pt uses a stick now and then for balance but does report difficulty transferring from sturdy to unstardy surfaces (i.e. stepping over a stream) and walking stick helps.    Currently in Pain? No/denies            TREATMENT   Therex- In // bars with CGA: Walking lunges - 6x length of  bars with BUE support  Static lunges - 2x8 BLEs  Resisted walking with 22.5# to 27.5# - x multiple reps of each. Close CGA. Pt rates medium.  STS - 2x11; pt rates medium. No instances of posterior weight shift, observed improvement in technique and postural stability compared to prior sessions.   Standing heel raises - 1x20, 1x15, BLEs; difficulty with ROM B    Neuro Re-Ed - CGA- min assist provided throughout    At support surface- Tandem stance - 4x60 sec each LE - intermittent UE support Slow marches - 2x20 reps. Intermittent UE support.  Slow walking marches - 6x length of bars. Intermittent UE support. Improves stability with reps.    Pt educated throughout in the form of demo/VC/TC to facilitate improved movement at target joints and correct  muscle activation with exercises. The pt exhibits good carryover within session after cuing.       PT Education - 09/13/20 1449     Education Details exercise technique, body mechanics    Person(s) Educated Patient    Methods Explanation;Demonstration;Verbal cues    Comprehension Verbalized understanding;Returned demonstration              PT Short Term Goals - 08/27/20 1110       PT SHORT TERM GOAL #1   Title Patient will be independent in home exercise program to improve strength/mobility for better functional independence with ADLs.    Baseline 3/3: established HEP today. See medbridge access code; 06/07/2020 pt reports doing some of his HEP, but that he has difficulty with some of the supine exercises; 4/28: pt not yet fully indep. with HEP; 5/10: pt inconsistently performing HEP; 6/20: doing HEP 3x a week;    Time 4    Period Weeks    Status Achieved    Target Date 08/02/20               PT Long Term Goals - 08/27/20 1110       PT LONG TERM GOAL #1   Title Patient will increase FOTO score equal to or greater than 69 to demonstrate statistically significant improvement in mobility and quality of life.    Baseline 3/3: 55; 3/31 59%; 4/28: 60%; 5/9: 56%, 6/20: 63%    Time 4    Period Weeks    Status On-going    Target Date 09/24/20      PT LONG TERM GOAL #2   Title Patient (> 28 years old) will complete five times sit to stand test in < 12 seconds indicating an increased LE strength and improved balance.    Baseline 3/3: 15.38 m/s; 06/07/2020:  11 sec, 6/20: 13.5 sec with arms across chest;    Time 4    Period Weeks    Status Partially Met      PT LONG TERM GOAL #3   Title Pt will improve hip extension AROM > 10 deg bilaterally to normalize reciprocal gait and balance.    Baseline 3/3: R/L 4 deg/3 deg; 3/31 R/L 5 deg/3 deg; 4/28: 5 deg R, 0 deg. L; 4/9: 4-5 deg. ext B, 6/20: 8 degrees bilaterally;    Time 4    Period Weeks    Status On-going    Target Date  09/24/20      PT LONG TERM GOAL #4   Title Pt will improve Berg score > 46 to display decreased risk of falls.    Baseline 3/3: Will assess upon MD approval; 06/07/2020 52/56; 4/28: 55/56  Time 8    Period Weeks    Status Achieved      PT LONG TERM GOAL #5   Title The patient will be able to maintain at least 10 seconds of SLB on BLEs in order to decrease risk of falls when navigating obstacles, curbs and steps.    Baseline 06/07/2020: pt unable to maintain more than 3 sec of SLB without UE support; 4/28: 15 sec BLEs    Time 4    Period Weeks    Status Achieved      PT LONG TERM GOAL #6   Title Patient will increase dynamic gait index score to >20/24 as to demonstrate reduced fall risk and improved dynamic gait balance for better safety with community/home ambulation.    Baseline 4/28: 17/24; 5/9: 19/24, 6/20: 20/24    Time 4    Period Weeks    Status Partially Met    Target Date 09/24/20                   Plan - 09/13/20 1453     Clinical Impression Statement Pt shows improved postural stability wtih STS with no instances of posterior weight-shift or LOB, indicating carryover between sessoins. Although pt shows improvement, he still is challenged with lunges and balance tasks requiring a NBOS. The pt will benefit from further skilled PT to improve BLE strength and balance in order to increase safety with all functional mobility.    Personal Factors and Comorbidities Age;Comorbidity 3+;Past/Current Experience    Comorbidities A-fib, lumbar disc disease, prostate cancer, and  history of TIA.    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    Rehab Potential Good    PT Frequency 2x / week    PT Duration 4 weeks    PT Treatment/Interventions ADLs/Self Care Home Management;Biofeedback;Aquatic  Therapy;Canalith Repostioning;Cryotherapy;Electrical Stimulation;Iontophoresis 22m/ml Dexamethasone;Moist Heat;Traction;Ultrasound;Gait training;Stair training;DME Instruction;Functional mobility training;Therapeutic activities;Therapeutic exercise;Balance training;Neuromuscular re-education;Patient/family education;Manual techniques;Passive range of motion;Dry needling;Energy conservation;Vestibular;Joint Manipulations    PT Next Visit Plan Wants to practice again with alternating foot taps on soccer ball; hip extension exercises/strethces, lunges    PT Home Exercise Plan added quadruped knee flexion stretch and half stretch off a step today, no updates on this date    Consulted and Agree with Plan of Care Patient             Patient will benefit from skilled therapeutic intervention in order to improve the following deficits and impairments:  Abnormal gait, Decreased balance, Decreased range of motion, Decreased strength, Hypomobility, Impaired sensation, Pain, Improper body mechanics, Impaired flexibility, Decreased mobility  Visit Diagnosis: Muscle weakness (generalized)  Unsteadiness on feet  Other abnormalities of gait and mobility     Problem List There are no problems to display for this patient.  HRicard DillonPT, DPT 09/13/2020, 2:56 PM  [place] [facility] [address] [address]Phone: [phone]  Fax:  [phone] Name: CCRAIGORY TOSTEMRN: [id number] of Birth: [birth year]

## 2020-09-15 ENCOUNTER — Encounter: Payer: Self-pay | Admitting: Dermatology

## 2020-09-17 ENCOUNTER — Other Ambulatory Visit: Payer: Self-pay

## 2020-09-17 ENCOUNTER — Ambulatory Visit: Payer: Medicare Other

## 2020-09-17 DIAGNOSIS — M6281 Muscle weakness (generalized): Secondary | ICD-10-CM

## 2020-09-17 DIAGNOSIS — R278 Other lack of coordination: Secondary | ICD-10-CM

## 2020-09-17 DIAGNOSIS — R2689 Other abnormalities of gait and mobility: Secondary | ICD-10-CM

## 2020-09-17 DIAGNOSIS — R2681 Unsteadiness on feet: Secondary | ICD-10-CM

## 2020-09-17 NOTE — Therapy (Signed)
[place] MAIN [facility] SERVICES [address] [place], [name], [address] Phone: [phone]   Fax:  [phone]  Physical Therapy Treatment  Patient Details  Name: Nicholas [name] MRN: [id number] Date of Birth: 1945-05-26 Referring Provider (PT): Rusty Aus, MD   Encounter Date: 09/17/2020   PT End of Session - 09/17/20 1647     Visit Number 35    Number of Visits 40    Date for PT Re-Evaluation 09/25/20    Authorization Type [employer] Medicare, BCBS supplemental    Authorization Time Period 7/56/43- 06/05/49; recert on 8/84/16    PT Start Time 1147    PT Stop Time 1229    PT Time Calculation (min) 42 min    Equipment Utilized During Treatment Gait belt    Activity Tolerance Patient tolerated treatment well    Behavior During Therapy [facility] for tasks assessed/performed             Past Medical History:  Diagnosis Date   A-fib ([place])    Cancer ([place]) 10/25/2019   Prostate   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   Subjective Assessment - 09/17/20 1644     Subjective Pt had a good weekend. He reports his knees have been feeling stiff and that he has to flex/extend them for sensation to improve.    Pertinent History Pt is a pleasant 75 y.o. male referred to PT for LBP which occured early February. Pt has a PMH of prostate cx, TIA, chronic a-fib, and recent hematuria. Pt has pain with t/f from STS but feels better with walking. Pt states his LBP has been improving but his knees and hips have  neem giving him more pain. Pt's LBP is a strong ache that is on the R side but denies radicular symptoms. Pt reports exercises given by MD have been beneficial and improving his LBP symptoms. Currently in no pain during subjective. when LBP is flared up he reports 4-5/10 NPS. Worst pain is 5-6/10 NPS but pt is unable to explain what causes worst pain but reports possibly most pain due sitting for long periods of time. Prednisone has improved LBP. Pt reports his goals in therapy are to improve balance although he did not mention to Dr. Sabra [name] when referred to PT for his LBP. Also wants to improve his strength. He reports he requires objects to help support him when walking such as walls or furniture. Pt denies falls, but  spouse reports a fall last fall at home where he tripped over a blanket. Has reported near falls where he needed touching of an object to correct. Pt uses a stick now and then for balance but does report difficulty transferring from sturdy to unstardy surfaces (i.e. stepping over a stream) and walking stick helps.    Currently in Pain? No/denies           TREATMENT     Neuro Re-Ed - CGA- min assist provided throughout   Ankle pumps  20x for warm-up/motor control d/t neuropathy Alphabet - one round each LE for warm-up/motor control d/t neuropathy   At support surface-  Semi-tandem stance, EO - 60 sec each LE Semi-tandem stance, EO, horizontal head-turns - 10x each side with each LE as stance leg Semi-tandem stance, EO, vertical head turns - 10x up and down with each LE as stance leg Semi-tandem stance, EC, - 60 sec each LE CGA-min a. Intermittent UE support throughout.  Tandem stance - x60 sec each LE - intermittent UE support  Slow static marches - 2x20 reps. Cuing to decrease speed.    Static bosu lunges - 2x60 sec each LE  SLB progression, cone taps: --3 cones, pt performs on each LE. Greatest difficulty crossing to furthest cone contralaterally.  --2 cones, tapping  forward and across. Cuing to decrease speed, improves with reps, requires less frequent UE support.     Pt educated throughout in the form of demo/VC/TC to facilitate improved movement at target joints and correct muscle activation with exercises. The pt exhibits good carryover within session after cuing.     PT Short Term Goals - 08/27/20 1110       PT SHORT TERM GOAL #1   Title Patient will be independent in home exercise program to improve strength/mobility for better functional independence with ADLs.    Baseline 3/3: established HEP today. See medbridge access code; 06/07/2020 pt reports doing some of his HEP, but that he has difficulty with some of the supine exercises; 4/28: pt not yet fully indep. with HEP; 5/10: pt inconsistently performing HEP; 6/20: doing HEP 3x a week;    Time 4    Period Weeks    Status Achieved    Target Date 08/02/20               PT Long Term Goals - 08/27/20 1110       PT LONG TERM GOAL #1   Title Patient will increase FOTO score equal to or greater than 69 to demonstrate statistically significant improvement in mobility and quality of life.    Baseline 3/3: 55; 3/31 59%; 4/28: 60%; 5/9: 56%, 6/20: 63%    Time 4    Period Weeks    Status On-going    Target Date 09/24/20      PT LONG TERM GOAL #2   Title Patient (> 75 years old) will complete five times sit to stand test in < 12 seconds indicating an increased LE strength and improved balance.    Baseline 3/3: 15.38 m/s; 06/07/2020:  11 sec, 6/20: 13.5 sec with arms across chest;    Time 4    Period Weeks    Status Partially Met      PT LONG TERM GOAL #3   Title Pt will improve hip extension AROM > 10 deg bilaterally to normalize reciprocal gait and balance.    Baseline 3/3: R/L 4 deg/3 deg; 3/31 R/L 5 deg/3 deg; 4/28: 5 deg R, 0 deg. L; 4/9: 4-5 deg. ext B, 6/20: 8 degrees bilaterally;    Time 4    Period Weeks    Status On-going    Target Date 09/24/20      PT LONG TERM GOAL #4    Title Pt will improve Berg score > 46 to display decreased risk of falls.    Baseline 3/3: Will assess upon MD approval; 06/07/2020 52/56; 4/28: 55/56    Time 8    Period Weeks    Status Achieved  PT LONG TERM GOAL #5   Title The patient will be able to maintain at least 10 seconds of SLB on BLEs in order to decrease risk of falls when navigating obstacles, curbs and steps.    Baseline 06/07/2020: pt unable to maintain more than 3 sec of SLB without UE support; 4/28: 15 sec BLEs    Time 4    Period Weeks    Status Achieved      PT LONG TERM GOAL #6   Title Patient will increase dynamic gait index score to >20/24 as to demonstrate reduced fall risk and improved dynamic gait balance for better safety with community/home ambulation.    Baseline 4/28: 17/24; 5/9: 19/24, 6/20: 20/24    Time 4    Period Weeks    Status Partially Met    Target Date 09/24/20                   Plan - 09/17/20 1648     Clinical Impression Statement Pt continues to show improvement with static balance by performing standing bosu lunges with only intermittent UE support. The pt is still challenged with SLB tasks such as cone taps, having most difficulty with eccentric control of LE contralat. to stance leg. The pt will benefit from further skilled PT to improve balance and decrease fall risk.    Personal Factors and Comorbidities Age;Comorbidity 3+;Past/Current Experience    Comorbidities A-fib, lumbar disc disease, prostate cancer, and  history of TIA.    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    Rehab Potential Good    PT Frequency 2x / week    PT Duration 4 weeks    PT Treatment/Interventions ADLs/Self Care Home Management;Biofeedback;Aquatic Therapy;Canalith Repostioning;Cryotherapy;Electrical  Stimulation;Iontophoresis 62m/ml Dexamethasone;Moist Heat;Traction;Ultrasound;Gait training;Stair training;DME Instruction;Functional mobility training;Therapeutic activities;Therapeutic exercise;Balance training;Neuromuscular re-education;Patient/family education;Manual techniques;Passive range of motion;Dry needling;Energy conservation;Vestibular;Joint Manipulations    PT Next Visit Plan Wants to practice again with alternating foot taps on soccer ball; hip extension exercises/strethces, lunges    PT Home Exercise Plan added quadruped knee flexion stretch and half stretch off a step today, no updates on this date    Consulted and Agree with Plan of Care Patient             Patient will benefit from skilled therapeutic intervention in order to improve the following deficits and impairments:  Abnormal gait, Decreased balance, Decreased range of motion, Decreased strength, Hypomobility, Impaired sensation, Pain, Improper body mechanics, Impaired flexibility, Decreased mobility  Visit Diagnosis: Unsteadiness on feet  Other abnormalities of gait and mobility  Other lack of coordination  Muscle weakness (generalized)     Problem List There are no problems to display for this patient.  HRicard DillonPT, DPT 09/17/2020, 4:55 PM  [place]MAIN R[facility]SERV[facility] [address] [address]Phone: [phone]  Fax:  [phone] Name: Nicholas PITCOCKMRN: [id number] of Birth: 1Sep 08, 1947

## 2020-09-20 ENCOUNTER — Ambulatory Visit: Payer: Medicare Other | Admitting: Physical Therapy

## 2020-09-20 ENCOUNTER — Encounter: Payer: Self-pay | Admitting: Physical Therapy

## 2020-09-20 ENCOUNTER — Other Ambulatory Visit: Payer: Self-pay

## 2020-09-20 DIAGNOSIS — M6281 Muscle weakness (generalized): Secondary | ICD-10-CM | POA: Diagnosis not present

## 2020-09-20 DIAGNOSIS — R2681 Unsteadiness on feet: Secondary | ICD-10-CM

## 2020-09-20 DIAGNOSIS — R278 Other lack of coordination: Secondary | ICD-10-CM

## 2020-09-20 DIAGNOSIS — R2689 Other abnormalities of gait and mobility: Secondary | ICD-10-CM

## 2020-09-20 NOTE — Therapy (Signed)
[place] MAIN [facility] SERVICES [address] [place], [name], [address] Phone: [phone]   Fax:  [phone]  Physical Therapy Treatment  Patient Details  Name: Nicholas [name] MRN: [id number] Date of Birth: 1945/05/08 Referring Provider (PT): Rusty Aus, MD   Encounter Date: 09/20/2020   PT End of Session - 09/20/20 1207     Visit Number 36    Number of Visits 40    Date for PT Re-Evaluation 09/25/20    Authorization Type [employer] Medicare, BCBS supplemental    Authorization Time Period 8/56/31- 4/97/02; recert on 6/37/85    PT Start Time 1148    PT Stop Time 1228    PT Time Calculation (min) 40 min    Equipment Utilized During Treatment Gait belt    Activity Tolerance Patient tolerated treatment well    Behavior During Therapy [facility] for tasks assessed/performed             Past Medical History:  Diagnosis Date   A-fib ([place])    Cancer ([place]) 10/25/2019   Prostate   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   Subjective Assessment - 09/20/20 1155     Subjective Pt reports having to move a dresser and pulled something in his back and is feeling a lot of stiffness in lumbar spine;    Pertinent History Pt is a pleasant 75 y.o. male referred to PT for LBP which occured early February. Pt has a PMH of prostate cx, TIA, chronic a-fib, and recent hematuria. Pt has pain with t/f from STS but feels better with walking. Pt states his LBP has been improving but his knees and hips have neem giving  him more pain. Pt's LBP is a strong ache that is on the R side but denies radicular symptoms. Pt reports exercises given by MD have been beneficial and improving his LBP symptoms. Currently in no pain during subjective. when LBP is flared up he reports 4-5/10 NPS. Worst pain is 5-6/10 NPS but pt is unable to explain what causes worst pain but reports possibly most pain due sitting for long periods of time. Prednisone has improved LBP. Pt reports his goals in therapy are to improve balance although he did not mention to Dr. Sabra [name] when referred to PT for his LBP. Also wants to improve his strength. He reports he requires objects to help support him when walking such as walls or furniture. Pt denies falls, but  spouse reports a fall last fall at home where he tripped over a blanket. Has reported near falls where he needed touching of an object to correct. Pt uses a stick now and then for balance but does report difficulty transferring from sturdy to unstardy surfaces (i.e. stepping over a stream) and walking stick helps.    Currently in Pain? Yes    Pain Score 3     Pain Location Back    Pain Orientation Lower    Pain Descriptors / Indicators Tightness  Pain Type Acute pain    Pain Onset In the past 7 days    Pain Frequency Intermittent    Aggravating Factors  worse with stooping    Pain Relieving Factors stretch    Effect of Pain on Daily Activities decreased activity tolerance;    Multiple Pain Sites No                 TREATMENT:  Patient presents with increased stiffness in lumbar spine  Patient hooklying: Lumbar trunk rotation 10 sec hold x10 reps each direction BLE knee to chest pball stretch 10 sec hold x5 reps  Pball lumbar trunk rotation x5 reps each direction to facilitate better ROM; Hooklying positioning: Posterior pelvic tilt 5 sec hold x10 reps with min VCS for proper positioning/exercise technique;   Qped: Cat/camel stretch 5 sec hold x5 reps each with min VCs to  focus on lumbar ROM to help alleviate stiffness;  Neuro Re-Ed - CGA- min assist provided throughout   Standing on airex pad: -Feet together eyes open 30 sec hold, eyes closed 15 sec hold x2 reps each -Heel/toe raise 3 sec hold unsupported x10 reps, required min A for safety with decreased ankle control noted. Patient also instructed to reduce knee flexion to isolate calf strengthening;   Standing on 1/2 bolster: -heel/toe rock x10 reps -feet in neutral: BUE wand flexion x10 reps with CGA for safety Patient exhibits decreased ankle strategies with increased hip strategies for balance control;     Pt educated throughout in the form of demo/VC/TC to facilitate improved movement at target joints and correct muscle activation with exercises. The pt exhibits good carryover within session after cuing.    He reports less pain and stiffness at end of session. He also reports being able to take a better step with improved heel/toe gait following balance exercise.                         PT Education - 09/20/20 1207     Education Details exercise technique/stretches, balance/strengthening    Person(s) Educated Patient    Methods Explanation;Verbal cues    Comprehension Verbalized understanding;Returned demonstration;Verbal cues required;Need further instruction              PT Short Term Goals - 08/27/20 1110       PT SHORT TERM GOAL #1   Title Patient will be independent in home exercise program to improve strength/mobility for better functional independence with ADLs.    Baseline 3/3: established HEP today. See medbridge access code; 06/07/2020 pt reports doing some of his HEP, but that he has difficulty with some of the supine exercises; 4/28: pt not yet fully indep. with HEP; 5/10: pt inconsistently performing HEP; 6/20: doing HEP 3x a week;    Time 4    Period Weeks    Status Achieved    Target Date 08/02/20               PT Long Term Goals - 08/27/20 1110        PT LONG TERM GOAL #1   Title Patient will increase FOTO score equal to or greater than 69 to demonstrate statistically significant improvement in mobility and quality of life.    Baseline 3/3: 55; 3/31 59%; 4/28: 60%; 5/9: 56%, 6/20: 63%    Time 4    Period Weeks    Status On-going    Target Date 09/24/20      PT LONG TERM   GOAL #2   Title Patient (> 14 years old) will complete five times sit to stand test in < 12 seconds indicating an increased LE strength and improved balance.    Baseline 3/3: 15.38 m/s; 06/07/2020:  11 sec, 6/20: 13.5 sec with arms across chest;    Time 4    Period Weeks    Status Partially Met      PT LONG TERM GOAL #3   Title Pt will improve hip extension AROM > 10 deg bilaterally to normalize reciprocal gait and balance.    Baseline 3/3: R/L 4 deg/3 deg; 3/31 R/L 5 deg/3 deg; 4/28: 5 deg R, 0 deg. L; 4/9: 4-5 deg. ext B, 6/20: 8 degrees bilaterally;    Time 4    Period Weeks    Status On-going    Target Date 09/24/20      PT LONG TERM GOAL #4   Title Pt will improve Berg score > 46 to display decreased risk of falls.    Baseline 3/3: Will assess upon MD approval; 06/07/2020 52/56; 4/28: 55/56    Time 8    Period Weeks    Status Achieved      PT LONG TERM GOAL #5   Title The patient will be able to maintain at least 10 seconds of SLB on BLEs in order to decrease risk of falls when navigating obstacles, curbs and steps.    Baseline 06/07/2020: pt unable to maintain more than 3 sec of SLB without UE support; 4/28: 15 sec BLEs    Time 4    Period Weeks    Status Achieved      PT LONG TERM GOAL #6   Title Patient will increase dynamic gait index score to >20/24 as to demonstrate reduced fall risk and improved dynamic gait balance for better safety with community/home ambulation.    Baseline 4/28: 17/24; 5/9: 19/24, 6/20: 20/24    Time 4    Period Weeks    Status Partially Met    Target Date 09/24/20                   Plan - 09/20/20 1342      Clinical Impression Statement Patient experiencing increased back pain this session due to recently moving furniture. He was instructed in lumbar flexion exercise with good tolerance. Patient does require min VCs for proper positioning/exercise technique. He reports significant reduction in stiffness following exercise. patient was instructed in advanced balance exercise. He does have difficulty maintaining balance while on compliant surface with less rail assist with decreased ankle strategies. patient often relies on hip strategies for balance control. He required CGA to min A for safety especially on compliant/unlevel surfaces. He would benefit from additional skilled PT Intervention to improve strength, balance and gait safety;    Personal Factors and Comorbidities Age;Comorbidity 3+;Past/Current Experience    Comorbidities A-fib, lumbar disc disease, prostate cancer, and  history of TIA.    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    Rehab Potential Good    PT Frequency 2x / week    PT Duration 4 weeks    PT Treatment/Interventions ADLs/Self Care Home Management;Biofeedback;Aquatic Therapy;Canalith Repostioning;Cryotherapy;Electrical Stimulation;Iontophoresis 88m/ml Dexamethasone;Moist Heat;Traction;Ultrasound;Gait training;Stair training;DME Instruction;Functional mobility training;Therapeutic activities;Therapeutic exercise;Balance training;Neuromuscular re-education;Patient/family education;Manual techniques;Passive range of motion;Dry needling;Energy conservation;Vestibular;Joint Manipulations    PT Next Visit Plan Wants to practice again with alternating foot taps on soccer ball; hip extension  exercises/strethces, lunges    PT Home Exercise Plan added quadruped knee flexion stretch and half stretch off a step  today, no updates on this date    Consulted and Agree with Plan of Care Patient             Patient will benefit from skilled therapeutic intervention in order to improve the following deficits and impairments:  Abnormal gait, Decreased balance, Decreased range of motion, Decreased strength, Hypomobility, Impaired sensation, Pain, Improper body mechanics, Impaired flexibility, Decreased mobility  Visit Diagnosis: Unsteadiness on feet  Other abnormalities of gait and mobility  Other lack of coordination  Muscle weakness (generalized)     Problem List There are no problems to display for this patient.   Raynie Steinhaus PT, DPT 09/20/2020, 1:44 PM  [place] MAIN [facility] SERVICES [address] [place], [name], [address] Phone: [phone]   Fax:  [phone]  Name: Nicholas [name] MRN: [id number] Date of Birth: [birth year]

## 2020-09-24 ENCOUNTER — Ambulatory Visit: Payer: Medicare Other

## 2020-09-24 ENCOUNTER — Other Ambulatory Visit: Payer: Self-pay

## 2020-09-24 DIAGNOSIS — M6281 Muscle weakness (generalized): Secondary | ICD-10-CM

## 2020-09-24 DIAGNOSIS — R2681 Unsteadiness on feet: Secondary | ICD-10-CM

## 2020-09-24 DIAGNOSIS — R278 Other lack of coordination: Secondary | ICD-10-CM

## 2020-09-24 DIAGNOSIS — R2689 Other abnormalities of gait and mobility: Secondary | ICD-10-CM

## 2020-09-24 DIAGNOSIS — M545 Low back pain, unspecified: Secondary | ICD-10-CM

## 2020-09-24 NOTE — Therapy (Signed)
[name] [facility] SERVICES [address] [place], [name], [address] Phone: [phone]   Fax:  [phone]  Physical Therapy Treatment  Patient Details  Name: Nicholas [name] MRN: [id number] Date of Birth: Dec 27, 1945 Referring Provider (PT): Rusty Aus, MD   Encounter Date: 09/24/2020   PT End of Session - 09/24/20 1514     Visit Number 37    Number of Visits 40    Date for PT Re-Evaluation 09/25/20    Authorization Type [employer] Medicare, BCBS supplemental    Authorization Time Period 7/91/50- 5/69/79; recert on 4/80/16    PT Start Time 1131    PT Stop Time 1218    PT Time Calculation (min) 47 min    Equipment Utilized During Treatment Gait belt    Activity Tolerance Patient tolerated treatment well    Behavior During Therapy [facility] for tasks assessed/performed             Past Medical History:  Diagnosis Date   A-fib ([place])    Cancer ([place]) 10/25/2019   Prostate   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   Subjective Assessment - 09/24/20 1127     Subjective Pt reports moving more things this past weekend, including heavy chairs in the yard. he reports continued back pain/stiffness.    Pertinent History Pt is a pleasant 75 y.o. male referred to PT for LBP which occured early February. Pt has a PMH of prostate cx, TIA, chronic a-fib, and recent hematuria. Pt has pain with t/f from STS but feels better with walking. Pt states his LBP has been improving but his knees and hips have neem  giving him more pain. Pt's LBP is a strong ache that is on the R side but denies radicular symptoms. Pt reports exercises given by MD have been beneficial and improving his LBP symptoms. Currently in no pain during subjective. when LBP is flared up he reports 4-5/10 NPS. Worst pain is 5-6/10 NPS but pt is unable to explain what causes worst pain but reports possibly most pain due sitting for long periods of time. Prednisone has improved LBP. Pt reports his goals in therapy are to improve balance although he did not mention to Dr. Sabra [name] when referred to PT for his LBP. Also wants to improve his strength. He reports he requires objects to help support him when walking such as walls or furniture. Pt denies falls, but  spouse reports a fall last fall at home where he tripped over a blanket. Has reported near falls where he needed touching of an object to correct. Pt uses a stick now and then for balance but does report difficulty transferring from sturdy to unstardy surfaces (i.e. stepping over a stream) and walking stick helps.    Currently in Pain? Yes    Pain Location Back    Pain Orientation Lower    Pain Onset In the past 7 days  TREATMENT:  Patient continues to present with increased stiffness in lumbar spine after lifting heavy objects in his yard this weekend.  Pt supine on plinth-  Lower trunk rotations - 2x20  Pball hamstring curls - 3x10  Modified deadbug on pball (alternating lift of each LE in full knee ext) 10x each LE; pt reports no pain  Bridges - 10x; discontinued d/t reports of back pain.  Open book - 10x each side. No pain with exercise.   Supine marches with PPT - 3x10 alternating BLEs.  Piriformis stretch - 2x30 sec each LE  Seated: Pball forward/backward, side-to-side - 10x for each, each direction Seated hamstring stretch 2x30 sec each LE   Neuro re-ed- CGA provided throughout; at support surface-   Standing NBOS - 30 sec Standing, semi-tandem -  2x30 sec each LE Standing tandem - 2x30 sec each LE   On airex- Standing, NBOS with vertical and horizontal head turns - 2x30 sec for each One foot on floor one on dynadisc in lunge position - 2x30 sec each LE Cone taps - x multiple reps; requires intermittent UE support Stepping forward/backward over hurdle, side-to-side over hurdle - x multiple reps of each. Knocks into hurdle.      Pt educated throughout in the form of demo/VC/TC to facilitate improved movement at target joints and correct muscle activation with exercises. The pt exhibits good carryover within session after cuing.     Assessment: Continued focus at start of session with pain modulation techniques d/t back pain and stiffness. Pt responds well to majority of therex without pain except back is irritated by glute bridges (which were discontinued). By end of session pt reports improvement in back sx. The pt will benefit from further skilled PT to improve pain, LE strength, and balance to increase safety with all functional mobility and improve QOL    PT Short Term Goals - 08/27/20 1110       PT SHORT TERM GOAL #1   Title Patient will be independent in home exercise program to improve strength/mobility for better functional independence with ADLs.    Baseline 3/3: established HEP today. See medbridge access code; 06/07/2020 pt reports doing some of his HEP, but that he has difficulty with some of the supine exercises; 4/28: pt not yet fully indep. with HEP; 5/10: pt inconsistently performing HEP; 6/20: doing HEP 3x a week;    Time 4    Period Weeks    Status Achieved    Target Date 08/02/20               PT Long Term Goals - 08/27/20 1110       PT LONG TERM GOAL #1   Title Patient will increase FOTO score equal to or greater than 69 to demonstrate statistically significant improvement in mobility and quality of life.    Baseline 3/3: 55; 3/31 59%; 4/28: 60%; 5/9: 56%, 6/20: 63%    Time 4    Period Weeks     Status On-going    Target Date 09/24/20      PT LONG TERM GOAL #2   Title Patient (> 19 years old) will complete five times sit to stand test in < 12 seconds indicating an increased LE strength and improved balance.    Baseline 3/3: 15.38 m/s; 06/07/2020:  11 sec, 6/20: 13.5 sec with arms across chest;    Time 4    Period Weeks    Status Partially Met      PT LONG TERM  GOAL #3   Title Pt will improve hip extension AROM > 10 deg bilaterally to normalize reciprocal gait and balance.    Baseline 3/3: R/L 4 deg/3 deg; 3/31 R/L 5 deg/3 deg; 4/28: 5 deg R, 0 deg. L; 4/9: 4-5 deg. ext B, 6/20: 8 degrees bilaterally;    Time 4    Period Weeks    Status On-going    Target Date 09/24/20      PT LONG TERM GOAL #4   Title Pt will improve Berg score > 46 to display decreased risk of falls.    Baseline 3/3: Will assess upon MD approval; 06/07/2020 52/56; 4/28: 55/56    Time 8    Period Weeks    Status Achieved      PT LONG TERM GOAL #5   Title The patient will be able to maintain at least 10 seconds of SLB on BLEs in order to decrease risk of falls when navigating obstacles, curbs and steps.    Baseline 06/07/2020: pt unable to maintain more than 3 sec of SLB without UE support; 4/28: 15 sec BLEs    Time 4    Period Weeks    Status Achieved      PT LONG TERM GOAL #6   Title Patient will increase dynamic gait index score to >20/24 as to demonstrate reduced fall risk and improved dynamic gait balance for better safety with community/home ambulation.    Baseline 4/28: 17/24; 5/9: 19/24, 6/20: 20/24    Time 4    Period Weeks    Status Partially Met    Target Date 09/24/20                   Plan - 09/24/20 1226     Clinical Impression Statement Continued focus at start of session with pain modulation techniques d/t back pain and stiffness. Pt responds well to majority of therex without pain except back is irritated by glute bridges (which were discontinued). By end of session pt  reports improvement in back sx. The pt will benefit from further skilled PT to improve pain, LE strength, and balance to increase safety with all functional mobility and improve QOL    Personal Factors and Comorbidities Age;Comorbidity 3+;Past/Current Experience    Comorbidities A-fib, lumbar disc disease, prostate cancer, and  history of TIA.    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    Rehab Potential Good    PT Frequency 2x / week    PT Duration 4 weeks    PT Treatment/Interventions ADLs/Self Care Home Management;Biofeedback;Aquatic Therapy;Canalith Repostioning;Cryotherapy;Electrical Stimulation;Iontophoresis 2m/ml Dexamethasone;Moist Heat;Traction;Ultrasound;Gait training;Stair training;DME Instruction;Functional mobility training;Therapeutic activities;Therapeutic exercise;Balance training;Neuromuscular re-education;Patient/family education;Manual techniques;Passive range of motion;Dry needling;Energy conservation;Vestibular;Joint Manipulations    PT Next Visit Plan Wants to practice again with alternating foot taps on soccer ball; hip extension exercises/strethces, lunges, balance with ankle strategy focus    PT Home Exercise Plan added quadruped knee flexion stretch and half stretch off a step today, no updates on this date    Consulted and Agree with Plan of Care Patient             Patient will benefit from skilled therapeutic intervention in order to improve the following deficits and impairments:  Abnormal gait, Decreased balance, Decreased range of motion, Decreased strength, Hypomobility, Impaired sensation, Pain, Improper body mechanics, Impaired flexibility, Decreased mobility  Visit Diagnosis: Acute low back pain, unspecified back pain laterality,  unspecified whether sciatica present  Muscle  weakness (generalized)  Unsteadiness on feet  Other lack of coordination  Other abnormalities of gait and mobility     Problem List There are no problems to display for this patient.  Ricard Dillon PT, DPT 09/24/2020, 3:16 PM  [place] MAIN [facility] SERVICES [address] [place], [name], [address] Phone: [phone]   Fax:  [phone]  Name: Nicholas [name] MRN: [id number] Date of Birth: October 06, 1945

## 2020-09-27 ENCOUNTER — Ambulatory Visit: Payer: Medicare Other

## 2020-09-27 ENCOUNTER — Other Ambulatory Visit: Payer: Self-pay

## 2020-09-27 DIAGNOSIS — M6281 Muscle weakness (generalized): Secondary | ICD-10-CM | POA: Diagnosis not present

## 2020-09-27 DIAGNOSIS — R2689 Other abnormalities of gait and mobility: Secondary | ICD-10-CM

## 2020-09-27 DIAGNOSIS — M545 Low back pain, unspecified: Secondary | ICD-10-CM

## 2020-09-27 DIAGNOSIS — R2681 Unsteadiness on feet: Secondary | ICD-10-CM

## 2020-09-27 NOTE — Therapy (Signed)
[place] MAIN [facility] SERVICES [address] [place], [name], [address] Phone: [phone]   Fax:  [phone]  Physical Therapy Treatment/RECERT  Patient Details  Name: Nicholas [name] MRN: [id number] Date of Birth: [birth year] Referring Provider (PT): Rusty Aus, MD   Encounter Date: 09/27/2020   PT End of Session - 09/28/20 0810     Visit Number 38    Number of Visits 54    Date for PT Re-Evaluation 11/22/20    Authorization Type [employer] Medicare, BCBS supplemental    Authorization Time Period 0/05/11- 0/21/11; recert on 7/35/67; new cert for 0/14-1/03    PT Start Time 1134    PT Stop Time 1223    PT Time Calculation (min) 49 min    Equipment Utilized During Treatment Gait belt    Activity Tolerance Patient tolerated treatment well    Behavior During Therapy [facility] for tasks assessed/performed             Past Medical History:  Diagnosis Date   A-fib ([place])    Cancer ([place]) 10/25/2019   Prostate   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   Subjective Assessment - 09/27/20 1133     Subjective Pt reports he thinks his new recliner is contributing to his current episode of back pain. Pt reports he was in his recliner for about two hours and felt he was sliding down into it at an awkward angle.    Pertinent History Pt is a pleasant 75 y.o. male referred to PT for LBP which occured early February. Pt has a PMH of prostate cx, TIA, chronic a-fib, and recent hematuria. Pt has pain with t/f  from STS but feels better with walking. Pt states his LBP has been improving but his knees and hips have neem giving him more pain. Pt's LBP is a strong ache that is on the R side but denies radicular symptoms. Pt reports exercises given by MD have been beneficial and improving his LBP symptoms. Currently in no pain during subjective. when LBP is flared up he reports 4-5/10 NPS. Worst pain is 5-6/10 NPS but pt is unable to explain what causes worst pain but reports possibly most pain due sitting for long periods of time. Prednisone has improved LBP. Pt reports his goals in therapy are to improve balance although he did not mention to Dr. Sabra [name] when referred to PT for his LBP. Also wants to improve his strength. He reports he requires objects to help support him when walking such as walls or furniture. Pt denies falls, but  spouse reports a fall last fall at home where he tripped over a blanket. Has reported near falls where he needed touching of an object to correct. Pt uses a stick now and then for balance but does report difficulty transferring from sturdy to unstardy surfaces (i.e. stepping over a stream) and walking stick helps.    Currently in Pain? Yes    Pain Location Back  Pain Orientation Lower    Pain Onset In the past 7 days            TREATMENT - reassessment of goals   FOTO: 53   5xSTS warm-up: 16.46 sec Trial 1: 12.3 sec (partially met)  DGI: 22/24 (achieved)  AROM: Hip extension: L 8 deg R 5 deg  Interventions-  Seated pball roll-outs forward/backward 10x with 3 sec holds  Seated pball roll-outs side-to-side 10x with 3 sec holds  Seated thoracic ext. over chair 10x with 3-5 sec hold  Seated hamstring stretch 2x30 sec BLEs  with this back trouble I've been wobbly, but haven't fallen.   Prone press-up on plinth- 10x with 3 sec holds  Pt reports reduction in LBP at end of session: reports back pain decreased from 4/10 to 2/10.   Addition to HEP: Access Code:  [email or web address] URL: [email or web address] Date: 09/27/2020 Prepared by: Ricard Dillon  Exercises Prone Press Up On Elbows - 1 x daily - 7 x weekly - 2 sets - 10 reps    PT Education - 09/28/20 0809     Education Details reassessment findings, indications for PT, POC, addition to HEP    Person(s) Educated Patient    Methods Explanation;Demonstration;Verbal cues;Tactile cues;Handout    Comprehension Verbalized understanding;Returned demonstration;Need further instruction              PT Short Term Goals - 09/27/20 1135       PT SHORT TERM GOAL #1   Title Patient will be independent in home exercise program to improve strength/mobility for better functional independence with ADLs.    Baseline 3/3: established HEP today. See medbridge access code; 06/07/2020 pt reports doing some of his HEP, but that he has difficulty with some of the supine exercises; 4/28: pt not yet fully indep. with HEP; 5/10: pt inconsistently performing HEP; 6/20: doing HEP 3x a week;    Time 4    Period Weeks    Status Achieved    Target Date 08/02/20               PT Long Term Goals - 09/27/20 1137       PT LONG TERM GOAL #1   Title Patient will increase FOTO score equal to or greater than 69 to demonstrate statistically significant improvement in mobility and quality of life.    Baseline 3/3: 55; 3/31 59%; 4/28: 60%; 5/9: 56%, 6/20: 63%; 7/21: 53% (impacted by pt recurrent onset of LBP after lifting heavy furniture)    Time 8    Period Weeks    Status On-going    Target Date 11/22/20      PT LONG TERM GOAL #2   Title Patient (> 28 years old) will complete five times sit to stand test in < 12 seconds indicating an increased LE strength and improved balance.    Baseline 3/3: 15.38 m/s; 06/07/2020:  11 sec, 6/20: 13.5 sec with arms across chest; 7/21: 12.3 sec    Time 8    Period Weeks    Status Partially Met    Target Date 11/22/20      PT LONG TERM GOAL #3   Title Pt will improve  hip extension AROM > 10 deg bilaterally to normalize reciprocal gait and balance.    Baseline 3/3: R/L 4 deg/3 deg; 3/31 R/L 5 deg/3 deg; 4/28: 5 deg R, 0 deg. L; 4/9: 4-5 deg. ext B, 6/20: 8 degrees bilaterally; 7/21: L hip 8 de.g, R hip 5 deg.  Time 8    Period Weeks    Status On-going    Target Date 11/22/20      PT LONG TERM GOAL #4   Title Pt will improve Berg score > 46 to display decreased risk of falls.    Baseline 3/3: Will assess upon MD approval; 06/07/2020 52/56; 4/28: 55/56    Time 8    Period Weeks    Status Achieved      PT LONG TERM GOAL #5   Title The patient will be able to maintain at least 10 seconds of SLB on BLEs in order to decrease risk of falls when navigating obstacles, curbs and steps.    Baseline 06/07/2020: pt unable to maintain more than 3 sec of SLB without UE support; 4/28: 15 sec BLEs    Time 4    Period Weeks    Status Achieved      PT LONG TERM GOAL #6   Title Patient will increase dynamic gait index score to >20/24 as to demonstrate reduced fall risk and improved dynamic gait balance for better safety with community/home ambulation.    Baseline 4/28: 17/24; 5/9: 19/24, 6/20: 20/24; 7/21: 22/24    Time 4    Period Weeks    Status Achieved                   Plan - 09/28/20 3875     Clinical Impression Statement Goals reassessed for recertification. Pt FOTO score decreased to 53 today, impacted by recent recurrence of pt's LBP following lifting heavy furniture and prolonged sitting in new recliner that pt reported was uncomfortable. Although pt saw decrease in FOTO, he saw improvements in 5xSTS, partially meeting goal, and acheived DGI goal, indicating decreased fall risk and improved balance. Hip extension slightly decreased on R side this session. Remainder of session focused on mobility and stretching to LEs for pain modulation and improved ROM. PT added prone-press up to pt's HEP to address poor spinal extension, where pt demonstrated good  technique following cuing. Pt instructed to only perform within pain-free range, and pt verbalized understanding. The pt will benefit from further skilled PT to improve pain, ROM, strength and balance in order to increase QOL and safety with all functional mobility.    Personal Factors and Comorbidities Age;Comorbidity 3+;Past/Current Experience    Comorbidities A-fib, lumbar disc disease, prostate cancer, and  history of TIA.    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    Rehab Potential Good    PT Frequency 2x / week    PT Duration 8 weeks    PT Treatment/Interventions ADLs/Self Care Home Management;Biofeedback;Aquatic Therapy;Canalith Repostioning;Cryotherapy;Electrical Stimulation;Iontophoresis 53m/ml Dexamethasone;Moist Heat;Traction;Ultrasound;Gait training;Stair training;DME Instruction;Functional mobility training;Therapeutic activities;Therapeutic exercise;Balance training;Neuromuscular re-education;Patient/family education;Manual techniques;Passive range of motion;Dry needling;Energy conservation;Vestibular;Joint Manipulations    PT Next Visit Plan hip extension stretching, balance, LE strengthening, spinal mobility    PT Home Exercise Plan added quadruped knee flexion stretch and half stretch off a step today, 7/21:access code: [email or web address]   Consulted and Agree with Plan of Care Patient             Patient will benefit from skilled therapeutic intervention in order to improve the following deficits and impairments:  Abnormal gait, Decreased balance, Decreased range of motion, Decreased strength, Hypomobility, Impaired sensation, Pain, Improper body mechanics, Impaired flexibility, Decreased mobility  Visit Diagnosis: Acute low back pain, unspecified back pain laterality, unspecified whether  sciatica  present  Other abnormalities of gait and mobility  Unsteadiness on feet  Muscle weakness (generalized)     Problem List There are no problems to display for this patient.  Ricard Dillon PT, DPT 09/28/2020, 8:35 AM  [place] MAIN [facility] SERVICES [address] [place], [name], [address] Phone: [phone]   Fax:  [phone]  Name: Nicholas [name] MRN: [id number] Date of Birth: Feb 01, 1946

## 2020-10-01 ENCOUNTER — Other Ambulatory Visit: Payer: Self-pay

## 2020-10-01 ENCOUNTER — Ambulatory Visit: Payer: Medicare Other

## 2020-10-01 DIAGNOSIS — R2681 Unsteadiness on feet: Secondary | ICD-10-CM

## 2020-10-01 DIAGNOSIS — M6281 Muscle weakness (generalized): Secondary | ICD-10-CM | POA: Diagnosis not present

## 2020-10-01 DIAGNOSIS — R2689 Other abnormalities of gait and mobility: Secondary | ICD-10-CM

## 2020-10-01 NOTE — Therapy (Signed)
[place] MAIN [facility] SERVICES [address] [place], [name], [address] Phone: [phone]   Fax:  [phone]  Physical Therapy Treatment  Patient Details  Name: Nicholas [name] MRN: [id number] Date of Birth: 1945/07/24 Referring Provider (PT): Rusty Aus, MD   Encounter Date: 10/01/2020   PT End of Session - 10/01/20 1731     Visit Number 39    Number of Visits 54    Date for PT Re-Evaluation 11/22/20    Authorization Type [employer] Medicare, BCBS supplemental    Authorization Time Period 11/06/92- 0/76/80; recert on 8/81/10; new cert for 3/15-9/45    PT Start Time 1130    PT Stop Time 1214    PT Time Calculation (min) 44 min    Equipment Utilized During Treatment Gait belt    Activity Tolerance Patient tolerated treatment well    Behavior During Therapy [facility] for tasks assessed/performed             Past Medical History:  Diagnosis Date   A-fib ([place])    Cancer ([place]) 10/25/2019   Prostate   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   Subjective Assessment - 10/01/20 1125     Subjective Pt reports continued LBP and stiffness. He reports his grandkids were visiting and just left. No other changes since previous session.    Pertinent History Pt is a pleasant 75 y.o. male referred to PT for LBP which occured early February. Pt has a PMH of prostate cx, TIA, chronic a-fib, and recent hematuria. Pt has pain with t/f from STS but feels better with walking. Pt states his LBP has been improving  but his knees and hips have neem giving him more pain. Pt's LBP is a strong ache that is on the R side but denies radicular symptoms. Pt reports exercises given by MD have been beneficial and improving his LBP symptoms. Currently in no pain during subjective. when LBP is flared up he reports 4-5/10 NPS. Worst pain is 5-6/10 NPS but pt is unable to explain what causes worst pain but reports possibly most pain due sitting for long periods of time. Prednisone has improved LBP. Pt reports his goals in therapy are to improve balance although he did not mention to Dr. Sabra [name] when referred to PT for his LBP. Also wants to improve his strength. He reports he requires objects to help support him when walking such as walls or furniture. Pt denies falls, but  spouse reports a fall last fall at home where he tripped over a blanket. Has reported near falls where he needed touching of an object to correct. Pt uses a stick now and then for balance but does report difficulty transferring from sturdy to unstardy surfaces (i.e. stepping over a stream) and walking stick helps.    Currently in Pain? Yes    Pain Location Back    Pain Orientation Lower    Pain Descriptors / Indicators Tightness    Pain Onset In  the past 7 days            TREATMENT -  THEREX  Seated pball roll-outs forward/backward 12x with 3 sec holds at end range  Seated pball roll-outs side-to-side 12x with 3 sec holds at end range  Seated hamstring stretch 2x30 sec BLEs  Seated piriformis stretch 2x30 sec BLEs  STS 2x10  Seated hamstring curls with 12.5# in cable machine- 2x11-15 BLEs.  Walking lunges in // bars with BUE support - x multiple reps, cuing for upright posture to achieve hip flexor stretch   Static lunges in // bars - 2x8 BLEs  Standing heel raises - 20x. Cuing to maintain knee ext. Pt with some difficulty performing exercise/reduced AROM.  In matrix cable machine: Resisted walking forward/backward, side-to-side;  progressed from 12.5-22.5# x multiple reps of each.  Prone on plinth: Prone forearm press-up - 3x10 with 3 sec holds  Prone Ws - 10x, 5x, discontinued d/t reports of shoulder discomfort.  Prone Is - 10x.   Pt reports improvement in back sx at end of session.   Pt educated throughout in the form of demo/VC/TC to facilitate improved movement at target joints and correct muscle activation with exercises. The pt exhibits good carryover within session after cuing.      PT Education - 10/01/20 1729     Education Details exercise technique, body mechanics    Person(s) Educated Patient    Methods Explanation;Demonstration;Tactile cues;Verbal cues    Comprehension Verbalized understanding;Returned demonstration;Need further instruction              PT Short Term Goals - 09/27/20 1135       PT SHORT TERM GOAL #1   Title Patient will be independent in home exercise program to improve strength/mobility for better functional independence with ADLs.    Baseline 3/3: established HEP today. See medbridge access code; 06/07/2020 pt reports doing some of his HEP, but that he has difficulty with some of the supine exercises; 4/28: pt not yet fully indep. with HEP; 5/10: pt inconsistently performing HEP; 6/20: doing HEP 3x a week;    Time 4    Period Weeks    Status Achieved    Target Date 08/02/20               PT Long Term Goals - 09/27/20 1137       PT LONG TERM GOAL #1   Title Patient will increase FOTO score equal to or greater than 69 to demonstrate statistically significant improvement in mobility and quality of life.    Baseline 3/3: 55; 3/31 59%; 4/28: 60%; 5/9: 56%, 6/20: 63%; 7/21: 53% (impacted by pt recurrent onset of LBP after lifting heavy furniture)    Time 8    Period Weeks    Status On-going    Target Date 11/22/20      PT LONG TERM GOAL #2   Title Patient (> 10 years old) will complete five times sit to stand test in < 12 seconds indicating an increased LE  strength and improved balance.    Baseline 3/3: 15.38 m/s; 06/07/2020:  11 sec, 6/20: 13.5 sec with arms across chest; 7/21: 12.3 sec    Time 8    Period Weeks    Status Partially Met    Target Date 11/22/20      PT LONG TERM GOAL #3   Title Pt will improve hip extension AROM > 10 deg bilaterally to normalize reciprocal gait and balance.    Baseline 3/3:  R/L 4 deg/3 deg; 3/31 R/L 5 deg/3 deg; 4/28: 5 deg R, 0 deg. L; 4/9: 4-5 deg. ext B, 6/20: 8 degrees bilaterally; 7/21: L hip 8 de.g, R hip 5 deg.    Time 8    Period Weeks    Status On-going    Target Date 11/22/20      PT LONG TERM GOAL #4   Title Pt will improve Berg score > 46 to display decreased risk of falls.    Baseline 3/3: Will assess upon MD approval; 06/07/2020 52/56; 4/28: 55/56    Time 8    Period Weeks    Status Achieved      PT LONG TERM GOAL #5   Title The patient will be able to maintain at least 10 seconds of SLB on BLEs in order to decrease risk of falls when navigating obstacles, curbs and steps.    Baseline 06/07/2020: pt unable to maintain more than 3 sec of SLB without UE support; 4/28: 15 sec BLEs    Time 4    Period Weeks    Status Achieved      PT LONG TERM GOAL #6   Title Patient will increase dynamic gait index score to >20/24 as to demonstrate reduced fall risk and improved dynamic gait balance for better safety with community/home ambulation.    Baseline 4/28: 17/24; 5/9: 19/24, 6/20: 20/24; 7/21: 22/24    Time 4    Period Weeks    Status Achieved                Plan - 10/01/20 1222     Clinical Impression Statement Continued focus this session on back mobility and LE strengthening and endurance exercises to address pt's c/o low back stiffness and LE weakness. Pt tolerates majority of therex well, and had discomfort with only one exercise, prone Ws, which were changed to prone Is where pt reported no discomfort. Pt shows good activity tolerance performing multiple exercises and reported  improvement in back sx at end of session. The pt will benefit from further skilled PT to improve pain, ROM and strength and balance in order to increase ease and safety wtih all functional mobility.    Personal Factors and Comorbidities Age;Comorbidity 3+;Past/Current Experience    Comorbidities A-fib, lumbar disc disease, prostate cancer, and  history of TIA.    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    Rehab Potential Good    PT Frequency 2x / week    PT Duration 8 weeks    PT Treatment/Interventions ADLs/Self Care Home Management;Biofeedback;Aquatic Therapy;Canalith Repostioning;Cryotherapy;Electrical Stimulation;Iontophoresis 82m/ml Dexamethasone;Moist Heat;Traction;Ultrasound;Gait training;Stair training;DME Instruction;Functional mobility training;Therapeutic activities;Therapeutic exercise;Balance training;Neuromuscular re-education;Patient/family education;Manual techniques;Passive range of motion;Dry needling;Energy conservation;Vestibular;Joint Manipulations    PT Next Visit Plan hip extension stretching, balance, LE strengthening, spinal mobility; continue POC as previously indicated    PT Home Exercise Plan added quadruped knee flexion stretch and half stretch off a step today, 7/21:access code: [email or web address], no updates    Consulted and Agree with Plan of Care Patient             Patient will benefit from skilled therapeutic intervention in order to improve the following deficits and impairments:  Abnormal gait, Decreased balance, Decreased range of motion, Decreased strength, Hypomobility, Impaired sensation, Pain, Improper body mechanics, Impaired flexibility, Decreased mobility  Visit Diagnosis: Muscle weakness (generalized)  Other abnormalities of gait and mobility  Unsteadiness on  feet  Problem List There are no problems to display for this patient.  Ricard Dillon PT, DPT  10/01/2020, 5:35 PM  [place] MAIN [facility] SERVICES [address] [place], [name], [address] Phone: [phone]   Fax:  [phone]  Name: CHEZ BULNES MRN: [id number] Date of Birth: 11/16/1945

## 2020-10-04 ENCOUNTER — Ambulatory Visit: Payer: Medicare Other

## 2020-10-04 ENCOUNTER — Other Ambulatory Visit: Payer: Self-pay

## 2020-10-04 DIAGNOSIS — R2681 Unsteadiness on feet: Secondary | ICD-10-CM

## 2020-10-04 DIAGNOSIS — M545 Low back pain, unspecified: Secondary | ICD-10-CM

## 2020-10-04 DIAGNOSIS — M6281 Muscle weakness (generalized): Secondary | ICD-10-CM

## 2020-10-04 DIAGNOSIS — R2689 Other abnormalities of gait and mobility: Secondary | ICD-10-CM

## 2020-10-04 NOTE — Therapy (Signed)
[place] MAIN [facility] SERVICES [address] [place], [name], [address] Phone: [phone]   Fax:  [phone]  Physical Therapy Treatment/Physical Therapy Progress Note Visit 40  Dates of reporting period  08/27/2020   to   10/04/2020   Patient Details  Name: Nicholas [name] MRN: [id number] Date of Birth: [birth year] Referring Provider (PT): Rusty Aus, MD   Encounter Date: 10/04/2020   PT End of Session - 10/04/20 1636     Visit Number 40    Number of Visits 54    Date for PT Re-Evaluation 11/22/20    Authorization Type [employer] Medicare, BCBS supplemental    Authorization Time Period 09/09/48- 0/93/81; recert on 11/06/91; new cert for 7/16-9/67    PT Start Time 1106    PT Stop Time 1148    PT Time Calculation (min) 42 min    Equipment Utilized During Treatment Gait belt    Activity Tolerance Patient tolerated treatment well    Behavior During Therapy [facility] for tasks assessed/performed             Past Medical History:  Diagnosis Date   A-fib ([place])    Cancer ([place]) 10/25/2019   Prostate   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)    Skin cancer    TIA (transient ischemic attack)     Past Surgical History:  Procedure Laterality Date   APPENDECTOMY     COLONOSCOPY WITH PROPOFOL N/A 11/23/2019   Procedure: COLONOSCOPY WITH PROPOFOL;  Surgeon: Toledo, Benay Pike, MD;  Location: [facility] ENDOSCOPY;  Service: Gastroenterology;  Laterality: N/A;   cyber knife surgery     PROSTATE SURGERY      There were no vitals filed for this visit.   Subjective Assessment - 10/04/20 1107     Subjective Pt reports some improvement with back sx. Pt reports he has been feeling better.    Pertinent History Pt is a pleasant 75 y.o. male referred to PT for LBP which occured early February. Pt has a PMH of prostate cx, TIA, chronic a-fib, and recent hematuria. Pt has pain with t/f from STS but feels better with  walking. Pt states his LBP has been improving but his knees and hips have neem giving him more pain. Pt's LBP is a strong ache that is on the R side but denies radicular symptoms. Pt reports exercises given by MD have been beneficial and improving his LBP symptoms. Currently in no pain during subjective. when LBP is flared up he reports 4-5/10 NPS. Worst pain is 5-6/10 NPS but pt is unable to explain what causes worst pain but reports possibly most pain due sitting for long periods of time. Prednisone has improved LBP. Pt reports his goals in therapy are to improve balance although he did not mention to Dr. Sabra [name] when referred to PT for his LBP. Also wants to improve his strength. He reports he requires objects to help support him when walking such as walls or furniture. Pt denies falls, but  spouse reports a fall last fall at home where he tripped over a blanket. Has reported near falls where he needed touching of an object to correct. Pt uses a stick now and then for balance but does report difficulty transferring from sturdy to unstardy surfaces (i.e. stepping over a stream) and walking stick helps.    Currently in Pain? Yes    Pain Location Back    Pain Descriptors / Indicators Tightness  Pain Onset In the past 7 days            TREATMENT -  THEREX  Pt prone on plinth: Prone forearm press-up - 3x11-13. TC/VC for technique. Prone Ts (modified) - 10x. PT modified positioning so pt performs in pain-free range. Prone Is - 10x. VC provided for technique. Quadruped quad stretch - x multiple bouts of 15-20 sec.   Pt supine on plinth: Glute bridges 3x11. VC for technique.  SLR 1x11, 1x20. VC/TC for technique.    Neuro Re-Ed: CGA-min assist provided throughout At support surface- SLB - 4x30 sec each LE; frequent use of UE support to maintain balance. Tandem stance 4x30 sec each LE; intermittent UE support to maintain balance. Alternating taps onto 6" step - x multiple reps each LE. Slight  decrease in postural stability. Alternating cone taps - x multiple reps each LE. Knocks over cones, challenged with eccentric control. However, pt demonstrates ability to pick up cone and maintain balance, reports feels better.  Side-to-side over orange hurdle - 15x Forward/backward over orange hurdle  - x multiple reps. Greatest difficulty with stepping backward over hurdle, requiring UE support.    Pt educated throughout in the form of demo/VC/TC to facilitate improved movement at target joints and correct muscle activation with exercises. The pt exhibits good carryover within session after cuing.    Assessment: Pt with overall improvement with back sx since recertification (completed 09/27/2020), demonstrating ability to pick up cone from floor with reports of increased ease. He also reports prone push-ups improve back sx. Please refer to recert note for complete goal reassessment where pt showed progress with LE strength/power and balance. Although pt shows improvement, he is still challenged with all SLB tasks and progressions. Patient's condition has the potential to improve in response to therapy. Maximum improvement is yet to be obtained. The anticipated improvement is attainable and reasonable in a generally predictable time.  Patient reports overall improvement with back stiffness. The pt would benefit from further skilled PT to improve pain, ROM, balance and strength to increase ease and safety with all functional mobility.     PT Education - 10/04/20 1636     Education Details exercise technique, body mechanics    Person(s) Educated Patient    Methods Explanation;Demonstration;Verbal cues    Comprehension Verbalized understanding;Returned demonstration             Plan - 10/04/20 1654     Clinical Impression Statement Pt with overall improvement with back sx since recertification (completed 09/27/2020), demonstrating ability to pick up cone from floor with reports of increased  ease. He also reports prone push-ups improve back sx. Please refer to recert note for complete goal reassessment where pt showed progress with LE strength/power and balance. Although pt shows improvement, he is still challenged with all SLB tasks and progressions. ?Patient's condition has the potential to improve in response to therapy. Maximum improvement is yet to be obtained. The anticipated improvement is attainable and reasonable in a generally predictable time.  Patient reports overall improvement with back stiffness. The pt would benefit from further skilled PT to improve pain, ROM, balance and strength to increase ease and safety with all functional mobility.    Personal Factors and Comorbidities Age;Comorbidity 3+;Past/Current Experience    Comorbidities A-fib, lumbar disc disease, prostate cancer, and  history of TIA.    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  Rehab Potential Good    PT Frequency 2x / week    PT Duration 8 weeks    PT Treatment/Interventions ADLs/Self Care Home Management;Biofeedback;Aquatic Therapy;Canalith Repostioning;Cryotherapy;Electrical Stimulation;Iontophoresis 65m/ml Dexamethasone;Moist Heat;Traction;Ultrasound;Gait training;Stair training;DME Instruction;Functional mobility training;Therapeutic activities;Therapeutic exercise;Balance training;Neuromuscular re-education;Patient/family education;Manual techniques;Passive range of motion;Dry needling;Energy conservation;Vestibular;Joint Manipulations    PT Next Visit Plan hip extension stretching, balance, LE strengthening, spinal mobility; continue POC as previously indicated    PT Home Exercise Plan added quadruped knee flexion stretch and half stretch off a step today, 7/21:access code: [email or web address] no updates    Consulted and Agree  with Plan of Care Patient               PT Short Term Goals - 10/04/20 1655       PT SHORT TERM GOAL #1   Title Patient will be independent in home exercise program to improve strength/mobility for better functional independence with ADLs.    Baseline 3/3: established HEP today. See medbridge access code; 06/07/2020 pt reports doing some of his HEP, but that he has difficulty with some of the supine exercises; 4/28: pt not yet fully indep. with HEP; 5/10: pt inconsistently performing HEP; 6/20: doing HEP 3x a week;    Time 4    Period Weeks    Status Achieved    Target Date 08/02/20               PT Long Term Goals - 10/04/20 1655       PT LONG TERM GOAL #1   Title Patient will increase FOTO score equal to or greater than 69 to demonstrate statistically significant improvement in mobility and quality of life.    Baseline 3/3: 55; 3/31 59%; 4/28: 60%; 5/9: 56%, 6/20: 63%; 7/21: 53% (impacted by pt recurrent onset of LBP after lifting heavy furniture)    Time 8    Period Weeks    Status On-going    Target Date 11/22/20      PT LONG TERM GOAL #2   Title Patient (> 646years old) will complete five times sit to stand test in < 12 seconds indicating an increased LE strength and improved balance.    Baseline 3/3: 15.38 m/s; 06/07/2020:  11 sec, 6/20: 13.5 sec with arms across chest; 7/21: 12.3 sec    Time 8    Period Weeks    Status Partially Met    Target Date 11/22/20      PT LONG TERM GOAL #3   Title Pt will improve hip extension AROM > 10 deg bilaterally to normalize reciprocal gait and balance.    Baseline 3/3: R/L 4 deg/3 deg; 3/31 R/L 5 deg/3 deg; 4/28: 5 deg R, 0 deg. L; 4/9: 4-5 deg. ext B, 6/20: 8 degrees bilaterally; 7/21: L hip 8 de.g, R hip 5 deg.    Time 8    Period Weeks    Status On-going    Target Date 11/22/20      PT LONG TERM GOAL #4   Title Pt will improve Berg score > 46 to display decreased risk of falls.    Baseline 3/3: Will assess upon MD  approval; 06/07/2020 52/56; 4/28: 55/56    Time 8    Period Weeks    Status Achieved      PT LONG TERM GOAL #5   Title The patient will be able to maintain at least 10 seconds of SLB on BLEs in order to decrease risk of falls when navigating obstacles, curbs and  steps.    Baseline 06/07/2020: pt unable to maintain more than 3 sec of SLB without UE support; 4/28: 15 sec BLEs    Time 4    Period Weeks    Status Achieved      PT LONG TERM GOAL #6   Title Patient will increase dynamic gait index score to >20/24 as to demonstrate reduced fall risk and improved dynamic gait balance for better safety with community/home ambulation.    Baseline 4/28: 17/24; 5/9: 19/24, 6/20: 20/24; 7/21: 22/24    Time 4    Period Weeks    Status Achieved                   Plan - 10/04/20 1654     Clinical Impression Statement Pt with overall improvement with back sx since recertification (completed 09/27/2020), demonstrating ability to pick up cone from floor with reports of increased ease. He also reports prone push-ups improve back sx. Please refer to recert note for complete goal reassessment where pt showed progress with LE strength/power and balance. Although pt shows improvement, he is still challenged with all SLB tasks and progressions. ?Patient's condition has the potential to improve in response to therapy. Maximum improvement is yet to be obtained. The anticipated improvement is attainable and reasonable in a generally predictable time.  Patient reports overall improvement with back stiffness. The pt would benefit from further skilled PT to improve pain, ROM, balance and strength to increase ease and safety with all functional mobility.    Personal Factors and Comorbidities Age;Comorbidity 3+;Past/Current Experience    Comorbidities A-fib, lumbar disc disease, prostate cancer, and  history of TIA.    Examination-Activity Limitations Reach Overhead;Stand;Bend;Lift;Locomotion Level;Squat     Examination-Participation Restrictions Community Activity;Yard Work    Merchant [employer] officer Evolving/Moderate complexity    Rehab Potential Good    PT Frequency 2x / week    PT Duration 8 weeks    PT Treatment/Interventions ADLs/Self Care Home Management;Biofeedback;Aquatic Therapy;Canalith Repostioning;Cryotherapy;Electrical Stimulation;Iontophoresis 46m/ml Dexamethasone;Moist Heat;Traction;Ultrasound;Gait training;Stair training;DME Instruction;Functional mobility training;Therapeutic activities;Therapeutic exercise;Balance training;Neuromuscular re-education;Patient/family education;Manual techniques;Passive range of motion;Dry needling;Energy conservation;Vestibular;Joint Manipulations    PT Next Visit Plan hip extension stretching, balance, LE strengthening, spinal mobility; continue POC as previously indicated    PT Home Exercise Plan added quadruped knee flexion stretch and half stretch off a step today, 7/21:access code: [email or web address], no updates    Consulted and Agree with Plan of Care Patient             Patient will benefit from skilled therapeutic intervention in order to improve the following deficits and impairments:  Abnormal gait, Decreased balance, Decreased range of motion, Decreased strength, Hypomobility, Impaired sensation, Pain, Improper body mechanics, Impaired flexibility, Decreased mobility  Visit Diagnosis: Unsteadiness on feet  Other abnormalities of gait and mobility  Acute low back pain, unspecified back pain laterality, unspecified whether sciatica present  Muscle weakness (generalized)     Problem List There are no problems to display for this patient.  HRicard DillonPT, DPT 10/04/2020, 4:58 PM  [place] [facility] [address] [address]Phone: [phone]  Fax:  [phone] Name: Nicholas UPHOFFMRN: [id number] of Birth: 112-06-1945

## 2020-10-08 ENCOUNTER — Other Ambulatory Visit: Payer: Self-pay

## 2020-10-08 ENCOUNTER — Ambulatory Visit: Payer: Medicare Other | Attending: Internal Medicine

## 2020-10-08 DIAGNOSIS — M545 Low back pain, unspecified: Secondary | ICD-10-CM | POA: Insufficient documentation

## 2020-10-08 DIAGNOSIS — M6281 Muscle weakness (generalized): Secondary | ICD-10-CM | POA: Diagnosis present

## 2020-10-08 DIAGNOSIS — G8929 Other chronic pain: Secondary | ICD-10-CM | POA: Insufficient documentation

## 2020-10-08 DIAGNOSIS — R2689 Other abnormalities of gait and mobility: Secondary | ICD-10-CM | POA: Insufficient documentation

## 2020-10-08 DIAGNOSIS — R278 Other lack of coordination: Secondary | ICD-10-CM | POA: Insufficient documentation

## 2020-10-08 DIAGNOSIS — R2681 Unsteadiness on feet: Secondary | ICD-10-CM | POA: Insufficient documentation

## 2020-10-08 NOTE — Therapy (Signed)
[place] MAIN [facility] SERVICES [address] [place], [name], [address] Phone: [phone]   Fax:  [phone]  Physical Therapy Treatment  Patient Details  Name: Nicholas [name] MRN: [id number] Date of Birth: [birth year] Referring Provider (PT): Rusty Aus, MD   Encounter Date: 10/08/2020   PT End of Session - 10/08/20 1317     Visit Number 41    Number of Visits 109    Date for PT Re-Evaluation 11/22/20    Authorization Type [employer] Medicare, BCBS supplemental    Authorization Time Period 8/67/67- 04/18/45; recert on 0/96/28; new cert for 3/66-2/94    PT Start Time 1147    PT Stop Time 1230    PT Time Calculation (min) 43 min    Equipment Utilized During Treatment Gait belt    Activity Tolerance Patient tolerated treatment well    Behavior During Therapy [facility] for tasks assessed/performed             Past Medical History:  Diagnosis Date   A-fib ([place])    Cancer ([place]) 10/25/2019   Prostate   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    Subjective Assessment - 10/08/20 1316     Subjective Pt reports continued improvement with LBP. Pt reports he felt very unsteady yesterday. He thinks it might be due to performing several STS the day prior.    Pertinent History Pt is a pleasant 75 y.o. male referred to PT for LBP which occured early February. Pt has a PMH of prostate cx, TIA, chronic a-fib, and recent hematuria. Pt has pain with t/f from STS but feels better with walking. Pt states his LBP  has been improving but his knees and hips have neem giving him more pain. Pt's LBP is a strong ache that is on the R side but denies radicular symptoms. Pt reports exercises given by MD have been beneficial and improving his LBP symptoms. Currently in no pain during subjective. when LBP is flared up he reports 4-5/10 NPS. Worst pain is 5-6/10 NPS but pt is unable to explain what causes worst pain but reports possibly most pain due sitting for long periods of time. Prednisone has improved LBP. Pt reports his goals in therapy are to improve balance although he did not mention to Dr. Sabra [name] when referred to PT for his LBP. Also wants to improve his strength. He reports he requires objects to help support him when walking such as walls or furniture. Pt denies falls, but  spouse reports a fall last fall at home where he tripped over a blanket. Has reported near falls where he needed touching of an object to correct. Pt uses a stick now and then for balance but does report difficulty transferring from sturdy to unstardy surfaces (i.e. stepping over a stream) and walking stick helps.    Currently in Pain? No/denies    Pain Onset In the past 7 days  TREATMENT -   Neuro Re-Ed: CGA-min assist provided throughout At support surface- SLB - 4x30 sec each LE; continued frequent use of UE support to maintain balance. Tandem stance 4x30 sec each LE; intermittent UE support to maintain balance. Stepping side-to-side over orange hurdle - 15x each direction, no LOB Forward/backward over orange hurdle  - 2x15. Knocks over hurdle 2x. Continued difficulty with backward step. Airex - ankle rockers 2x15  Seated ankle rockers emphasis on DF with 5 sec holds for improved coordination/control  Standing on airex at support surface- WBOS 30 sec, no LOB NBOS 2x30 sec, infrequent use of UE support NBOS and marching onto 6" step - x multiple reps, must use intermittent UE support Alternating cone taps - x  multiple reps each LE. Less frequent use of UE support compared to prior sessions.  Basketball semi tandem and tandem stance, more reps performed with LLE as primary stance leg as this is most challenging for pt. Pt uses intermittent UE support throughout, does show within-session improvement.  Progressed basketball exercise to with one foot on floor one on 6" step   Pt educated throughout in the form of demo/VC/TC to facilitate improved movement at target joints and correct muscle activation with exercises. The pt exhibits good carryover within session after cuing.      PT Education - 10/08/20 1316     Education Details exercise technique, body mechanics with modified SLB exercises    Person(s) Educated Patient    Methods Explanation;Demonstration;Verbal cues    Comprehension Verbalized understanding;Returned demonstration              PT Short Term Goals - 10/04/20 1655       PT SHORT TERM GOAL #1   Title Patient will be independent in home exercise program to improve strength/mobility for better functional independence with ADLs.    Baseline 3/3: established HEP today. See medbridge access code; 06/07/2020 pt reports doing some of his HEP, but that he has difficulty with some of the supine exercises; 4/28: pt not yet fully indep. with HEP; 5/10: pt inconsistently performing HEP; 6/20: doing HEP 3x a week;    Time 4    Period Weeks    Status Achieved    Target Date 08/02/20               PT Long Term Goals - 10/04/20 1655       PT LONG TERM GOAL #1   Title Patient will increase FOTO score equal to or greater than 69 to demonstrate statistically significant improvement in mobility and quality of life.    Baseline 3/3: 55; 3/31 59%; 4/28: 60%; 5/9: 56%, 6/20: 63%; 7/21: 53% (impacted by pt recurrent onset of LBP after lifting heavy furniture)    Time 8    Period Weeks    Status On-going    Target Date 11/22/20      PT LONG TERM GOAL #2   Title Patient (> 80 years  old) will complete five times sit to stand test in < 12 seconds indicating an increased LE strength and improved balance.    Baseline 3/3: 15.38 m/s; 06/07/2020:  11 sec, 6/20: 13.5 sec with arms across chest; 7/21: 12.3 sec    Time 8    Period Weeks    Status Partially Met    Target Date 11/22/20      PT LONG TERM GOAL #3   Title Pt will improve hip extension AROM > 10 deg bilaterally to normalize reciprocal gait and  balance.    Baseline 3/3: R/L 4 deg/3 deg; 3/31 R/L 5 deg/3 deg; 4/28: 5 deg R, 0 deg. L; 4/9: 4-5 deg. ext B, 6/20: 8 degrees bilaterally; 7/21: L hip 8 de.g, R hip 5 deg.    Time 8    Period Weeks    Status On-going    Target Date 11/22/20      PT LONG TERM GOAL #4   Title Pt will improve Berg score > 46 to display decreased risk of falls.    Baseline 3/3: Will assess upon MD approval; 06/07/2020 52/56; 4/28: 55/56    Time 8    Period Weeks    Status Achieved      PT LONG TERM GOAL #5   Title The patient will be able to maintain at least 10 seconds of SLB on BLEs in order to decrease risk of falls when navigating obstacles, curbs and steps.    Baseline 06/07/2020: pt unable to maintain more than 3 sec of SLB without UE support; 4/28: 15 sec BLEs    Time 4    Period Weeks    Status Achieved      PT LONG TERM GOAL #6   Title Patient will increase dynamic gait index score to >20/24 as to demonstrate reduced fall risk and improved dynamic gait balance for better safety with community/home ambulation.    Baseline 4/28: 17/24; 5/9: 19/24, 6/20: 20/24; 7/21: 22/24    Time 4    Period Weeks    Status Achieved                   Plan - 10/08/20 1317     Clinical Impression Statement Pt making progress with overall improvement in back sx compared to previous session. Continued focus on balance tasks, particularly ones that challenge LLE as primary stance leg as this is most difficult for pt. Pt did show within-session improvement with semi-tandem basketball exercise  with LLE as primary stance leg after multiple reps. The pt will benefit from further skilled PT to continue to improve pain, mobility, strength and balance to increase QOL and reduce fall risk.    Personal Factors and Comorbidities Age;Comorbidity 3+;Past/Current Experience    Comorbidities A-fib, lumbar disc disease, prostate cancer, and  history of TIA.    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    Rehab Potential Good    PT Frequency 2x / week    PT Duration 8 weeks    PT Treatment/Interventions ADLs/Self Care Home Management;Biofeedback;Aquatic Therapy;Canalith Repostioning;Cryotherapy;Electrical Stimulation;Iontophoresis 7m/ml Dexamethasone;Moist Heat;Traction;Ultrasound;Gait training;Stair training;DME Instruction;Functional mobility training;Therapeutic activities;Therapeutic exercise;Balance training;Neuromuscular re-education;Patient/family education;Manual techniques;Passive range of motion;Dry needling;Energy conservation;Vestibular;Joint Manipulations    PT Next Visit Plan hip extension stretching, balance, LE strengthening, spinal mobility; continue POC as previously indicated    PT Home Exercise Plan added quadruped knee flexion stretch and half stretch off a step today, 7/21:access code: [email or web address], no updates    Consulted and Agree with Plan of Care Patient             Patient will benefit from skilled therapeutic intervention in order to improve the following deficits and impairments:  Abnormal gait, Decreased balance, Decreased range of motion, Decreased strength, Hypomobility, Impaired sensation, Pain, Improper body mechanics, Impaired flexibility, Decreased mobility  Visit Diagnosis: Unsteadiness on feet  Other lack of coordination  Muscle weakness (generalized)  Other abnormalities of  gait and mobility  Problem List There are no problems to display for this patient.  Ricard Dillon PT, DPT 10/08/2020, 1:24 PM  [place] MAIN [facility] SERVICES [address] [place], [name], [address] Phone: [phone]   Fax:  [phone]  Name: Nicholas [name] MRN: [id number] Date of Birth: 28-Oct-1945

## 2020-10-11 ENCOUNTER — Ambulatory Visit: Payer: Medicare Other

## 2020-10-11 ENCOUNTER — Other Ambulatory Visit: Payer: Self-pay

## 2020-10-11 DIAGNOSIS — M6281 Muscle weakness (generalized): Secondary | ICD-10-CM

## 2020-10-11 DIAGNOSIS — R2681 Unsteadiness on feet: Secondary | ICD-10-CM | POA: Diagnosis not present

## 2020-10-11 DIAGNOSIS — R2689 Other abnormalities of gait and mobility: Secondary | ICD-10-CM

## 2020-10-11 NOTE — Therapy (Signed)
[facility] MAIN REHAB SERVICES [address] [place], [place], [address] Phone: [phone]   Fax:  [phone]  Physical Therapy Treatment  Patient Details  Name: Nicholas [name] MRN: [id number] Date of Birth: 03/05/1946 Referring Provider (PT): Miller, Mark F, MD   Encounter Date: 10/11/2020   PT End of Session - 10/11/20 1605     Visit Number 42    Number of Visits 54    Date for PT Re-Evaluation 11/22/20    Authorization Type [employer] Medicare, BCBS supplemental    Authorization Time Period 07/05/20- 08/30/20; recert on 08/27/20; new cert for 7/21-9/15    PT Start Time 1148    PT Stop Time 1230    PT Time Calculation (min) 42 min    Equipment Utilized During Treatment Gait belt    Activity Tolerance Patient tolerated treatment well    Behavior During Therapy WFL for tasks assessed/performed             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   Subjective Assessment - 10/11/20 1603     Subjective Pt says he feels a bit less unsteadier today compared to yesterday. He reports no back pain currently.    Pertinent History Pt is a pleasant 74 y.o. male referred to PT for LBP which occured early February. Pt has a PMH of prostate cx, TIA, chronic a-fib, and recent hematuria. Pt has pain with t/f from STS but feels better with walking. Pt states his LBP has been improving but his knees and hips have neem  giving him more pain. Pt's LBP is a strong ache that is on the R side but denies radicular symptoms. Pt reports exercises given by MD have been beneficial and improving his LBP symptoms. Currently in no pain during subjective. when LBP is flared up he reports 4-5/10 NPS. Worst pain is 5-6/10 NPS but pt is unable to explain what causes worst pain but reports possibly most pain due sitting for long periods of time. Prednisone has improved LBP. Pt reports his goals in therapy are to improve balance although he did not mention to Dr. Miller when referred to PT for his LBP. Also wants to improve his strength. He reports he requires objects to help support him when walking such as walls or furniture. Pt denies falls, but  spouse reports a fall last fall at home where he tripped over a blanket. Has reported near falls where he needed touching of an object to correct. Pt uses a stick now and then for balance but does report difficulty transferring from sturdy to unstardy surfaces (i.e. stepping over a stream) and walking stick helps.    Currently in Pain? No/denies    Pain Onset In the past 7 days             TREATMENT  Therex: PT instructs pt through   LE warm-up: LAQ - 2x20 B LE Seated heel rockers for DF/PF - 20x B LE Seated, alphabet - one time through for B LE  Neuro Re-eduction: CGA-mod assist throughout   On firm surface - basketball for dynamic balance task: NBOS - 2 rounds through Semi-tandem - 2 rounds for each LE as primary stance LE  On compliant surface - basketball for dynamic balance task: WBOS - 1 round through NBOS - 1 rounds through Semi-tandem - 2 rounds with each LE as primary stance LE Tandem - 2 rounds with each LE as primary stance LE  At support surface- SLB progression - toe taps on balance pods 2x20 B LEs SLB 4x30 sec B LEs.   Pt uses intermittent UE support for all of the above balance exercises.   Pt educated throughout in the form of demo/VC/TC to facilitate  improved movement at target joints and correct muscle activation with exercises. The pt exhibits good carryover within session after cuing.        PT Education - 10/11/20 1604     Education Details exercise technique, body mechanics, importance of warm-up for LEs    Person(s) Educated Patient    Methods Demonstration;Explanation;Verbal cues    Comprehension Verbalized understanding;Returned demonstration;Need further instruction              PT Short Term Goals - 10/04/20 1655       PT SHORT TERM GOAL #1   Title Patient will be independent in home exercise program to improve strength/mobility for better functional independence with ADLs.    Baseline 3/3: established HEP today. See medbridge access code; 06/07/2020 pt reports doing some of his HEP, but that he has difficulty with some of the supine exercises; 4/28: pt not yet fully indep. with HEP; 5/10: pt inconsistently performing HEP; 6/20: doing HEP 3x a week;    Time 4    Period Weeks    Status Achieved    Target Date 08/02/20               PT Long Term Goals - 10/04/20 1655       PT LONG TERM GOAL #1   Title Patient will increase FOTO score equal to or greater than 69 to demonstrate statistically significant improvement in mobility and quality of life.    Baseline 3/3: 55; 3/31 59%; 4/28: 60%; 5/9: 56%, 6/20: 63%; 7/21: 53% (impacted by pt recurrent onset of LBP after lifting heavy furniture)    Time 8    Period Weeks    Status On-going    Target Date 11/22/20      PT LONG TERM GOAL #2   Title Patient (> 60 years old) will complete five times sit to stand test in < 12 seconds indicating an increased LE strength and improved balance.    Baseline 3/3: 15.38 m/s; 06/07/2020:  11 sec, 6/20: 13.5 sec with arms across chest; 7/21: 12.3 sec    Time 8    Period Weeks    Status Partially Met    Target Date 11/22/20      PT LONG TERM GOAL #3   Title Pt will improve hip extension AROM > 10 deg bilaterally to normalize  reciprocal gait and balance.    Baseline 3/3: R/L 4 deg/3 deg; 3/31 R/L 5 deg/3 deg; 4/28: 5 deg R, 0 deg. L; 4/9: 4-5 deg. ext B, 6/20: 8 degrees bilaterally; 7/21: L hip 8 de.g, R hip 5 deg.    Time 8      Period Weeks    Status On-going    Target Date 11/22/20      PT LONG TERM GOAL #4   Title Pt will improve Berg score > 46 to display decreased risk of falls.    Baseline 3/3: Will assess upon MD approval; 06/07/2020 52/56; 4/28: 55/56    Time 8    Period Weeks    Status Achieved      PT LONG TERM GOAL #5   Title The patient will be able to maintain at least 10 seconds of SLB on BLEs in order to decrease risk of falls when navigating obstacles, curbs and steps.    Baseline 06/07/2020: pt unable to maintain more than 3 sec of SLB without UE support; 4/28: 15 sec BLEs    Time 4    Period Weeks    Status Achieved      PT LONG TERM GOAL #6   Title Patient will increase dynamic gait index score to >20/24 as to demonstrate reduced fall risk and improved dynamic gait balance for better safety with community/home ambulation.    Baseline 4/28: 17/24; 5/9: 19/24, 6/20: 20/24; 7/21: 22/24    Time 4    Period Weeks    Status Achieved                   Plan - 10/11/20 1618     Clinical Impression Statement Pt highly motivated throughout session. PT instructs pt through LE warm-up to prep for balance exercise and explains the importance of LE warm-up. Pt verbalizes understanding. Pt at first had difficulty with all semi-tandem and tandem exercises, but improves with reps. He needs interimttent UE support throughout and CGA-mod assist to prevent LOB. He is still most challenged when LLE is primary stance LE. The pt will continue to benefit from further skilled PT to improve balance, strength, and mobility to increase QOL and reduce fall risk.    Personal Factors and Comorbidities Age;Comorbidity 3+;Past/Current Experience    Comorbidities A-fib, lumbar disc disease, prostate cancer, and   history of TIA.    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    Rehab Potential Good    PT Frequency 2x / week    PT Duration 8 weeks    PT Treatment/Interventions ADLs/Self Care Home Management;Biofeedback;Aquatic Therapy;Canalith Repostioning;Cryotherapy;Electrical Stimulation;Iontophoresis 32m/ml Dexamethasone;Moist Heat;Traction;Ultrasound;Gait training;Stair training;DME Instruction;Functional mobility training;Therapeutic activities;Therapeutic exercise;Balance training;Neuromuscular re-education;Patient/family education;Manual techniques;Passive range of motion;Dry needling;Energy conservation;Vestibular;Joint Manipulations    PT Next Visit Plan hip extension stretching, balance, LE strengthening, spinal mobility; continue POC as previously indicated    PT Home Exercise Plan added quadruped knee flexion stretch and half stretch off a step today, 7/21:access code: [email or web address], no updates    Consulted and Agree with Plan of Care Patient             Patient will benefit from skilled therapeutic intervention in order to improve the following deficits and impairments:  Abnormal gait, Decreased balance, Decreased range of motion, Decreased strength, Hypomobility, Impaired sensation, Pain, Improper body mechanics, Impaired flexibility, Decreased mobility  Visit Diagnosis: Unsteadiness on feet  Other abnormalities of gait and mobility  Muscle weakness (generalized)     Problem List There are no problems to display for this patient.  HRicard DillonPT, DPT 10/11/2020, 4:31 PM  COak ViewMAIN RGreat [facility]SERVICES [address] [place] [address]Phone: [phone]  Fax:  [phone]  Name: Nicholas [name] MRN: [id number] Date of Birth: [birth year]

## 2020-10-15 ENCOUNTER — Ambulatory Visit: Payer: Medicare Other

## 2020-10-15 ENCOUNTER — Other Ambulatory Visit: Payer: Self-pay

## 2020-10-15 DIAGNOSIS — M6281 Muscle weakness (generalized): Secondary | ICD-10-CM

## 2020-10-15 DIAGNOSIS — R2689 Other abnormalities of gait and mobility: Secondary | ICD-10-CM

## 2020-10-15 DIAGNOSIS — R2681 Unsteadiness on feet: Secondary | ICD-10-CM | POA: Diagnosis not present

## 2020-10-15 NOTE — Therapy (Signed)
[place] MAIN [facility] SERVICES [address] [place], [name], [address] Phone: [phone]   Fax:  [phone]  Physical Therapy Treatment  Patient Details  Name: Nicholas [name] MRN: [id number] Date of Birth: 01-01-1946 Referring Provider (PT): Rusty Aus, MD   Encounter Date: 10/15/2020   PT End of Session - 10/15/20 1152     Visit Number 43    Number of Visits 54    Date for PT Re-Evaluation 11/22/20    Authorization Type [employer] Medicare, BCBS supplemental    Authorization Time Period 05/22/95- 0/26/37; recert on 8/58/85; new cert for 0/27-7/41    PT Start Time 1103    PT Stop Time 1146    PT Time Calculation (min) 43 min    Equipment Utilized During Treatment Gait belt    Activity Tolerance Patient tolerated treatment well    Behavior During Therapy [facility] for tasks assessed/performed             Past Medical History:  Diagnosis Date   A-fib ([place])    Cancer ([place]) 10/25/2019   Prostate   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   Subjective Assessment - 10/15/20 1058     Subjective Pt reports his back is feeling "pretty good" and that he's been doing his stretches. Pt reports the other day he was feeling off-balance, but today is better.    Pertinent History Pt is a pleasant 75 y.o. male referred to PT for LBP which occured early February. Pt has a PMH of prostate cx, TIA, chronic a-fib, and recent hematuria. Pt has pain with t/f from STS but feels better with walking. Pt states his  LBP has been improving but his knees and hips have neem giving him more pain. Pt's LBP is a strong ache that is on the R side but denies radicular symptoms. Pt reports exercises given by MD have been beneficial and improving his LBP symptoms. Currently in no pain during subjective. when LBP is flared up he reports 4-5/10 NPS. Worst pain is 5-6/10 NPS but pt is unable to explain what causes worst pain but reports possibly most pain due sitting for long periods of time. Prednisone has improved LBP. Pt reports his goals in therapy are to improve balance although he did not mention to Dr. Sabra [name] when referred to PT for his LBP. Also wants to improve his strength. He reports he requires objects to help support him when walking such as walls or furniture. Pt denies falls, but  spouse reports a fall last fall at home where he tripped over a blanket. Has reported near falls where he needed touching of an object to correct. Pt uses a stick now and then for balance but does report difficulty transferring from sturdy to unstardy surfaces (i.e. stepping over a stream) and walking stick helps.    Currently in Pain? No/denies    Pain Onset In the past 7 days  TREATMENT   Therex: CGA throughout At support bar- Static Lunges 2x8-9 each LE STS 10x  PT instructs pt through LE warm-up: LAQ - 1x20 B LE Seated heel rockers for DF/PF - 20x B LE Seated, alphabet - one time through for B LE   Neuro Re-eduction: CGA-min assist throughout  STS with airex under feet 10x   At support surface-  SLB 4x30 sec BLEs Tandem stance 3x30 sec BLEs  On airex- basketball for dynamic balance task: NBOS -1 round through Semi-tandem - 2 rounds for each LE as primary stance LE; improvement with LLE Semi-tandem - 2 rounds with each LE as primary stance LE  Cone taps (3 cones: one in front of pt, two at sides) - x multiple reps each LE  Orange hurdle- forward/backward and lateral stepping - 15x each direction for  each. Initial difficulty with posterior control, but improves with reps.   Pt continues to use intermittent UE support for majority of the above balance exercises.    Pt educated throughout in the form of demo/VC/TC to facilitate improved movement at target joints and correct muscle activation with exercises. The pt exhibits good carryover within session after cuing.     PT Short Term Goals - 10/04/20 1655       PT SHORT TERM GOAL #1   Title Patient will be independent in home exercise program to improve strength/mobility for better functional independence with ADLs.    Baseline 3/3: established HEP today. See medbridge access code; 06/07/2020 pt reports doing some of his HEP, but that he has difficulty with some of the supine exercises; 4/28: pt not yet fully indep. with HEP; 5/10: pt inconsistently performing HEP; 6/20: doing HEP 3x a week;    Time 4    Period Weeks    Status Achieved    Target Date 08/02/20               PT Long Term Goals - 10/04/20 1655       PT LONG TERM GOAL #1   Title Patient will increase FOTO score equal to or greater than 69 to demonstrate statistically significant improvement in mobility and quality of life.    Baseline 3/3: 55; 3/31 59%; 4/28: 60%; 5/9: 56%, 6/20: 63%; 7/21: 53% (impacted by pt recurrent onset of LBP after lifting heavy furniture)    Time 8    Period Weeks    Status On-going    Target Date 11/22/20      PT LONG TERM GOAL #2   Title Patient (> 49 years old) will complete five times sit to stand test in < 12 seconds indicating an increased LE strength and improved balance.    Baseline 3/3: 15.38 m/s; 06/07/2020:  11 sec, 6/20: 13.5 sec with arms across chest; 7/21: 12.3 sec    Time 8    Period Weeks    Status Partially Met    Target Date 11/22/20      PT LONG TERM GOAL #3   Title Pt will improve hip extension AROM > 10 deg bilaterally to normalize reciprocal gait and balance.    Baseline 3/3: R/L 4 deg/3 deg; 3/31 R/L 5 deg/3  deg; 4/28: 5 deg R, 0 deg. L; 4/9: 4-5 deg. ext B, 6/20: 8 degrees bilaterally; 7/21: L hip 8 de.g, R hip 5 deg.    Time 8    Period Weeks    Status On-going    Target Date 11/22/20      PT LONG TERM  GOAL #4   Title Pt will improve Berg score > 46 to display decreased risk of falls.    Baseline 3/3: Will assess upon MD approval; 06/07/2020 52/56; 4/28: 55/56    Time 8    Period Weeks    Status Achieved      PT LONG TERM GOAL #5   Title The patient will be able to maintain at least 10 seconds of SLB on BLEs in order to decrease risk of falls when navigating obstacles, curbs and steps.    Baseline 06/07/2020: pt unable to maintain more than 3 sec of SLB without UE support; 4/28: 15 sec BLEs    Time 4    Period Weeks    Status Achieved      PT LONG TERM GOAL #6   Title Patient will increase dynamic gait index score to >20/24 as to demonstrate reduced fall risk and improved dynamic gait balance for better safety with community/home ambulation.    Baseline 4/28: 17/24; 5/9: 19/24, 6/20: 20/24; 7/21: 22/24    Time 4    Period Weeks    Status Achieved               Plan - 10/15/20 1152     Clinical Impression Statement First part of session focused on LE strengthening interventions to promote further improvement in low back sx and to improve pt's strength/stability with functional movements. Pt does fatigue quickly with therex today, but otherwise tolerates exercises well. He shows progress in modified tandem-stance with stability of LLE. The pt will benefit from further skilled PT to continue to improve strength and balance to decrease fall risk and increase QOL.    Personal Factors and Comorbidities Age;Comorbidity 3+;Past/Current Experience    Comorbidities A-fib, lumbar disc disease, prostate cancer, and  history of TIA.    Examination-Activity Limitations Reach Overhead;Stand;Bend;Lift;Locomotion Level;Squat    Examination-Participation Restrictions Community Activity;Yard Work     [employer]chant [employer] officer Evolving/Moderate complexity    Rehab Potential Good    PT Frequency 2x / week    PT Duration 8 weeks    PT Treatment/Interventions ADLs/Self Care Home Management;Biofeedback;Aquatic Therapy;Canalith Repostioning;Cryotherapy;Electrical Stimulation;Iontophoresis 88m/ml Dexamethasone;Moist Heat;Traction;Ultrasound;Gait training;Stair training;DME Instruction;Functional mobility training;Therapeutic activities;Therapeutic exercise;Balance training;Neuromuscular re-education;Patient/family education;Manual techniques;Passive range of motion;Dry needling;Energy conservation;Vestibular;Joint Manipulations    PT Next Visit Plan hip extension stretching, balance, LE strengthening, spinal mobility; continue POC as previously indicated    PT Home Exercise Plan added quadruped knee flexion stretch and half stretch off a step today, 7/21:access code: [email or web address], no updates    Consulted and Agree with Plan of Care Patient             Patient will benefit from skilled therapeutic intervention in order to improve the following deficits and impairments:  Abnormal gait, Decreased balance, Decreased range of motion, Decreased strength, Hypomobility, Impaired sensation, Pain, Improper body mechanics, Impaired flexibility, Decreased mobility  Visit Diagnosis: Unsteadiness on feet  Muscle weakness (generalized)  Other abnormalities of gait and mobility     Problem List There are no problems to display for this patient.  HRicard DillonPT, DPT 10/15/2020, 11:58 AM  [place]MAIN [facility]al[facility] CenterSERVICES [address] [address]Phone: [phone]  Fax:  [phone] Name: Nicholas CHRISTIANOMRN: [id number] of Birth: [birth year]

## 2020-10-18 ENCOUNTER — Other Ambulatory Visit: Payer: Self-pay

## 2020-10-18 ENCOUNTER — Ambulatory Visit: Payer: Medicare Other

## 2020-10-18 DIAGNOSIS — R2681 Unsteadiness on feet: Secondary | ICD-10-CM

## 2020-10-18 DIAGNOSIS — M6281 Muscle weakness (generalized): Secondary | ICD-10-CM

## 2020-10-18 DIAGNOSIS — R2689 Other abnormalities of gait and mobility: Secondary | ICD-10-CM

## 2020-10-18 NOTE — Therapy (Signed)
[place] MAIN [facility] SERVICES [address] [place], [name], [address] Phone: [phone]   Fax:  [phone]  Physical Therapy Treatment  Patient Details  Name: Nicholas [name] MRN: [id number] Date of Birth: [birth year] Referring Provider (PT): Rusty Aus, MD   Encounter Date: 10/18/2020   PT End of Session - 10/18/20 1455     Visit Number 44    Number of Visits 54    Date for PT Re-Evaluation 11/22/20    Authorization Type [employer] Medicare, BCBS supplemental    Authorization Time Period 8/88/28- 0/03/49; recert on 1/79/15; new cert for 0/56-9/79    PT Start Time 1147    PT Stop Time 1228    PT Time Calculation (min) 41 min    Equipment Utilized During Treatment Gait belt    Activity Tolerance Patient tolerated treatment well    Behavior During Therapy [facility] for tasks assessed/performed             Past Medical History:  Diagnosis Date   A-fib ([place])    Cancer ([place]) 10/25/2019   Prostate   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   Subjective Assessment - 10/18/20 1454     Subjective Pt reports no back. No changes since previous session.    Pertinent History Pt is a pleasant 75 y.o. male referred to PT for LBP which occured early February. Pt has a PMH of prostate cx, TIA, chronic a-fib, and recent hematuria. Pt has pain with t/f from STS but feels better with walking. Pt states his LBP has been improving but his knees and hips have neem giving him more pain. Pt's LBP is a strong ache  that is on the R side but denies radicular symptoms. Pt reports exercises given by MD have been beneficial and improving his LBP symptoms. Currently in no pain during subjective. when LBP is flared up he reports 4-5/10 NPS. Worst pain is 5-6/10 NPS but pt is unable to explain what causes worst pain but reports possibly most pain due sitting for long periods of time. Prednisone has improved LBP. Pt reports his goals in therapy are to improve balance although he did not mention to Dr. Sabra [name] when referred to PT for his LBP. Also wants to improve his strength. He reports he requires objects to help support him when walking such as walls or furniture. Pt denies falls, but  spouse reports a fall last fall at home where he tripped over a blanket. Has reported near falls where he needed touching of an object to correct. Pt uses a stick now and then for balance but does report difficulty transferring from sturdy to unstardy surfaces (i.e. stepping over a stream) and walking stick helps.    Currently in Pain? No/denies    Pain Onset In the past 7 days             TREATMENT   Therex: Pt performs the following with 3# AW on BLEs  PT instructs  pt through LE warm-up- LAQ - 1x22 BLEs Marches 32x BLEs Seated heel rockers for DF/PF - 22x BLEs Seated, alphabet - one time through for BLEs   Neuro Re-eduction: CGA-min mod assist throughout  At support surface- On airex: WBOS EC - x multiple bouts of 30 sec as this is very challenging for pt Heel rocks back and forth - pt with difficulty with heel raise portion.  Marches on airex pad decreased use of intermittent UE support.  WBOS with trunk twists - 10-12x each side  Stepping over hurdle forward/backward - 2x16. Increased use of intermittent UE support. Still requires additional steps to steady self with posterior stepping.  Two cones placed in front of pt, pt performs cone taps - x multiple reps. Kicks over cone 2x. Improved postural stability  overall.  SLB at support surface 4x30 sec for BLEs. Very challenging for pt. Frequent use of UE support.  Tandem stance at support surface - 4x30-50 sec BLEs. Improved postural stability, doesn't require as frequent use of UE support.  WBOS, standing on airex, while throwing velcro balls at dart board - 2 rounds through. 3x posterior LOB, where pt uses UE support and PT provides mod assist for pt to regain balance.    PT provides education primarily in the form of VC/Demo to facilitate movement at target joints and correct muscle activation with all exercises performed. Pt shows good carryover after cuing     PT Education - 10/18/20 1454     Education Details exercise technique, body mechanics    Person(s) Educated Patient    Methods Explanation;Demonstration;Verbal cues    Comprehension Returned demonstration;Verbalized understanding              PT Short Term Goals - 10/04/20 1655       PT SHORT TERM GOAL #1   Title Patient will be independent in home exercise program to improve strength/mobility for better functional independence with ADLs.    Baseline 3/3: established HEP today. See medbridge access code; 06/07/2020 pt reports doing some of his HEP, but that he has difficulty with some of the supine exercises; 4/28: pt not yet fully indep. with HEP; 5/10: pt inconsistently performing HEP; 6/20: doing HEP 3x a week;    Time 4    Period Weeks    Status Achieved    Target Date 08/02/20               PT Long Term Goals - 10/04/20 1655       PT LONG TERM GOAL #1   Title Patient will increase FOTO score equal to or greater than 69 to demonstrate statistically significant improvement in mobility and quality of life.    Baseline 3/3: 55; 3/31 59%; 4/28: 60%; 5/9: 56%, 6/20: 63%; 7/21: 53% (impacted by pt recurrent onset of LBP after lifting heavy furniture)    Time 8    Period Weeks    Status On-going    Target Date 11/22/20      PT LONG TERM GOAL #2   Title Patient  (> 41 years old) will complete five times sit to stand test in < 12 seconds indicating an increased LE strength and improved balance.    Baseline 3/3: 15.38 m/s; 06/07/2020:  11 sec, 6/20: 13.5 sec with arms across chest; 7/21: 12.3 sec    Time 8    Period Weeks    Status Partially Met    Target Date 11/22/20      PT LONG TERM GOAL #3  Title Pt will improve hip extension AROM > 10 deg bilaterally to normalize reciprocal gait and balance.    Baseline 3/3: R/L 4 deg/3 deg; 3/31 R/L 5 deg/3 deg; 4/28: 5 deg R, 0 deg. L; 4/9: 4-5 deg. ext B, 6/20: 8 degrees bilaterally; 7/21: L hip 8 de.g, R hip 5 deg.    Time 8    Period Weeks    Status On-going    Target Date 11/22/20      PT LONG TERM GOAL #4   Title Pt will improve Berg score > 46 to display decreased risk of falls.    Baseline 3/3: Will assess upon MD approval; 06/07/2020 52/56; 4/28: 55/56    Time 8    Period Weeks    Status Achieved      PT LONG TERM GOAL #5   Title The patient will be able to maintain at least 10 seconds of SLB on BLEs in order to decrease risk of falls when navigating obstacles, curbs and steps.    Baseline 06/07/2020: pt unable to maintain more than 3 sec of SLB without UE support; 4/28: 15 sec BLEs    Time 4    Period Weeks    Status Achieved      PT LONG TERM GOAL #6   Title Patient will increase dynamic gait index score to >20/24 as to demonstrate reduced fall risk and improved dynamic gait balance for better safety with community/home ambulation.    Baseline 4/28: 17/24; 5/9: 19/24, 6/20: 20/24; 7/21: 22/24    Time 4    Period Weeks    Status Achieved                   Plan - 10/18/20 1456     Clinical Impression Statement Pt motivated throughout session. Pt requires at least intermittent UE support with all balance tasks. Pt requires up to mod assist with EC on compliant surface in order to maintain balance. The pt will continue to benefit from further skliled PT to improve strength and  balance to decrease fall risk.    Personal Factors and Comorbidities Age;Comorbidity 3+;Past/Current Experience    Comorbidities A-fib, lumbar disc disease, prostate cancer, and  history of TIA.    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    Rehab Potential Good    PT Frequency 2x / week    PT Duration 8 weeks    PT Treatment/Interventions ADLs/Self Care Home Management;Biofeedback;Aquatic Therapy;Canalith Repostioning;Cryotherapy;Electrical Stimulation;Iontophoresis 39m/ml Dexamethasone;Moist Heat;Traction;Ultrasound;Gait training;Stair training;DME Instruction;Functional mobility training;Therapeutic activities;Therapeutic exercise;Balance training;Neuromuscular re-education;Patient/family education;Manual techniques;Passive range of motion;Dry needling;Energy conservation;Vestibular;Joint Manipulations    PT Next Visit Plan hip extension stretching, balance, LE strengthening, spinal mobility; continue POC as previously indicated    PT Home Exercise Plan added quadruped knee flexion stretch and half stretch off a step today, 7/21:access code: [email or web address], no updates    Consulted and Agree with Plan of Care Patient             Patient will benefit from skilled therapeutic intervention in order to improve the following deficits and impairments:  Abnormal gait, Decreased balance, Decreased range of motion, Decreased strength, Hypomobility, Impaired sensation, Pain, Improper body mechanics, Impaired flexibility, Decreased mobility  Visit Diagnosis: Unsteadiness on feet  Other abnormalities of gait and mobility  Muscle weakness (generalized)     Problem List There are no problems to display for this patient.  HRicard DillonPT, DPT  10/18/2020, 3:03 PM  [place] MAIN [facility]  SERVICES [address] [place], [name], [address] Phone: [phone]   Fax:  [phone]  Name: MAVEN VARELAS MRN: [id number] Date of Birth: 1945-09-30

## 2020-10-22 ENCOUNTER — Other Ambulatory Visit: Payer: Self-pay

## 2020-10-22 ENCOUNTER — Ambulatory Visit: Payer: Medicare Other

## 2020-10-22 DIAGNOSIS — R2681 Unsteadiness on feet: Secondary | ICD-10-CM | POA: Diagnosis not present

## 2020-10-22 DIAGNOSIS — M6281 Muscle weakness (generalized): Secondary | ICD-10-CM

## 2020-10-22 DIAGNOSIS — R2689 Other abnormalities of gait and mobility: Secondary | ICD-10-CM

## 2020-10-22 DIAGNOSIS — R278 Other lack of coordination: Secondary | ICD-10-CM

## 2020-10-23 NOTE — Therapy (Signed)
[place] MAIN [facility] SERVICES [address] [place], [name], [address] Phone: [phone]   Fax:  [phone]  Physical Therapy Treatment  Patient Details  Name: Nicholas [name] MRN: [id number] Date of Birth: May 28, 1945 Referring Provider (PT): Rusty Aus, MD   Encounter Date: 10/22/2020   PT End of Session - 10/23/20 1451     Visit Number 45    Number of Visits 54    Date for PT Re-Evaluation 11/22/20    Authorization Type [employer] Medicare, BCBS supplemental    Authorization Time Period 8/67/61- 9/50/93; recert on 2/67/12; new cert for 4/58-0/99    PT Start Time 1146    PT Stop Time 1230    PT Time Calculation (min) 44 min    Equipment Utilized During Treatment Gait belt    Activity Tolerance Patient tolerated treatment well    Behavior During Therapy [facility] for tasks assessed/performed             Past Medical History:  Diagnosis Date   A-fib ([place])    Cancer ([place]) 10/25/2019   Prostate   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   Subjective Assessment - 10/23/20 1450     Subjective Pt reports continued improvement in LBP. Reports he has been doing his HEP.    Pertinent History Pt is a pleasant 75 y.o. male referred to PT for LBP which occured early February. Pt has a PMH of prostate cx, TIA, chronic a-fib, and recent hematuria. Pt has pain with t/f from STS but feels better with walking. Pt states his LBP has been improving but his knees and hips have neem giving him more pain. Pt's  LBP is a strong ache that is on the R side but denies radicular symptoms. Pt reports exercises given by MD have been beneficial and improving his LBP symptoms. Currently in no pain during subjective. when LBP is flared up he reports 4-5/10 NPS. Worst pain is 5-6/10 NPS but pt is unable to explain what causes worst pain but reports possibly most pain due sitting for long periods of time. Prednisone has improved LBP. Pt reports his goals in therapy are to improve balance although he did not mention to Dr. Sabra [name] when referred to PT for his LBP. Also wants to improve his strength. He reports he requires objects to help support him when walking such as walls or furniture. Pt denies falls, but  spouse reports a fall last fall at home where he tripped over a blanket. Has reported near falls where he needed touching of an object to correct. Pt uses a stick now and then for balance but does report difficulty transferring from sturdy to unstardy surfaces (i.e. stepping over a stream) and walking stick helps.    Currently in Pain? No/denies    Pain Onset In the past 7 days            TREATMEN   Therex:   Pt performs the following with 4# AWs  on BLEs  LAQ - 20x BLEs Standing Marches 20x BLEs Seated heel rockers for DF/PF - 15x BLEs STS 12x,10x - no loss of stability    Neuro Re-education: CGA-min mod assist throughout   At support surface- Stepping over hurdle forward/backward - 20x. Pt continues to use step strategy to maintain balance when stepping backwards over hurdle. Pt rates easy. Uses intermittent UE support   One cone placed in front and two placed laterally; Pt performs cone taps - x multiple reps. Knocks over cones. Difficulty with cone taps to L side    SLB at support surface - 4x30 sec BLEs with regular slip-on shoes, and new slip-on shoes.   Pt standing with one foot on airex and one foot on dynadisc - 2x30 sec each LE.  Then progressed to pt performing with dual task: naming animals,  and names. Progressed to pt performing with vertical and horizontal head-turns x multiple reps.     PT provides education primarily in the form of VC/Demo to facilitate movement at target joints and correct muscle activation with all exercises performed. Pt shows good carryover after cuing        PT Education - 10/23/20 1451     Education Details exercise technique, body mechanics    Person(s) Educated Patient    Methods Explanation;Demonstration;Verbal cues    Comprehension Verbalized understanding;Returned demonstration              PT Short Term Goals - 10/04/20 1655       PT SHORT TERM GOAL #1   Title Patient will be independent in home exercise program to improve strength/mobility for better functional independence with ADLs.    Baseline 3/3: established HEP today. See medbridge access code; 06/07/2020 pt reports doing some of his HEP, but that he has difficulty with some of the supine exercises; 4/28: pt not yet fully indep. with HEP; 5/10: pt inconsistently performing HEP; 6/20: doing HEP 3x a week;    Time 4    Period Weeks    Status Achieved    Target Date 08/02/20               PT Long Term Goals - 10/04/20 1655       PT LONG TERM GOAL #1   Title Patient will increase FOTO score equal to or greater than 69 to demonstrate statistically significant improvement in mobility and quality of life.    Baseline 3/3: 55; 3/31 59%; 4/28: 60%; 5/9: 56%, 6/20: 63%; 7/21: 53% (impacted by pt recurrent onset of LBP after lifting heavy furniture)    Time 8    Period Weeks    Status On-going    Target Date 11/22/20      PT LONG TERM GOAL #2   Title Patient (> 7 years old) will complete five times sit to stand test in < 12 seconds indicating an increased LE strength and improved balance.    Baseline 3/3: 15.38 m/s; 06/07/2020:  11 sec, 6/20: 13.5 sec with arms across chest; 7/21: 12.3 sec    Time 8    Period Weeks    Status Partially Met    Target Date 11/22/20       PT LONG TERM GOAL #3   Title Pt will improve hip extension AROM > 10 deg bilaterally to normalize reciprocal gait and balance.    Baseline 3/3: R/L 4 deg/3 deg; 3/31 R/L 5 deg/3 deg; 4/28: 5 deg R, 0 deg. L; 4/9: 4-5 deg. ext B, 6/20: 8  degrees bilaterally; 7/21: L hip 8 de.g, R hip 5 deg.    Time 8    Period Weeks    Status On-going    Target Date 11/22/20      PT LONG TERM GOAL #4   Title Pt will improve Berg score > 46 to display decreased risk of falls.    Baseline 3/3: Will assess upon MD approval; 06/07/2020 52/56; 4/28: 55/56    Time 8    Period Weeks    Status Achieved      PT LONG TERM GOAL #5   Title The patient will be able to maintain at least 10 seconds of SLB on BLEs in order to decrease risk of falls when navigating obstacles, curbs and steps.    Baseline 06/07/2020: pt unable to maintain more than 3 sec of SLB without UE support; 4/28: 15 sec BLEs    Time 4    Period Weeks    Status Achieved      PT LONG TERM GOAL #6   Title Patient will increase dynamic gait index score to >20/24 as to demonstrate reduced fall risk and improved dynamic gait balance for better safety with community/home ambulation.    Baseline 4/28: 17/24; 5/9: 19/24, 6/20: 20/24; 7/21: 22/24    Time 4    Period Weeks    Status Achieved                   Plan - 10/23/20 1504     Clinical Impression Statement Pt continues to require intermittent UE support with SLB exercises and exercises performed on compliant surfaces. He was able to progress to performing compliant surface exercises with a dual-task, however, and did not exhibit a significant decrease in postural stability. The pt will benefit from further skilled PT to continue to improve strength and balance in order to decrease fall risk and increase QOL.    Personal Factors and Comorbidities Age;Comorbidity 3+;Past/Current Experience    Comorbidities A-fib, lumbar disc disease, prostate cancer, and  history of TIA.     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    Rehab Potential Good    PT Frequency 2x / week    PT Duration 8 weeks    PT Treatment/Interventions ADLs/Self Care Home Management;Biofeedback;Aquatic Therapy;Canalith Repostioning;Cryotherapy;Electrical Stimulation;Iontophoresis 11m/ml Dexamethasone;Moist Heat;Traction;Ultrasound;Gait training;Stair training;DME Instruction;Functional mobility training;Therapeutic activities;Therapeutic exercise;Balance training;Neuromuscular re-education;Patient/family education;Manual techniques;Passive range of motion;Dry needling;Energy conservation;Vestibular;Joint Manipulations    PT Next Visit Plan hip extension stretching, balance, LE strengthening, spinal mobility; continue POC as previously indicated    PT Home Exercise Plan added quadruped knee flexion stretch and half stretch off a step today, 7/21:access code: [email or web address], no updates    Consulted and Agree with Plan of Care Patient             Patient will benefit from skilled therapeutic intervention in order to improve the following deficits and impairments:  Abnormal gait, Decreased balance, Decreased range of motion, Decreased strength, Hypomobility, Impaired sensation, Pain, Improper body mechanics, Impaired flexibility, Decreased mobility  Visit Diagnosis: Unsteadiness on feet  Other abnormalities of gait and mobility  Muscle weakness (generalized)  Other lack of coordination     Problem List There are no problems to display for this patient.  HRicard DillonPT, DPT 10/23/2020, 3:08 PM  [place] [facility] [address] [place] [address]Phone: [phone]  Fax:  [phone]  Name: Nicholas [name] MRN: [id number] Date of Birth:  1945/05/19

## 2020-10-25 ENCOUNTER — Other Ambulatory Visit: Payer: Self-pay

## 2020-10-25 ENCOUNTER — Ambulatory Visit: Payer: Medicare Other

## 2020-10-25 DIAGNOSIS — R2681 Unsteadiness on feet: Secondary | ICD-10-CM

## 2020-10-25 DIAGNOSIS — R2689 Other abnormalities of gait and mobility: Secondary | ICD-10-CM

## 2020-10-25 DIAGNOSIS — M6281 Muscle weakness (generalized): Secondary | ICD-10-CM

## 2020-10-25 NOTE — Therapy (Signed)
[place] MAIN [facility] SERVICES [address] [place], [name], [address] Phone: [phone]   Fax:  [phone]  Physical Therapy Treatment  Patient Details  Name: Nicholas [name] MRN: [id number] Date of Birth: 09-29-1945 Referring Provider (PT): Rusty Aus, MD   Encounter Date: 10/25/2020   PT End of Session - 10/25/20 1207     Visit Number 46    Number of Visits 54    Date for PT Re-Evaluation 11/22/20    Authorization Type [employer] Medicare, BCBS supplemental    Authorization Time Period 07/11/86- 11/05/98; recert on 3/49/17; new cert for 9/15-0/56    Progress Note Due on Visit 50    PT Start Time 1159    PT Stop Time 1225    PT Time Calculation (min) 26 min    Activity Tolerance Patient tolerated treatment well;No increased pain    Behavior During Therapy [facility] for tasks assessed/performed             Past Medical History:  Diagnosis Date   A-fib ([place])    Cancer ([place]) 10/25/2019   Prostate   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   Subjective Assessment - 10/25/20 1206     Subjective No updates today. Reports some new HEP in bed is helping his back more.    Pertinent History Pt is a pleasant 75 y.o. male referred to PT for LBP which occured early February. Pt has a PMH of prostate cx, TIA, chronic a-fib, and recent hematuria. Pt has pain with t/f from STS but feels better with walking. Pt states his LBP has been improving but his knees and hips have neem giving him more pain. Pt's  LBP is a strong ache that is on the R side but denies radicular symptoms. Pt reports exercises given by MD have been beneficial and improving his LBP symptoms. Currently in no pain during subjective. when LBP is flared up he reports 4-5/10 NPS. Worst pain is 5-6/10 NPS but pt is unable to explain what causes worst pain but reports possibly most pain due sitting for long periods of time. Prednisone has improved LBP. Pt reports his goals in therapy are to improve balance although he did not mention to Dr. Sabra [name] when referred to PT for his LBP. Also wants to improve his strength. He reports he requires objects to help support him when walking such as walls or furniture. Pt denies falls, but  spouse reports a fall last fall at home where he tripped over a blanket. Has reported near falls where he needed touching of an object to correct. Pt uses a stick now and then for balance but does report difficulty transferring from sturdy to unstardy surfaces (i.e. stepping over a stream) and walking stick helps.    Currently in Pain? No/denies             INTERVENTION THIS DATE:  -seated LAQ x15, 5lbAW bilat -seated marching x30, 5lb AW bilat  -STS from standard  height 1x11 hands free, good posturing of trunk over feet ,but requires a high velocity thrust technique -seated LAQ x15x3secH, 10lbAW bilat -STS from elevated surface, log red TB resistance at axillary level fixed to posterior anchor 1x11 (1 LOB at #8) -Agility ladder drills with variable force vector cable resistance with emphasis on fwd and retro step-to motor control.  17.5lb is too provocative with step error >75% of time and intermittent modA, but more appropriate at 12.5lb       PT Education - 10/25/20 1207     Education Details technique modifiacation for improved motor activation    Person(s) Educated Patient    Methods Explanation;Demonstration    Comprehension Verbalized understanding;Returned demonstration              PT  Short Term Goals - 10/04/20 1655       PT SHORT TERM GOAL #1   Title Patient will be independent in home exercise program to improve strength/mobility for better functional independence with ADLs.    Baseline 3/3: established HEP today. See medbridge access code; 06/07/2020 pt reports doing some of his HEP, but that he has difficulty with some of the supine exercises; 4/28: pt not yet fully indep. with HEP; 5/10: pt inconsistently performing HEP; 6/20: doing HEP 3x a week;    Time 4    Period Weeks    Status Achieved    Target Date 08/02/20               PT Long Term Goals - 10/04/20 1655       PT LONG TERM GOAL #1   Title Patient will increase FOTO score equal to or greater than 69 to demonstrate statistically significant improvement in mobility and quality of life.    Baseline 3/3: 55; 3/31 59%; 4/28: 60%; 5/9: 56%, 6/20: 63%; 7/21: 53% (impacted by pt recurrent onset of LBP after lifting heavy furniture)    Time 8    Period Weeks    Status On-going    Target Date 11/22/20      PT LONG TERM GOAL #2   Title Patient (> 33 years old) will complete five times sit to stand test in < 12 seconds indicating an increased LE strength and improved balance.    Baseline 3/3: 15.38 m/s; 06/07/2020:  11 sec, 6/20: 13.5 sec with arms across chest; 7/21: 12.3 sec    Time 8    Period Weeks    Status Partially Met    Target Date 11/22/20      PT LONG TERM GOAL #3   Title Pt will improve hip extension AROM > 10 deg bilaterally to normalize reciprocal gait and balance.    Baseline 3/3: R/L 4 deg/3 deg; 3/31 R/L 5 deg/3 deg; 4/28: 5 deg R, 0 deg. L; 4/9: 4-5 deg. ext B, 6/20: 8 degrees bilaterally; 7/21: L hip 8 de.g, R hip 5 deg.    Time 8    Period Weeks    Status On-going    Target Date 11/22/20      PT LONG TERM GOAL #4   Title Pt will improve Berg score > 46 to display decreased risk of falls.    Baseline 3/3: Will assess upon MD approval; 06/07/2020 52/56; 4/28: 55/56    Time 8     Period Weeks    Status Achieved      PT LONG TERM GOAL #5   Title The patient will be able to maintain at least 10 seconds of SLB  on BLEs in order to decrease risk of falls when navigating obstacles, curbs and steps.    Baseline 06/07/2020: pt unable to maintain more than 3 sec of SLB without UE support; 4/28: 15 sec BLEs    Time 4    Period Weeks    Status Achieved      PT LONG TERM GOAL #6   Title Patient will increase dynamic gait index score to >20/24 as to demonstrate reduced fall risk and improved dynamic gait balance for better safety with community/home ambulation.    Baseline 4/28: 17/24; 5/9: 19/24, 6/20: 20/24; 7/21: 22/24    Time 4    Period Weeks    Status Achieved                   Plan - 10/25/20 1208     Clinical Impression Statement Continued with strengthening and AMB motor control training. Pt able to advance level of resistance this date, but needs cues to optimize isometric control and temporal componenets of activation. In step training, pt has significant difficulty with torsional perturbation at the pelvis, but can overcome with concerned effort and additional time.    Personal Factors and Comorbidities Age;Comorbidity 3+;Past/Current Experience    Comorbidities A-fib, lumbar disc disease, prostate cancer, and  history of TIA.    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    Clinical Decision Making Moderate    Rehab Potential Good    PT Frequency 2x / week    PT Duration 8 weeks    PT Treatment/Interventions ADLs/Self Care Home Management;Biofeedback;Aquatic Therapy;Canalith Repostioning;Cryotherapy;Electrical Stimulation;Iontophoresis 59m/ml Dexamethasone;Moist Heat;Traction;Ultrasound;Gait training;Stair training;DME Instruction;Functional mobility training;Therapeutic  activities;Therapeutic exercise;Balance training;Neuromuscular re-education;Patient/family education;Manual techniques;Passive range of motion;Dry needling;Energy conservation;Vestibular;Joint Manipulations    PT Next Visit Plan hip extension stretching, balance, LE strengthening, spinal mobility; continue POC as previously indicated    PT Home Exercise Plan access code: [email or web address] added quadruped knee flexion stretch and half stretch off a step 7/21    Consulted and Agree with Plan of Care Patient             Patient will benefit from skilled therapeutic intervention in order to improve the following deficits and impairments:  Abnormal gait, Decreased balance, Decreased range of motion, Decreased strength, Hypomobility, Impaired sensation, Pain, Improper body mechanics, Impaired flexibility, Decreased mobility  Visit Diagnosis: Unsteadiness on feet  Other abnormalities of gait and mobility  Muscle weakness (generalized)     Problem List There are no problems to display for this patient.  12:49 PM, 10/25/20 AEtta Grandchild PT, DPT Physical Therapist - [facility]- [phone]    BEtta Grandchild8/18/2022, 12:35 PM  [place] [facility] [address] [address]Phone: [phone]  Fax:  [phone] Name: Nicholas MCQUITTYMRN: [id number] of Birth: 1April 15, 1947

## 2020-10-29 ENCOUNTER — Other Ambulatory Visit: Payer: Self-pay

## 2020-10-29 ENCOUNTER — Ambulatory Visit: Payer: Medicare Other

## 2020-10-29 DIAGNOSIS — M6281 Muscle weakness (generalized): Secondary | ICD-10-CM

## 2020-10-29 DIAGNOSIS — R2689 Other abnormalities of gait and mobility: Secondary | ICD-10-CM

## 2020-10-29 DIAGNOSIS — R2681 Unsteadiness on feet: Secondary | ICD-10-CM | POA: Diagnosis not present

## 2020-10-29 DIAGNOSIS — R278 Other lack of coordination: Secondary | ICD-10-CM

## 2020-10-29 DIAGNOSIS — G8929 Other chronic pain: Secondary | ICD-10-CM

## 2020-10-29 DIAGNOSIS — M545 Low back pain, unspecified: Secondary | ICD-10-CM

## 2020-10-29 NOTE — Therapy (Signed)
[place] MAIN [facility] SERVICES [address] [place], [name], [address] Phone: [phone]   Fax:  [phone]  Physical Therapy Treatment  Patient Details  Name: Nicholas [name] MRN: [id number] Date of Birth: 11-Oct-1945 Referring Provider (PT): Rusty Aus, MD   Encounter Date: 10/29/2020   PT End of Session - 10/29/20 1206     Visit Number 25    Number of Visits 54    Date for PT Re-Evaluation 11/22/20    Authorization Type [employer] Medicare, BCBS supplemental    Authorization Time Period 11/06/91- 09/23/94; recert on 7/89/38; new cert for 1/01-7/51    Progress Note Due on Visit 50    PT Start Time 1146    PT Stop Time 1224    PT Time Calculation (min) 38 min    Equipment Utilized During Treatment Gait belt    Activity Tolerance Patient tolerated treatment well;No increased pain    Behavior During Therapy [facility] for tasks assessed/performed             Past Medical History:  Diagnosis Date   A-fib ([place])    Cancer ([place]) 10/25/2019   Prostate   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   Subjective Assessment - 10/29/20 1148     Subjective No updates today. Reports some new HEP in bed is helping his back more. He walked more last week than he has in along time.    Pertinent History Pt is a pleasant 75 y.o. male referred to PT for LBP which occured early February. Pt has a PMH of prostate cx, TIA, chronic a-fib, and recent hematuria. Pt has pain with t/f from STS but feels better with  walking. Pt states his LBP has been improving but his knees and hips have neem giving him more pain. Pt's LBP is a strong ache that is on the R side but denies radicular symptoms. Pt reports exercises given by MD have been beneficial and improving his LBP symptoms. Currently in no pain during subjective. when LBP is flared up he reports 4-5/10 NPS. Worst pain is 5-6/10 NPS but pt is unable to explain what causes worst pain but reports possibly most pain due sitting for long periods of time. Prednisone has improved LBP. Pt reports his goals in therapy are to improve balance although he did not mention to Dr. Sabra [name] when referred to PT for his LBP. Also wants to improve his strength. He reports he requires objects to help support him when walking such as walls or furniture. Pt denies falls, but  spouse reports a fall last fall at home where he tripped over a blanket. Has reported near falls where he needed touching of an object to correct. Pt uses a stick now and then for balance but does report difficulty transferring from sturdy to unstardy surfaces (i.e. stepping over a stream) and walking stick helps.    Currently in Pain? No/denies  INTERVENTION THIS DATE:   10MWY: 0.45ms self  10MWT: 1.316m fastest (1 STUMBLE)   -Cable resisted wlaking 12.5lb resistance 2 laps each direciton  -FWD step up, step down + 180degree turn (3" step) X12 -Later 6" step-up, step-down 8x, then break due to progress weakness, loss of control  rest interval  -Later 6" step-up, step-down 10x,         PT Short Term Goals - 10/04/20 1655       PT SHORT TERM GOAL #1   Title Patient will be independent in home exercise program to improve strength/mobility for better functional independence with ADLs.    Baseline 3/3: established HEP today. See medbridge access code; 06/07/2020 pt reports doing some of his HEP, but that he has difficulty with some of the supine exercises; 4/28: pt not yet fully indep.  with HEP; 5/10: pt inconsistently performing HEP; 6/20: doing HEP 3x a week;    Time 4    Period Weeks    Status Achieved    Target Date 08/02/20               PT Long Term Goals - 10/04/20 1655       PT LONG TERM GOAL #1   Title Patient will increase FOTO score equal to or greater than 69 to demonstrate statistically significant improvement in mobility and quality of life.    Baseline 3/3: 55; 3/31 59%; 4/28: 60%; 5/9: 56%, 6/20: 63%; 7/21: 53% (impacted by pt recurrent onset of LBP after lifting heavy furniture)    Time 8    Period Weeks    Status On-going    Target Date 11/22/20      PT LONG TERM GOAL #2   Title Patient (> 6060ears old) will complete five times sit to stand test in < 12 seconds indicating an increased LE strength and improved balance.    Baseline 3/3: 15.38 m/s; 06/07/2020:  11 sec, 6/20: 13.5 sec with arms across chest; 7/21: 12.3 sec    Time 8    Period Weeks    Status Partially Met    Target Date 11/22/20      PT LONG TERM GOAL #3   Title Pt will improve hip extension AROM > 10 deg bilaterally to normalize reciprocal gait and balance.    Baseline 3/3: R/L 4 deg/3 deg; 3/31 R/L 5 deg/3 deg; 4/28: 5 deg R, 0 deg. L; 4/9: 4-5 deg. ext B, 6/20: 8 degrees bilaterally; 7/21: L hip 8 de.g, R hip 5 deg.    Time 8    Period Weeks    Status On-going    Target Date 11/22/20      PT LONG TERM GOAL #4   Title Pt will improve Berg score > 46 to display decreased risk of falls.    Baseline 3/3: Will assess upon MD approval; 06/07/2020 52/56; 4/28: 55/56    Time 8    Period Weeks    Status Achieved      PT LONG TERM GOAL #5   Title The patient will be able to maintain at least 10 seconds of SLB on BLEs in order to decrease risk of falls when navigating obstacles, curbs and steps.    Baseline 06/07/2020: pt unable to maintain more than 3 sec of SLB without UE support; 4/28: 15 sec BLEs    Time 4    Period Weeks    Status Achieved      PT LONG TERM GOAL #6    Title  Patient will increase dynamic gait index score to >20/24 as to demonstrate reduced fall risk and improved dynamic gait balance for better safety with community/home ambulation.    Baseline 4/28: 17/24; 5/9: 19/24, 6/20: 20/24; 7/21: 22/24    Time 4    Period Weeks    Status Achieved                   Plan - 10/29/20 1207     Clinical Impression Statement Continued with current plan of care as laid out in evaluation and recent prior sessions. Pt remains motivated to advance progress toward goals in order to maximize independence and safety at home. Pt requires high level assistance and cuing for completion of exercises in order to provide adequate level of stimulation and perturbation. Author allows pt as much opportunity as possible to perform independent righting strategies, only stepping in when pt is unable to prevent falling to floor. Pt continues to demonstrate progress toward goals AEB progression of some interventions this date either in volume or intensity. Based on discussion of LT goals with patient, appears that patient could be ready to for DC in 1-2 visits. Pt denies any significant inability to take part in any meaningful activities based on any pain or imbalance issues. Pt will need updates on HEP and encouragement for resuming activity at DC.    Personal Factors and Comorbidities Age;Comorbidity 3+;Past/Current Experience    Comorbidities A-fib, lumbar disc disease, prostate cancer, and  history of TIA.    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    Clinical Decision Making Moderate    Rehab Potential Good    PT Frequency 2x / week    PT Duration 8 weeks    PT Treatment/Interventions ADLs/Self Care Home Management;Biofeedback;Aquatic Therapy;Canalith Repostioning;Cryotherapy;Electrical  Stimulation;Iontophoresis 35m/ml Dexamethasone;Moist Heat;Traction;Ultrasound;Gait training;Stair training;DME Instruction;Functional mobility training;Therapeutic activities;Therapeutic exercise;Balance training;Neuromuscular re-education;Patient/family education;Manual techniques;Passive range of motion;Dry needling;Energy conservation;Vestibular;Joint Manipulations    PT Next Visit Plan Prepare for DC: most LT goals met or partially met; update HEP as needed.    PT Home Exercise Plan access code: [email or web address] added quadruped knee flexion stretch and half stretch off a step 7/21    Consulted and Agree with Plan of Care Patient             Patient will benefit from skilled therapeutic intervention in order to improve the following deficits and impairments:  Abnormal gait, Decreased balance, Decreased range of motion, Decreased strength, Hypomobility, Impaired sensation, Pain, Improper body mechanics, Impaired flexibility, Decreased mobility  Visit Diagnosis: Unsteadiness on feet  Other abnormalities of gait and mobility  Muscle weakness (generalized)  Other lack of coordination  Acute low back pain, unspecified back pain laterality, unspecified whether sciatica present  Chronic bilateral low back pain, unspecified whether sciatica present  Acute right-sided low back pain without sciatica     Problem List There are no problems to display for this patient.  12:31 PM, 10/29/20 AEtta Grandchild PT, DPT Physical Therapist - [facility]- [phone]    BEtta Grandchild8/22/2022, 12:13 PM  CWilliamsMAIN [facility]Special[facility] UtahSERVICES [address] [address]Phone: [phone]  Fax:  [phone] Name: Nicholas BALINGITMRN: [id number] of Birth: [birth year]

## 2020-11-01 ENCOUNTER — Ambulatory Visit: Payer: Medicare Other

## 2020-11-01 ENCOUNTER — Other Ambulatory Visit: Payer: Self-pay

## 2020-11-01 DIAGNOSIS — R2681 Unsteadiness on feet: Secondary | ICD-10-CM

## 2020-11-01 DIAGNOSIS — R278 Other lack of coordination: Secondary | ICD-10-CM

## 2020-11-01 DIAGNOSIS — R2689 Other abnormalities of gait and mobility: Secondary | ICD-10-CM

## 2020-11-01 DIAGNOSIS — M545 Low back pain, unspecified: Secondary | ICD-10-CM

## 2020-11-01 NOTE — Therapy (Signed)
[place] MAIN [facility] SERVICES [address] [place], [name], [address] Phone: [phone]   Fax:  [phone]  Physical Therapy Treatment  Patient Details  Name: Nicholas [name] MRN: [id number] Date of Birth: December 27, 1945 Referring Provider (PT): Rusty Aus, MD   Encounter Date: 11/01/2020   PT End of Session - 11/01/20 1753     Visit Number 48    Number of Visits 54    Date for PT Re-Evaluation 11/22/20    Authorization Type [employer] Medicare, BCBS supplemental    Authorization Time Period 3/66/44- 0/34/74; recert on 2/59/56; new cert for 3/87-5/64    Progress Note Due on Visit 50    PT Start Time 1147    PT Stop Time 1230    PT Time Calculation (min) 43 min    Equipment Utilized During Treatment Gait belt    Activity Tolerance Patient tolerated treatment well;No increased pain    Behavior During Therapy [facility] for tasks assessed/performed             Past Medical History:  Diagnosis Date   A-fib ([place])    Cancer ([place]) 10/25/2019   Prostate   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   Subjective Assessment - 11/01/20 1150     Subjective Pt reports his back feels irritated. He says it is not constantly painful but he feels it most when he gets up. Pt reports he also has low back pain now when he is trying to clean himself during toileting. Pt reports he has been walking more. He reports he walks for at least 20 minutes at a time.    Pertinent History Pt is a pleasant 75 y.o. male  referred to PT for LBP which occured early February. Pt has a PMH of prostate cx, TIA, chronic a-fib, and recent hematuria. Pt has pain with t/f from STS but feels better with walking. Pt states his LBP has been improving but his knees and hips have neem giving him more pain. Pt's LBP is a strong ache that is on the R side but denies radicular symptoms. Pt reports exercises given by MD have been beneficial and improving his LBP symptoms. Currently in no pain during subjective. when LBP is flared up he reports 4-5/10 NPS. Worst pain is 5-6/10 NPS but pt is unable to explain what causes worst pain but reports possibly most pain due sitting for long periods of time. Prednisone has improved LBP. Pt reports his goals in therapy are to improve balance although he did not mention to Dr. Sabra [name] when referred to PT for his LBP. Also wants to improve his strength. He reports he requires objects to help support him when walking such as walls or furniture. Pt denies falls, but  spouse reports a fall last fall at home where he tripped over a blanket. Has reported near falls where he needed touching of an object to correct. Pt uses a stick now and then for balance but does report  difficulty transferring from sturdy to unstardy surfaces (i.e. stepping over a stream) and walking stick helps.    Currently in Pain? Yes    Pain Location Back    Pain Orientation Lower             INTERVENTION THIS DATE:   PT provides education regarding course of therapy, recent exacerbation of pt LBP, expected course of remaining therapy. PT also reinforces importance of HEP and instructs pt in which exercises to focus on to address increased LBP. Pt verbalizes understanding.   At support surface with CGA throughout: SLB 4x30 sec BLEs; intermittent UE support Tandem stance 4x30 sec BLEs; intermittent UE support Modified SLB drills, one LE on soccer ball moving it side-to-side, forward/backward, CW/CC -  x multiple reps BLEs;  intermittent UE support/toe-touch. One cone placed in front and two placed laterally; Pt performs cone taps - x multiple reps each LE. Knocks over cones 2x. Continued reports of difficulty with balance on LLE.    Seated: Pball rollouts forward/backward, side-to-side 10x for each in each direction with with 2-3 sec holds. Pt reports feels good to his back.  Seated piriformis stretch - 2x30-45 sec BLEs. Instruction to perform in pain-free range. Seated hamstring stretch with gastrocsol stretch - 2x30-45 sec BLEs. Progresses to addition of sciatic nerve floss for 30 sec each LE.  Reports improvement in LBP at end of session.   PT provides education primarily in the form of VC/Demo to facilitate movement at target joints and correct muscle activation with all exercises performed. Pt shows good carryover after cuing       PT Education - 11/01/20 1752     Education Details exercise technique, body mechanics    Person(s) Educated Patient    Methods Explanation;Demonstration;Verbal cues    Comprehension Returned demonstration;Verbalized understanding              PT Short Term Goals - 10/04/20 1655       PT SHORT TERM GOAL #1   Title Patient will be independent in home exercise program to improve strength/mobility for better functional independence with ADLs.    Baseline 3/3: established HEP today. See medbridge access code; 06/07/2020 pt reports doing some of his HEP, but that he has difficulty with some of the supine exercises; 4/28: pt not yet fully indep. with HEP; 5/10: pt inconsistently performing HEP; 6/20: doing HEP 3x a week;    Time 4    Period Weeks    Status Achieved    Target Date 08/02/20               PT Long Term Goals - 10/04/20 1655       PT LONG TERM GOAL #1   Title Patient will increase FOTO score equal to or greater than 69 to demonstrate statistically significant improvement in mobility and quality of life.    Baseline 3/3: 55; 3/31 59%; 4/28: 60%;  5/9: 56%, 6/20: 63%; 7/21: 53% (impacted by pt recurrent onset of LBP after lifting heavy furniture)    Time 8    Period Weeks    Status On-going    Target Date 11/22/20      PT LONG TERM GOAL #2   Title Patient (> 80 years old) will complete five times sit to stand test in < 12 seconds indicating an increased LE strength and improved balance.    Baseline 3/3: 15.38 m/s; 06/07/2020:  11 sec, 6/20: 13.5 sec with arms across chest; 7/21: 12.3 sec  Time 8    Period Weeks    Status Partially Met    Target Date 11/22/20      PT LONG TERM GOAL #3   Title Pt will improve hip extension AROM > 10 deg bilaterally to normalize reciprocal gait and balance.    Baseline 3/3: R/L 4 deg/3 deg; 3/31 R/L 5 deg/3 deg; 4/28: 5 deg R, 0 deg. L; 4/9: 4-5 deg. ext B, 6/20: 8 degrees bilaterally; 7/21: L hip 8 de.g, R hip 5 deg.    Time 8    Period Weeks    Status On-going    Target Date 11/22/20      PT LONG TERM GOAL #4   Title Pt will improve Berg score > 46 to display decreased risk of falls.    Baseline 3/3: Will assess upon MD approval; 06/07/2020 52/56; 4/28: 55/56    Time 8    Period Weeks    Status Achieved      PT LONG TERM GOAL #5   Title The patient will be able to maintain at least 10 seconds of SLB on BLEs in order to decrease risk of falls when navigating obstacles, curbs and steps.    Baseline 06/07/2020: pt unable to maintain more than 3 sec of SLB without UE support; 4/28: 15 sec BLEs    Time 4    Period Weeks    Status Achieved      PT LONG TERM GOAL #6   Title Patient will increase dynamic gait index score to >20/24 as to demonstrate reduced fall risk and improved dynamic gait balance for better safety with community/home ambulation.    Baseline 4/28: 17/24; 5/9: 19/24, 6/20: 20/24; 7/21: 22/24    Time 4    Period Weeks    Status Achieved                   Plan - 11/01/20 1803     Clinical Impression Statement Part of session focused on educating pt in course of  PT/reinforcing HEP to address recent exacerbation in LBP. Pt also performed gentle LE stretching and spinal mobility exercises, after which he reported improvement in low back sx. While still challenging for pt, he shows overall progress with balance exercises requiring only CGA throughout today's session. The pt will benefit from further skilled PT to continue to improve pain, LE strength, and balance in order to increase ease and safety with all functional mobility.    Personal Factors and Comorbidities Age;Comorbidity 3+;Past/Current Experience    Comorbidities A-fib, lumbar disc disease, prostate cancer, and  history of TIA.    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    Rehab Potential Good    PT Frequency 2x / week    PT Duration 8 weeks    PT Treatment/Interventions ADLs/Self Care Home Management;Biofeedback;Aquatic Therapy;Canalith Repostioning;Cryotherapy;Electrical Stimulation;Iontophoresis 36m/ml Dexamethasone;Moist Heat;Traction;Ultrasound;Gait training;Stair training;DME Instruction;Functional mobility training;Therapeutic activities;Therapeutic exercise;Balance training;Neuromuscular re-education;Patient/family education;Manual techniques;Passive range of motion;Dry needling;Energy conservation;Vestibular;Joint Manipulations    PT Next Visit Plan Prepare for DC: most LT goals met or partially met; update HEP as needed.    PT Home Exercise Plan access code: [email or web address] added quadruped knee flexion stretch and half stretch off a step 7/21, no updates on this date    Consulted and Agree with Plan of Care Patient             Patient will benefit from skilled therapeutic intervention  in order to improve the following deficits and impairments:  Abnormal gait, Decreased balance, Decreased range of motion,  Decreased strength, Hypomobility, Impaired sensation, Pain, Improper body mechanics, Impaired flexibility, Decreased mobility  Visit Diagnosis: Acute low back pain, unspecified back pain laterality, unspecified whether sciatica present  Unsteadiness on feet  Other abnormalities of gait and mobility  Other lack of coordination     Problem List There are no problems to display for this patient.  Ricard Dillon PT, DPT  11/01/2020, 6:06 PM  [place] MAIN [facility] SERVICES [address] [place], [name], [address] Phone: [phone]   Fax:  [phone]  Name: YOHANNES WAIBEL MRN: [id number] Date of Birth: [birth year]

## 2020-11-05 ENCOUNTER — Ambulatory Visit: Payer: Medicare Other

## 2020-11-05 ENCOUNTER — Other Ambulatory Visit: Payer: Self-pay

## 2020-11-05 DIAGNOSIS — M545 Low back pain, unspecified: Secondary | ICD-10-CM

## 2020-11-05 DIAGNOSIS — M6281 Muscle weakness (generalized): Secondary | ICD-10-CM

## 2020-11-05 DIAGNOSIS — R2681 Unsteadiness on feet: Secondary | ICD-10-CM | POA: Diagnosis not present

## 2020-11-05 DIAGNOSIS — R2689 Other abnormalities of gait and mobility: Secondary | ICD-10-CM

## 2020-11-05 DIAGNOSIS — R278 Other lack of coordination: Secondary | ICD-10-CM

## 2020-11-05 NOTE — Therapy (Signed)
[place] MAIN [facility] SERVICES [address] [place], [name], [address] Phone: [phone]   Fax:  [phone]  Physical Therapy Treatment  Patient Details  Name: Nicholas [name] MRN: [id number] Date of Birth: [birth year] Referring Provider (PT): Rusty Aus, MD   Encounter Date: 11/05/2020   PT End of Session - 11/06/20 0820     Visit Number 63    Number of Visits 54    Date for PT Re-Evaluation 11/22/20    Authorization Type [employer] Medicare, BCBS supplemental    Authorization Time Period 9/92/42- 6/83/41; recert on 9/62/22; new cert for 9/79-8/92    Progress Note Due on Visit 50    PT Start Time 1148    PT Stop Time 1228    PT Time Calculation (min) 40 min    Equipment Utilized During Treatment Gait belt    Activity Tolerance Patient tolerated treatment well;No increased pain    Behavior During Therapy [facility] for tasks assessed/performed             Past Medical History:  Diagnosis Date   A-fib ([place])    Cancer ([place]) 10/25/2019   Prostate   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   Subjective Assessment - 11/05/20 1148     Subjective Pt reports he feels his back has improved but is still having some R side LBP.    Pertinent History Pt is a pleasant 75 y.o. male referred to PT for LBP which occured early February. Pt has a PMH of prostate cx, TIA, chronic a-fib, and recent hematuria. Pt has pain with t/f from STS but feels better with walking. Pt states his LBP has been improving but  his knees and hips have neem giving him more pain. Pt's LBP is a strong ache that is on the R side but denies radicular symptoms. Pt reports exercises given by MD have been beneficial and improving his LBP symptoms. Currently in no pain during subjective. when LBP is flared up he reports 4-5/10 NPS. Worst pain is 5-6/10 NPS but pt is unable to explain what causes worst pain but reports possibly most pain due sitting for long periods of time. Prednisone has improved LBP. Pt reports his goals in therapy are to improve balance although he did not mention to Dr. Sabra [name] when referred to PT for his LBP. Also wants to improve his strength. He reports he requires objects to help support him when walking such as walls or furniture. Pt denies falls, but  spouse reports a fall last fall at home where he tripped over a blanket. Has reported near falls where he needed touching of an object to correct. Pt uses a stick now and then for balance but does report difficulty transferring from sturdy to unstardy surfaces (i.e. stepping over a stream) and walking stick helps.    Currently in Pain? Yes    Pain Location Back    Pain Orientation Right;Lower  INTERVENTION THIS DATE:    Therex- Seated: Pball rollouts forward/backward, side-to-side, 10x for each in each  direction with with 2-3 sec holds at end range.  Instructs pt through warm-up: Seated ankle alphabet each LE - 1 round each Seated LAQ - 20x BLEs  Mini squats- 10x through limited ROM d/t reports of knee pain Seated weighted LAQ to address pt reports of "knee weakness" with bending/squatting: 3# AW on RLE, 4#AW on LLE: 10x (no pain) 5# AW on BLES 10x each (no pain) STS 2x6 - not painful  Neuro Re-Ed- CGA provided throughout, standing at support surface  Standing on half-foam: Pt with BLEs in PF - x multiple bouts of 30 sec. Exhibits good postural stability. Does not require use of UE support.  Pt with BLEs in DF- x multiple bouts of 30  sec. Decreased postural stability. Requires intermittent UE support to regain balance d/t LOB both posteriorly and anteriorly.   Pt standing on ariex, basketball: Standing with NBOS - one round. Pt exhibits good postural control. Only a few instances of requiring UE support to steady self. Semi-tandem (each LE as primary stance leg) - 1 round each. Requires intermittent UE support. Continued increased difficulty with LLE as primary stance leg.     Pt reports improvement in knee pain at end of session/no sx.    PT Education - 11/06/20 0819     Education Details exercise technique, body mechanics    Person(s) Educated Patient    Methods Explanation;Demonstration;Verbal cues    Comprehension Verbalized understanding;Returned demonstration;Need further instruction              PT Short Term Goals - 10/04/20 1655       PT SHORT TERM GOAL #1   Title Patient will be independent in home exercise program to improve strength/mobility for better functional independence with ADLs.    Baseline 3/3: established HEP today. See medbridge access code; 06/07/2020 pt reports doing some of his HEP, but that he has difficulty with some of the supine exercises; 4/28: pt not yet fully indep. with HEP; 5/10: pt inconsistently performing HEP; 6/20: doing HEP 3x a week;    Time 4    Period Weeks    Status Achieved    Target Date 08/02/20               PT Long Term Goals - 10/04/20 1655       PT LONG TERM GOAL #1   Title Patient will increase FOTO score equal to or greater than 69 to demonstrate statistically significant improvement in mobility and quality of life.    Baseline 3/3: 55; 3/31 59%; 4/28: 60%; 5/9: 56%, 6/20: 63%; 7/21: 53% (impacted by pt recurrent onset of LBP after lifting heavy furniture)    Time 8    Period Weeks    Status On-going    Target Date 11/22/20      PT LONG TERM GOAL #2   Title Patient (> 23 years old) will complete five times sit to stand test in < 12 seconds  indicating an increased LE strength and improved balance.    Baseline 3/3: 15.38 m/s; 06/07/2020:  11 sec, 6/20: 13.5 sec with arms across chest; 7/21: 12.3 sec    Time 8    Period Weeks    Status Partially Met    Target Date 11/22/20      PT LONG TERM GOAL #3   Title Pt will improve hip extension AROM > 10 deg bilaterally to normalize  reciprocal gait and balance.    Baseline 3/3: R/L 4 deg/3 deg; 3/31 R/L 5 deg/3 deg; 4/28: 5 deg R, 0 deg. L; 4/9: 4-5 deg. ext B, 6/20: 8 degrees bilaterally; 7/21: L hip 8 de.g, R hip 5 deg.    Time 8    Period Weeks    Status On-going    Target Date 11/22/20      PT LONG TERM GOAL #4   Title Pt will improve Berg score > 46 to display decreased risk of falls.    Baseline 3/3: Will assess upon MD approval; 06/07/2020 52/56; 4/28: 55/56    Time 8    Period Weeks    Status Achieved      PT LONG TERM GOAL #5   Title The patient will be able to maintain at least 10 seconds of SLB on BLEs in order to decrease risk of falls when navigating obstacles, curbs and steps.    Baseline 06/07/2020: pt unable to maintain more than 3 sec of SLB without UE support; 4/28: 15 sec BLEs    Time 4    Period Weeks    Status Achieved      PT LONG TERM GOAL #6   Title Patient will increase dynamic gait index score to >20/24 as to demonstrate reduced fall risk and improved dynamic gait balance for better safety with community/home ambulation.    Baseline 4/28: 17/24; 5/9: 19/24, 6/20: 20/24; 7/21: 22/24    Time 4    Period Weeks    Status Achieved                Plan - 11/06/20 0831     Clinical Impression Statement Continued focus on mobility exercises and LE strengthening to address recent increase in LBP. Pt today with overall improvement in LBP. Strengthening also performed to address decreased feelings of stability and knee pain with bending to pick items up off floor. Pt initially with some knee pain with mini squats that resolved following further  intervention. Pt shows good postural stability on compliant surface today with NBOS and dynamic task. He is still most challenged with activities that require main support from LLE. The pt will benefit from further skilled PT to further address pain, strength and balance deficits in order to increase safety with all ADLs.    Personal Factors and Comorbidities Age;Comorbidity 3+;Past/Current Experience    Comorbidities A-fib, lumbar disc disease, prostate cancer, and  history of TIA.    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    Rehab Potential Good    PT Frequency 2x / week    PT Duration 8 weeks    PT Treatment/Interventions ADLs/Self Care Home Management;Biofeedback;Aquatic Therapy;Canalith Repostioning;Cryotherapy;Electrical Stimulation;Iontophoresis 80m/ml Dexamethasone;Moist Heat;Traction;Ultrasound;Gait training;Stair training;DME Instruction;Functional mobility training;Therapeutic activities;Therapeutic exercise;Balance training;Neuromuscular re-education;Patient/family education;Manual techniques;Passive range of motion;Dry needling;Energy conservation;Vestibular;Joint Manipulations    PT Next Visit Plan Prepare for DC: most LT goals met or partially met; update HEP as needed.    PT Home Exercise Plan access code: [email or web address] added quadruped knee flexion stretch and half stretch off a step 7/21, no updates on this date    Consulted and Agree with Plan of Care Patient             Patient will benefit from skilled therapeutic intervention in order to improve the following deficits and impairments:  Abnormal gait, Decreased balance, Decreased range of motion, Decreased strength, Hypomobility, Impaired sensation, Pain,  Improper body mechanics, Impaired flexibility, Decreased mobility  Visit  Diagnosis: Unsteadiness on feet  Muscle weakness (generalized)  Other abnormalities of gait and mobility  Other lack of coordination  Acute right-sided low back pain without sciatica     Problem List There are no problems to display for this patient.  Ricard Dillon PT, DPT 11/06/2020, 8:36 AM  [place] MAIN [facility] SERVICES [address] [place], [name], [address] Phone: [phone]   Fax:  [phone]  Name: Nicholas [name] MRN: [id number] Date of Birth: 1945/07/12

## 2020-11-08 ENCOUNTER — Ambulatory Visit: Payer: Medicare Other | Attending: Internal Medicine

## 2020-11-08 ENCOUNTER — Other Ambulatory Visit: Payer: Self-pay

## 2020-11-08 ENCOUNTER — Ambulatory Visit: Payer: Medicare Other | Admitting: Dermatology

## 2020-11-08 DIAGNOSIS — M545 Low back pain, unspecified: Secondary | ICD-10-CM | POA: Insufficient documentation

## 2020-11-08 DIAGNOSIS — R2689 Other abnormalities of gait and mobility: Secondary | ICD-10-CM | POA: Diagnosis present

## 2020-11-08 DIAGNOSIS — G8929 Other chronic pain: Secondary | ICD-10-CM | POA: Diagnosis present

## 2020-11-08 DIAGNOSIS — R2681 Unsteadiness on feet: Secondary | ICD-10-CM | POA: Diagnosis not present

## 2020-11-08 DIAGNOSIS — M6281 Muscle weakness (generalized): Secondary | ICD-10-CM | POA: Diagnosis present

## 2020-11-08 DIAGNOSIS — R278 Other lack of coordination: Secondary | ICD-10-CM | POA: Insufficient documentation

## 2020-11-08 NOTE — Therapy (Signed)
[place] MAIN [facility] SERVICES [address] [place], [name], [address] Phone: [phone]   Fax:  [phone]  Physical Therapy Treatment/Physical Therapy Progress Note Visit 50  Dates of reporting period  10/04/2020   to   11/08/2020   Patient Details  Name: Nicholas [name] MRN: [id number] Date of Birth: 1945/05/27 Referring Provider (PT): Rusty Aus, MD   Encounter Date: 11/08/2020   PT End of Session - 11/08/20 1912     Visit Number 50    Number of Visits 54    Date for PT Re-Evaluation 11/22/20    Authorization Type [employer], [employer]BS supplemental    Authorization Time Period 09/05/34- 09/05/45; recert on 6/54/65; new cert for 0/35-4/65    Progress Note Due on Visit 50    PT Start Time 1146    PT Stop Time 1229    PT Time Calculation (min) 43 min    Equipment Utilized During Treatment Gait belt    Activity Tolerance Patient tolerated treatment well;No increased pain    Behavior During Therapy [facility] for tasks assessed/performed             Past Medical History:  Diagnosis Date   A-fib ([place])    Cancer ([place]) 10/25/2019   Prostate   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   Subjective Assessment - 11/08/20 1150     Subjective Pt reports "back pain is better." Pt reports he feels his balance has also improved.    Pertinent History Pt is a pleasant 75 y.o. male referred to PT for LBP which occured early February. Pt has a PMH of prostate cx, TIA, chronic a-fib, and recent  hematuria. Pt has pain with t/f from STS but feels better with walking. Pt states his LBP has been improving but his knees and hips have neem giving him more pain. Pt's LBP is a strong ache that is on the R side but denies radicular symptoms. Pt reports exercises given by MD have been beneficial and improving his LBP symptoms. Currently in no pain during subjective. when LBP is flared up he reports 4-5/10 NPS. Worst pain is 5-6/10 NPS but pt is unable to explain what causes worst pain but reports possibly most pain due sitting for long periods of time. Prednisone has improved LBP. Pt reports his goals in therapy are to improve balance although he did not mention to Dr. Sabra [name] when referred to PT for his LBP. Also wants to improve his strength. He reports he requires objects to help support him when walking such as walls or furniture. Pt denies falls, but  spouse reports a fall last fall at home where he tripped over a blanket. Has reported near falls where he needed touching of an object to correct. Pt uses a stick now and then for balance but does report difficulty transferring from sturdy to unstardy surfaces (i.e. stepping over a stream) and walking stick helps.    Currently in Pain? No/denies  INTERVENTIONS  Reassessed goals (see goals for details)  At support surface with CGA: - Pt performs 4x30 sec warm-up of SLB on BLEs SLB testing: Pt maintains 9 seconds of SLB on LLE Pt maintains 5 seconds of SLB on RLE  Following assessment pt practiced SLB further for 4x30 seconds BLEs, with use of intermittent UE support throughout. Tandem stance 4x30 sec BLEs; continued use of intermittent UE support. However, steadier than SLB>  Hip extension AROM: LLE: 7 deg. RLE: 9 deg.  FOTO: 58%  Pt performs 1x5 STS for warm-up 5xSTS: 11.7 sec (goal achieved)    At support bar: Seated heel raises 2x15 Standing heel raises 3x12 Pt attempts standing DF but is unable to perform  exercise  Seated DF: 3x15. Pt reports fatigue  PT adds standing heel raises to pt's HEP: access code: [email or web address] PT educates pt on # of exercises to perform per week (recommends picking 2-3) for HEP. Instructs pt in sets, reps, rest and frequency, performing within pain-free ranges.   Assessment: Goals reassessed this session for progress note. Pt continues to make gains in therapy, achieving 5xSTS goal and making progress toward remaining hip extension and FOTO goal. Pt with improvement in LBP from recent exacerbation, reporting no pain currently and that he has been able to reduce pain at home with HEP. While pt shows progress, he does exhibit some decrease in SLB ability (previously, pt had met this goal), and he continues to exhibit poor ankle PF/DF strength B. Patient's condition has the potential to improve in response to therapy. Maximum improvement is yet to be obtained. The anticipated improvement is attainable and reasonable in a generally predictable time. The pt will benefit from a trial period of further skilled PT in order to continue to improve strength, balance and mobility to increase safety with ADLs and decrease fall risk.      PT Education - 11/08/20 1911     Education Details reassessment findings, indications for PT    Person(s) Educated Patient    Methods Explanation    Comprehension Verbalized understanding              PT Short Term Goals - 11/08/20 1335       PT SHORT TERM GOAL #1   Title Patient will be independent in home exercise program to improve strength/mobility for better functional independence with ADLs.    Baseline 3/3: established HEP today. See medbridge access code; 06/07/2020 pt reports doing some of his HEP, but that he has difficulty with some of the supine exercises; 4/28: pt not yet fully indep. with HEP; 5/10: pt inconsistently performing HEP; 6/20: doing HEP 3x a week;    Time 4    Period Weeks    Status Achieved    Target Date 08/02/20                PT Long Term Goals - 11/08/20 1152       PT LONG TERM GOAL #1   Title Patient will increase FOTO score equal to or greater than 69 to demonstrate statistically significant improvement in mobility and quality of life.    Baseline 3/3: 55; 3/31 59%; 4/28: 60%; 5/9: 56%, 6/20: 63%; 7/21: 53% (impacted by pt recurrent onset of LBP after lifting heavy furniture); 9/1: 58%    Time 8    Period Weeks    Status On-going    Target Date 11/22/20      PT LONG TERM GOAL #2   Title Patient (> 60  years old) will complete five times sit to stand test in < 12 seconds indicating an increased LE strength and improved balance.    Baseline 3/3: 15.38 m/s; 06/07/2020:  11 sec, 6/20: 13.5 sec with arms across chest; 7/21: 12.3 sec; 9/1: 11.7 sec    Time 8    Period Weeks    Status Achieved      PT LONG TERM GOAL #3   Title Pt will improve hip extension AROM > 10 deg bilaterally to normalize reciprocal gait and balance.    Baseline 3/3: R/L 4 deg/3 deg; 3/31 R/L 5 deg/3 deg; 4/28: 5 deg R, 0 deg. L; 4/9: 4-5 deg. ext B, 6/20: 8 degrees bilaterally; 7/21: L hip 8 de.g, R hip 5 deg.; 9/1: L 7 deg., R 9 deg.    Time 8    Period Weeks    Status On-going    Target Date 11/22/20      PT LONG TERM GOAL #4   Title Pt will improve Berg score > 46 to display decreased risk of falls.    Baseline 3/3: Will assess upon MD approval; 06/07/2020 52/56; 4/28: 55/56    Time 8    Period Weeks    Status Achieved      PT LONG TERM GOAL #5   Title The patient will be able to maintain at least 10 seconds of SLB on BLEs in order to decrease risk of falls when navigating obstacles, curbs and steps.    Baseline 06/07/2020: pt unable to maintain more than 3 sec of SLB without UE support; 4/28: 15 sec BLEs; 9/1: 9 sec R, 5 sec L    Time 4    Period Weeks    Status Achieved      PT LONG TERM GOAL #6   Title Patient will increase dynamic gait index score to >20/24 as to demonstrate reduced fall risk and  improved dynamic gait balance for better safety with community/home ambulation.    Baseline 4/28: 17/24; 5/9: 19/24, 6/20: 20/24; 7/21: 22/24    Time 4    Period Weeks    Status Achieved                   Plan - 11/08/20 1341     Clinical Impression Statement Goals reassessed this session for progress note. Pt continues to make gains in therapy, achieving 5xSTS goal and making progress toward remaining hip extension and FOTO goal. Pt with improvement in LBP from recent exacerbation, reporting no pain currently and that he has been able to reduce pain at home with HEP. While pt shows progress, he does exhibit some decrease in SLB ability (previously, pt had met this goal), and he continues to exhibit poor ankle PF/DF strength B. Patient's condition has the potential to improve in response to therapy. Maximum improvement is yet to be obtained. The anticipated improvement is attainable and reasonable in a generally predictable time. The pt will benefit from a trial period of further skilled PT in order to continue to improve strength, balance and mobility to increase safety with ADLs and decrease fall risk.    Personal Factors and Comorbidities Age;Comorbidity 3+;Past/Current Experience    Comorbidities A-fib, lumbar disc disease, prostate cancer, and  history of TIA.    Examination-Activity Limitations Reach Overhead;Stand;Bend;Lift;Locomotion Level;Squat    Examination-Participation Restrictions Community Activity;Yard Work    Stability/Clinical Decision Making Evolving/Moderate complexity    Rehab Potential Good    PT Frequency 2x / week  PT Duration 8 weeks    PT Treatment/Interventions ADLs/Self Care Home Management;Biofeedback;Aquatic Therapy;Canalith Repostioning;Cryotherapy;Electrical Stimulation;Iontophoresis 14m/ml Dexamethasone;Moist Heat;Traction;Ultrasound;Gait training;Stair training;DME Instruction;Functional mobility training;Therapeutic activities;Therapeutic  exercise;Balance training;Neuromuscular re-education;Patient/family education;Manual techniques;Passive range of motion;Dry needling;Energy conservation;Vestibular;Joint Manipulations    PT Next Visit Plan Prepare for DC: most LT goals met or partially met; update HEP as needed.    PT Home Exercise Plan access code: [email or web address] added quadruped knee flexion stretch and half stretch off a step 7/21, no updates on this date    Consulted and Agree with Plan of Care Patient             Patient will benefit from skilled therapeutic intervention in order to improve the following deficits and impairments:  Abnormal gait, Decreased balance, Decreased range of motion, Decreased strength, Hypomobility, Impaired sensation, Pain, Improper body mechanics, Impaired flexibility, Decreased mobility  Visit Diagnosis: Unsteadiness on feet  Other abnormalities of gait and mobility  Muscle weakness (generalized)     Problem List There are no problems to display for this patient.  HRicard DillonPT, DPT 11/08/2020, 7:28 PM  [place] [facility] [address] [place] [address]Phone: [phone]  Fax:  [phone] Name: COLAND ARQUETTEMRN: [id number] of Birth: [birth year]

## 2020-11-14 ENCOUNTER — Ambulatory Visit (INDEPENDENT_AMBULATORY_CARE_PROVIDER_SITE_OTHER): Payer: Medicare Other | Admitting: Dermatology

## 2020-11-14 ENCOUNTER — Other Ambulatory Visit: Payer: Self-pay

## 2020-11-14 DIAGNOSIS — L578 Other skin changes due to chronic exposure to nonionizing radiation: Secondary | ICD-10-CM | POA: Diagnosis not present

## 2020-11-14 DIAGNOSIS — L820 Inflamed seborrheic keratosis: Secondary | ICD-10-CM

## 2020-11-14 DIAGNOSIS — L219 Seborrheic dermatitis, unspecified: Secondary | ICD-10-CM | POA: Diagnosis not present

## 2020-11-14 DIAGNOSIS — L570 Actinic keratosis: Secondary | ICD-10-CM

## 2020-11-14 DIAGNOSIS — L821 Other seborrheic keratosis: Secondary | ICD-10-CM | POA: Diagnosis not present

## 2020-11-14 NOTE — Patient Instructions (Signed)

## 2020-11-14 NOTE — Progress Notes (Signed)
Follow-Up Visit   Subjective  Nicholas [name] is a 75 y.o. male who presents for the following: Actinic Keratosis (Of the face - previously tx with LN2 check for persistence today) and ISK's  (Of the trunk and extremities - patient has more irritated skin lesions today that he would like treated). Seborrheic dermatitis - patient currently using Ketoconazole 2% cream QD on Monday, Wednesday, and Friday alternating with HC 2.5% cream on Tuesday, Thursday, and Saturday. He is unable to tell if medications are helping.   The following portions of the chart were reviewed this encounter and updated as appropriate:   Tobacco  Allergies  Meds  Problems  Med Hx  Surg Hx  Fam Hx     Review of Systems:  No other skin or systemic complaints except as noted in HPI or Assessment and Plan.  Objective  Well appearing patient in no apparent distress; mood and affect are within normal limits.  A focused examination was performed including face, trunk, extremities. Relevant physical exam findings are noted in the Assessment and Plan.  Face, postauricular Post auricular areas clear. Pink patches with greasy scale of the face.  Face, neck, trunk, and extremities x 24 (24) Erythematous keratotic or waxy stuck-on papule or plaque.   Face x 4 (4) Erythematous thin papules/macules with gritty scale.   Assessment & Plan  Seborrheic dermatitis -improved but persistent especially on the face.  Chronic and persistent.  Not to goal. Face, postauricular  Seborrheic Dermatitis  -  is a chronic persistent rash characterized by pinkness and scaling most commonly of the mid face but also can occur on the scalp (dandruff), ears; mid chest, mid back and groin.  It tends to be exacerbated by stress and cooler weather.  People who have neurologic disease may experience new onset or exacerbation of existing seborrheic dermatitis.  The condition is not curable but treatable and can be controlled.  Continue  Ketoconazole 2% cream QD on Monday, Wednesday, and Friday alternating with HC 2.5% cream QD on Tuesday, Thursday, and Saturday.  Related Medications ketoconazole (NIZORAL) 2 % cream Apply 1 application topically daily. Monday, Wednesday, Friday  hydrocortisone 2.5 % cream Apply topically daily. Tuesday, Thursday, Saturday  Inflamed seborrheic keratosis Face, neck, trunk, and extremities x 24  Destruction of lesion - Face, neck, trunk, and extremities x 24 Complexity: simple   Destruction method: cryotherapy   Informed consent: discussed and consent obtained   Timeout:  patient name, date of birth, surgical site, and procedure verified Lesion destroyed using liquid nitrogen: Yes   Region frozen until ice ball extended beyond lesion: Yes   Outcome: patient tolerated procedure well with no complications   Post-procedure details: wound care instructions given    AK (actinic keratosis) (4) Face x 4  Destruction of lesion - Face x 4 Complexity: simple   Destruction method: cryotherapy   Informed consent: discussed and consent obtained   Timeout:  patient name, date of birth, surgical site, and procedure verified Lesion destroyed using liquid nitrogen: Yes   Region frozen until ice ball extended beyond lesion: Yes   Outcome: patient tolerated procedure well with no complications   Post-procedure details: wound care instructions given    Seborrheic Keratoses - Stuck-on, waxy, tan-brown papules and/or plaques  - Benign-appearing - Discussed benign etiology and prognosis. - Observe - Call for any changes  Actinic Damage - chronic, secondary to cumulative UV radiation exposure/sun exposure over time - diffuse scaly erythematous macules with underlying dyspigmentation - Recommend  daily broad spectrum sunscreen SPF 30+ to sun-exposed areas, reapply every 2 hours as needed.  - Recommend staying in the shade or wearing long sleeves, sun glasses (UVA+UVB protection) and wide brim hats  (4-inch brim around the entire circumference of the hat). - Call for new or changing lesions.  Return in about 6 months (around 05/14/2021) for AK and ISK follow up .  Luther Redo, CMA, am acting as scribe for Sarina Ser, MD . Documentation: I have reviewed the above documentation for accuracy and completeness, and I agree with the above.  Sarina Ser, MD

## 2020-11-18 ENCOUNTER — Encounter: Payer: Self-pay | Admitting: Dermatology

## 2020-11-19 ENCOUNTER — Ambulatory Visit: Payer: Medicare Other | Admitting: Physical Therapy

## 2020-11-22 ENCOUNTER — Encounter: Payer: Self-pay | Admitting: Physical Therapy

## 2020-11-22 ENCOUNTER — Ambulatory Visit: Payer: Medicare Other | Admitting: Physical Therapy

## 2020-11-22 ENCOUNTER — Other Ambulatory Visit: Payer: Self-pay

## 2020-11-22 DIAGNOSIS — R2689 Other abnormalities of gait and mobility: Secondary | ICD-10-CM

## 2020-11-22 DIAGNOSIS — R2681 Unsteadiness on feet: Secondary | ICD-10-CM | POA: Diagnosis not present

## 2020-11-22 DIAGNOSIS — R278 Other lack of coordination: Secondary | ICD-10-CM

## 2020-11-22 DIAGNOSIS — M6281 Muscle weakness (generalized): Secondary | ICD-10-CM

## 2020-11-22 DIAGNOSIS — M545 Low back pain, unspecified: Secondary | ICD-10-CM

## 2020-11-22 NOTE — Therapy (Signed)
[place] MAIN [facility] SERVICES [address] [place], [name], [address] Phone: [phone]   Fax:  [phone]  Physical Therapy Treatment  Patient Details  Name: Nicholas [name] MRN: [id number] Date of Birth: 1945-11-24 Referring Provider (PT): Rusty Aus, MD   Encounter Date: 11/22/2020   PT End of Session - 11/22/20 1023     Visit Number 51    Number of Visits 84    Date for PT Re-Evaluation 01/17/21    Authorization Type [employer] Medicare, BCBS supplemental    Authorization Time Period 03/15/24- 9/48/54; recert on 09/03/01; new cert for 5/00-9/38    Progress Note Due on Visit 50    PT Start Time 1018    PT Stop Time 1100    PT Time Calculation (min) 42 min    Equipment Utilized During Treatment Gait belt    Activity Tolerance Patient tolerated treatment well;No increased pain    Behavior During Therapy [facility] for tasks assessed/performed             Past Medical History:  Diagnosis Date   A-fib ([place])    Cancer ([place]) 10/25/2019   Prostate   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   Subjective Assessment - 11/22/20 1022     Subjective Patient reports back pain is better and improving. He states, "She showed me some stretches to do before getting out of bed and that's helping."    Pertinent History Pt is a pleasant 75 y.o. male referred to PT for LBP which occured early February. Pt has a PMH of prostate cx, TIA, chronic a-fib, and recent hematuria. Pt has pain with t/f from STS but  feels better with walking. Pt states his LBP has been improving but his knees and hips have neem giving him more pain. Pt's LBP is a strong ache that is on the R side but denies radicular symptoms. Pt reports exercises given by MD have been beneficial and improving his LBP symptoms. Currently in no pain during subjective. when LBP is flared up he reports 4-5/10 NPS. Worst pain is 5-6/10 NPS but pt is unable to explain what causes worst pain but reports possibly most pain due sitting for long periods of time. Prednisone has improved LBP. Pt reports his goals in therapy are to improve balance although he did not mention to Dr. Sabra [name] when referred to PT for his LBP. Also wants to improve his strength. He reports he requires objects to help support him when walking such as walls or furniture. Pt denies falls, but  spouse reports a fall last fall at home where he tripped over a blanket. Has reported near falls where he needed touching of an object to correct. Pt uses a stick now and then for balance but does report difficulty transferring from sturdy to unstardy surfaces (i.e. stepping over a stream) and walking stick helps.    Currently in Pain? No/denies  INTERVENTIONS  Warm up on Crosstrainer, seat #15, level 7 x4 min (unbilled); Instructed patient in advanced balance exercise:   Standing in parallel bars: On BOSU (round side up):  Forward lunges x10 reps each  Forward step ups x10 reps each  Feet together:   Unsupported standing 30 sec hold x2 reps with min A for safety   Unsupported, ball pass side/side x5 reps with min-mod A and moderate instability noted; Patient required cues for neutral weight shift and improve core stabilization for better trunk control;   Airex beam: -tandem gait with intermittent rail assist x6 laps - standing to side with feet apart: Alternate UE finger taps to target to challenge reaching outside base of support  Patient exhibits increased ankle  instability on BOSU requiring min-mod A while standing unsupported. Patient exhibits decreased ankle strategies while on compliant surface with increased instability noted; Patient required min VCS to improve core stabilization and trunk control while on compliant surface for better stance control;                      PT Education - 11/22/20 1022     Education Details balance/strengthening;    Person(s) Educated Patient    Methods Explanation;Verbal cues    Comprehension Verbalized understanding;Returned demonstration;Verbal cues required;Need further instruction              PT Short Term Goals - 11/22/20 1023       PT SHORT TERM GOAL #1   Title Patient will be independent in home exercise program to improve strength/mobility for better functional independence with ADLs.    Baseline 3/3: established HEP today. See medbridge access code; 06/07/2020 pt reports doing some of his HEP, but that he has difficulty with some of the supine exercises; 4/28: pt not yet fully indep. with HEP; 5/10: pt inconsistently performing HEP; 6/20: doing HEP 3x a week; 9/15: has been walking 30 min 3-5 days a week;    Time 4    Period Weeks    Status Achieved    Target Date 08/02/20               PT Long Term Goals - 11/22/20 1024       PT LONG TERM GOAL #1   Title Patient will increase FOTO score equal to or greater than 69 to demonstrate statistically significant improvement in mobility and quality of life.    Baseline 3/3: 55; 3/31 59%; 4/28: 60%; 5/9: 56%, 6/20: 63%; 7/21: 53% (impacted by pt recurrent onset of LBP after lifting heavy furniture); 9/1: 58%    Time 8    Period Weeks    Status On-going    Target Date 01/17/21      PT LONG TERM GOAL #2   Title Patient (> 48 years old) will complete five times sit to stand test in < 12 seconds indicating an increased LE strength and improved balance.    Baseline 3/3: 15.38 m/s; 06/07/2020:  11 sec, 6/20: 13.5 sec with arms  across chest; 7/21: 12.3 sec; 9/1: 11.7 sec    Time 8    Period Weeks    Status Achieved    Target Date 01/17/21      PT LONG TERM GOAL #3   Title Pt will improve hip extension AROM > 10 deg bilaterally to normalize reciprocal gait and balance.    Baseline 3/3: R/L 4 deg/3 deg; 3/31 R/L 5 deg/3 deg; 4/28: 5 deg R, 0 deg. L; 4/9: 4-5  deg. ext B, 6/20: 8 degrees bilaterally; 7/21: L hip 8 de.g, R hip 5 deg.; 9/1: L 7 deg., R 9 deg.    Time 8    Period Weeks    Status On-going    Target Date 01/17/21      PT LONG TERM GOAL #4   Title Pt will improve Berg score > 46 to display decreased risk of falls.    Baseline 3/3: Will assess upon MD approval; 06/07/2020 52/56; 4/28: 55/56    Time 8    Period Weeks    Status Achieved    Target Date 01/17/21      PT LONG TERM GOAL #5   Title The patient will be able to maintain at least 10 seconds of SLB on BLEs in order to decrease risk of falls when navigating obstacles, curbs and steps.    Baseline 06/07/2020: pt unable to maintain more than 3 sec of SLB without UE support; 4/28: 15 sec BLEs; 9/1: 9 sec R, 5 sec L    Time 8    Period Weeks    Status Achieved    Target Date 01/17/21      PT LONG TERM GOAL #6   Title Patient will increase dynamic gait index score to >20/24 as to demonstrate reduced fall risk and improved dynamic gait balance for better safety with community/home ambulation.    Baseline 4/28: 17/24; 5/9: 19/24, 6/20: 20/24; 7/21: 22/24    Time 8    Period Weeks    Status Achieved    Target Date 01/17/21                   Plan - 11/22/20 1416     Clinical Impression Statement Patient motivated and participated well within session. He was instructed in advanced balance tasks. Patient does exhibit increased ankle instability with difficulty maintaining balance while on compliant surface (BOSU). He required CGA to min A while unsupported with decreased ankle strategies for balance control. Patient does fatigue with  advanced exercise. He required min VCs for proper exercise technique including to improve core stabilization and trunk control for better stance control. Patient would benefit from additional skilled PT Intervention to improve strength, balance and gait safety; Plan to reduce visits to 1x a week to allow patient more time to work on HEP in effort to work on transitioning to independent exercise program. Patient agreeable.    Personal Factors and Comorbidities Age;Comorbidity 3+;Past/Current Experience    Comorbidities A-fib, lumbar disc disease, prostate cancer, and  history of TIA.    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    Rehab Potential Good    PT Frequency 1x / week    PT Duration 8 weeks    PT Treatment/Interventions ADLs/Self Care Home Management;Biofeedback;Aquatic Therapy;Canalith Repostioning;Cryotherapy;Electrical Stimulation;Iontophoresis 34m/ml Dexamethasone;Moist Heat;Traction;Ultrasound;Gait training;Stair training;DME Instruction;Functional mobility training;Therapeutic activities;Therapeutic exercise;Balance training;Neuromuscular re-education;Patient/family education;Manual techniques;Passive range of motion;Dry needling;Energy conservation;Vestibular;Joint Manipulations    PT Next Visit Plan Prepare for DC: most LT goals met or partially met; update HEP as needed.    PT Home Exercise Plan access code: [email or web address] added quadruped knee flexion stretch and half stretch off a step 7/21, no updates on this date    Consulted and Agree with Plan of Care Patient             Patient will benefit from skilled therapeutic intervention in order to improve the following deficits and  impairments:  Abnormal gait, Decreased balance, Decreased range of motion, Decreased strength, Hypomobility, Impaired  sensation, Pain, Improper body mechanics, Impaired flexibility, Decreased mobility  Visit Diagnosis: Unsteadiness on feet  Other abnormalities of gait and mobility  Muscle weakness (generalized)  Other lack of coordination  Acute low back pain, unspecified back pain laterality, unspecified whether sciatica present     Problem List There are no problems to display for this patient.   Daisha Filosa, PT, DPT 11/22/2020, 2:22 PM  [place] MAIN [facility] SERVICES [address] [place], [name], [address] Phone: [phone]   Fax:  [phone]  Name: Nicholas [name] MRN: [id number] Date of Birth: [birth year]

## 2020-11-27 ENCOUNTER — Ambulatory Visit: Payer: Medicare Other | Admitting: Physical Therapy

## 2020-11-29 ENCOUNTER — Ambulatory Visit: Payer: Medicare Other

## 2020-11-29 ENCOUNTER — Other Ambulatory Visit: Payer: Self-pay

## 2020-11-29 DIAGNOSIS — R2681 Unsteadiness on feet: Secondary | ICD-10-CM | POA: Diagnosis not present

## 2020-11-29 DIAGNOSIS — R278 Other lack of coordination: Secondary | ICD-10-CM

## 2020-11-29 DIAGNOSIS — G8929 Other chronic pain: Secondary | ICD-10-CM

## 2020-11-29 DIAGNOSIS — M545 Low back pain, unspecified: Secondary | ICD-10-CM

## 2020-11-29 DIAGNOSIS — M6281 Muscle weakness (generalized): Secondary | ICD-10-CM

## 2020-11-29 DIAGNOSIS — R2689 Other abnormalities of gait and mobility: Secondary | ICD-10-CM

## 2020-11-29 NOTE — Therapy (Signed)
[place] MAIN [facility] SERVICES [address] [place], [name], [address] Phone: [phone]   Fax:  [phone]  Physical Therapy Treatment  Patient Details  Name: Nicholas [name] MRN: [id number] Date of Birth: 05-11-1945 Referring Provider (PT): Rusty Aus, MD   Encounter Date: 11/29/2020   PT End of Session - 11/29/20 1037     Visit Number 52    Number of Visits 56    Date for PT Re-Evaluation 01/17/21    Authorization Type [employer] Medicare, BCBS supplemental    Authorization Time Period 11/22/20-01/17/21    Progress Note Due on Visit 65    PT Start Time 1016    PT Stop Time 1056    PT Time Calculation (min) 40 min    Equipment Utilized During Treatment Gait belt    Activity Tolerance Patient tolerated treatment well;No increased pain    Behavior During Therapy [facility] for tasks assessed/performed             Past Medical History:  Diagnosis Date   A-fib ([place])    Cancer ([place]) 10/25/2019   Prostate   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   Subjective Assessment - 11/29/20 1032     Subjective Pt doing well today in general, no new updates. HEP and pain stable. Stretches and HEP going well.    Pertinent History Pt is a pleasant 75 y.o. male referred to PT for LBP which occured early February. Pt has a PMH of prostate cx, TIA, chronic a-fib, and recent hematuria. Pt has pain with t/f from STS but feels better with walking. Pt states his LBP has been improving but his knees and hips have  neem giving him more pain. Pt's LBP is a strong ache that is on the R side but denies radicular symptoms. Pt reports exercises given by MD have been beneficial and improving his LBP symptoms. Currently in no pain during subjective. when LBP is flared up he reports 4-5/10 NPS. Worst pain is 5-6/10 NPS but pt is unable to explain what causes worst pain but reports possibly most pain due sitting for long periods of time. Prednisone has improved LBP. Pt reports his goals in therapy are to improve balance although he did not mention to Dr. Sabra [name] when referred to PT for his LBP. Also wants to improve his strength. He reports he requires objects to help support him when walking such as walls or furniture. Pt denies falls, but  spouse reports a fall last fall at home where he tripped over a blanket. Has reported near falls where he needed touching of an object to correct. Pt uses a stick now and then for balance but does report difficulty transferring from sturdy to unstardy surfaces (i.e. stepping over a stream) and walking stick helps.             INTERVENTION THIS DATE:  -overground AMB 977f, mild unsteadiness and signs of frontal plane incoordination on Left midstance  -physioball rollout  forward, red x20 -chair trunk rotation/flexion stretch 2x30sec -5000g RDL to 12" box 1x20 -side stepping with blue band 1x59f bilat, manual facilitation for pure frontal plane movement  5000g RDL to 12" box 1x20 -side stepping with black band [id number], manual facilitation for pure frontal plane movement       PT Education - 11/29/20 1037     Education Details form for exercise    Person(s) Educated Patient    Methods Explanation    Comprehension Verbalized understanding              PT Short Term Goals - 11/22/20 1023       PT SHORT TERM GOAL #1   Title Patient will be independent in home exercise program to improve strength/mobility for better functional independence with ADLs.    Baseline  3/3: established HEP today. See medbridge access code; 06/07/2020 pt reports doing some of his HEP, but that he has difficulty with some of the supine exercises; 4/28: pt not yet fully indep. with HEP; 5/10: pt inconsistently performing HEP; 6/20: doing HEP 3x a week; 9/15: has been walking 30 min 3-5 days a week;    Time 4    Period Weeks    Status Achieved    Target Date 08/02/20               PT Long Term Goals - 11/22/20 1024       PT LONG TERM GOAL #1   Title Patient will increase FOTO score equal to or greater than 69 to demonstrate statistically significant improvement in mobility and quality of life.    Baseline 3/3: 55; 3/31 59%; 4/28: 60%; 5/9: 56%, 6/20: 63%; 7/21: 53% (impacted by pt recurrent onset of LBP after lifting heavy furniture); 9/1: 58%    Time 8    Period Weeks    Status On-going    Target Date 01/17/21      PT LONG TERM GOAL #2   Title Patient (> 6035ears old) will complete five times sit to stand test in < 12 seconds indicating an increased LE strength and improved balance.    Baseline 3/3: 15.38 m/s; 06/07/2020:  11 sec, 6/20: 13.5 sec with arms across chest; 7/21: 12.3 sec; 9/1: 11.7 sec    Time 8    Period Weeks    Status Achieved    Target Date 01/17/21      PT LONG TERM GOAL #3   Title Pt will improve hip extension AROM > 10 deg bilaterally to normalize reciprocal gait and balance.    Baseline 3/3: R/L 4 deg/3 deg; 3/31 R/L 5 deg/3 deg; 4/28: 5 deg R, 0 deg. L; 4/9: 4-5 deg. ext B, 6/20: 8 degrees bilaterally; 7/21: L hip 8 de.g, R hip 5 deg.; 9/1: L 7 deg., R 9 deg.    Time 8    Period Weeks    Status On-going    Target Date 01/17/21      PT LONG TERM GOAL #4   Title Pt will improve Berg score > 46 to display decreased risk of falls.    Baseline 3/3: Will assess upon MD approval; 06/07/2020 52/56; 4/28: 55/56    Time 8    Period Weeks    Status Achieved    Target Date 01/17/21      PT LONG TERM GOAL #5   Title The patient will be able to  maintain at least 10 seconds of SLB on BLEs in order to decrease risk  of falls when navigating obstacles, curbs and steps.    Baseline 06/07/2020: pt unable to maintain more than 3 sec of SLB without UE support; 4/28: 15 sec BLEs; 9/1: 9 sec R, 5 sec L    Time 8    Period Weeks    Status Achieved    Target Date 01/17/21      PT LONG TERM GOAL #6   Title Patient will increase dynamic gait index score to >20/24 as to demonstrate reduced fall risk and improved dynamic gait balance for better safety with community/home ambulation.    Baseline 4/28: 17/24; 5/9: 19/24, 6/20: 20/24; 7/21: 22/24    Time 8    Period Weeks    Status Achieved    Target Date 01/17/21                   Plan - 11/29/20 1039     Clinical Impression Statement Continued with current plan of care as laid out in evaluation and recent prior sessions. Pt remains motivated to advance progress toward goals in order to maximize independence and safety at home. Pt requires high level assistance and cuing for completion of exercises in order to provide adequate level of stimulation and perturbation. Author allows pt as much opportunity as possible to perform independent righting strategies, only stepping in when pt is unable to prevent falling to floor. Pt closely monitored throughout session for safe vitals response and to maximize patient safety during interventions. Pt continues to demonstrate progress toward goals AEB progression of some interventions this date either in volume or intensity.     Personal Factors and Comorbidities Age;Comorbidity 3+;Past/Current Experience    Comorbidities A-fib, lumbar disc disease, prostate cancer, and  history of TIA.    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    Clinical Decision Making Moderate     Rehab Potential Good    PT Frequency 1x / week    PT Duration 8 weeks    PT Treatment/Interventions ADLs/Self Care Home Management;Biofeedback;Aquatic Therapy;Canalith Repostioning;Cryotherapy;Electrical Stimulation;Iontophoresis 4mg /ml Dexamethasone;Moist Heat;Traction;Ultrasound;Gait training;Stair training;DME Instruction;Functional mobility training;Therapeutic activities;Therapeutic exercise;Balance training;Neuromuscular re-education;Patient/family education;Manual techniques;Passive range of motion;Dry needling;Energy conservation;Vestibular;Joint Manipulations    PT Next Visit Plan Prepare for DC: most LT goals met or partially met; update HEP as needed.    PT Home Exercise Plan access code: [email or web address]; added quadruped knee flexion stretch and half stretch off a step 7/21, no updates on this date    Consulted and Agree with Plan of Care Patient             Patient will benefit from skilled therapeutic intervention in order to improve the following deficits and impairments:  Abnormal gait, Decreased balance, Decreased range of motion, Decreased strength, Hypomobility, Impaired sensation, Pain, Improper body mechanics, Impaired flexibility, Decreased mobility  Visit Diagnosis: Chronic bilateral low back pain, unspecified whether sciatica present  Acute low back pain, unspecified back pain laterality, unspecified whether sciatica present  Acute right-sided low back pain without sciatica  Muscle weakness (generalized)  Unsteadiness on feet  Other abnormalities of gait and mobility  Other lack of coordination     Problem List There are no problems to display for this patient.  11:01 AM, 11/29/20 [name], PT, DPT Physical Therapist - [place] [phone]     Virginia C, PT 11/29/2020, 10:46 AM  [place]  [place] MAIN [facility] SERVICES [place], [name], [address] Phone: [phone]   Fax:  [phone]  Name: Nicholas [name] MRN: [id number] Date of Birth: Aug 07, 1945

## 2020-12-03 ENCOUNTER — Ambulatory Visit: Payer: Medicare Other | Admitting: Physical Therapy

## 2020-12-05 ENCOUNTER — Ambulatory Visit: Payer: Medicare Other | Admitting: Physical Therapy

## 2020-12-06 ENCOUNTER — Other Ambulatory Visit: Payer: Self-pay

## 2020-12-06 ENCOUNTER — Ambulatory Visit: Payer: Medicare Other | Admitting: Physical Therapy

## 2020-12-06 ENCOUNTER — Encounter: Payer: Self-pay | Admitting: Physical Therapy

## 2020-12-06 DIAGNOSIS — R2689 Other abnormalities of gait and mobility: Secondary | ICD-10-CM

## 2020-12-06 DIAGNOSIS — R2681 Unsteadiness on feet: Secondary | ICD-10-CM | POA: Diagnosis not present

## 2020-12-06 DIAGNOSIS — M6281 Muscle weakness (generalized): Secondary | ICD-10-CM

## 2020-12-06 NOTE — Therapy (Signed)
[place] MAIN [facility] SERVICES [address] [place], [name], [address] Phone: [phone]   Fax:  [phone]  Physical Therapy Treatment  Patient Details  Name: Nicholas [name] MRN: [id number] Date of Birth: March 14, 1945 Referring Provider (PT): Rusty Aus, MD   Encounter Date: 12/06/2020   PT End of Session - 12/06/20 1012     Visit Number 63    Number of Visits 39    Date for PT Re-Evaluation 01/17/21    Authorization Type [employer] Medicare, BCBS supplemental    Authorization Time Period 11/22/20-01/17/21    Progress Note Due on Visit 19    PT Start Time 1016    PT Stop Time 1100    PT Time Calculation (min) 44 min    Equipment Utilized During Treatment Gait belt    Activity Tolerance Patient tolerated treatment well;No increased pain    Behavior During Therapy [facility] for tasks assessed/performed             Past Medical History:  Diagnosis Date   A-fib ([place])    Cancer (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   Subjective Assessment - 12/06/20 1031     Subjective Pt doing well today in general, no new updates. HEP and pain stable. Stretches and HEP going well. He reports he has been able to do his exercises more when coming to therapy 1x a week; Also reports he has increased walking to 30-40 min daily;    Pertinent History Pt is a pleasant 75 y.o. male referred to PT for LBP which occured early February. Pt has a PMH of prostate cx, TIA, chronic a-fib,  and recent hematuria. Pt has pain with t/f from STS but feels better with walking. Pt states his LBP has been improving but his knees and hips have neem giving him more pain. Pt's LBP is a strong ache that is on the R side but denies radicular symptoms. Pt reports exercises given by MD have been beneficial and improving his LBP symptoms. Currently in no pain during subjective. when LBP is flared up he reports 4-5/10 NPS. Worst pain is 5-6/10 NPS but pt is unable to explain what causes worst pain but reports possibly most pain due sitting for long periods of time. Prednisone has improved LBP. Pt reports his goals in therapy are to improve balance although he did not mention to Dr. Sabra [name] when referred to PT for his LBP. Also wants to improve his strength. He reports he requires objects to help support him when walking such as walls or furniture. Pt denies falls, but  spouse reports a fall last fall at home where he tripped over a blanket. Has reported near falls where he needed touching of an object to correct. Pt uses a stick now and then for balance but does report difficulty transferring from sturdy to unstardy surfaces (i.e. stepping over a stream) and walking stick helps.    Currently in  Pain? No/denies                  INTERVENTIONS  Gait in hallway x  600     feet, fast/slow gait speed change to challenge gait safety and improve cardiovascular challenge with energy conservation;  Patient exhibits increased foot slap and increased difficulty with speed changes having a hard time achieving a quick gait speed.  Standing on rockerboard: Calf stretch 30 sec hold x2 reps;  Seated ankle DF resisted green tband 2x10, partial ROM, requiring mod VCs for proper positioning to isolate ankle DF;    NMR: Instructed patient in advanced balance exercise:  Standing on rockerboard: Forward/backward rock x2 min with 2-1-0 rail assist Side/side rock x2 min with 2-1-0 rail assist Patient exhibits  increased difficulty with side rock with decrease quad control; Standing side way on rockerboard, balancing in neutral:  Heel raises    Standing in parallel bars: On BOSU (round side up):             Forward lunges x10 reps each              Patient exhibits increased ankle instability with balance exercise requiring min A while standing unsupported. Patient exhibits decreased ankle strategies while on compliant surface with increased instability noted; Patient required min VCS to improve core stabilization and trunk control while on compliant surface for better stance control;                         PT Education - 12/06/20 1011     Education Details balance/gait safety;    Person(s) Educated Patient    Methods Explanation;Verbal cues    Comprehension Verbalized understanding;Returned demonstration;Verbal cues required;Need further instruction              PT Short Term Goals - 11/22/20 1023       PT SHORT TERM GOAL #1   Title Patient will be independent in home exercise program to improve strength/mobility for better functional independence with ADLs.    Baseline 3/3: established HEP today. See medbridge access code; 06/07/2020 pt reports doing some of his HEP, but that he has difficulty with some of the supine exercises; 4/28: pt not yet fully indep. with HEP; 5/10: pt inconsistently performing HEP; 6/20: doing HEP 3x a week; 9/15: has been walking 30 min 3-5 days a week;    Time 4    Period Weeks    Status Achieved    Target Date 08/02/20               PT Long Term Goals - 11/22/20 1024       PT LONG TERM GOAL #1   Title Patient will increase FOTO score equal to or greater than 69 to demonstrate statistically significant improvement in mobility and quality of life.    Baseline 3/3: 55; 3/31 59%; 4/28: 60%; 5/9: 56%, 6/20: 63%; 7/21: 53% (impacted by pt recurrent onset of LBP after lifting heavy furniture); 9/1: 58%    Time 8    Period Weeks     Status On-going    Target Date 01/17/21      PT LONG TERM GOAL #2   Title Patient (> 37 years old) will complete five times sit to stand test in < 12 seconds indicating an increased LE strength and improved balance.    Baseline 3/3: 15.38 m/s; 06/07/2020:  11 sec, 6/20: 13.5 sec with arms across chest; 7/21: 12.3 sec; 9/1:  11.7 sec    Time 8    Period Weeks    Status Achieved    Target Date 01/17/21      PT LONG TERM GOAL #3   Title Pt will improve hip extension AROM > 10 deg bilaterally to normalize reciprocal gait and balance.    Baseline 3/3: R/L 4 deg/3 deg; 3/31 R/L 5 deg/3 deg; 4/28: 5 deg R, 0 deg. L; 4/9: 4-5 deg. ext B, 6/20: 8 degrees bilaterally; 7/21: L hip 8 de.g, R hip 5 deg.; 9/1: L 7 deg., R 9 deg.    Time 8    Period Weeks    Status On-going    Target Date 01/17/21      PT LONG TERM GOAL #4   Title Pt will improve Berg score > 46 to display decreased risk of falls.    Baseline 3/3: Will assess upon MD approval; 06/07/2020 52/56; 4/28: 55/56    Time 8    Period Weeks    Status Achieved    Target Date 01/17/21      PT LONG TERM GOAL #5   Title The patient will be able to maintain at least 10 seconds of SLB on BLEs in order to decrease risk of falls when navigating obstacles, curbs and steps.    Baseline 06/07/2020: pt unable to maintain more than 3 sec of SLB without UE support; 4/28: 15 sec BLEs; 9/1: 9 sec R, 5 sec L    Time 8    Period Weeks    Status Achieved    Target Date 01/17/21      PT LONG TERM GOAL #6   Title Patient will increase dynamic gait index score to >20/24 as to demonstrate reduced fall risk and improved dynamic gait balance for better safety with community/home ambulation.    Baseline 4/28: 17/24; 5/9: 19/24, 6/20: 20/24; 7/21: 22/24    Time 8    Period Weeks    Status Achieved    Target Date 01/17/21                   Plan - 12/06/20 1310     Clinical Impression Statement Patient motivated and participated well within session.  He does exhibit increased foot slap and poor ankle strength this session. Instructed patient in seated resisted ankle DF as part of HEP. He does require min Vcs for proper positioning and exercise technique; Instructed patient in advanced balance exercise, utilizing compliant surface to challenge ankle strategies and weight shift. He does exhibit increased difficulty with reduced rail assist requiring min A for safety. Patient reports increased fatigue at end of session. He would benefit from additional skilled PT intervention to improve strength, balance and mobility;    Personal Factors and Comorbidities Age;Comorbidity 3+;Past/Current Experience    Comorbidities A-fib, lumbar disc disease, prostate cancer, and  history of TIA.    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    Rehab Potential Good    PT Frequency 1x / week    PT Duration 8 weeks    PT Treatment/Interventions ADLs/Self Care Home Management;Biofeedback;Aquatic Therapy;Canalith Repostioning;Cryotherapy;Electrical Stimulation;Iontophoresis 9m/ml Dexamethasone;Moist Heat;Traction;Ultrasound;Gait training;Stair training;DME Instruction;Functional mobility training;Therapeutic activities;Therapeutic exercise;Balance training;Neuromuscular re-education;Patient/family education;Manual techniques;Passive range of motion;Dry needling;Energy conservation;Vestibular;Joint Manipulations    PT Next Visit Plan Prepare for DC: most LT goals met or partially met; update HEP as needed.    PT Home Exercise Plan access code: [email or web address] added  quadruped knee flexion stretch and half stretch off a step 7/21, no updates on this date    Consulted and Agree with Plan of Care Patient             Patient will benefit from skilled therapeutic intervention in order to improve the  following deficits and impairments:  Abnormal gait, Decreased balance, Decreased range of motion, Decreased strength, Hypomobility, Impaired sensation, Pain, Improper body mechanics, Impaired flexibility, Decreased mobility  Visit Diagnosis: Muscle weakness (generalized)  Unsteadiness on feet  Other abnormalities of gait and mobility     Problem List There are no problems to display for this patient.   Kimball Manske, PT, DPT 12/06/2020, 1:59 PM  [place] MAIN [facility] SERVICES [address] [place], [name], [address] Phone: [phone]   Fax:  [phone]  Name: Nicholas [name] MRN: [id number] Date of Birth: 10/11/1945

## 2020-12-06 NOTE — Patient Instructions (Signed)
Access Code: [email or web address]: [email or web address]: 09/29/2022Prepared by: Joycelyn Schmid TrotterExercises  Seated Ankle Dorsiflexion with Resistance - 1 x daily - 7 x weekly - 2 sets - 10 reps

## 2020-12-10 ENCOUNTER — Ambulatory Visit: Payer: Medicare Other | Admitting: Physical Therapy

## 2020-12-13 ENCOUNTER — Other Ambulatory Visit: Payer: Self-pay

## 2020-12-13 ENCOUNTER — Ambulatory Visit: Payer: Medicare Other | Attending: Internal Medicine

## 2020-12-13 DIAGNOSIS — R2689 Other abnormalities of gait and mobility: Secondary | ICD-10-CM | POA: Diagnosis present

## 2020-12-13 DIAGNOSIS — R2681 Unsteadiness on feet: Secondary | ICD-10-CM | POA: Diagnosis present

## 2020-12-13 DIAGNOSIS — M6281 Muscle weakness (generalized): Secondary | ICD-10-CM | POA: Insufficient documentation

## 2020-12-13 DIAGNOSIS — M545 Low back pain, unspecified: Secondary | ICD-10-CM | POA: Diagnosis not present

## 2020-12-13 NOTE — Therapy (Signed)
[place] MAIN [facility] SERVICES [address] [place], [name], [address] Phone: [phone]   Fax:  [phone]  Physical Therapy Treatment  Patient Details  Name: Nicholas [name] MRN: [id number] Date of Birth: 03-Jul-1945 Referring Provider (PT): Rusty Aus, MD   Encounter Date: 12/13/2020   PT End of Session - 12/13/20 1946     Visit Number 54    Number of Visits 62    Date for PT Re-Evaluation 01/17/21    Authorization Type [employer] Medicare, BCBS supplemental    Authorization Time Period 11/22/20-01/17/21    Progress Note Due on Visit 40    PT Start Time 1102    PT Stop Time 1146    PT Time Calculation (min) 44 min    Equipment Utilized During Treatment Gait belt    Activity Tolerance Patient tolerated treatment well;No increased pain    Behavior During Therapy [facility] for tasks assessed/performed             Past Medical History:  Diagnosis Date   A-fib ([place])    Cancer ([place]) 10/25/2019   Prostate   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   Subjective Assessment - 12/13/20 1106     Subjective Pt reports increased LBP after working a couple hours  in his yard yesterday raking leaves.    Pertinent History Pt is a pleasant 75 y.o. male referred to PT for LBP which occured early February. Pt has a PMH of prostate cx, TIA, chronic a-fib, and recent hematuria. Pt has pain with t/f from STS but feels better with walking. Pt states his LBP has been improving but his knees and hips have neem  giving him more pain. Pt's LBP is a strong ache that is on the R side but denies radicular symptoms. Pt reports exercises given by MD have been beneficial and improving his LBP symptoms. Currently in no pain during subjective. when LBP is flared up he reports 4-5/10 NPS. Worst pain is 5-6/10 NPS but pt is unable to explain what causes worst pain but reports possibly most pain due sitting for long periods of time. Prednisone has improved LBP. Pt reports his goals in therapy are to improve balance although he did not mention to Dr. Sabra [name] when referred to PT for his LBP. Also wants to improve his strength. He reports he requires objects to help support him when walking such as walls or furniture. Pt denies falls, but  spouse reports a fall last fall at home where he tripped over a blanket. Has reported near falls where he needed touching of an object to correct. Pt uses a stick now and then for balance but does report difficulty transferring from sturdy to unstardy surfaces (i.e. stepping over a stream) and walking stick helps.    Currently in Pain? Yes    Pain Location Back    Pain Orientation Lower              Interventions:  Seated  hamstring stretch -2 x 30 seconds bilateral lower extremities Seated FABER stretch -2 x 30 seconds bilateral lower extremities Ankle ABCs -1 round each lower extremity Seated, green Thera-Band plantar flexion -2 x 15 bilateral lower extremities Seated, green Thera-Band ankle E version 1 x 16 bilateral lower extremities Standing, with bilateral upper extremity support gastrocsoleus stretch -2 x 30 seconds bilateral lower extremities  Seated red physioball roll outs forward and backwards -multiple repetitions with 3-second holds Seated red physioball roll outs side to side -multiple repetitions with 3-second holds at end range Comments: Physioball roll outs performed within pain free or pain limited ranges (below 3 out of 10 pain).  At matrix cable machine with  standby assist: Patient performs sidestepping antirotation walkouts -7.5 pounds 5 x 15 feet each side.  Patient reports fatigue.  PT provides verbal cues and demo for exercise technique.  Performs more seated red physioball roll outs forward and backward and side to side following antirotation exercise -10 times for each in each direction.  Reports feels good on his low back.  Patient performs sit to stands hands-free -2 x 6  Pt educated throughout session about proper posture and technique with exercises. Improved exercise technique, movement at target joints, use of target muscles after min to mod verbal, visual, tactile cues.   note: Portions of this document were prepared using Dragon voice recognition software and although reviewed may contain unintentional dictation errors in syntax, grammar, or spelling.    PT Education - 12/13/20 1945     Education Details Exercise technique, body mechanics    Person(s) Educated Patient    Methods Explanation;Demonstration;Verbal cues    Comprehension Verbalized understanding;Returned demonstration;Verbal cues required;Need further instruction              PT Short Term Goals - 11/22/20 1023       PT SHORT TERM GOAL #1   Title Patient will be independent in home exercise program to improve strength/mobility for better functional independence with ADLs.    Baseline 3/3: established HEP today. See medbridge access code; 06/07/2020 pt reports doing some of his HEP, but that he has difficulty with some of the supine exercises; 4/28: pt not yet fully indep. with HEP; 5/10: pt inconsistently performing HEP; 6/20: doing HEP 3x a week; 9/15: has been walking 30 min 3-5 days a week;    Time 4    Period Weeks    Status Achieved    Target Date 08/02/20               PT Long Term Goals - 11/22/20 1024       PT LONG TERM GOAL #1   Title Patient will increase FOTO score equal to or greater than 69 to demonstrate statistically significant  improvement in mobility and quality of life.    Baseline 3/3: 55; 3/31 59%; 4/28: 60%; 5/9: 56%, 6/20: 63%; 7/21: 53% (impacted by pt recurrent onset of LBP after lifting heavy furniture); 9/1: 58%    Time 8    Period Weeks    Status On-going    Target Date 01/17/21      PT LONG TERM GOAL #2   Title Patient (> 49 years old) will complete five times sit to stand test in < 12 seconds indicating an increased LE strength and improved balance.    Baseline 3/3: 15.38 m/s; 06/07/2020:  11 sec, 6/20: 13.5 sec with arms across chest; 7/21: 12.3 sec; 9/1: 11.7 sec    Time 8  Period Weeks    Status Achieved    Target Date 01/17/21      PT LONG TERM GOAL #3   Title Pt will improve hip extension AROM > 10 deg bilaterally to normalize reciprocal gait and balance.    Baseline 3/3: R/L 4 deg/3 deg; 3/31 R/L 5 deg/3 deg; 4/28: 5 deg R, 0 deg. L; 4/9: 4-5 deg. ext B, 6/20: 8 degrees bilaterally; 7/21: L hip 8 de.g, R hip 5 deg.; 9/1: L 7 deg., R 9 deg.    Time 8    Period Weeks    Status On-going    Target Date 01/17/21      PT LONG TERM GOAL #4   Title Pt will improve Berg score > 46 to display decreased risk of falls.    Baseline 3/3: Will assess upon MD approval; 06/07/2020 52/56; 4/28: 55/56    Time 8    Period Weeks    Status Achieved    Target Date 01/17/21      PT LONG TERM GOAL #5   Title The patient will be able to maintain at least 10 seconds of SLB on BLEs in order to decrease risk of falls when navigating obstacles, curbs and steps.    Baseline 06/07/2020: pt unable to maintain more than 3 sec of SLB without UE support; 4/28: 15 sec BLEs; 9/1: 9 sec R, 5 sec L    Time 8    Period Weeks    Status Achieved    Target Date 01/17/21      PT LONG TERM GOAL #6   Title Patient will increase dynamic gait index score to >20/24 as to demonstrate reduced fall risk and improved dynamic gait balance for better safety with community/home ambulation.    Baseline 4/28: 17/24; 5/9: 19/24, 6/20:  20/24; 7/21: 22/24    Time 8    Period Weeks    Status Achieved    Target Date 01/17/21                   Plan - 12/13/20 1947     Clinical Impression Statement Patient continues to be highly motivated and participates well within session.  Beginning of session focused on pain modulation for recent increase in low back pain after patient reports raking leaves for couple hours in his yard.  Patient does report some relief in symptoms following interventions.  Interventions also focused on ankle strengthening, with addition of antirotation core strengthening on this date.  The patient will benefit from further skilled physical therapy to improve strength, balance, and mobility.    Personal Factors and Comorbidities Age;Comorbidity 3+;Past/Current Experience    Comorbidities A-fib, lumbar disc disease, prostate cancer, and  history of TIA.    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    Rehab Potential Good    PT Frequency 1x / week    PT Duration 8 weeks    PT Treatment/Interventions ADLs/Self Care Home Management;Biofeedback;Aquatic Therapy;Canalith Repostioning;Cryotherapy;Electrical Stimulation;Iontophoresis 44m/ml Dexamethasone;Moist Heat;Traction;Ultrasound;Gait training;Stair training;DME Instruction;Functional mobility training;Therapeutic activities;Therapeutic exercise;Balance training;Neuromuscular re-education;Patient/family education;Manual techniques;Passive range of motion;Dry needling;Energy conservation;Vestibular;Joint Manipulations    PT Next Visit Plan Prepare for DC: most LT goals met or partially met; update HEP as needed.    PT Home Exercise Plan access code: [email or web address] added quadruped knee flexion stretch and half stretch off a step 7/21, no updates on this date    Consulted and Agree  with  Plan of Care Patient             Patient will benefit from skilled therapeutic intervention in order to improve the following deficits and impairments:  Abnormal gait, Decreased balance, Decreased range of motion, Decreased strength, Hypomobility, Impaired sensation, Pain, Improper body mechanics, Impaired flexibility, Decreased mobility  Visit Diagnosis: Acute low back pain, unspecified back pain laterality, unspecified whether sciatica present  Muscle weakness (generalized)  Unsteadiness on feet     Problem List There are no problems to display for this patient.   Zollie Pee, PT 12/13/2020, 7:57 PM  [place] MAIN [facility] SERVICES [address] [place], [name], [address] Phone: [phone]   Fax:  [phone]  Name: ZAKEE DEERMAN MRN: [id number] Date of Birth: 1945-08-21

## 2020-12-17 ENCOUNTER — Ambulatory Visit: Payer: Medicare Other

## 2020-12-20 ENCOUNTER — Ambulatory Visit: Payer: Medicare Other

## 2020-12-20 ENCOUNTER — Other Ambulatory Visit: Payer: Self-pay

## 2020-12-20 DIAGNOSIS — M6281 Muscle weakness (generalized): Secondary | ICD-10-CM

## 2020-12-20 DIAGNOSIS — M545 Low back pain, unspecified: Secondary | ICD-10-CM | POA: Diagnosis not present

## 2020-12-20 DIAGNOSIS — R2689 Other abnormalities of gait and mobility: Secondary | ICD-10-CM

## 2020-12-20 DIAGNOSIS — R2681 Unsteadiness on feet: Secondary | ICD-10-CM

## 2020-12-20 NOTE — Therapy (Signed)
[place] MAIN [facility] SERVICES [address] [place], [name], [address] Phone: [phone]   Fax:  [phone]  Physical Therapy Treatment  Patient Details  Name: Nicholas [name] MRN: [id number] Date of Birth: [birth year] Referring Provider (PT): Rusty Aus, MD   Encounter Date: 12/20/2020   PT End of Session - 12/20/20 1420     Visit Number 55    Number of Visits 43    Date for PT Re-Evaluation 01/17/21    Authorization Type [employer], BCBS supplemental    Authorization Time Period 11/22/20-01/17/21    Progress Note Due on Visit 10    PT Start Time 1101    PT Stop Time 1147    PT Time Calculation (min) 46 min    Equipment Utilized During Treatment Gait belt    Activity Tolerance Patient tolerated treatment well;No increased pain    Behavior During Therapy [facility] for tasks assessed/performed             Past Medical History:  Diagnosis Date   A-fib ([place])    Cancer ([place]) 10/25/2019   Prostate   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   Subjective Assessment - 12/20/20 1104     Subjective Pt reports he is walking more and feeling less stable. He says he must hold onto something when getting up from bed or the chair. He reports "my back doesn't bother me as bad."    Pertinent History Pt is a pleasant 75 y.o. male referred to PT for LBP which occured early February. Pt has a PMH of prostate cx, TIA, chronic a-fib, and recent hematuria. Pt has pain with t/f from STS but feels better  with walking. Pt states his LBP has been improving but his knees and hips have neem giving him more pain. Pt's LBP is a strong ache that is on the R side but denies radicular symptoms. Pt reports exercises given by MD have been beneficial and improving his LBP symptoms. Currently in no pain during subjective. when LBP is flared up he reports 4-5/10 NPS. Worst pain is 5-6/10 NPS but pt is unable to explain what causes worst pain but reports possibly most pain due sitting for long periods of time. Prednisone has improved LBP. Pt reports his goals in therapy are to improve balance although he did not mention to Dr. Sabra [name] when referred to PT for his LBP. Also wants to improve his strength. He reports he requires objects to help support him when walking such as walls or furniture. Pt denies falls, but  spouse reports a fall last fall at home where he tripped over a blanket. Has reported near falls where he needed touching of an object to correct. Pt uses a stick now and then for balance but does report difficulty transferring from sturdy to unstardy surfaces (i.e. stepping over a stream) and walking stick helps.    Currently in Pain? No/denies  Interventions:  STS - 10x, progressed to performing with 3000 gr ball for 10x  Treadmill for lower extremity muscle and cardiorespiratory endurance.  The patient ambulates up to 1.8 mph for 5 min 30 sec. Rates as a moderate challenge but reports bilateral lower extremity fatigue.  Requires cueing throughout to improve upright posture and rely less on upper extremity weightbearing on handlebars.  Seated hamstring stretch 2x30 sec Banding cath stretch on stairs with bilateral upper extremity support on stair rails -2 x 30 seconds At support surface with bilateral upper extremity support on bar, contact-guard assist, patient performs ankle rockers on rocker board -2 x 10.  Patient with difficulty performing dorsiflexion without compensating with momentum  and forward lean.  Seated ankle rockers for DF/PF 2 x 15.  Patient exhibits decreased active range of motion and reports fatigue   At support bar with contact-guard assist: Patient standing on Airex pad with dynamic task of throwing basketball into.  Patient performed this with the following lower extremity positioning: -NBOS x 2 rounds -Semitandem by 1 rounds for each lower extremity  Single-leg balance 2 x 30 seconds each lower extremity.  Patient intermittently able to maintain balance for 3 to 5 seconds before requiring upper extremity support on bar.   Pt educated throughout session about proper posture and technique with exercises. Improved exercise technique, movement at target joints, use of target muscles after min to mod verbal, visual, tactile cues.    note: Portions of this document were prepared using Dragon voice recognition software and although reviewed may contain unintentional dictation errors in syntax, grammar, or spelling.     PT Education - 12/20/20 1419     Education Details Exercise technique, body mechanics    Person(s) Educated Patient    Methods Explanation;Demonstration;Verbal cues    Comprehension Verbalized understanding;Returned demonstration;Need further instruction;Verbal cues required              PT Short Term Goals - 11/22/20 1023       PT SHORT TERM GOAL #1   Title Patient will be independent in home exercise program to improve strength/mobility for better functional independence with ADLs.    Baseline 3/3: established HEP today. See medbridge access code; 06/07/2020 pt reports doing some of his HEP, but that he has difficulty with some of the supine exercises; 4/28: pt not yet fully indep. with HEP; 5/10: pt inconsistently performing HEP; 6/20: doing HEP 3x a week; 9/15: has been walking 30 min 3-5 days a week;    Time 4    Period Weeks    Status Achieved    Target Date 08/02/20               PT Long Term Goals - 11/22/20 1024        PT LONG TERM GOAL #1   Title Patient will increase FOTO score equal to or greater than 69 to demonstrate statistically significant improvement in mobility and quality of life.    Baseline 3/3: 55; 3/31 59%; 4/28: 60%; 5/9: 56%, 6/20: 63%; 7/21: 53% (impacted by pt recurrent onset of LBP after lifting heavy furniture); 9/1: 58%    Time 8    Period Weeks    Status On-going    Target Date 01/17/21      PT LONG TERM GOAL #2   Title Patient (> 13 years old) will complete five times sit to stand test in < 12 seconds indicating an increased LE strength and improved balance.    Baseline 3/3: 15.38  m/s; 06/07/2020:  11 sec, 6/20: 13.5 sec with arms across chest; 7/21: 12.3 sec; 9/1: 11.7 sec    Time 8    Period Weeks    Status Achieved    Target Date 01/17/21      PT LONG TERM GOAL #3   Title Pt will improve hip extension AROM > 10 deg bilaterally to normalize reciprocal gait and balance.    Baseline 3/3: R/L 4 deg/3 deg; 3/31 R/L 5 deg/3 deg; 4/28: 5 deg R, 0 deg. L; 4/9: 4-5 deg. ext B, 6/20: 8 degrees bilaterally; 7/21: L hip 8 de.g, R hip 5 deg.; 9/1: L 7 deg., R 9 deg.    Time 8    Period Weeks    Status On-going    Target Date 01/17/21      PT LONG TERM GOAL #4   Title Pt will improve Berg score > 46 to display decreased risk of falls.    Baseline 3/3: Will assess upon MD approval; 06/07/2020 52/56; 4/28: 55/56    Time 8    Period Weeks    Status Achieved    Target Date 01/17/21      PT LONG TERM GOAL #5   Title The patient will be able to maintain at least 10 seconds of SLB on BLEs in order to decrease risk of falls when navigating obstacles, curbs and steps.    Baseline 06/07/2020: pt unable to maintain more than 3 sec of SLB without UE support; 4/28: 15 sec BLEs; 9/1: 9 sec R, 5 sec L    Time 8    Period Weeks    Status Achieved    Target Date 01/17/21      PT LONG TERM GOAL #6   Title Patient will increase dynamic gait index score to >20/24 as to demonstrate reduced  fall risk and improved dynamic gait balance for better safety with community/home ambulation.    Baseline 4/28: 17/24; 5/9: 19/24, 6/20: 20/24; 7/21: 22/24    Time 8    Period Weeks    Status Achieved    Target Date 01/17/21                   Plan - 12/20/20 1426     Clinical Impression Statement Treadmill for cardiorespiratory and lower extremity muscular endurance added as intervention today as patient reports decreased balance with prolonged walking and standing with lower extremity fatigue.  Patient did exhibit decreased endurance on treadmill, ambulating up to 1.8 mph for 5 minutes and 30 seconds rating as a moderate challenge and fatiguing.  Patient did show some progress with single-leg balance, he was able to maintain single-leg balance for 3 to 5 seconds intermittently.  The patient will benefit from further skilled physical therapy to improve strength, balance, and mobility in order to decrease fall risk and improve quality of life.    Personal Factors and Comorbidities Age;Comorbidity 3+;Past/Current Experience    Comorbidities A-fib, lumbar disc disease, prostate cancer, and  history of TIA.    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    Rehab Potential Good    PT Frequency 1x / week    PT Duration 8 weeks    PT Treatment/Interventions ADLs/Self Care Home Management;Biofeedback;Aquatic Therapy;Canalith Repostioning;Cryotherapy;Electrical Stimulation;Iontophoresis 4m/ml Dexamethasone;Moist Heat;Traction;Ultrasound;Gait training;Stair training;DME Instruction;Functional mobility training;Therapeutic activities;Therapeutic exercise;Balance training;Neuromuscular re-education;Patient/family education;Manual techniques;Passive range of motion;Dry needling;Energy conservation;Vestibular;Joint  Manipulations    PT Next Visit Plan  Prepare for DC: most LT goals met or partially met; update HEP as needed.  Addition of endurance exercise on treadmill    PT Home Exercise Plan access code: [email or web address]; added quadruped knee flexion stretch and half stretch off a step 7/21, no updates on this date    Consulted and Agree with Plan of Care Patient             Patient will benefit from skilled therapeutic intervention in order to improve the following deficits and impairments:  Abnormal gait, Decreased balance, Decreased range of motion, Decreased strength, Hypomobility, Impaired sensation, Pain, Improper body mechanics, Impaired flexibility, Decreased mobility  Visit Diagnosis: Unsteadiness on feet  Muscle weakness (generalized)  Other abnormalities of gait and mobility     Problem List There are no problems to display for this patient.   Zollie Pee, PT 12/20/2020, 2:31 PM  [place] MAIN [facility] SERVICES [address] [place], [name], [address] Phone: [phone]   Fax:  [phone]  Name: JESTEN CAPPUCCIO MRN: [id number] Date of Birth: [birth year]

## 2020-12-24 ENCOUNTER — Ambulatory Visit: Payer: Medicare Other

## 2020-12-27 ENCOUNTER — Other Ambulatory Visit: Payer: Self-pay

## 2020-12-27 ENCOUNTER — Ambulatory Visit (INDEPENDENT_AMBULATORY_CARE_PROVIDER_SITE_OTHER): Payer: Medicare Other | Admitting: Dermatology

## 2020-12-27 DIAGNOSIS — L219 Seborrheic dermatitis, unspecified: Secondary | ICD-10-CM

## 2020-12-27 DIAGNOSIS — L570 Actinic keratosis: Secondary | ICD-10-CM

## 2020-12-27 DIAGNOSIS — L578 Other skin changes due to chronic exposure to nonionizing radiation: Secondary | ICD-10-CM | POA: Diagnosis not present

## 2020-12-27 DIAGNOSIS — L820 Inflamed seborrheic keratosis: Secondary | ICD-10-CM

## 2020-12-27 DIAGNOSIS — Z1283 Encounter for screening for malignant neoplasm of skin: Secondary | ICD-10-CM

## 2020-12-27 DIAGNOSIS — L814 Other melanin hyperpigmentation: Secondary | ICD-10-CM

## 2020-12-27 DIAGNOSIS — D229 Melanocytic nevi, unspecified: Secondary | ICD-10-CM

## 2020-12-27 DIAGNOSIS — D1801 Hemangioma of skin and subcutaneous tissue: Secondary | ICD-10-CM

## 2020-12-27 DIAGNOSIS — L918 Other hypertrophic disorders of the skin: Secondary | ICD-10-CM

## 2020-12-27 DIAGNOSIS — L821 Other seborrheic keratosis: Secondary | ICD-10-CM

## 2020-12-27 NOTE — Progress Notes (Signed)
Follow-Up Visit   Subjective  Nicholas [name] is a 75 y.o. male who presents for the following: Follow-up (Patient here today for tbse. Patient reports that he has a itchy spot at right back. Patient also reports a spot at nose that he has concerns about. Patient reports that he has a few spots at face, arms and hands. ).  The following portions of the chart were reviewed this encounter and updated as appropriate:  Tobacco  Allergies  Meds  Problems  Med Hx  Surg Hx  Fam Hx     Review of Systems: No other skin or systemic complaints except as noted in HPI or Assessment and Plan.  Objective  Well appearing patient in no apparent distress; mood and affect are within normal limits.  A full examination was performed including scalp, head, eyes, ears, nose, lips, neck, chest, axillae, abdomen, back, buttocks, bilateral upper extremities, bilateral lower extremities, hands, feet, fingers, toes, fingernails, and toenails. All findings within normal limits unless otherwise noted below.  left nose x 1, face and neck x 7 (8) Erythematous thin papules/macules with gritty scale.   neck / back x 5, scalp x 2 (7) Erythematous keratotic or waxy stuck-on papule or plaque.   Face and Postauricular Pink patches with greasy scale.   Assessment & Plan  Actinic keratosis (8) left nose x 1, face and neck x 7  Actinic keratoses are precancerous spots that appear secondary to cumulative UV radiation exposure/sun exposure over time. They are chronic with expected duration over 1 year. A portion of actinic keratoses will progress to squamous cell carcinoma of the skin. It is not possible to reliably predict which spots will progress to skin cancer and so treatment is recommended to prevent development of skin cancer.  Recommend daily broad spectrum sunscreen SPF 30+ to sun-exposed areas, reapply every 2 hours as needed.  Recommend staying in the shade or wearing long sleeves, sun glasses (UVA+UVB  protection) and wide brim hats (4-inch brim around the entire circumference of the hat). Call for new or changing lesions.  Recheck spot at nose in  3 month follow   Destruction of lesion - left nose x 1, face and neck x 7 Complexity: simple   Destruction method: cryotherapy   Informed consent: discussed and consent obtained   Timeout:  patient name, date of birth, surgical site, and procedure verified Lesion destroyed using liquid nitrogen: Yes   Region frozen until ice ball extended beyond lesion: Yes   Outcome: patient tolerated procedure well with no complications   Post-procedure details: wound care instructions given    Inflamed seborrheic keratosis neck / back x 5, scalp x 2  Destruction of lesion - neck / back x 5, scalp x 2 Complexity: simple   Destruction method: cryotherapy   Informed consent: discussed and consent obtained   Timeout:  patient name, date of birth, surgical site, and procedure verified Lesion destroyed using liquid nitrogen: Yes   Region frozen until ice ball extended beyond lesion: Yes   Outcome: patient tolerated procedure well with no complications   Post-procedure details: wound care instructions given    Seborrheic dermatitis Face and Postauricular  Seborrheic Dermatitis  -  is a chronic persistent rash characterized by pinkness and scaling most commonly of the mid face but also can occur on the scalp (dandruff), ears; mid chest, mid back and groin.  It tends to be exacerbated by stress and cooler weather.  People who have neurologic disease may experience new  onset or exacerbation of existing seborrheic dermatitis.  The condition is not curable but treatable and can be controlled.  Continue Ketoconazole 2 % cream apply topically daily to affected areas of face on Monday Wednesday and Friday.  Continue hydrocortisone 2.5 % cream - apply topically to affected areas of face daily on Tuesday, Thursday, Saturday,    Related Medications ketoconazole  (NIZORAL) 2 % cream Apply 1 application topically daily. Monday, Wednesday, Friday  hydrocortisone 2.5 % cream Apply topically daily. Tuesday, Thursday, Saturday  Skin cancer screening  Lentigines - Scattered tan macules - Due to sun exposure - Benign-appearing, observe - Recommend daily broad spectrum sunscreen SPF 30+ to sun-exposed areas, reapply every 2 hours as needed. - Call for any changes  Acrochordons (Skin Tags) - Fleshy, skin-colored pedunculated papules - Benign appearing.  - Observe. - If desired, they can be removed with an in office procedure that is not covered by insurance. - Please call the clinic if you notice any new or changing lesions.  Seborrheic Keratoses - Stuck-on, waxy, tan-brown papules and/or plaques at scalp - Benign-appearing - Discussed benign etiology and prognosis. - Observe - Call for any changes  Melanocytic Nevi - Tan-brown and/or pink-flesh-colored symmetric macules and papules - Benign appearing on exam today - Observation - Call clinic for new or changing moles - Recommend daily use of broad spectrum spf 30+ sunscreen to sun-exposed areas.   Hemangiomas - Red papules - Discussed benign nature - Observe - Call for any changes  Actinic Damage - Chronic condition, secondary to cumulative UV/sun exposure - diffuse scaly erythematous macules with underlying dyspigmentation - Recommend daily broad spectrum sunscreen SPF 30+ to sun-exposed areas, reapply every 2 hours as needed.  - Staying in the shade or wearing long sleeves, sun glasses (UVA+UVB protection) and wide brim hats (4-inch brim around the entire circumference of the hat) are also recommended for sun protection.  - Call for new or changing lesions.  Skin cancer screening performed today.  Return for 3 month , ak followup. IRuthell Rummage, CMA, am acting as scribe for Sarina Ser, MD. Documentation: I have reviewed the above documentation for accuracy and  completeness, and I agree with the above.  Sarina Ser, MD

## 2020-12-27 NOTE — Patient Instructions (Addendum)
Use ketoconazole and hydrocortisone at face for seborrheic dermatitis      Actinic keratoses are precancerous spots that appear secondary to cumulative UV radiation exposure/sun exposure over time. They are chronic with expected duration over 1 year. A portion of actinic keratoses will progress to squamous cell carcinoma of the skin. It is not possible to reliably predict which spots will progress to skin cancer and so treatment is recommended to prevent development of skin cancer.  Recommend daily broad spectrum sunscreen SPF 30+ to sun-exposed areas, reapply every 2 hours as needed.  Recommend staying in the shade or wearing long sleeves, sun glasses (UVA+UVB protection) and wide brim hats (4-inch brim around the entire circumference of the hat). Call for new or changing lesions.   Cryotherapy Aftercare  Wash gently with soap and water everyday.   Apply Vaseline and Band-Aid daily until healed.   If you have any questions or concerns for your doctor, please call our main line at [phone] and press option 4 to reach your doctor's medical assistant. If no one answers, please leave a voicemail as directed and we will return your call as soon as possible. Messages left after 4 pm will be answered the following business day.   You may also send [place] a message via [place]. We typically respond to MyChart messages within 1-2 business days.  For prescription refills, please ask your pharmacy to contact our office. Our fax number is [phone].  If you have an urgent issue when the clinic is closed that cannot wait until the next business day, you can page your doctor at the number below.    Please note that while we do our best to be available for urgent issues outside of office hours, we are not available 24/7.   If you have an urgent issue and are unable to reach [place], you may choose to seek medical care at your doctor's office, retail clinic, urgent care center, or emergency room.  If you  have a medical emergency, please immediately call 911 or go to the emergency department.  Pager Numbers  - Dr. Nehemiah Massed: [phone]  - Dr. Laurence Ferrari: [phone]  - Dr. Nicole Kindred: [phone]  In the event of inclement weather, please call our main line at [phone] for an update on the status of any delays or closures.  Dermatology Medication Tips: Please keep the boxes that topical medications come in in order to help keep track of the instructions about where and how to use these. Pharmacies typically print the medication instructions only on the boxes and not directly on the medication tubes.   If your medication is too expensive, please contact our office at [phone] option 4 or send [place] a message through [place].   We are unable to tell what your co-pay for medications will be in advance as this is different depending on your insurance coverage. However, we may be able to find a substitute medication at lower cost or fill out paperwork to get insurance to cover a needed medication.   If a prior authorization is required to get your medication covered by your insurance company, please allow [place] 1-2 business days to complete this process.  Drug prices often vary depending on where the prescription is filled and some pharmacies may offer cheaper prices.  The website www.goodrx.com contains coupons for medications through different pharmacies. The prices here do not account for what the cost may be with help from insurance (it may be cheaper with your insurance), but the website can  give you the price if you did not use any insurance.  - You can print the associated coupon and take it with your prescription to the pharmacy.  - You may also stop by our office during regular business hours and pick up a GoodRx coupon card.  - If you need your prescription sent electronically to a different pharmacy, notify our office through [facility] or by phone at [phone] option 4.

## 2020-12-31 ENCOUNTER — Ambulatory Visit: Payer: Medicare Other

## 2021-01-01 ENCOUNTER — Encounter: Payer: Self-pay | Admitting: Dermatology

## 2021-01-03 ENCOUNTER — Other Ambulatory Visit: Payer: Self-pay

## 2021-01-03 ENCOUNTER — Ambulatory Visit: Payer: Medicare Other

## 2021-01-03 DIAGNOSIS — M545 Low back pain, unspecified: Secondary | ICD-10-CM | POA: Diagnosis not present

## 2021-01-03 DIAGNOSIS — M6281 Muscle weakness (generalized): Secondary | ICD-10-CM

## 2021-01-03 DIAGNOSIS — R2681 Unsteadiness on feet: Secondary | ICD-10-CM

## 2021-01-03 DIAGNOSIS — R2689 Other abnormalities of gait and mobility: Secondary | ICD-10-CM

## 2021-01-03 NOTE — Therapy (Signed)
[place] MAIN [facility] SERVICES [address] [place], [name], [address] Phone: [phone]   Fax:  [phone]  Physical Therapy Treatment  Patient Details  Name: Nicholas [name] MRN: [id number] Date of Birth: 1945/07/31 Referring Provider (PT): Rusty Aus, MD   Encounter Date: 01/03/2021   PT End of Session - 01/03/21 1108     Visit Number 56    Number of Visits 76    Date for PT Re-Evaluation 01/17/21    Authorization Type [employer], BCBS supplemental    Authorization Time Period 11/22/20-01/17/21    Progress Note Due on Visit 65    PT Start Time 1103    PT Stop Time 1146    PT Time Calculation (min) 43 min    Equipment Utilized During Treatment Gait belt    Activity Tolerance Patient tolerated treatment well;No increased pain    Behavior During Therapy [facility] for tasks assessed/performed             Past Medical History:  Diagnosis Date   A-fib ([place])    Cancer ([place]) 10/25/2019   Prostate   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   Subjective Assessment - 01/03/21 1105     Subjective Pt reports he has had some increased back pain recently. Reports pain increases with cleaning himself after using the bathroom. Rates his pain as 3-4/10 currently. Pt has been performing HEP and says it has helped.    Pertinent History Pt is a pleasant 75 y.o. male referred to PT for LBP which occured early February. Pt has a PMH of prostate cx, TIA, chronic a-fib, and recent hematuria. Pt has  pain with t/f from STS but feels better with walking. Pt states his LBP has been improving but his knees and hips have neem giving him more pain. Pt's LBP is a strong ache that is on the R side but denies radicular symptoms. Pt reports exercises given by MD have been beneficial and improving his LBP symptoms. Currently in no pain during subjective. when LBP is flared up he reports 4-5/10 NPS. Worst pain is 5-6/10 NPS but pt is unable to explain what causes worst pain but reports possibly most pain due sitting for long periods of time. Prednisone has improved LBP. Pt reports his goals in therapy are to improve balance although he did not mention to Dr. Sabra [name] when referred to PT for his LBP. Also wants to improve his strength. He reports he requires objects to help support him when walking such as walls or furniture. Pt denies falls, but  spouse reports a fall last fall at home where he tripped over a blanket. Has reported near falls where he needed touching of an object to correct. Pt uses a stick now and then for balance but does report difficulty transferring from sturdy to unstardy surfaces (i.e. stepping over a stream) and walking stick helps.    Currently in Pain? Yes    Pain Score 4  Pain Location Back    Pain Orientation Lower    Pain Descriptors / Indicators Aching            Interventions:  Physioball rollouts - forward/backwards, side-to-side 10x for each in each direction; pt reports feels like a stretch of a sore muscle  Lower trunk rotations 10x each direction; reports tightness felt in R low back when going to L side  Knee to chest stretch (assisted)-  2x30 sec each LE  Glute bridge through pain-limited/pain-free range 10x  Hip flexor stretch (assisted) 30 sec each LE  Supine hip flexor stretch (gravity assisted) 30 sec each LE; pt L side with increased "tightness" sensation compared to R.   STS 10x, reports aggravates back pain  Physioball rollouts for pain modulation  following STS - 10x for each forward/backward, side-to-side cuing to hold briefly at end range. Reports improvement in back sx.  FABER stretch 30 sec each LE  Lunge calf stretch - pt difficulty with technique. Modified to standing calf stretch with wedge 60 sec each LE   Treadmill for lower extremity muscle and cardiorespiratory endurance.  The patient ambulates up to 2.1 mph for 7 min 30 sec. Rates as a moderate challenge but reports bilateral lower extremity fatigue.  Requires cueing throughout to improve upright posture and rely less on upper extremity weightbearing on handlebars, cuing for proximity to handles. RPE 5/10  Pt educated throughout session about proper posture and technique with exercises. Improved exercise technique, movement at target joints, use of target muscles after min to mod verbal, visual, tactile cues.  Pt reports no back pain at end of session.     PT Education - 01/03/21 1107     Education Details exercise technique, body mechanics    Person(s) Educated Patient    Methods Explanation;Demonstration;Verbal cues;Tactile cues    Comprehension Verbalized understanding;Returned demonstration;Need further instruction              PT Short Term Goals - 11/22/20 1023       PT SHORT TERM GOAL #1   Title Patient will be independent in home exercise program to improve strength/mobility for better functional independence with ADLs.    Baseline 3/3: established HEP today. See medbridge access code; 06/07/2020 pt reports doing some of his HEP, but that he has difficulty with some of the supine exercises; 4/28: pt not yet fully indep. with HEP; 5/10: pt inconsistently performing HEP; 6/20: doing HEP 3x a week; 9/15: has been walking 30 min 3-5 days a week;    Time 4    Period Weeks    Status Achieved    Target Date 08/02/20               PT Long Term Goals - 11/22/20 1024       PT LONG TERM GOAL #1   Title Patient will increase FOTO score equal to or  greater than 69 to demonstrate statistically significant improvement in mobility and quality of life.    Baseline 3/3: 55; 3/31 59%; 4/28: 60%; 5/9: 56%, 6/20: 63%; 7/21: 53% (impacted by pt recurrent onset of LBP after lifting heavy furniture); 9/1: 58%    Time 8    Period Weeks    Status On-going    Target Date 01/17/21      PT LONG TERM GOAL #2   Title Patient (> 61 years old) will complete five times sit to stand test in < 12 seconds indicating an increased LE strength and improved balance.  Baseline 3/3: 15.38 m/s; 06/07/2020:  11 sec, 6/20: 13.5 sec with arms across chest; 7/21: 12.3 sec; 9/1: 11.7 sec    Time 8    Period Weeks    Status Achieved    Target Date 01/17/21      PT LONG TERM GOAL #3   Title Pt will improve hip extension AROM > 10 deg bilaterally to normalize reciprocal gait and balance.    Baseline 3/3: R/L 4 deg/3 deg; 3/31 R/L 5 deg/3 deg; 4/28: 5 deg R, 0 deg. L; 4/9: 4-5 deg. ext B, 6/20: 8 degrees bilaterally; 7/21: L hip 8 de.g, R hip 5 deg.; 9/1: L 7 deg., R 9 deg.    Time 8    Period Weeks    Status On-going    Target Date 01/17/21      PT LONG TERM GOAL #4   Title Pt will improve Berg score > 46 to display decreased risk of falls.    Baseline 3/3: Will assess upon MD approval; 06/07/2020 52/56; 4/28: 55/56    Time 8    Period Weeks    Status Achieved    Target Date 01/17/21      PT LONG TERM GOAL #5   Title The patient will be able to maintain at least 10 seconds of SLB on BLEs in order to decrease risk of falls when navigating obstacles, curbs and steps.    Baseline 06/07/2020: pt unable to maintain more than 3 sec of SLB without UE support; 4/28: 15 sec BLEs; 9/1: 9 sec R, 5 sec L    Time 8    Period Weeks    Status Achieved    Target Date 01/17/21      PT LONG TERM GOAL #6   Title Patient will increase dynamic gait index score to >20/24 as to demonstrate reduced fall risk and improved dynamic gait balance for better safety with community/home  ambulation.    Baseline 4/28: 17/24; 5/9: 19/24, 6/20: 20/24; 7/21: 22/24    Time 8    Period Weeks    Status Achieved    Target Date 01/17/21                   Plan - 01/03/21 1201     Clinical Impression Statement Pt presented with increased back pain following absence from PT last week. Following interventions pt reported no back pain. Pt was able to progress treadmill training from ambulating 1.8 to 2.1 mph, indicating improved functional capacity and endurance. The pt will benefit from continued skilled therapy to work on cardiorespiratory and muscular endruance, strength, balance and pain.    Personal Factors and Comorbidities Age;Comorbidity 3+;Past/Current Experience    Comorbidities A-fib, lumbar disc disease, prostate cancer, and  history of TIA.    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    Rehab Potential Good    PT Frequency 1x / week    PT Duration 8 weeks    PT Treatment/Interventions ADLs/Self Care Home Management;Biofeedback;Aquatic Therapy;Canalith Repostioning;Cryotherapy;Electrical Stimulation;Iontophoresis 31m/ml Dexamethasone;Moist Heat;Traction;Ultrasound;Gait training;Stair training;DME Instruction;Functional mobility training;Therapeutic activities;Therapeutic exercise;Balance training;Neuromuscular re-education;Patient/family education;Manual techniques;Passive range of motion;Dry needling;Energy conservation;Vestibular;Joint Manipulations    PT Next Visit Plan Prepare for DC: most LT goals met or partially met; update HEP as needed.  Addition of endurance exercise on treadmill    PT Home Exercise Plan access code: [email or web address]; added quadruped knee flexion stretch and half stretch off a step  7/21, no updates on this date    Consulted and Agree with Plan of Care Patient              Patient will benefit from skilled therapeutic intervention in order to improve the following deficits and impairments:  Abnormal gait, Decreased balance, Decreased range of motion, Decreased strength, Hypomobility, Impaired sensation, Pain, Improper body mechanics, Impaired flexibility, Decreased mobility  Visit Diagnosis: Acute low back pain, unspecified back pain laterality, unspecified whether sciatica present  Muscle weakness (generalized)  Other abnormalities of gait and mobility  Unsteadiness on feet     Problem List There are no problems to display for this patient.   Zollie Pee, PT 01/03/2021, 12:06 PM  [place] MAIN [facility] SERVICES [address] [place], [name], [address] Phone: [phone]   Fax:  [phone]  Name: Nicholas [name] MRN: [id number] Date of Birth: 1945-04-22

## 2021-01-07 ENCOUNTER — Ambulatory Visit: Payer: Medicare Other

## 2021-01-10 ENCOUNTER — Other Ambulatory Visit: Payer: Self-pay

## 2021-01-10 ENCOUNTER — Ambulatory Visit: Payer: Medicare Other | Attending: Internal Medicine

## 2021-01-10 DIAGNOSIS — G8929 Other chronic pain: Secondary | ICD-10-CM | POA: Insufficient documentation

## 2021-01-10 DIAGNOSIS — R2689 Other abnormalities of gait and mobility: Secondary | ICD-10-CM | POA: Diagnosis present

## 2021-01-10 DIAGNOSIS — M545 Low back pain, unspecified: Secondary | ICD-10-CM | POA: Insufficient documentation

## 2021-01-10 DIAGNOSIS — M6281 Muscle weakness (generalized): Secondary | ICD-10-CM | POA: Diagnosis present

## 2021-01-10 DIAGNOSIS — R2681 Unsteadiness on feet: Secondary | ICD-10-CM | POA: Insufficient documentation

## 2021-01-10 NOTE — Therapy (Signed)
[place] MAIN [facility] SERVICES [address] [place], [name], [address] Phone: [phone]   Fax:  [phone]  Physical Therapy Treatment  Patient Details  Name: Nicholas [name] MRN: [id number] Date of Birth: 12-Dec-1945 Referring Provider (PT): Rusty Aus, MD   Encounter Date: 01/10/2021   PT End of Session - 01/10/21 1126     Visit Number 79    Number of Visits 37    Date for PT Re-Evaluation 01/17/21    Authorization Type [employer] Medicare, BCBS supplemental    Authorization Time Period 11/22/20-01/17/21    Progress Note Due on Visit 72    PT Start Time 1101    PT Stop Time 1145    PT Time Calculation (min) 44 min    Equipment Utilized During Treatment Gait belt    Activity Tolerance Patient tolerated treatment well;No increased pain    Behavior During Therapy [facility] for tasks assessed/performed             Past Medical History:  Diagnosis Date   A-fib ([place])    Cancer ([place]) 10/25/2019   Prostate   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   Subjective Assessment - 01/10/21 1054     Subjective Pt reports some continued back pain. Pt reports he went grocery shopping yesterday and that walking helped. Pt reports pain is currently mild.    Pertinent History Pt is a pleasant 75 y.o. male referred to PT for LBP which occured early February. Pt has a PMH of prostate cx, TIA, chronic a-fib, and recent hematuria. Pt has pain with t/f from STS but feels better with walking. Pt states his LBP  has been improving but his knees and hips have neem giving him more pain. Pt's LBP is a strong ache that is on the R side but denies radicular symptoms. Pt reports exercises given by MD have been beneficial and improving his LBP symptoms. Currently in no pain during subjective. when LBP is flared up he reports 4-5/10 NPS. Worst pain is 5-6/10 NPS but pt is unable to explain what causes worst pain but reports possibly most pain due sitting for long periods of time. Prednisone has improved LBP. Pt reports his goals in therapy are to improve balance although he did not mention to Dr. Sabra [name] when referred to PT for his LBP. Also wants to improve his strength. He reports he requires objects to help support him when walking such as walls or furniture. Pt denies falls, but  spouse reports a fall last fall at home where he tripped over a blanket. Has reported near falls where he needed touching of an object to correct. Pt uses a stick now and then for balance but does report difficulty transferring from sturdy to unstardy surfaces (i.e. stepping over a stream) and walking stick helps.    Currently in Pain? Yes    Pain Location Back              Interventions:  Treadmill for lower extremity muscle and cardiorespiratory endurance.  The patient ambulates up to 2.2 mph for 9 min. Rates as a easy challenge but reports bilateral lower extremity fatigue and exhibits fatigue. Requires cueing throughout to improve upright posture and rely less on upper extremity weightbearing on handlebars, cuing for proximity to handles.   The following interventions were performed a support surface with CGA unless otherwise indicated  Progression from NBOS, semi-tandem and tandem stance for each LE while tossing ball back and forth at varying heights x several minutes  Standing NBOS on airex pad - 60 sec; cuing for upright posture  Standing semi-tandem on airex pad - 2x60 sec each LE ;cuing for upright posture; second set  performed with cog. dual task of counting backwards from 50, and naming animals Comments: pt uses intermittent UE support  SLB - 30 sec each LE with intermittent UE support   SLB progression - 3 balance pods placed in front of pt, with PT calling out which balance pod/sequence to tap with LE. Pt performs multiple reps with each LE. Pt rates intervention as moderate difficulty.   Standing on airex, EC, NBOS 3x30-45 sec; rates medium, requires intermittent UE support   Standing on airex, WBOS, with horizontal head turns - 30 sec --progressed to NBOS x 30 sec     Pt educated throughout session about proper posture and technique with exercises. Improved exercise technique, movement at target joints, use of target muscles after min to mod verbal, visual, tactile cues.          PT Short Term Goals - 11/22/20 1023       PT SHORT TERM GOAL #1   Title Patient will be independent in home exercise program to improve strength/mobility for better functional independence with ADLs.    Baseline 3/3: established HEP today. See medbridge access code; 06/07/2020 pt reports doing some of his HEP, but that he has difficulty with some of the supine exercises; 4/28: pt not yet fully indep. with HEP; 5/10: pt inconsistently performing HEP; 6/20: doing HEP 3x a week; 9/15: has been walking 30 min 3-5 days a week;    Time 4    Period Weeks    Status Achieved    Target Date 08/02/20               PT Long Term Goals - 11/22/20 1024       PT LONG TERM GOAL #1   Title Patient will increase FOTO score equal to or greater than 69 to demonstrate statistically significant improvement in mobility and quality of life.    Baseline 3/3: 55; 3/31 59%; 4/28: 60%; 5/9: 56%, 6/20: 63%; 7/21: 53% (impacted by pt recurrent onset of LBP after lifting heavy furniture); 9/1: 58%    Time 8    Period Weeks    Status On-going    Target Date 01/17/21      PT LONG TERM GOAL #2   Title Patient (> 34 years old) will  complete five times sit to stand test in < 12 seconds indicating an increased LE strength and improved balance.    Baseline 3/3: 15.38 m/s; 06/07/2020:  11 sec, 6/20: 13.5 sec with arms across chest; 7/21: 12.3 sec; 9/1: 11.7 sec    Time 8    Period Weeks    Status Achieved    Target Date 01/17/21      PT LONG TERM GOAL #3   Title Pt will improve hip extension AROM > 10 deg bilaterally  to normalize reciprocal gait and balance.    Baseline 3/3: R/L 4 deg/3 deg; 3/31 R/L 5 deg/3 deg; 4/28: 5 deg R, 0 deg. L; 4/9: 4-5 deg. ext B, 6/20: 8 degrees bilaterally; 7/21: L hip 8 de.g, R hip 5 deg.; 9/1: L 7 deg., R 9 deg.    Time 8    Period Weeks    Status On-going    Target Date 01/17/21      PT LONG TERM GOAL #4   Title Pt will improve Berg score > 46 to display decreased risk of falls.    Baseline 3/3: Will assess upon MD approval; 06/07/2020 52/56; 4/28: 55/56    Time 8    Period Weeks    Status Achieved    Target Date 01/17/21      PT LONG TERM GOAL #5   Title The patient will be able to maintain at least 10 seconds of SLB on BLEs in order to decrease risk of falls when navigating obstacles, curbs and steps.    Baseline 06/07/2020: pt unable to maintain more than 3 sec of SLB without UE support; 4/28: 15 sec BLEs; 9/1: 9 sec R, 5 sec L    Time 8    Period Weeks    Status Achieved    Target Date 01/17/21      PT LONG TERM GOAL #6   Title Patient will increase dynamic gait index score to >20/24 as to demonstrate reduced fall risk and improved dynamic gait balance for better safety with community/home ambulation.    Baseline 4/28: 17/24; 5/9: 19/24, 6/20: 20/24; 7/21: 22/24    Time 8    Period Weeks    Status Achieved    Target Date 01/17/21                   Plan - 01/10/21 1132     Clinical Impression Statement Pt not limited by back pain today. Session focused primarily on balance interventions. Pt was able to perform higher level balance intervention (tossing ball back  and forth) to incorporate reactive postural control and coordinatoin task. Overall, the pt required intermittent UE support and CGA with majority of interventions, with continued increased difficulty on LLE compared to RLE.  The pt will benefit from further skilled PT to continue to improve pain, strength, and balance in order to decrease fall risk and increase QOL.    Personal Factors and Comorbidities Age;Comorbidity 3+;Past/Current Experience    Comorbidities A-fib, lumbar disc disease, prostate cancer, and  history of TIA.    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    Rehab Potential Good    PT Frequency 1x / week    PT Duration 8 weeks    PT Treatment/Interventions ADLs/Self Care Home Management;Biofeedback;Aquatic Therapy;Canalith Repostioning;Cryotherapy;Electrical Stimulation;Iontophoresis 99m/ml Dexamethasone;Moist Heat;Traction;Ultrasound;Gait training;Stair training;DME Instruction;Functional mobility training;Therapeutic activities;Therapeutic exercise;Balance training;Neuromuscular re-education;Patient/family education;Manual techniques;Passive range of motion;Dry needling;Energy conservation;Vestibular;Joint Manipulations    PT Next Visit Plan Prepare for DC: most LT goals met or partially met; update HEP as needed.  Addition of endurance exercise on treadmill    PT Home Exercise Plan access code: [email or web address] added quadruped knee flexion stretch and half stretch off a step 7/21, no updates on this date    Consulted and Agree with Plan of Care Patient             Patient will benefit from skilled therapeutic intervention in order to  improve the following deficits and impairments:  Abnormal gait, Decreased balance, Decreased range of motion, Decreased strength, Hypomobility, Impaired sensation, Pain,  Improper body mechanics, Impaired flexibility, Decreased mobility  Visit Diagnosis: Unsteadiness on feet  Chronic bilateral low back pain, unspecified whether sciatica present  Other abnormalities of gait and mobility     Problem List There are no problems to display for this patient.   Zollie Pee, PT 01/10/2021, 4:51 PM  [place] MAIN [facility] SERVICES [address] [place], [name], [address] Phone: [phone]   Fax:  [phone]  Name: Nicholas [name] MRN: [id number] Date of Birth: 1945-03-26

## 2021-01-14 ENCOUNTER — Ambulatory Visit: Payer: Medicare Other

## 2021-01-17 ENCOUNTER — Other Ambulatory Visit: Payer: Self-pay

## 2021-01-17 ENCOUNTER — Ambulatory Visit: Payer: Medicare Other

## 2021-01-17 DIAGNOSIS — R2689 Other abnormalities of gait and mobility: Secondary | ICD-10-CM

## 2021-01-17 DIAGNOSIS — R2681 Unsteadiness on feet: Secondary | ICD-10-CM | POA: Diagnosis not present

## 2021-01-17 DIAGNOSIS — M6281 Muscle weakness (generalized): Secondary | ICD-10-CM

## 2021-01-17 DIAGNOSIS — M545 Low back pain, unspecified: Secondary | ICD-10-CM

## 2021-01-17 NOTE — Therapy (Signed)
[place] MAIN [facility] SERVICES [address] [place], [name], [address] Phone: [phone]   Fax:  [phone]  Physical Therapy Treatment  Patient Details  Name: Nicholas [name] MRN: [id number] Date of Birth: [birth year] Referring Provider (PT): Rusty Aus, MD   Encounter Date: 01/17/2021   PT End of Session - 01/17/21 1111     Visit Number 56    Number of Visits 45    Date for PT Re-Evaluation 01/17/21    Authorization Type [employer] Medicare, BCBS supplemental    Authorization Time Period 11/22/20-01/17/21    Progress Note Due on Visit 49    PT Start Time 1106    PT Stop Time 1149    PT Time Calculation (min) 43 min    Equipment Utilized During Treatment Gait belt    Activity Tolerance Patient tolerated treatment well;No increased pain    Behavior During Therapy [facility] for tasks assessed/performed             Past Medical History:  Diagnosis Date   A-fib ([place])    Cancer ([place]) 10/25/2019   Prostate   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   Subjective Assessment - 01/17/21 1107     Subjective Pt reports he is feeling a bit more stiff today. He reports some of his execises have helped the sensation in his toes at night. Pt did a lot of yardwork last week. He reports slight soreness in his back currently.    Pertinent History Pt is a pleasant 75 y.o. male referred to PT for LBP which occured early February. Pt has a PMH of prostate cx, TIA, chronic a-fib, and recent hematuria. Pt has  pain with t/f from STS but feels better with walking. Pt states his LBP has been improving but his knees and hips have neem giving him more pain. Pt's LBP is a strong ache that is on the R side but denies radicular symptoms. Pt reports exercises given by MD have been beneficial and improving his LBP symptoms. Currently in no pain during subjective. when LBP is flared up he reports 4-5/10 NPS. Worst pain is 5-6/10 NPS but pt is unable to explain what causes worst pain but reports possibly most pain due sitting for long periods of time. Prednisone has improved LBP. Pt reports his goals in therapy are to improve balance although he did not mention to Dr. Sabra [name] when referred to PT for his LBP. Also wants to improve his strength. He reports he requires objects to help support him when walking such as walls or furniture. Pt denies falls, but  spouse reports a fall last fall at home where he tripped over a blanket. Has reported near falls where he needed touching of an object to correct. Pt uses a stick now and then for balance but does report difficulty transferring from sturdy to unstardy surfaces (i.e. stepping over a stream) and walking stick helps.    Limitations Standing;Walking;House hold activities  How long can you sit comfortably? Unlimited    How long can you stand comfortably? Unlimited    How long can you walk comfortably? ~1/2 mile    Patient Stated Goals Improve his balance, LE strength, hip/knee pain and stiffness    Currently in Pain? Yes    Pain Location Back    Pain Onset In the past 7 days              Interventions: CGA provided throughout unless otherwise indicated   Treadmill for lower extremity muscle and cardiorespiratory endurance.  The patient ambulates up to 2.3 mph, HR reached 107 bpm, total duration of intervention - 8 min. Continued cues for upright posture, increased heel strike to clear LEs. Pt rates as medium. Pt reports he feels his back is loosening up.   Step-up  and down onto 6" step, where pt steps up with LLE and down with RLE- 10x,  Comments: pt does reports "slight" L knee pain initially that he reports improves with reps. Pt rates as medium. Exhibits slight unsteadiness.  STS- 10; pt rates as easy STS with RLE placed on green airex pad to promote LLE strengthening - 10x Pt exhibits unsteadiness with intervention, requires close CGA, pt uses step strategy   STS with LLE placed on green airex pad to promote RLE strengthening - 10x. Pt exhibits fatigue and unsteadiness with intervention. Close CGA provided.  Seated physioball forward/backward rollouts 10x each way  Seated physioball lateral rollouts (B) - 10x  Standing on airex NBOS 60 sec Standing on airex semi-tandem 2x30 sec each LE: pt only requires UE support 3x, demo's improved postural stability. Slight increase in difficulty with LLE as primary stance leg.  Ball toss back and forth while pt stands on airex, NBOS - x several minutes, intermittent UE support  Side-stepping with ball toss x several reps  SLB 4x30 sec each LE   Pt educated throughout session about proper posture and technique with exercises. Improved exercise technique, movement at target joints, use of target muscles after min to mod verbal, visual, tactile cues.     PT Education - 01/17/21 1110     Education Details exercise technique, body mechanics    Person(s) Educated Patient    Methods Explanation;Demonstration;Verbal cues    Comprehension Verbalized understanding;Returned demonstration;Need further instruction              PT Short Term Goals - 11/22/20 1023       PT SHORT TERM GOAL #1   Title Patient will be independent in home exercise program to improve strength/mobility for better functional independence with ADLs.    Baseline 3/3: established HEP today. See medbridge access code; 06/07/2020 pt reports doing some of his HEP, but that he has difficulty with some of the supine exercises; 4/28: pt  not yet fully indep. with HEP; 5/10: pt inconsistently performing HEP; 6/20: doing HEP 3x a week; 9/15: has been walking 30 min 3-5 days a week;    Time 4    Period Weeks    Status Achieved    Target Date 08/02/20               PT Long Term Goals - 11/22/20 1024       PT LONG TERM GOAL #1   Title Patient will increase FOTO score equal to or greater than 69 to demonstrate statistically significant improvement in mobility and quality of life.    Baseline 3/3: 55; 3/31 59%; 4/28: 60%; 5/9: 56%, 6/20:  63%; 7/21: 53% (impacted by pt recurrent onset of LBP after lifting heavy furniture); 9/1: 58%    Time 8    Period Weeks    Status On-going    Target Date 01/17/21      PT LONG TERM GOAL #2   Title Patient (> 68 years old) will complete five times sit to stand test in < 12 seconds indicating an increased LE strength and improved balance.    Baseline 3/3: 15.38 m/s; 06/07/2020:  11 sec, 6/20: 13.5 sec with arms across chest; 7/21: 12.3 sec; 9/1: 11.7 sec    Time 8    Period Weeks    Status Achieved    Target Date 01/17/21      PT LONG TERM GOAL #3   Title Pt will improve hip extension AROM > 10 deg bilaterally to normalize reciprocal gait and balance.    Baseline 3/3: R/L 4 deg/3 deg; 3/31 R/L 5 deg/3 deg; 4/28: 5 deg R, 0 deg. L; 4/9: 4-5 deg. ext B, 6/20: 8 degrees bilaterally; 7/21: L hip 8 de.g, R hip 5 deg.; 9/1: L 7 deg., R 9 deg.    Time 8    Period Weeks    Status On-going    Target Date 01/17/21      PT LONG TERM GOAL #4   Title Pt will improve Berg score > 46 to display decreased risk of falls.    Baseline 3/3: Will assess upon MD approval; 06/07/2020 52/56; 4/28: 55/56    Time 8    Period Weeks    Status Achieved    Target Date 01/17/21      PT LONG TERM GOAL #5   Title The patient will be able to maintain at least 10 seconds of SLB on BLEs in order to decrease risk of falls when navigating obstacles, curbs and steps.    Baseline 06/07/2020: pt unable to maintain  more than 3 sec of SLB without UE support; 4/28: 15 sec BLEs; 9/1: 9 sec R, 5 sec L    Time 8    Period Weeks    Status Achieved    Target Date 01/17/21      PT LONG TERM GOAL #6   Title Patient will increase dynamic gait index score to >20/24 as to demonstrate reduced fall risk and improved dynamic gait balance for better safety with community/home ambulation.    Baseline 4/28: 17/24; 5/9: 19/24, 6/20: 20/24; 7/21: 22/24    Time 8    Period Weeks    Status Achieved    Target Date 01/17/21                   Plan - 01/17/21 1145     Clinical Impression Statement Pt able to complete higher level balance interventions, requiring CGA. Pt also tolerates interventions well reporting improvements in knee and back pain by end of session. The pt is still most challenged with SLB B. The pt will benefit from further skilled PT to improve pain, mobility, strength and balance to decrease fall risk and increase QOL.    Personal Factors and Comorbidities Age;Comorbidity 3+;Past/Current Experience    Comorbidities A-fib, lumbar disc disease, prostate cancer, and  history of TIA.    Examination-Activity Limitations Reach Overhead;Stand;Bend;Lift;Locomotion Level;Squat    Examination-Participation Restrictions Community Activity;Yard Work    Stability/Clinical Decision Making Evolving/Moderate complexity    Rehab Potential Good    PT Frequency 1x / week    PT Duration 8 weeks  PT Treatment/Interventions ADLs/Self Care Home Management;Biofeedback;Aquatic Therapy;Canalith Repostioning;Cryotherapy;Electrical Stimulation;Iontophoresis 49m/ml Dexamethasone;Moist Heat;Traction;Ultrasound;Gait training;Stair training;DME Instruction;Functional mobility training;Therapeutic activities;Therapeutic exercise;Balance training;Neuromuscular re-education;Patient/family education;Manual techniques;Passive range of motion;Dry needling;Energy conservation;Vestibular;Joint Manipulations    PT Next Visit Plan  Prepare for DC: most LT goals met or partially met; update HEP as needed.  Addition of endurance exercise on treadmill    PT Home Exercise Plan access code: [email or web address] added quadruped knee flexion stretch and half stretch off a step 7/21, no updates on this date    Consulted and Agree with Plan of Care Patient             Patient will benefit from skilled therapeutic intervention in order to improve the following deficits and impairments:  Abnormal gait, Decreased balance, Decreased range of motion, Decreased strength, Hypomobility, Impaired sensation, Pain, Improper body mechanics, Impaired flexibility, Decreased mobility  Visit Diagnosis: Unsteadiness on feet  Chronic bilateral low back pain, unspecified whether sciatica present  Muscle weakness (generalized)  Other abnormalities of gait and mobility     Problem List There are no problems to display for this patient.   HZollie Pee PT 01/17/2021, 12:13 PM  [place] [facility] [address] [address]Phone: [phone]  Fax:  [phone] Name: CBART ASHFORDMRN: [id number] of Birth: 130-Aug-1947

## 2021-01-21 ENCOUNTER — Ambulatory Visit: Payer: Medicare Other

## 2021-01-24 ENCOUNTER — Other Ambulatory Visit: Payer: Self-pay

## 2021-01-24 ENCOUNTER — Ambulatory Visit: Payer: Medicare Other

## 2021-01-24 DIAGNOSIS — R2681 Unsteadiness on feet: Secondary | ICD-10-CM

## 2021-01-24 DIAGNOSIS — R2689 Other abnormalities of gait and mobility: Secondary | ICD-10-CM

## 2021-01-24 DIAGNOSIS — M6281 Muscle weakness (generalized): Secondary | ICD-10-CM

## 2021-01-24 NOTE — Therapy (Addendum)
[place] MAIN [facility] SERVICES [address] [place], [name], [address] Phone: [phone]   Fax:  [phone]  Physical Therapy Treatment/RECERTIFICATION  Patient Details  Name: Nicholas [name] MRN: [id number] Date of Birth: [birth year] Referring Provider (PT): Rusty Aus, MD   Encounter Date: 01/24/2021   PT End of Session - 01/24/21 1246     Visit Number 59    Number of Visits 75    Date for PT Re-Evaluation 03/21/21    Authorization Type [employer] Medicare, BCBS supplemental    Authorization Time Period 11/22/20-01/17/21    Progress Note Due on Visit 60    PT Start Time 1104    PT Stop Time 1147    PT Time Calculation (min) 43 min    Equipment Utilized During Treatment Gait belt    Activity Tolerance Patient tolerated treatment well;No increased pain    Behavior During Therapy [facility] for tasks assessed/performed             Past Medical History:  Diagnosis Date   A-fib ([place])    Cancer ([place]) 10/25/2019   Prostate   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   Subjective Assessment - 01/24/21 1102     Subjective Pt reports he has some soreness in his legs and back. Pt reports he's been completing his HEP 2-3 times a week (calf stretch ankle circles, piriformis stretch, back stretches. He denies any pain.    Pertinent History Pt is a pleasant 75 y.o. male referred to PT for LBP which occured early February. Pt has a PMH of prostate cx, TIA, chronic a-fib, and recent hematuria. Pt has pain  with t/f from STS but feels better with walking. Pt states his LBP has been improving but his knees and hips have neem giving him more pain. Pt's LBP is a strong ache that is on the R side but denies radicular symptoms. Pt reports exercises given by MD have been beneficial and improving his LBP symptoms. Currently in no pain during subjective. when LBP is flared up he reports 4-5/10 NPS. Worst pain is 5-6/10 NPS but pt is unable to explain what causes worst pain but reports possibly most pain due sitting for long periods of time. Prednisone has improved LBP. Pt reports his goals in therapy are to improve balance although he did not mention to Dr. Sabra [name] when referred to PT for his LBP. Also wants to improve his strength. He reports he requires objects to help support him when walking such as walls or furniture. Pt denies falls, but  spouse reports a fall last fall at home where he tripped over a blanket. Has reported near falls where he needed touching of an object to correct. Pt uses a stick now and then for balance but does report difficulty transferring from sturdy to unstardy surfaces (i.e. stepping over a stream) and walking stick helps.    Limitations Standing;Walking;House hold activities    How long can you sit comfortably? Unlimited  How long can you stand comfortably? Unlimited    How long can you walk comfortably? ~1/2 mile    Patient Stated Goals Improve his balance, LE strength, hip/knee pain and stiffness    Pain Onset In the past 7 days             Intervention:  Goal reassessment for Recertification:  FOTO: 56%  SLB: 8 sec on the left, 12 sec on the right  Hip extension: 7 degrees right, 9 degrees left   Treadmill for lower extremity muscle and cardiorespiratory endurance.  The patient ambulates up to 2.5 mph, total duration of intervention - 9 min. Continued cues for upright posture, increased heel strike to clear LEs. Pt reports he feels his back is loosening up.    Single-leg leg press (left) 2 x 10 reps with 25 lbs with emphasis on LLE eccentric control. Pt perceived as easy.  [place] Balance trainer: [employer] 2 rounds. Pt utilized BUE with CGA from SPT. Focus on ankle strategy and limits of stability to improve balance.   Patient's condition has the potential to improve in response to therapy. Maximum improvement is yet to be obtained. The anticipated improvement is attainable and reasonable in a generally predictable time.   PT Education - 01/24/21 1733     Education Details assessment findings, indications for POC    Person(s) Educated Patient    Methods Explanation    Comprehension Verbalized understanding              PT Short Term Goals - 01/24/21 1248       PT SHORT TERM GOAL #1   Title Patient will be independent in home exercise program to improve strength/mobility for better functional independence with ADLs.    Baseline 3/3: established HEP today. See medbridge access code; 06/07/2020 pt reports doing some of his HEP, but that he has difficulty with some of the supine exercises; 4/28: pt not yet fully indep. with HEP; 5/10: pt inconsistently performing HEP; 6/20: doing HEP 3x a week; 9/15: has been walking 30 min 3-5 days a week; 11/17: reports completing his HEP 2-3 times a week, but also inconsistently    Time 4    Period Weeks    Status Achieved    Target Date 08/02/20               PT Long Term Goals - 01/24/21 1300       PT LONG TERM GOAL #1   Title Patient will increase FOTO score equal to or greater than 69 to demonstrate statistically significant improvement in mobility and quality of life.    Baseline 3/3: 55; 3/31 59%; 4/28: 60%; 5/9: 56%, 6/20: 63%; 7/21: 53% (impacted by pt recurrent onset of LBP after lifting heavy furniture); 9/1: 58%; 11/17: 56%    Time 8    Period Weeks    Status On-going    Target Date 03/21/21      PT LONG TERM GOAL #2   Title Patient (> 44 years old) will complete five times sit  to stand test in < 12 seconds indicating an increased LE strength and improved balance.    Baseline 3/3: 15.38 m/s; 06/07/2020:  11 sec, 6/20: 13.5 sec with arms across chest; 7/21: 12.3 sec; 9/1: 11.7 sec    Time 8    Period Weeks    Status Achieved    Target Date 03/21/21      PT LONG TERM GOAL #3   Title Pt will improve  hip extension AROM > 10 deg bilaterally to normalize reciprocal gait and balance.    Baseline 3/3: R/L 4 deg/3 deg; 3/31 R/L 5 deg/3 deg; 4/28: 5 deg R, 0 deg. L; 4/9: 4-5 deg. ext B, 6/20: 8 degrees bilaterally; 7/21: L hip 8 de.g, R hip 5 deg.; 9/1: L 7 deg., R 9 deg; 11/17: L 9 deg., R 7 deg.    Time 8    Period Weeks    Status On-going    Target Date 03/21/21      PT LONG TERM GOAL #4   Title Pt will improve Berg score > 46 to display decreased risk of falls.    Baseline 3/3: Will assess upon MD approval; 06/07/2020 52/56; 4/28: 55/56    Time 8    Period Weeks    Status Achieved      PT LONG TERM GOAL #5   Title The patient will be able to maintain at least 10 seconds of SLB on BLEs in order to decrease risk of falls when navigating obstacles, curbs and steps.    Baseline 06/07/2020: pt unable to maintain more than 3 sec of SLB without UE support; 4/28: 15 sec BLEs; 9/1: 9 sec R, 5 sec L    Time 8    Period Weeks    Status Achieved      PT LONG TERM GOAL #6   Title Patient will increase dynamic gait index score to >20/24 as to demonstrate reduced fall risk and improved dynamic gait balance for better safety with community/home ambulation.    Baseline 4/28: 17/24; 5/9: 19/24, 6/20: 20/24; 7/21: 22/24    Time 8    Period Weeks    Status Achieved                   Plan - 01/24/21 1456     Clinical Impression Statement Pt goals reassessed for recertification. Pt shows improvements in left hip extension AROM to 9 degrees showing progress toward overall goal of 10 degrees for improved functional mobility. While patient continues to show progress, SLB and  FOTO scores were similar to prior assessment. Pt reported inconsistency in completing his HEP likely effecting these outcomes. PT recertifying for trial period of 8 weeks to assess whether gains are beginning to plateau. Patient's condition has the potential to improve in response to therapy. Maximum improvement is yet to be obtained. The anticipated improvement is attainable and reasonable in a generally predictable time.    Personal Factors and Comorbidities Age;Comorbidity 3+;Past/Current Experience    Comorbidities A-fib, lumbar disc disease, prostate cancer, and  history of TIA.    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    Clinical Decision Making Moderate    Rehab Potential Good    PT Frequency 1x / week    PT Duration 8 weeks    PT Treatment/Interventions ADLs/Self Care Home Management;Biofeedback;Aquatic Therapy;Canalith Repostioning;Cryotherapy;Electrical Stimulation;Iontophoresis 71m/ml Dexamethasone;Moist Heat;Traction;Ultrasound;Gait training;Stair training;DME Instruction;Functional mobility training;Therapeutic activities;Therapeutic exercise;Balance training;Neuromuscular re-education;Patient/family education;Manual techniques;Passive range of motion;Dry needling;Energy conservation;Vestibular;Joint Manipulations    PT Next Visit Plan Prepare for DC: most LT goals met or partially met; update HEP as needed.  Addition of endurance exercise on treadmill    PT Home Exercise Plan access code: [email or web address] added quadruped knee flexion stretch and half stretch off a step 7/21, no updates on this date    Consulted and Agree with Plan of Care Patient  Patient will benefit from skilled therapeutic intervention in order to improve the following deficits and impairments:  Abnormal gait, Decreased  balance, Decreased range of motion, Decreased strength, Hypomobility, Impaired sensation, Pain, Improper body mechanics, Impaired flexibility, Decreased mobility  Visit Diagnosis: Unsteadiness on feet  Other abnormalities of gait and mobility  Muscle weakness (generalized)     Problem List There are no problems to display for this patient. Ricard Dillon PT, DPT This entire session was performed under direct supervision and direction of a licensed therapist/therapist assistant . I have personally read, edited and approve of the note as written.   Karn Cassis, SPT 01/24/2021, 5:36 PM  [place] MAIN [facility] SERVICES [address] [place], [name], [address] Phone: [phone]   Fax:  [phone]  Name: Nicholas [name] MRN: [id number] Date of Birth: 18-Jun-1945

## 2021-01-28 ENCOUNTER — Ambulatory Visit: Payer: Medicare Other

## 2021-01-28 ENCOUNTER — Other Ambulatory Visit: Payer: Self-pay

## 2021-01-28 DIAGNOSIS — R2681 Unsteadiness on feet: Secondary | ICD-10-CM

## 2021-01-28 DIAGNOSIS — R2689 Other abnormalities of gait and mobility: Secondary | ICD-10-CM

## 2021-01-28 DIAGNOSIS — M6281 Muscle weakness (generalized): Secondary | ICD-10-CM

## 2021-01-28 NOTE — Therapy (Cosign Needed Addendum)
[name] [address] [place], [name], [address] Phone: [phone]   Fax:  [phone]  Physical Therapy Treatment/Physical Therapy Progress Note Visit 60  Dates of reporting period  11/08/2020   to   01/28/2021  Patient Details  Name: Nicholas [name] MRN: [id number] Date of Birth: [birth year] Referring Provider (PT): Rusty Aus, MD   Encounter Date: 01/28/2021   PT End of Session - 01/28/21 1254     Visit Number 60    Number of Visits 2    Date for PT Re-Evaluation 03/21/21    Authorization Type [employer] Medicare, BCBS supplemental    Authorization Time Period 01/17/21-03/21/2021    Progress Note Due on Visit 64    PT Start Time 1144    PT Stop Time 1230    PT Time Calculation (min) 46 min    Equipment Utilized During Treatment Gait belt    Activity Tolerance Patient tolerated treatment well;No increased pain    Behavior During Therapy [facility] for tasks assessed/performed             Past Medical History:  Diagnosis Date   A-fib ([place])    Cancer ([place]) 10/25/2019   Prostate   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   Subjective Assessment - 01/28/21 1142     Subjective Pt reports some low back soreness following his last session. He reports stiffness in his back and had a couple dizzy spells that came "out of nowhere" over the weekend. He denies any pains or falls but reports some unsteadiness during one of the dizzy spells.    Pertinent History Pt is a pleasant  75 y.o. male referred to PT for LBP which occured early February. Pt has a PMH of prostate cx, TIA, chronic a-fib, and recent hematuria. Pt has pain with t/f from STS but feels better with walking. Pt states his LBP has been improving but his knees and hips have neem giving him more pain. Pt's LBP is a strong ache that is on the R side but denies radicular symptoms. Pt reports exercises given by MD have been beneficial and improving his LBP symptoms. Currently in no pain during subjective. when LBP is flared up he reports 4-5/10 NPS. Worst pain is 5-6/10 NPS but pt is unable to explain what causes worst pain but reports possibly most pain due sitting for long periods of time. Prednisone has improved LBP. Pt reports his goals in therapy are to improve balance although he did not mention to Dr. Sabra [name] when referred to PT for his LBP. Also wants to improve his strength. He reports he requires objects to help support him when walking such as walls or furniture. Pt denies falls, but  spouse reports a fall last fall at home where he tripped over a blanket. Has reported near falls where he needed touching of an object to correct. Pt uses a stick now and then for balance but does report difficulty transferring from  sturdy to unstardy surfaces (i.e. stepping over a stream) and walking stick helps.    Limitations Standing;Walking;House hold activities    How long can you sit comfortably? Unlimited    How long can you stand comfortably? Unlimited    How long can you walk comfortably? ~1/2 mile    Patient Stated Goals Improve his balance, LE strength, hip/knee pain and stiffness    Currently in Pain? No/denies    Pain Onset In the past 7 days              *Goals reassessed last session for Recert. See goals reassessments in treatment note from 01/24/2021*   Interventions: CGA provided throughout unless otherwise indicated   Therex:  Treadmill for lower extremity muscle and cardiorespiratory endurance.  The  patient ambulates up to 2.6 mph, total duration of intervention - 10 min. Continued cues for upright posture, increased heel strike to clear LEs. Pt rates as medium. Pt reports he feels his back is loosening up.   STS with LLE placed on green airex pad to promote RLE strengthening - 10x. Pt exhibits fatigue and unsteadiness with intervention. Close CGA provided.  STS with RLE placed on green airex pad to promote LLE strengthening - 10x. Pt exhibits fatigue and unsteadiness with intervention. Close CGA provided. - Pt presented with muscular fatigue on the right quad leading to feelings of the knee "wanting to give out"  Unweighted LAQ x 10 reps to loosen up the right leg.  Terminal knee extension with RTB 2 x 12 reps on RLE.   Seated physioball forward/backward rollouts x multiple reps each way  Seated physioball lateral rollouts (B) x multiple reps each way   Neuromuscular Re-ed:  Standing on airex NBOS 2 x 60 sec sec with horizontal head turns. Pt with mild lateral sway needing UE support to regain balance x 3 times.  Standing on airex NBOS 3 x 60 sec with vertical head turns. Pt with backward LOB x 3 times with tactile cues to improve correction and ankle strategy. Pt improves with reps.  Standing on airex NBOS, eyes closed 3 x 60 sec. Pt with moderate lateral sway needing intermittent UE support to regain balance. Pt improves with trials         PT Short Term Goals - 01/28/21 1426       PT SHORT TERM GOAL #1   Title Patient will be independent in home exercise program to improve strength/mobility for better functional independence with ADLs.    Baseline 3/3: established HEP today. See medbridge access code; 06/07/2020 pt reports doing some of his HEP, but that he has difficulty with some of the supine exercises; 4/28: pt not yet fully indep. with HEP; 5/10: pt inconsistently performing HEP; 6/20: doing HEP 3x a week; 9/15: has been walking 30 min 3-5 days a week; 11/17: reports  completing his HEP 2-3 times a week, but also inconsistently    Time 4    Period Weeks    Status Achieved    Target Date 08/02/20               PT Long Term Goals - 01/28/21 1427       PT LONG TERM GOAL #1   Title Patient will increase FOTO score equal to or greater than 69 to demonstrate statistically significant improvement in mobility and quality of life.    Baseline 3/3: 55; 3/31 59%; 4/28: 60%; 5/9: 56%, 6/20: 63%; 7/21: 53% (impacted by pt recurrent onset of LBP  after lifting heavy furniture); 9/1: 58%; 11/17: 56%    Time 8    Period Weeks    Status On-going      PT LONG TERM GOAL #2   Title Patient (> 96 years old) will complete five times sit to stand test in < 12 seconds indicating an increased LE strength and improved balance.    Baseline 3/3: 15.38 m/s; 06/07/2020:  11 sec, 6/20: 13.5 sec with arms across chest; 7/21: 12.3 sec; 9/1: 11.7 sec    Time 8    Period Weeks    Status Achieved      PT LONG TERM GOAL #3   Title Pt will improve hip extension AROM > 10 deg bilaterally to normalize reciprocal gait and balance.    Baseline 3/3: R/L 4 deg/3 deg; 3/31 R/L 5 deg/3 deg; 4/28: 5 deg R, 0 deg. L; 4/9: 4-5 deg. ext B, 6/20: 8 degrees bilaterally; 7/21: L hip 8 de.g, R hip 5 deg.; 9/1: L 7 deg., R 9 deg; 11/17: L 9 deg., R 7 deg.    Time 8    Period Weeks    Status On-going      PT LONG TERM GOAL #4   Title Pt will improve Berg score > 46 to display decreased risk of falls.    Baseline 3/3: Will assess upon MD approval; 06/07/2020 52/56; 4/28: 55/56    Time 8    Period Weeks    Status Achieved      PT LONG TERM GOAL #5   Title The patient will be able to maintain at least 10 seconds of SLB on BLEs in order to decrease risk of falls when navigating obstacles, curbs and steps.    Baseline 06/07/2020: pt unable to maintain more than 3 sec of SLB without UE support; 4/28: 15 sec BLEs; 9/1: 9 sec R, 5 sec L    Time 8    Period Weeks    Status Achieved      PT LONG  TERM GOAL #6   Title Patient will increase dynamic gait index score to >20/24 as to demonstrate reduced fall risk and improved dynamic gait balance for better safety with community/home ambulation.    Baseline 4/28: 17/24; 5/9: 19/24, 6/20: 20/24; 7/21: 22/24    Time 8    Period Weeks    Status Achieved                   Plan - 01/28/21 1420     Clinical Impression Statement Pt tolerated all strengthening and balance exercises well. Pt reported significant improvement in back stiffness following today's interventions. Pt able to progress endurance activities on the treadmill in both duration and speed. Pt displayed significant fatigue following strengthening exercises, and continues to be challenged with dual-task balance exercises. The pt will benefit from skilled PT to improve pain, strength, and balance to decrease fall risk and improve quality of life.    Personal Factors and Comorbidities Age;Comorbidity 3+;Past/Current Experience    Comorbidities A-fib, lumbar disc disease, prostate cancer, and  history of TIA.    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    Rehab Potential Good    PT Frequency 1x / week    PT Duration 8 weeks    PT Treatment/Interventions ADLs/Self Care Home Management;Biofeedback;Aquatic Therapy;Canalith Repostioning;Cryotherapy;Electrical Stimulation;Iontophoresis 74m/ml Dexamethasone;Moist Heat;Traction;Ultrasound;Gait training;Stair training;DME Instruction;Functional mobility training;Therapeutic activities;Therapeutic exercise;Balance training;Neuromuscular re-education;Patient/family education;Manual techniques;Passive range  of motion;Dry needling;Energy conservation;Vestibular;Joint Manipulations    PT Next Visit Plan Prepare for DC: most LT goals met or partially met; update  HEP as needed.  Addition of endurance exercise on treadmill    PT Home Exercise Plan access code: [email or web address]; added quadruped knee flexion stretch and half stretch off a step 7/21, no updates on this date    Consulted and Agree with Plan of Care Patient             Patient will benefit from skilled therapeutic intervention in order to improve the following deficits and impairments:  Abnormal gait, Decreased balance, Decreased range of motion, Decreased strength, Hypomobility, Impaired sensation, Pain, Improper body mechanics, Impaired flexibility, Decreased mobility  Visit Diagnosis: Unsteadiness on feet  Muscle weakness (generalized)  Other abnormalities of gait and mobility     Problem List There are no problems to display for this patient. Ricard Dillon PT, DPT This entire session was performed under direct supervision and direction of a licensed therapist/therapist assistant . I have personally read, edited and approve of the note as written.  Karn Cassis, SPT 01/28/2021, 2:50 PM  [place] [facility] [address] [place], [name], [address] Phone: [phone]   Fax:  [phone]  Name: TRINDON DORTON MRN: [id number] Date of Birth: [birth year]

## 2021-02-04 ENCOUNTER — Ambulatory Visit: Payer: Medicare Other

## 2021-02-07 ENCOUNTER — Ambulatory Visit: Payer: Medicare Other | Attending: Internal Medicine

## 2021-02-07 ENCOUNTER — Other Ambulatory Visit: Payer: Self-pay

## 2021-02-07 DIAGNOSIS — R2689 Other abnormalities of gait and mobility: Secondary | ICD-10-CM | POA: Diagnosis present

## 2021-02-07 DIAGNOSIS — G8929 Other chronic pain: Secondary | ICD-10-CM | POA: Diagnosis present

## 2021-02-07 DIAGNOSIS — M6281 Muscle weakness (generalized): Secondary | ICD-10-CM | POA: Diagnosis present

## 2021-02-07 DIAGNOSIS — R2681 Unsteadiness on feet: Secondary | ICD-10-CM | POA: Diagnosis present

## 2021-02-07 DIAGNOSIS — M545 Low back pain, unspecified: Secondary | ICD-10-CM | POA: Diagnosis present

## 2021-02-07 NOTE — Therapy (Addendum)
[place] [facility] [address] [place], [name], [address] Phone: [phone]   Fax:  [phone]  Physical Therapy Treatment  Patient Details  Name: [name] MRN: [id number] Date of Birth: 1945-09-30 Referring Provider (PT): Rusty Aus, MD   Encounter Date: 02/07/2021   PT End of Session - 02/07/21 1156     Visit Number 61    Number of Visits 75    Date for PT Re-Evaluation 03/21/21    Authorization Type [employer] Medicare, BCBS supplemental    Authorization Time Period 01/17/21-03/21/2021    Progress Note Due on Visit 4    PT Start Time 1101    PT Stop Time 1145    PT Time Calculation (min) 44 min    Equipment Utilized During Treatment Gait belt    Activity Tolerance Patient tolerated treatment well;No increased pain    Behavior During Therapy [facility] for tasks assessed/performed;Impulsive             Past Medical History:  Diagnosis Date   A-fib [facility])    Cancer (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 Pt reports he's looking into a gym membership to continue to work on treadmill walking.    Pertinent History Pt is a pleasant 75 y.o. male referred to PT for LBP which occured early February. Pt has a PMH of prostate cx, TIA, chronic a-fib, and recent hematuria. Pt has pain with t/f from STS but feels better with walking. Pt states his LBP has been  improving but his knees and hips have neem giving him more pain. Pt's LBP is a strong ache that is on the R side but denies radicular symptoms. Pt reports exercises given by MD have been beneficial and improving his LBP symptoms. Currently in no pain during subjective. when LBP is flared up he reports 4-5/10 NPS. Worst pain is 5-6/10 NPS but pt is unable to explain what causes worst pain but reports possibly most pain due sitting for long periods of time. Prednisone has improved LBP. Pt reports his goals in therapy are to improve balance although he did not mention to Dr. Sabra [name] when referred to PT for his LBP. Also wants to improve his strength. He reports he requires objects to help support him when walking such as walls or furniture. Pt denies falls, but  spouse reports a fall last fall at home where he tripped over a blanket. Has reported near falls where he needed touching of an object to correct. Pt uses a stick now and then for balance but does report difficulty transferring from sturdy to unstardy surfaces (i.e. stepping over a stream) and walking stick helps.    Limitations Standing;Walking;House hold activities    How long can you sit comfortably? Unlimited    How long can you stand comfortably? Unlimited  How long can you walk comfortably? ~1/2 mile    Patient Stated Goals Improve his balance, LE strength, hip/knee pain and stiffness    Pain Onset In the past 7 days             Subjective   There were no vitals filed for this visit.   Interventions: CGA provided throughout unless otherwise indicated   Therex:   Treadmill for lower extremity muscle and cardiorespiratory endurance.  The patient ambulates up to 2.8 mph, total duration of intervention - 9 min. Continued cues for upright posture, increased heel strike to clear LEs. VC for keeping steps close to the front of the treadmill to improve posture and safety.   STS with no UE support 2 x 10 reps. Pt displayed noticeable  fatigue, especially with second set. Pt states his right knee feels like it "wants to give out"   Unweighted LAQ x 10 reps to loosen up the right leg.   Terminal knee extension with RTB x 12 reps on RLE.   Seated physioball forward/backward rollouts x multiple reps each way  Seated physioball lateral rollouts (B) x multiple reps each way. Pt completed before and after intervention.  Seated cervical rotation stretch 2 x 30 sec each side. Pt instructed not to stretch to the point of pain.   Neuromuscular Re-ed:   Standing on airex NBOS 2 x 60 sec. Pt displays mild sway posteriorly and laterally, but is able to correct without UE support.  Standing on airex NBOS 2 x 60 sec sec with horizontal head turns. Pt with mild lateral sway needing UE support to regain balance x 3 times.   Standing on airex NBOS 2 x 60 sec with vertical head turns. Pt with backward LOB x multiple times requiring Min A from SPT and intermittent UE support. Pt more challenged with vertical head turns.   Standing on airex NBOS, eyes closed 3 x 60 sec. Pt with moderate posterior sway needing intermittent UE support and Min A from SPT to regain balance. Pt improves with trials             PT Education - 02/07/21 1154     Education Details exercise technique, body mechanics    Person(s) Educated Patient    Methods Explanation;Demonstration;Verbal cues    Comprehension Verbalized understanding;Returned demonstration;Verbal cues required;Need further instruction              PT Short Term Goals - 01/28/21 1426       PT SHORT TERM GOAL #1   Title Patient will be independent in home exercise program to improve strength/mobility for better functional independence with ADLs.    Baseline 3/3: established HEP today. See medbridge access code; 06/07/2020 pt reports doing some of his HEP, but that he has difficulty with some of the supine exercises; 4/28: pt not yet fully indep. with HEP; 5/10: pt inconsistently  performing HEP; 6/20: doing HEP 3x a week; 9/15: has been walking 30 min 3-5 days a week; 11/17: reports completing his HEP 2-3 times a week, but also inconsistently    Time 4    Period Weeks    Status Achieved    Target Date 08/02/20               PT Long Term Goals - 01/28/21 1427       PT LONG TERM GOAL #1   Title Patient will increase FOTO score equal to or greater than 69 to demonstrate statistically significant improvement in  mobility and quality of life.    Baseline 3/3: 55; 3/31 59%; 4/28: 60%; 5/9: 56%, 6/20: 63%; 7/21: 53% (impacted by pt recurrent onset of LBP after lifting heavy furniture); 9/1: 58%; 11/17: 56%    Time 8    Period Weeks    Status On-going    Target Date 03/21/21      PT LONG TERM GOAL #2   Title Patient (> 62 years old) will complete five times sit to stand test in < 12 seconds indicating an increased LE strength and improved balance.    Baseline 3/3: 15.38 m/s; 06/07/2020:  11 sec, 6/20: 13.5 sec with arms across chest; 7/21: 12.3 sec; 9/1: 11.7 sec    Time 8    Period Weeks    Status Achieved    Target Date 03/21/21      PT LONG TERM GOAL #3   Title Pt will improve hip extension AROM > 10 deg bilaterally to normalize reciprocal gait and balance.    Baseline 3/3: R/L 4 deg/3 deg; 3/31 R/L 5 deg/3 deg; 4/28: 5 deg R, 0 deg. L; 4/9: 4-5 deg. ext B, 6/20: 8 degrees bilaterally; 7/21: L hip 8 de.g, R hip 5 deg.; 9/1: L 7 deg., R 9 deg; 11/17: L 9 deg., R 7 deg.    Time 8    Period Weeks    Status On-going    Target Date 03/21/21      PT LONG TERM GOAL #4   Title Pt will improve Berg score > 46 to display decreased risk of falls.    Baseline 3/3: Will assess upon MD approval; 06/07/2020 52/56; 4/28: 55/56    Time 8    Period Weeks    Status Achieved      PT LONG TERM GOAL #5   Title The patient will be able to maintain at least 10 seconds of SLB on BLEs in order to decrease risk of falls when navigating obstacles, curbs and steps.    Baseline  06/07/2020: pt unable to maintain more than 3 sec of SLB without UE support; 4/28: 15 sec BLEs; 9/1: 9 sec R, 5 sec L    Time 8    Period Weeks    Status Achieved      PT LONG TERM GOAL #6   Title Patient will increase dynamic gait index score to >20/24 as to demonstrate reduced fall risk and improved dynamic gait balance for better safety with community/home ambulation.    Baseline 4/28: 17/24; 5/9: 19/24, 6/20: 20/24; 7/21: 22/24    Time 8    Period Weeks    Status Achieved                   Plan - 02/07/21 1157     Clinical Impression Statement Pt tolerated all strengthening and balance exercises well today. Pt back stiffness improved following ball roll out and treadmill walking. Pt able to increase speed on treadmill walking today, but could only sustain 2.8 mph for a 30 second period, with pt needing continued cuing to stay closer to the front of the belt. Pt seemed more impulsive today requiring closer CGA for most interventions. Pt continues to be challenged with dual task balance activities, specifically with vertical head turns. The pt will benefit from skilled PT to improve pain, strength, and balance to decrease fall risk and improve quality of life.    Personal Factors and Comorbidities Age;Comorbidity 3+;Past/Current Experience    Comorbidities A-fib, lumbar disc disease, prostate  cancer, and  history of TIA.    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    Rehab Potential Good    PT Frequency 1x / week    PT Duration 8 weeks    PT Treatment/Interventions ADLs/Self Care Home Management;Biofeedback;Aquatic Therapy;Canalith Repostioning;Cryotherapy;Electrical Stimulation;Iontophoresis 59m/ml Dexamethasone;Moist Heat;Traction;Ultrasound;Gait training;Stair training;DME Instruction;Functional  mobility training;Therapeutic activities;Therapeutic exercise;Balance training;Neuromuscular re-education;Patient/family education;Manual techniques;Passive range of motion;Dry needling;Energy conservation;Vestibular;Joint Manipulations    PT Next Visit Plan Prepare for DC: most LT goals met or partially met; update HEP as needed.  Addition of endurance exercise on treadmill    PT Home Exercise Plan access code: [email or web address] added quadruped knee flexion stretch and half stretch off a step 7/21, no updates on this date    Consulted and Agree with Plan of Care Patient             Patient will benefit from skilled therapeutic intervention in order to improve the following deficits and impairments:  Abnormal gait, Decreased balance, Decreased range of motion, Decreased strength, Hypomobility, Impaired sensation, Pain, Improper body mechanics, Impaired flexibility, Decreased mobility  Visit Diagnosis: Unsteadiness on feet  Muscle weakness (generalized)  Other abnormalities of gait and mobility     Problem List There are no problems to display for this patient. HRicard DillonPT, DPT This entire session was performed under direct supervision and direction of a licensed therapist/therapist assistant . I have personally read, edited and approve of the note as written.   SKarn Cassis SPT  02/07/2021, 5:11 PM  [place] [facility] [address] [address]Phone: [phone]  Fax:  [phone] Name: CMICA RELEFORDMRN: [id number] of Birth: [birth year]

## 2021-02-11 ENCOUNTER — Ambulatory Visit: Payer: Medicare Other

## 2021-02-14 ENCOUNTER — Ambulatory Visit: Payer: Medicare Other

## 2021-02-14 ENCOUNTER — Other Ambulatory Visit: Payer: Self-pay

## 2021-02-14 DIAGNOSIS — A4189 Other specified sepsis: Secondary | ICD-10-CM | POA: Diagnosis not present

## 2021-02-14 DIAGNOSIS — R531 Weakness: Secondary | ICD-10-CM | POA: Diagnosis not present

## 2021-02-14 DIAGNOSIS — M6281 Muscle weakness (generalized): Secondary | ICD-10-CM

## 2021-02-14 DIAGNOSIS — G8929 Other chronic pain: Secondary | ICD-10-CM

## 2021-02-14 DIAGNOSIS — R2681 Unsteadiness on feet: Secondary | ICD-10-CM

## 2021-02-14 NOTE — Therapy (Signed)
[place] MAIN [facility] SERVICES [address] [place], [name], [address] Phone: [phone]   Fax:  [phone]  Physical Therapy Treatment  Patient Details  Name: Nicholas [name] MRN: [id number] Date of Birth: [birth year] Referring Provider (PT): Rusty Aus, MD   Encounter Date: 02/14/2021   PT End of Session - 02/14/21 1546     Visit Number 62    Number of Visits 75    Date for PT Re-Evaluation 03/21/21    Authorization Type [employer], BCBS supplemental    Authorization Time Period 01/17/21-03/21/2021    Progress Note Due on Visit 67    PT Start Time 1002    PT Stop Time 1046    PT Time Calculation (min) 44 min    Equipment Utilized During Treatment Gait belt    Activity Tolerance Patient tolerated treatment well    Behavior During Therapy [facility] for tasks assessed/performed             Past Medical History:  Diagnosis Date   A-fib ([place])    Cancer ([place]) 10/25/2019   Prostate   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   Subjective Assessment - 02/14/21 1002     Subjective Pt reports low back feels stiff and sore. Pt reports he pushed a cart for an hour and had to pick up stuff on lower shelf but is otherwise unsure as to what could have bothered his back.    Pertinent History Pt is a pleasant 75 y.o. male referred to PT for LBP which occured early February. Pt has a PMH of prostate cx, TIA, chronic a-fib, and recent hematuria. Pt has pain with t/f from STS but feels better with  walking. Pt states his LBP has been improving but his knees and hips have neem giving him more pain. Pt's LBP is a strong ache that is on the R side but denies radicular symptoms. Pt reports exercises given by MD have been beneficial and improving his LBP symptoms. Currently in no pain during subjective. when LBP is flared up he reports 4-5/10 NPS. Worst pain is 5-6/10 NPS but pt is unable to explain what causes worst pain but reports possibly most pain due sitting for long periods of time. Prednisone has improved LBP. Pt reports his goals in therapy are to improve balance although he did not mention to Dr. Sabra [name] when referred to PT for his LBP. Also wants to improve his strength. He reports he requires objects to help support him when walking such as walls or furniture. Pt denies falls, but  spouse reports a fall last fall at home where he tripped over a blanket. Has reported near falls where he needed touching of an object to correct. Pt uses a stick now and then for balance but does report difficulty transferring from sturdy to unstardy surfaces (i.e. stepping over a stream) and walking stick helps.    Limitations Standing;Walking;House hold activities    How long can you sit  comfortably? Unlimited    How long can you stand comfortably? Unlimited    How long can you walk comfortably? ~1/2 mile    Patient Stated Goals Improve his balance, LE strength, hip/knee pain and stiffness    Currently in Pain? Yes    Pain Location Back    Pain Orientation Lower    Pain Onset In the past 7 days              Interventions:    Therex: Seated figure four stretch - 2x30 sec each LE  Seated hamstring - 2x30 sec each LE  P.ball rollouts forward/backward and side-to-side with 1-3 sec holds at end range - x multiple reps of each.  Pt reports this improves his low back sx.   Standing hip flexor stretch 2x30 sec RLE, 1x30 sec LLE. Discontinued as pt repots increase in low back sx.   Standing calf stretch  60 sec B   Mini squats 2x12, cuing for increased hip flexion. No symptoms.  Pt supine in mat-table: LTRs - 2x20, performed within pain-free range. TC/VC for technique. Supine p.ball hamstring curls 2x16 Knee to chest stretch - 2x30-45 sec each LE and addition 60 sec stretch on R as pt reports feeling "pull" and improvement on R. Supine marches - 2x10 each LE Open book - attempts 10x side-lying on R, however discontinued as pt reports increased low back symptoms. Pt performs 8 glute bridges for pain modulation  Supine piriformis stretch - 2x30 sec each LE  Eneded with seated p.ball rollouts forward/backward 10x - pt reports improvement in pain  Pt educated throughout session about proper posture and technique with exercises. Improved exercise technique, movement at target joints, use of target muscles after min to mod verbal, visual, tactile cues.     PT Education - 02/14/21 1545     Education Details exercise technique, body mechanics    Person(s) Educated Patient    Methods Explanation;Demonstration;Tactile cues;Verbal cues    Comprehension Verbalized understanding;Returned demonstration;Verbal cues required;Need further instruction              PT Short Term Goals - 01/28/21 1426       PT SHORT TERM GOAL #1   Title Patient will be independent in home exercise program to improve strength/mobility for better functional independence with ADLs.    Baseline 3/3: established HEP today. See medbridge access code; 06/07/2020 pt reports doing some of his HEP, but that he has difficulty with some of the supine exercises; 4/28: pt not yet fully indep. with HEP; 5/10: pt inconsistently performing HEP; 6/20: doing HEP 3x a week; 9/15: has been walking 30 min 3-5 days a week; 11/17: reports completing his HEP 2-3 times a week, but also inconsistently    Time 4    Period Weeks    Status Achieved    Target Date 08/02/20               PT Long Term Goals - 01/28/21 1427       PT  LONG TERM GOAL #1   Title Patient will increase FOTO score equal to or greater than 69 to demonstrate statistically significant improvement in mobility and quality of life.    Baseline 3/3: 55; 3/31 59%; 4/28: 60%; 5/9: 56%, 6/20: 63%; 7/21: 53% (impacted by pt recurrent onset of LBP after lifting heavy furniture); 9/1: 58%; 11/17: 56%    Time 8    Period Weeks    Status On-going    Target Date 03/21/21  PT LONG TERM GOAL #2   Title Patient (> 29 years old) will complete five times sit to stand test in < 12 seconds indicating an increased LE strength and improved balance.    Baseline 3/3: 15.38 m/s; 06/07/2020:  11 sec, 6/20: 13.5 sec with arms across chest; 7/21: 12.3 sec; 9/1: 11.7 sec    Time 8    Period Weeks    Status Achieved    Target Date 03/21/21      PT LONG TERM GOAL #3   Title Pt will improve hip extension AROM > 10 deg bilaterally to normalize reciprocal gait and balance.    Baseline 3/3: R/L 4 deg/3 deg; 3/31 R/L 5 deg/3 deg; 4/28: 5 deg R, 0 deg. L; 4/9: 4-5 deg. ext B, 6/20: 8 degrees bilaterally; 7/21: L hip 8 de.g, R hip 5 deg.; 9/1: L 7 deg., R 9 deg; 11/17: L 9 deg., R 7 deg.    Time 8    Period Weeks    Status On-going    Target Date 03/21/21      PT LONG TERM GOAL #4   Title Pt will improve Berg score > 46 to display decreased risk of falls.    Baseline 3/3: Will assess upon MD approval; 06/07/2020 52/56; 4/28: 55/56    Time 8    Period Weeks    Status Achieved      PT LONG TERM GOAL #5   Title The patient will be able to maintain at least 10 seconds of SLB on BLEs in order to decrease risk of falls when navigating obstacles, curbs and steps.    Baseline 06/07/2020: pt unable to maintain more than 3 sec of SLB without UE support; 4/28: 15 sec BLEs; 9/1: 9 sec R, 5 sec L    Time 8    Period Weeks    Status Achieved      PT LONG TERM GOAL #6   Title Patient will increase dynamic gait index score to >20/24 as to demonstrate reduced fall risk and improved  dynamic gait balance for better safety with community/home ambulation.    Baseline 4/28: 17/24; 5/9: 19/24, 6/20: 20/24; 7/21: 22/24    Time 8    Period Weeks    Status Achieved                   Plan - 02/14/21 1554     Clinical Impression Statement Pt reports difficulty with bending so PT instructs pt in mini-squats (to be progressed). Pt does require cuing for technique as pt with minimal hip hinge and insufficient DF with increased knee flexion with movement. Pt had low back sx with open book and standing hip flexor stretch, so these interventions were discontinued at this time. Pt reported improvement in symptoms following knee to chest stretch (particularly on R) and with p.ball rollouts. PT discussed importance of utilizing his HEP for pain-relieving movements. The pt will benefit from further skilled PT to improve pain, strength, balance and general functional mobility to increase ease and safety with ADLs.    Personal Factors and Comorbidities Age;Comorbidity 3+;Past/Current Experience    Comorbidities A-fib, lumbar disc disease, prostate cancer, and  history of TIA.    Examination-Activity Limitations Reach Overhead;Stand;Bend;Lift;Locomotion Level;Squat    Examination-Participation Restrictions Community Activity;Yard Work    Stability/Clinical Decision Making Evolving/Moderate complexity    Rehab Potential Good    PT Frequency 1x / week    PT Duration 8 weeks    PT Treatment/Interventions ADLs/Self  Care Home Management;Biofeedback;Aquatic Therapy;Canalith Repostioning;Cryotherapy;Electrical Stimulation;Iontophoresis 66m/ml Dexamethasone;Moist Heat;Traction;Ultrasound;Gait training;Stair training;DME Instruction;Functional mobility training;Therapeutic activities;Therapeutic exercise;Balance training;Neuromuscular re-education;Patient/family education;Manual techniques;Passive range of motion;Dry needling;Energy conservation;Vestibular;Joint Manipulations    PT Next Visit Plan  Prepare for DC: most LT goals met or partially met; update HEP as needed.  Addition of endurance exercise on treadmill, prone press-ups, deadlifts and squats    PT Home Exercise Plan access code: [email or web address] added quadruped knee flexion stretch and half stretch off a step 7/21, no updates on this date    Consulted and Agree with Plan of Care Patient             Patient will benefit from skilled therapeutic intervention in order to improve the following deficits and impairments:  Abnormal gait, Decreased balance, Decreased range of motion, Decreased strength, Hypomobility, Impaired sensation, Pain, Improper body mechanics, Impaired flexibility, Decreased mobility  Visit Diagnosis: Chronic bilateral low back pain, unspecified whether sciatica present  Muscle weakness (generalized)  Unsteadiness on feet     Problem List There are no problems to display for this patient.   HZollie Pee PT 02/14/2021, 4:00 PM  [facility] [place] [address] [place] [address]Phone: [phone]  Fax:  [phone] Name: Nicholas ASHRAFMRN: [id number] of Birth: 130-Jan-1947

## 2021-02-17 ENCOUNTER — Inpatient Hospital Stay
Admission: EM | Admit: 2021-02-17 | Discharge: 2021-02-23 | DRG: 871 | Disposition: A | Discharge status: Home Health | Payer: Medicare Other | Attending: Internal Medicine | Admitting: Internal Medicine

## 2021-02-17 ENCOUNTER — Other Ambulatory Visit: Payer: Self-pay

## 2021-02-17 ENCOUNTER — Encounter: Payer: Self-pay | Admitting: Emergency Medicine

## 2021-02-17 ENCOUNTER — Emergency Department: Payer: Medicare Other

## 2021-02-17 DIAGNOSIS — Z86711 Personal history of pulmonary embolism: Secondary | ICD-10-CM

## 2021-02-17 DIAGNOSIS — J9601 Acute respiratory failure with hypoxia: Secondary | ICD-10-CM | POA: Diagnosis present

## 2021-02-17 DIAGNOSIS — Z8249 Family history of ischemic heart disease and other diseases of the circulatory system: Secondary | ICD-10-CM

## 2021-02-17 DIAGNOSIS — Z888 Allergy status to other drugs, medicaments and biological substances status: Secondary | ICD-10-CM | POA: Diagnosis not present

## 2021-02-17 DIAGNOSIS — Z85828 Personal history of other malignant neoplasm of skin: Secondary | ICD-10-CM | POA: Diagnosis not present

## 2021-02-17 DIAGNOSIS — Z9049 Acquired absence of other specified parts of digestive tract: Secondary | ICD-10-CM

## 2021-02-17 DIAGNOSIS — Z8546 Personal history of malignant neoplasm of prostate: Secondary | ICD-10-CM

## 2021-02-17 DIAGNOSIS — A4189 Other specified sepsis: Secondary | ICD-10-CM | POA: Diagnosis present

## 2021-02-17 DIAGNOSIS — I700 Atherosclerosis of aorta: Secondary | ICD-10-CM | POA: Diagnosis present

## 2021-02-17 DIAGNOSIS — M1A9XX0 Chronic gout, unspecified, without tophus (tophi): Secondary | ICD-10-CM | POA: Diagnosis present

## 2021-02-17 DIAGNOSIS — W19XXXA Unspecified fall, initial encounter: Secondary | ICD-10-CM

## 2021-02-17 DIAGNOSIS — E871 Hypo-osmolality and hyponatremia: Secondary | ICD-10-CM | POA: Diagnosis present

## 2021-02-17 DIAGNOSIS — Z6830 Body mass index (BMI) 30.0-30.9, adult: Secondary | ICD-10-CM

## 2021-02-17 DIAGNOSIS — Z880 Allergy status to penicillin: Secondary | ICD-10-CM | POA: Diagnosis not present

## 2021-02-17 DIAGNOSIS — G459 Transient cerebral ischemic attack, unspecified: Secondary | ICD-10-CM | POA: Diagnosis present

## 2021-02-17 DIAGNOSIS — Z8673 Personal history of transient ischemic attack (TIA), and cerebral infarction without residual deficits: Secondary | ICD-10-CM

## 2021-02-17 DIAGNOSIS — W1830XA Fall on same level, unspecified, initial encounter: Secondary | ICD-10-CM | POA: Diagnosis present

## 2021-02-17 DIAGNOSIS — I4891 Unspecified atrial fibrillation: Secondary | ICD-10-CM | POA: Diagnosis present

## 2021-02-17 DIAGNOSIS — R600 Localized edema: Secondary | ICD-10-CM | POA: Diagnosis present

## 2021-02-17 DIAGNOSIS — M6282 Rhabdomyolysis: Secondary | ICD-10-CM | POA: Diagnosis present

## 2021-02-17 DIAGNOSIS — E872 Acidosis, unspecified: Secondary | ICD-10-CM | POA: Diagnosis present

## 2021-02-17 DIAGNOSIS — Z7901 Long term (current) use of anticoagulants: Secondary | ICD-10-CM | POA: Diagnosis not present

## 2021-02-17 DIAGNOSIS — U071 COVID-19: Secondary | ICD-10-CM | POA: Diagnosis present

## 2021-02-17 DIAGNOSIS — I959 Hypotension, unspecified: Secondary | ICD-10-CM | POA: Diagnosis present

## 2021-02-17 DIAGNOSIS — Z7982 Long term (current) use of aspirin: Secondary | ICD-10-CM

## 2021-02-17 DIAGNOSIS — I4821 Permanent atrial fibrillation: Secondary | ICD-10-CM | POA: Diagnosis present

## 2021-02-17 DIAGNOSIS — Z79899 Other long term (current) drug therapy: Secondary | ICD-10-CM

## 2021-02-17 DIAGNOSIS — R531 Weakness: Secondary | ICD-10-CM | POA: Diagnosis present

## 2021-02-17 DIAGNOSIS — R55 Syncope and collapse: Secondary | ICD-10-CM | POA: Diagnosis not present

## 2021-02-17 LAB — URINALYSIS, ROUTINE W REFLEX MICROSCOPIC
Bacteria, UA: NONE SEEN
Bilirubin Urine: NEGATIVE
Glucose, UA: NEGATIVE mg/dL
Ketones, ur: NEGATIVE mg/dL
Leukocytes,Ua: NEGATIVE
Nitrite: NEGATIVE
Protein, ur: NEGATIVE mg/dL
Specific Gravity, Urine: >1.046 — ABNORMAL HIGH (ref 1.005–1.030)
Squamous Epithelial / HPF: NONE SEEN (ref 0–5)
pH: 5 (ref 5.0–8.0)

## 2021-02-17 LAB — COMPREHENSIVE METABOLIC PANEL
ALT: 19 U/L (ref 0–44)
AST: 40 U/L (ref 15–41)
Albumin: 3.7 g/dL (ref 3.5–5.0)
Alkaline Phosphatase: 48 U/L (ref 38–126)
Anion gap: 10 (ref 5–15)
BUN: 13 mg/dL (ref 8–23)
CO2: 19 mmol/L — ABNORMAL LOW (ref 22–32)
Calcium: 8.7 mg/dL — ABNORMAL LOW (ref 8.9–10.3)
Chloride: 104 mmol/L (ref 98–111)
Creatinine, Ser: 1.07 mg/dL (ref 0.61–1.24)
GFR, Estimated: >60 mL/min (ref >60)
Glucose, Bld: 88 mg/dL (ref 70–99)
Potassium: 3.7 mmol/L (ref 3.5–5.1)
Sodium: 133 mmol/L — ABNORMAL LOW (ref 135–145)
Total Bilirubin: 0.8 mg/dL (ref 0.3–1.2)
Total Protein: 5.9 g/dL — ABNORMAL LOW (ref 6.5–8.1)

## 2021-02-17 LAB — CBC WITH DIFFERENTIAL/PLATELET
Abs Immature Granulocytes: 0.03 10*3/uL (ref 0.00–0.07)
Basophils Absolute: 0.1 10*3/uL (ref 0.0–0.1)
Basophils Relative: 1 %
Eosinophils Absolute: 0 10*3/uL (ref 0.0–0.5)
Eosinophils Relative: 0 %
HCT: 41.7 % (ref 39.0–52.0)
Hemoglobin: 13.9 g/dL (ref 13.0–17.0)
Immature Granulocytes: 0 %
Lymphocytes Relative: 6 %
Lymphs Abs: 0.5 10*3/uL — ABNORMAL LOW (ref 0.7–4.0)
MCH: 32.1 pg (ref 26.0–34.0)
MCHC: 33.3 g/dL (ref 30.0–36.0)
MCV: 96.3 fL (ref 80.0–100.0)
Monocytes Absolute: 1 10*3/uL (ref 0.1–1.0)
Monocytes Relative: 14 %
Neutro Abs: 5.8 10*3/uL (ref 1.7–7.7)
Neutrophils Relative %: 79 %
Platelets: 202 10*3/uL (ref 150–400)
RBC: 4.33 MIL/uL (ref 4.22–5.81)
RDW: 12.4 % (ref 11.5–15.5)
WBC: 7.4 10*3/uL (ref 4.0–10.5)
nRBC: 0 % (ref 0.0–0.2)

## 2021-02-17 LAB — LACTIC ACID, PLASMA
Lactic Acid, Venous: 3.4 mmol/L (ref 0.5–1.9)
Lactic Acid, Venous: 2 mmol/L (ref 0.5–1.9)

## 2021-02-17 LAB — RESP PANEL BY RT-PCR (FLU A&B, COVID) ARPGX2
Influenza A by PCR: NEGATIVE
Influenza B by PCR: NEGATIVE
SARS Coronavirus 2 by RT PCR: POSITIVE — AB

## 2021-02-17 LAB — BRAIN NATRIURETIC PEPTIDE: B Natriuretic Peptide: 382.3 pg/mL — ABNORMAL HIGH (ref 0.0–100.0)

## 2021-02-17 LAB — TROPONIN I (HIGH SENSITIVITY)
Troponin I (High Sensitivity): 25 ng/L — ABNORMAL HIGH (ref <18)
Troponin I (High Sensitivity): 31 ng/L — ABNORMAL HIGH (ref <18)

## 2021-02-17 LAB — PROCALCITONIN: Procalcitonin: <0.1 ng/mL

## 2021-02-17 LAB — GROUP A STREP BY PCR: Group A Strep by PCR: NOT DETECTED

## 2021-02-17 MED ORDER — IOHEXOL 300 MG/ML  SOLN
100.0000 mL | Freq: Once | INTRAMUSCULAR | Status: AC | PRN
  Administered 2021-02-17: 100 mL via INTRAVENOUS

## 2021-02-17 MED ORDER — ONDANSETRON HCL 4 MG PO TABS: 4.0000 mg | ORAL_TABLET | Freq: Four times a day (QID) | ORAL | Status: DC | PRN

## 2021-02-17 MED ORDER — COLCHICINE 0.6 MG PO TABS
0.6000 mg | ORAL_TABLET | Freq: Every day | ORAL | Status: DC | PRN
  Administered 2021-02-21 – 2021-02-22 (×3): 0.6 mg via ORAL
  Filled 2021-02-17 (×5): qty 1

## 2021-02-17 MED ORDER — ACETAMINOPHEN 325 MG PO TABS
650.0000 mg | ORAL_TABLET | Freq: Four times a day (QID) | ORAL | Status: DC | PRN
  Administered 2021-02-18 – 2021-02-21 (×3): 650 mg via ORAL
  Filled 2021-02-17 (×3): qty 2

## 2021-02-17 MED ORDER — FEBUXOSTAT 40 MG PO TABS
40.0000 mg | ORAL_TABLET | Freq: Every day | ORAL | Status: DC
  Administered 2021-02-17 – 2021-02-23 (×7): 40 mg via ORAL
  Filled 2021-02-17 (×7): qty 1

## 2021-02-17 MED ORDER — SODIUM CHLORIDE 0.9% FLUSH
3.0000 mL | Freq: Two times a day (BID) | INTRAVENOUS | Status: DC
  Administered 2021-02-17 – 2021-02-23 (×12): 3 mL via INTRAVENOUS

## 2021-02-17 MED ORDER — SODIUM CHLORIDE 0.9 % IV SOLN: 250.0000 mL | INTRAVENOUS | Status: DC | PRN

## 2021-02-17 MED ORDER — LACTATED RINGERS IV BOLUS
1000.0000 mL | Freq: Once | INTRAVENOUS | Status: AC
  Administered 2021-02-17: 1000 mL via INTRAVENOUS

## 2021-02-17 MED ORDER — SODIUM CHLORIDE 0.9% FLUSH: 3.0000 mL | INTRAVENOUS | Status: DC | PRN

## 2021-02-17 MED ORDER — ASPIRIN EC 81 MG PO TBEC
81.0000 mg | DELAYED_RELEASE_TABLET | Freq: Every day | ORAL | Status: DC
  Administered 2021-02-17 – 2021-02-23 (×7): 81 mg via ORAL
  Filled 2021-02-17 (×7): qty 1

## 2021-02-17 MED ORDER — ACETAMINOPHEN 650 MG RE SUPP: 650.0000 mg | Freq: Four times a day (QID) | RECTAL | Status: DC | PRN

## 2021-02-17 MED ORDER — RIVAROXABAN 20 MG PO TABS
20.0000 mg | ORAL_TABLET | Freq: Every day | ORAL | Status: DC
  Administered 2021-02-17 – 2021-02-23 (×7): 20 mg via ORAL
  Filled 2021-02-17 (×7): qty 1

## 2021-02-17 MED ORDER — ONDANSETRON HCL 4 MG/2ML IJ SOLN: 4.0000 mg | Freq: Four times a day (QID) | INTRAMUSCULAR | Status: DC | PRN

## 2021-02-17 MED ORDER — SODIUM CHLORIDE 0.9 % IV BOLUS
1000.0000 mL | Freq: Once | INTRAVENOUS | Status: AC
  Administered 2021-02-17: 1000 mL via INTRAVENOUS

## 2021-02-17 NOTE — ED Notes (Signed)
Pt called wife to give update.

## 2021-02-17 NOTE — ED Notes (Signed)
Patient is resting comfortably. C/o mild chest pain 3/10 with cough. Patient states "Does Covid affect your thinking." Alert and awake. Orientated x 4. Afib HR 80's. Saline locked. No new concerns or needs at this time.

## 2021-02-17 NOTE — ED Provider Notes (Addendum)
[facility] Emergency Department Provider Note   ____________________________________________   Event Date/Time   First MD Initiated Contact with Patient 02/17/21 [phone]     (approximate)  I have reviewed the triage vital signs and the nursing notes.   HISTORY  Chief Complaint Fall    HPI Nicholas [name] is a 75 y.o. male who comes from home.  He reports he was very weak this morning and fell and could not get up.  This happened about 530.  He reports he has been getting weaker for the last 24 hours or so.  He has had a dry nonproductive cough.  He has not been short of breath.  He has had some right-sided lower back pain but this is been ongoing for months and he in fact is getting physical therapy for it.  He has not been running a fever.  He has no other aches or pains elsewhere.  He reports some burning in his throat and its about the extent of his symptoms.        Past Medical History:  Diagnosis Date   A-fib 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   Dysrhythmia    Pulmonary embolism ([facility])    Skin cancer    TIA (transient ischemic attack)     Patient Active Problem List   Diagnosis Date Noted   COVID-19 virus infection 02/17/2021   A-fib ([place])    TIA (transient ischemic attack)    Lactic acidosis    Hyponatremia    Hypotension    Generalized weakness      Past Surgical History:  Procedure Laterality Date   APPENDECTOMY     COLONOSCOPY WITH PROPOFOL N/A 11/23/2019   Procedure: COLONOSCOPY WITH PROPOFOL;  Surgeon: Toledo, Benay Pike, MD;  Location: [facility] ENDOSCOPY;  Service: Gastroenterology;  Laterality: N/A;   cyber knife surgery     PROSTATE SURGERY      Prior to Admission medications   Medication Sig Start Date End Date Taking? Authorizing Provider  acetaminophen (TYLENOL) 325 MG tablet Take 650 mg by mouth every 6 (six) hours as needed.   Yes [provider]  aspirin EC 81 MG  tablet Take 81 mg by mouth daily. Swallow whole.   Yes [provider]  atenolol (TENORMIN) 25 MG tablet Take 12.5 mg by mouth 2 (two) times daily. 07/16/19  Yes [provider]  colchicine 0.6 MG tablet Take 0.6 mg by mouth as needed.   Yes [provider]  febuxostat (ULORIC) 40 MG tablet Take 40 mg by mouth daily. 06/20/19  Yes [provider]  hydrocortisone 2.5 % cream Apply topically daily. Tuesday, Thursday, Saturday 09/05/20  Yes Ralene Bathe, MD  ketoconazole (NIZORAL) 2 % cream Apply 1 application topically daily. Monday, Wednesday, Friday 09/05/20 08/26/22 Yes Ralene Bathe, MD  multivitamin-lutein [facility]) CAPS capsule Take 1 capsule by mouth daily.   Yes [provider]  XARELTO 20 MG TABS tablet Take 20 mg by mouth daily. 06/14/19  Yes [provider]    Allergies Penicillins and Allopurinol  Family History  Problem Relation Age of Onset   Hypertension Mother     Social History Social History   Tobacco Use   Smoking status: Never   Smokeless tobacco: Never  Vaping Use   Vaping Use: Never used  Substance Use Topics   Alcohol use: Never   Drug use: Never    Review of  Systems  Constitutional: No fever/chills Eyes: No visual changes. ENT: No sore throat. Cardiovascular: Denies chest pain. Respiratory: Denies shortness of breath. Gastrointestinal: No abdominal pain.  No nausea, no vomiting.  No diarrhea.  No constipation. Genitourinary: Negative for dysuria. Musculoskeletal: Chronic right lower back pain. Skin: Negative for rash. Neurological: Negative for headaches, focal weakness   ____________________________________________   PHYSICAL EXAM:  VITAL SIGNS: ED Triage Vitals [02/17/21 0748]  Enc Vitals Group     BP 100/62     Pulse Rate 96     Resp 20     Temp 97.7 F (36.5 C)     Temp Source Oral     SpO2 94 %     Weight 253 lb (114.8 kg)     Height 6\' 4"  (1.93 m)     Head  Circumference      Peak Flow      Pain Score 0     Pain Loc      Pain Edu?      Excl. in [place]?     Constitutional: Alert and oriented. Well appearing and in no acute distress. Eyes: Conjunctivae are normal. PER Head: Atraumatic. Nose: No congestion/rhinnorhea. Mouth/Throat: Mucous membranes are moist.  Oropharynx non-erythematous although I could not see all the way back to the posterior pharynx. Neck: No stridor.  Cardiovascular: Normal rate, regular rhythm. Grossly normal heart sounds.  Good peripheral circulation. Respiratory: Normal respiratory effort.  No retractions. Lungs CTAB. Gastrointestinal: Soft and nontender. No distention. No abdominal bruits. No CVA tenderness. Musculoskeletal: No lower extremity tenderness nor edema.  Neurologic:  Normal speech and language. No gross focal neurologic deficits are appreciated.  Skin:  Skin is warm, dry and intact. No rash noted.   ____________________________________________   LABS (all labs ordered are listed, but only abnormal results are displayed)  Labs Reviewed  RESP PANEL BY RT-PCR (FLU A&B, COVID) ARPGX2 - Abnormal; Notable for the following components:      Result Value   SARS Coronavirus 2 by RT PCR POSITIVE (*)    All other components within normal limits  COMPREHENSIVE METABOLIC PANEL - Abnormal; Notable for the following components:   Sodium 133 (*)    CO2 19 (*)    Calcium 8.7 (*)    Total Protein 5.9 (*)    All other components within normal limits  LACTIC ACID, PLASMA - Abnormal; Notable for the following components:   Lactic Acid, Venous 3.4 (*)    All other components within normal limits  CBC WITH DIFFERENTIAL/PLATELET - Abnormal; Notable for the following components:   Lymphs Abs 0.5 (*)    All other components within normal limits  URINALYSIS, ROUTINE W REFLEX MICROSCOPIC - Abnormal; Notable for the following components:   Color, Urine YELLOW (*)    APPearance CLEAR (*)    Specific Gravity, Urine >1.046  (*)    Hgb urine dipstick SMALL (*)    All other components within normal limits  BRAIN NATRIURETIC PEPTIDE - Abnormal; Notable for the following components:   B Natriuretic Peptide 382.3 (*)    All other components within normal limits  LACTIC ACID, PLASMA - Abnormal; Notable for the following components:   Lactic Acid, Venous 2.0 (*)    All other components within normal limits  TROPONIN I (HIGH SENSITIVITY) - Abnormal; Notable for the following components:   Troponin I (High Sensitivity) 25 (*)    All other components within normal limits  TROPONIN I (HIGH SENSITIVITY) - Abnormal; Notable for the following  components:   Troponin I (High Sensitivity) 31 (*)    All other components within normal limits  GROUP A STREP BY PCR  PROCALCITONIN   ____________________________________________  EKG  EKG read interpreted by me shows A. fib at a rate of 88 normal axis irregular baseline flipped T's in aVF and and V3 through 6.  No apparent ST elevation or depression.  Flipped T waves were present on EKG from November this year. ____________________________________________  RADIOLOGY Gertha Calkin, personally viewed and evaluated these images (plain radiographs) as part of my medical decision making, as well as reviewing the written report by the radiologist.  ED MD interpretation: Chest x-ray read by radiology as no acute disease and myself think there is some increased vascular markings present I will check a BNP to see if this is true.  Official radiology report(s): CT ABDOMEN PELVIS W CONTRAST  Result Date: 02/17/2021 CLINICAL DATA:  Right-sided low back pain with hypotension and elevated lactate. Evaluate for cause of sepsis including renal obstruction or abscess. Fall this morning. EXAM: CT ABDOMEN AND PELVIS WITH CONTRAST TECHNIQUE: Multidetector CT imaging of the abdomen and pelvis was performed using the standard protocol following bolus administration of intravenous contrast.  CONTRAST:  117mL OMNIPAQUE IOHEXOL 300 MG/ML  SOLN COMPARISON:  05/22/2020 FINDINGS: Lower chest: No acute findings in the lung bases. Mild stable cardiomegaly. Hepatobiliary: Liver, gallbladder and biliary tree are within normal. Pancreas: Normal. Spleen: Normal. Adrenals/Urinary Tract: Adrenal glands are normal. Kidneys are normal in size and demonstrate bilateral cysts without significant change. No hydronephrosis. A few stones over the mid to lower pole left kidney with the largest measuring 5 mm. Ureters and bladder are normal. Stomach/Bowel: Stomach and small bowel are normal. Previous appendectomy. Minimal diverticulosis of the colon. Vascular/Lymphatic: Mild calcified plaque over the abdominal aorta which is normal in caliber. No adenopathy. Reproductive: Fiducial markers over the prostate which is unchanged. Other: No free fluid or focal inflammatory changes. Musculoskeletal: Degenerative changes of the spine. IMPRESSION: 1. No acute findings in the abdomen/pelvis. 2. Bilateral renal cysts without significant change. Left-sided nephrolithiasis without obstruction. 3. Minimal colonic diverticulosis. 4. Aortic atherosclerosis. Aortic Atherosclerosis (ICD10-I70.0). Electronically Signed   By: Marin Olp M.D.   On: 02/17/2021 11:28   DG Chest Portable 1 View  Result Date: 02/17/2021 CLINICAL DATA:  Fall this morning with generalized weakness.  Cough. EXAM: PORTABLE CHEST 1 VIEW COMPARISON:  None. FINDINGS: Lungs are adequately inflated without focal airspace consolidation or effusion. No pneumothorax. Cardiomediastinal silhouette is within normal. No acute fracture. Degenerative change of the spine. IMPRESSION: No acute findings. Electronically Signed   By: Marin Olp M.D.   On: 02/17/2021 08:19    ____________________________________________   PROCEDURES  Procedure(s) performed (including Critical Care): Critical care time 1 hour.  This includes visiting the patient multiple times and  evaluating his lab several times as they come back.  I also reviewed his old records and spoke with the hospitalist.  Procedures   ____________________________________________   INITIAL IMPRESSION / ASSESSMENT AND PLAN / ED COURSE ----------------------------------------- 8:40 AM on 02/17/2021 ----------------------------------------- Patient's lactic acid comes back over 3.  His blood pressure is in the 90s.  I will start a second liter this time with normal saline to run concurrently.  He is not running a fever his white count is not elevated.  He does not seem to have a source.  I am not sure what is going on.  I will wait just a little bit  to see if something declares itself if not I will CT his belly to see if the low back pain he has had for so long as associated with this problem.  Possibly there is some abscess or something. ----------------------------------------- 12:12 PM on 02/17/2021 ----------------------------------------- Patient's labs have completely come back now.  Lactic acid is improving.  Blood pressure is now above 100 x 8 points.  Patient is not febrile still.  He still has not made urine.  CT of the abdomen does not show any cause for his problems or pain.  We will have to get him into the hospital to address his multiple problems.            ____________________________________________   FINAL CLINICAL IMPRESSION(S) / ED DIAGNOSES  Final diagnoses:  Fall, initial encounter  Weak  Hypotensive episode  COVID     ED Discharge Orders     None        Note:  This document was prepared using Dragon voice recognition software and may include unintentional dictation errors.    Nena Polio, MD 02/17/21 1213    Nena Polio, MD 02/17/21 1450

## 2021-02-17 NOTE — ED Notes (Signed)
Pt refusing in and out cath at this time

## 2021-02-17 NOTE — ED Triage Notes (Addendum)
Pt coming from via [facility]. Per EMS, pt had a fall around 0530 this morning due to generalized weakness pt has been feeling for the past 24 hours. Pt has not had a recent flu or covid test.   Pt is A&Ox4. Pt has purposeful movement in both upper and lower extremities. Hand grips are strong.

## 2021-02-17 NOTE — ED Notes (Signed)
Patient transported to CT 

## 2021-02-17 NOTE — H&P (Signed)
History and Physical    AYAAN SHUTES WCH:[id number] DOB: Sep 07, 1945 DOA: 02/17/2021  PCP: Rusty Aus, MD   Patient coming from: Home  I have personally briefly reviewed patient's old medical records in [place]  Chief Complaint:   HPI: Nicholas [name] is a 75 y.o. male with medical history significant for atrial fibrillation, history of pulmonary embolism, TIA, gout who presents to the ER for evaluation of weakness status post fall. Patient states that he was in his usual state of health until this morning when he woke up and felt very weak.  He fell while walking to the bathroom and was unable to get up and so EMS was called. He complains of nonproductive cough but denies having any shortness of breath, no chest pain, no headache, no myalgias, no changes in his appetite, no nausea, no vomiting, no diarrhea, no palpitations or diaphoresis.  He has no lower extremity swelling, no orthopnea, no PND, no focal deficit or blurred vision. Sodium 133, potassium 3.7, chloride 104, bicarb 19, glucose 88, BUN 13, creatinine 1.07, calcium 8.7, alkaline phosphatase 48, albumin 3.7, AST 40, ALT 19, total protein 5.9, BNP 382, troponin 25 >> 31, lactic acid 3.4 >> 2.0, white count 7.4, hemoglobin 13.9, hematocrit 41.7, MCV 96.3, RDW 12.4, platelet count 202 Patient's SARS coronavirus 2 PCR test is positive Chest x-ray reviewed by me shows no acute findings CT scan of abdomen and pelvis shows no acute findings.  Bilateral renal cysts without significant change.  Left-sided nephrolithiasis without obstruction. Twelve-lead EKG reviewed by me shows atrial fibrillation with diffuse T wave changes.  ED Course: Patient is a 75 year old male who presents to the ER for evaluation of weakness status post fall. He denies having any constitutional symptoms. Per ER physician patient had systolic blood pressure in the 80s and received 3 L IV fluid bolus with improvement in his blood pressure. His SARS  coronavirus 2 PCR test is positive He will be admitted to the hospital for further evaluation.  Review of Systems: As per HPI otherwise all other systems reviewed and negative.    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 He has never used smokeless tobacco. He reports that he does not drink alcohol and does not use drugs.  Allergies  Allergen Reactions   Penicillins Other (See Comments)   Allopurinol Hives    Family History  Problem Relation Age of Onset   Hypertension Mother       Prior to Admission medications   Medication Sig Start Date End Date Taking? Authorizing Provider  aspirin EC 81 MG tablet Take 81 mg by mouth daily. Swallow whole.    [provider]  atenolol (TENORMIN) 25 MG tablet Take 12.5 mg by mouth 2 (two) times daily. 07/16/19   [provider]  colchicine 0.6 MG tablet Take 0.6 mg by mouth as needed.  [provider]  febuxostat (ULORIC) 40 MG tablet Take 40 mg by mouth daily. 06/20/19   [provider]  hydrocortisone 2.5 % cream Apply topically daily. Tuesday, Thursday, Saturday 09/05/20   Ralene Bathe, MD  ketoconazole (NIZORAL) 2 % cream Apply 1 application topically daily. Monday, Wednesday, Friday 09/05/20 08/26/22  Ralene Bathe, MD  XARELTO 20 MG TABS tablet Take 20 mg by mouth daily. 06/14/19   [provider]    Physical Exam: Vitals:   02/17/21 1000 02/17/21 1036 02/17/21  1130 02/17/21 1200  BP: 106/60 110/65 108/69 105/65  Pulse: 88 93 82 78  Resp: (!) 27 (!) 22 (!) 24 (!) 24  Temp:      TempSrc:      SpO2: 95% 96% 97% 96%  Weight:      Height:         Vitals:   02/17/21 1000 02/17/21 1036 02/17/21 1130 02/17/21 1200  BP: 106/60 110/65 108/69 105/65  Pulse: 88 93 82 78  Resp: (!) 27 (!) 22 (!) 24 (!) 24  Temp:      TempSrc:      SpO2: 95% 96% 97% 96%  Weight:      Height:          Constitutional: Alert and oriented x 3 . Not in any apparent distress HEENT:      Head: Normocephalic and atraumatic.         Eyes: PERLA, EOMI, Conjunctivae are normal. Sclera is non-icteric.       Mouth/Throat: Mucous membranes are moist.       Neck: Supple with no signs of meningismus. Cardiovascular: Regular rate and rhythm. No murmurs, gallops, or rubs. 2+ symmetrical distal pulses are present . No JVD. No LE edema Respiratory: Respiratory effort normal .Lungs sounds clear bilaterally. No wheezes, crackles, or rhonchi.  Gastrointestinal: Soft, non tender, and non distended with positive bowel sounds.  Genitourinary: No CVA tenderness. Musculoskeletal: Nontender with normal range of motion in all extremities. No cyanosis, or erythema of extremities. Neurologic:  Face is symmetric. Moving all extremities. No gross focal neurologic deficits .  Generalized weakness Skin: Skin is warm, dry.  No rash or ulcers Psychiatric: Mood and affect are normal    Labs on Admission: I have personally reviewed following labs and imaging studies  CBC: Recent Labs  Lab 02/17/21 0755  WBC 7.4  NEUTROABS 5.8  HGB 13.9  HCT 41.7  MCV 96.3  PLT 283   Basic Metabolic Panel: Recent Labs  Lab 02/17/21 0755  NA 133*  K 3.7  CL 104  CO2 19*  GLUCOSE 88  BUN 13  CREATININE 1.07  CALCIUM 8.7*   GFR: Estimated Creatinine Clearance: 81.4 mL/min (by C-G formula based on SCr of 1.07 mg/dL). Liver Function Tests: Recent Labs  Lab 02/17/21 0755  AST 40  ALT 19   ALKPHOS 48  BILITOT 0.8  PROT 5.9*  ALBUMIN 3.7   No results for input(s): LIPASE, AMYLASE in the last 168 hours. No results for input(s): AMMONIA in the last 168 hours. Coagulation Profile: No results for input(s): INR, PROTIME in the last 168 hours. Cardiac Enzymes: No results for input(s): CKTOTAL, CKMB, CKMBINDEX, TROPONINI in the last 168 hours. BNP (last 3 results) No results for input(s): PROBNP in the last 8760 hours. HbA1C: No results for input(s): HGBA1C in the last 72 hours. CBG: No results for input(s): GLUCAP in the last 168 hours. Lipid Profile: No results for input(s): CHOL, HDL, LDLCALC,  TRIG, CHOLHDL, LDLDIRECT in the last 72 hours. Thyroid Function Tests: No results for input(s): TSH, T4TOTAL, FREET4, T3FREE, THYROIDAB in the last 72 hours. Anemia Panel: No results for input(s): VITAMINB12, FOLATE, FERRITIN, TIBC, IRON, RETICCTPCT in the last 72 hours. Urine analysis:    Component Value Date/Time   APPEARANCEUR Hazy (A) 04/26/2020 1509   GLUCOSEU Negative 04/26/2020 1509   BILIRUBINUR Negative 04/26/2020 1509   PROTEINUR Trace (A) 04/26/2020 1509   NITRITE Negative 04/26/2020 1509   LEUKOCYTESUR Negative 04/26/2020 1509    Radiological Exams on Admission: CT ABDOMEN PELVIS W CONTRAST  Result Date: 02/17/2021 CLINICAL DATA:  Right-sided low back pain with hypotension and elevated lactate. Evaluate for cause of sepsis including renal obstruction or abscess. Fall this morning. EXAM: CT ABDOMEN AND PELVIS WITH CONTRAST TECHNIQUE: Multidetector CT imaging of the abdomen and pelvis was performed using the standard protocol following bolus administration of intravenous contrast. CONTRAST:  121mL OMNIPAQUE IOHEXOL 300 MG/ML  SOLN COMPARISON:  05/22/2020 FINDINGS: Lower chest: No acute findings in the lung bases. Mild stable cardiomegaly. Hepatobiliary: Liver, gallbladder and biliary tree are within normal. Pancreas: Normal. Spleen: Normal. Adrenals/Urinary Tract:  Adrenal glands are normal. Kidneys are normal in size and demonstrate bilateral cysts without significant change. No hydronephrosis. A few stones over the mid to lower pole left kidney with the largest measuring 5 mm. Ureters and bladder are normal. Stomach/Bowel: Stomach and small bowel are normal. Previous appendectomy. Minimal diverticulosis of the colon. Vascular/Lymphatic: Mild calcified plaque over the abdominal aorta which is normal in caliber. No adenopathy. Reproductive: Fiducial markers over the prostate which is unchanged. Other: No free fluid or focal inflammatory changes. Musculoskeletal: Degenerative changes of the spine. IMPRESSION: 1. No acute findings in the abdomen/pelvis. 2. Bilateral renal cysts without significant change. Left-sided nephrolithiasis without obstruction. 3. Minimal colonic diverticulosis. 4. Aortic atherosclerosis. Aortic Atherosclerosis (ICD10-I70.0). Electronically Signed   By: Marin Olp M.D.   On: 02/17/2021 11:28   DG Chest Portable 1 View  Result Date: 02/17/2021 CLINICAL DATA:  Fall this morning with generalized weakness.  Cough. EXAM: PORTABLE CHEST 1 VIEW COMPARISON:  Nicholas [name]. FINDINGS: Lungs are adequately inflated without focal airspace consolidation or effusion. No pneumothorax. Cardiomediastinal silhouette is within normal. No acute fracture. Degenerative change of the spine. IMPRESSION: No acute findings. Electronically Signed   By: Marin Olp M.D.   On: 02/17/2021 08:19     Assessment/Plan Principal Problem:   COVID-19 virus infection Active Problems:   A-fib (HCC)   TIA (transient ischemic attack)   Lactic acidosis   Hyponatremia   Hypotension   Generalized weakness     Patient is a 75 year old male who presents to the ER for evaluation of generalized weakness status post fall.  His SARS coronavirus 2 PCR test is positive.   COVID-19 viral infection Patient presents for evaluation of generalized weakness status post fall His SARS  coronavirus 2 PCR test is positive Patient is vaccinated against the COVID-19 virus but did not receive the booster shot He declines IV remdesivir at this time Supportive care He is not hypoxic and does not require oxygen supplementation.    Sepsis Unclear source of sepsis at this time As evidenced by hypotension with response to IV fluid resuscitation, tachypnea, lactic acidosis which shows a downward trend. Patient does not have any obvious source of sepsis at this time. Leukocytosis is normal UA is pending Follow-up results of UA     Permanent atrial fibrillation Hold atenolol due to hypotension Continue Xarelto as primary  prophylaxis for an acute stroke     Generalized weakness Status post fall Could be secondary to underlying COVID-19 viral infection Place patient on fall precautions PT eval   Lactic acidosis May be secondary to hypotension with no evidence of an infectious process at this time Lactic acid level shows a downward trend   DVT prophylaxis: Xarelto Code Status: full code  Family Communication: Greater than 50% of time was spent discussing patient's condition and plan of care with him at the bedside.  Discussed with patient that his SARS coronavirus 2 PCR test is positive and recommended antiviral therapy which he declines at this time.  All questions and concerns have been addressed.  He verbalizes understanding and agrees with the plan. Disposition Plan: Back to previous home environment Consults called: Physical therapy Status:At the time of admission, it appears that the appropriate admission status for this patient is inpatient. This is judged to be reasonable and necessary to provide the required intensity of service to ensure the patient's safety given the presenting symptoms, physical exam findings, and initial radiographic and laboratory data in the context of their comorbid conditions. Patient requires inpatient status due to high intensity of  service, high risk for further deterioration and high frequency of surveillance required.     Collier Bullock MD [facility]     02/17/2021, 1:05 PM

## 2021-02-17 NOTE — ED Notes (Signed)
Bladder scan pt, 10mL showed on bladder scanner.

## 2021-02-18 ENCOUNTER — Ambulatory Visit: Payer: Medicare Other

## 2021-02-18 LAB — BASIC METABOLIC PANEL
Anion gap: 6 (ref 5–15)
BUN: 17 mg/dL (ref 8–23)
CO2: 21 mmol/L — ABNORMAL LOW (ref 22–32)
Calcium: 8.4 mg/dL — ABNORMAL LOW (ref 8.9–10.3)
Chloride: 103 mmol/L (ref 98–111)
Creatinine, Ser: 1.07 mg/dL (ref 0.61–1.24)
GFR, Estimated: >60 mL/min (ref >60)
Glucose, Bld: 99 mg/dL (ref 70–99)
Potassium: 3.9 mmol/L (ref 3.5–5.1)
Sodium: 130 mmol/L — ABNORMAL LOW (ref 135–145)

## 2021-02-18 LAB — CBC
HCT: 39.2 % (ref 39.0–52.0)
Hemoglobin: 13.3 g/dL (ref 13.0–17.0)
MCH: 32.5 pg (ref 26.0–34.0)
MCHC: 33.9 g/dL (ref 30.0–36.0)
MCV: 95.8 fL (ref 80.0–100.0)
Platelets: 156 10*3/uL (ref 150–400)
RBC: 4.09 MIL/uL — ABNORMAL LOW (ref 4.22–5.81)
RDW: 12.8 % (ref 11.5–15.5)
WBC: 5.5 10*3/uL (ref 4.0–10.5)
nRBC: 0 % (ref 0.0–0.2)

## 2021-02-18 LAB — VITAMIN D 25 HYDROXY (VIT D DEFICIENCY, FRACTURES): Vit D, 25-Hydroxy: 25.28 ng/mL — ABNORMAL LOW (ref 30–100)

## 2021-02-18 LAB — PHOSPHORUS: Phosphorus: 3.5 mg/dL (ref 2.5–4.6)

## 2021-02-18 LAB — MAGNESIUM: Magnesium: 1.9 mg/dL (ref 1.7–2.4)

## 2021-02-18 LAB — VITAMIN B12: Vitamin B-12: 447 pg/mL (ref 180–914)

## 2021-02-18 LAB — OSMOLALITY: Osmolality: 280 mOsm/kg (ref 275–295)

## 2021-02-18 LAB — CK: Total CK: 981 U/L — ABNORMAL HIGH (ref 49–397)

## 2021-02-18 MED ORDER — HYDROCODONE BIT-HOMATROP MBR 5-1.5 MG/5ML PO SOLN
5.0000 mL | Freq: Four times a day (QID) | ORAL | Status: DC | PRN
  Administered 2021-02-18: 5 mL via ORAL
  Filled 2021-02-18: qty 5

## 2021-02-18 MED ORDER — GUAIFENESIN ER 600 MG PO TB12
600.0000 mg | ORAL_TABLET | Freq: Two times a day (BID) | ORAL | Status: DC
  Administered 2021-02-18 – 2021-02-23 (×10): 600 mg via ORAL
  Filled 2021-02-18 (×10): qty 1

## 2021-02-18 MED ORDER — ALBUTEROL SULFATE (2.5 MG/3ML) 0.083% IN NEBU: 2.5000 mg | INHALATION_SOLUTION | RESPIRATORY_TRACT | Status: DC | PRN

## 2021-02-18 MED ORDER — SODIUM CHLORIDE 0.9 % IV SOLN
100.0000 mg | Freq: Every day | INTRAVENOUS | Status: AC
  Administered 2021-02-19 – 2021-02-22 (×4): 100 mg via INTRAVENOUS
  Filled 2021-02-18 (×4): qty 100

## 2021-02-18 MED ORDER — LORATADINE 10 MG PO TABS
10.0000 mg | ORAL_TABLET | Freq: Every day | ORAL | Status: DC
  Administered 2021-02-19 – 2021-02-23 (×5): 10 mg via ORAL
  Filled 2021-02-18 (×5): qty 1

## 2021-02-18 MED ORDER — SODIUM CHLORIDE 0.9 % IV SOLN
200.0000 mg | Freq: Once | INTRAVENOUS | Status: AC
  Administered 2021-02-18: 200 mg via INTRAVENOUS
  Filled 2021-02-18: qty 200

## 2021-02-18 MED ORDER — SODIUM CHLORIDE 0.9 % IV SOLN: INTRAVENOUS | Status: DC

## 2021-02-18 NOTE — Evaluation (Signed)
Occupational Therapy Evaluation Patient Details Name: Nicholas [name] MRN: [id number] DOB: [birth year] Today's Date: 02/18/2021   History of Present Illness Patient is a 75 y.o. male with medical history significant for atrial fibrillation, history of pulmonary embolism, TIA, gout who presents to the ER for evaluation of weakness status post fall. Found to be COVID positive.   Clinical Impression   Nicholas [name] was seen for OT evaluation this date. Prior to hospital admission, pt was independent. Pt lives with spouse.  Currently pt demonstrates impairments as described below (See OT problem list) which functionally limit his ability to perform ADL/self-care tasks. Pt MOD A x2 for fucntional mobility, STS at EOB. Pt MOD A + 2 for lateral steps along EOB. Highest HR observed 140 upon return to bed. Pt on RA throughout tx, O2 maintained in 90s. Pt would benefit from skilled OT services to address noted impairments and functional limitations (see below for any additional details) in order to maximize safety and independence while minimizing falls risk and caregiver burden. Upon hospital discharge, recommend STR to maximize pt safety and return to PLOF.       Recommendations for follow up therapy are one component of a multi-disciplinary discharge planning process, led by the attending physician.  Recommendations may be updated based on patient status, additional functional criteria and insurance authorization.   Follow Up Recommendations  Skilled nursing-short term rehab (<3 hours/day)    Assistance Recommended at Discharge Intermittent Supervision/Assistance  Functional Status Assessment  Patient has had a recent decline in their functional status and demonstrates the ability to make significant improvements in function in a reasonable and predictable amount of time.  Equipment Recommendations  BSC/3in1;Tub/shower bench    Recommendations for Other Services       Precautions / Restrictions  Precautions Precautions: Fall Restrictions Weight Bearing Restrictions: No      Mobility Bed Mobility Overal bed mobility: Needs Assistance Bed Mobility: Supine to Sit;Sit to Supine     Supine to sit: Min assist;HOB elevated Sit to supine: Min assist        Transfers Overall transfer level: Needs assistance Equipment used: None Transfers: Sit to/from Stand Sit to Stand: Max assist;From elevated surface;+2 physical assistance                  Balance Overall balance assessment: Needs assistance;History of Falls Sitting-balance support: Feet supported;No upper extremity supported Sitting balance-Leahy Scale: Fair     Standing balance support: Bilateral upper extremity supported Standing balance-Leahy Scale: Poor                             ADL either performed or assessed with clinical judgement   ADL Overall ADL's : Needs assistance/impaired                                       General ADL Comments: Pt MOD A x2 for fucntional mobility, STS at EOB.      Pertinent Vitals/Pain Pain Assessment: Faces Faces Pain Scale: Hurts even more Pain Location: sharp pain in throat Pain Descriptors / Indicators: Discomfort;Stabbing Pain Intervention(s): Monitored during session     Hand Dominance     Extremity/Trunk Assessment Upper Extremity Assessment Upper Extremity Assessment: Generalized weakness   Lower Extremity Assessment Lower Extremity Assessment: Generalized weakness       Communication Communication Communication: No difficulties   Cognition  Arousal/Alertness: Awake/alert Behavior During Therapy: WFL for tasks assessed/performed Overall Cognitive Status: No family/caregiver present to determine baseline cognitive functioning                                 General Comments: Pt was disoriented to time     General Comments  Highest HR observed 140 upon return to bed. Pt on RA throughout tx, O2  maintained in 90s.    Exercises Exercises: Other exercises Other Exercises Other Exercises: Pt educ re: OT role, falls prevention, precautions Other Exercises: Sup<>sit, sit<>stand, marching in place, 4 lateral steps along EOB   Shoulder Instructions      Home Living Family/patient expects to be discharged to:: Private residence Living Arrangements: Spouse/significant other     Home Access: Stairs to enter Technical brewer of Steps: 4 Entrance Stairs-Rails: Right;Left Home Layout: One level     Bathroom Shower/Tub: Occupational psychologist: Handicapped height                Prior Functioning/Environment Prior Level of Function : History of Falls (last six months);Independent/Modified Independent             Mobility Comments: independent with mobility around the house. he uses a stick while walking in the neighborhood ADLs Comments: independent        OT Problem List: Decreased strength;Decreased range of motion;Decreased activity tolerance;Impaired balance (sitting and/or standing);Decreased coordination;Decreased safety awareness;Decreased knowledge of use of DME or AE;Decreased knowledge of precautions      OT Treatment/Interventions: Self-care/ADL training;Therapeutic exercise;Energy conservation;DME and/or AE instruction;Therapeutic activities    OT Goals(Current goals can be found in the care plan section) Acute Rehab OT Goals Patient Stated Goal: to get better OT Goal Formulation: With patient Time For Goal Achievement: 03/04/21 Potential to Achieve Goals: Good ADL Goals Pt Will Perform Grooming: standing;with min assist Pt Will Perform Lower Body Dressing: sit to/from stand;with min assist Pt Will Transfer to Toilet: regular height toilet;ambulating;with min assist  OT Frequency: Min 2X/week   Barriers to D/C:            Co-evaluation              AM-PAC OT "6 Clicks" Daily Activity     Outcome Measure Help from another  person eating meals?: None Help from another person taking care of personal grooming?: A Little Help from another person toileting, which includes using toliet, bedpan, or urinal?: A Lot Help from another person bathing (including washing, rinsing, drying)?: A Lot Help from another person to put on and taking off regular upper body clothing?: A Little Help from another person to put on and taking off regular lower body clothing?: A Lot 6 Click Score: 16   End of Session Nurse Communication: Other (comment) (Pt did well on RA throughout tx, nurse notified OT to leave pt on RA)  Activity Tolerance: Patient tolerated treatment well Patient left: in bed;with call bell/phone within reach;with bed alarm set  OT Visit Diagnosis: Unsteadiness on feet (R26.81);Other abnormalities of gait and mobility (R26.89);Muscle weakness (generalized) (M62.81)                Time: 3818-2993 OT Time Calculation (min): 32 min Charges:  OT General Charges $OT Visit: 1 Visit OT Evaluation $OT Eval Moderate Complexity: 1 Mod OT Treatments $Therapeutic Activity: 23-37 mins Nino Glow, Markus Daft 02/18/2021, 4:28 PM

## 2021-02-18 NOTE — Consult Note (Signed)
Remdesivir - Pharmacy Brief Note   O:  ALT: 19 CXR: "No acute findings" SpO2: >90% on 2L [place]   A/P:  02/17/21 SARS-CoV-2 PCR (+)  Remdesivir 200 mg IVPB once followed by 100 mg IVPB daily x 4 days.   Benita Gutter  02/18/2021 3:13 PM

## 2021-02-18 NOTE — ED Notes (Signed)
Patient is resting comfortably. 

## 2021-02-18 NOTE — ED Notes (Signed)
Pt given breakfast tray

## 2021-02-18 NOTE — Progress Notes (Signed)
[facility] Progress Note  Patient: Nicholas [name]    ZOX:[id number]  DOA: 02/17/2021     Date of Service: the patient was seen and examined on 02/18/2021  Chief Complaint  Patient presents with   Fall   Brief hospital course: Nicholas [name] is a 75 y.o. male with medical history significant for atrial fibrillation, history of pulmonary embolism, TIA, gout who presents to the ER for evaluation of weakness status post fall. Patient states that he was in his usual state of health until this morning when he woke up and felt very weak.  He fell while walking to the bathroom and was unable to get up and so EMS was called. He complains of nonproductive cough but denies having any shortness of breath, no chest pain, no headache, no myalgias, no changes in his appetite, no nausea, no vomiting, no diarrhea, no palpitations or diaphoresis.  He has no lower extremity swelling, no orthopnea, no PND, no focal deficit or blurred vision. Sodium 133, potassium 3.7, chloride 104, bicarb 19, glucose 88, BUN 13, creatinine 1.07, calcium 8.7, alkaline phosphatase 48, albumin 3.7, AST 40, ALT 19, total protein 5.9, BNP 382, troponin 25 >> 31, lactic acid 3.4 >> 2.0, white count 7.4, hemoglobin 13.9, hematocrit 41.7, MCV 96.3, RDW 12.4, platelet count 202 Patient's SARS coronavirus 2 PCR test is positive Chest x-ray reviewed by me shows no acute findings CT scan of abdomen and pelvis shows no acute findings.  Bilateral renal cysts without significant change.  Left-sided nephrolithiasis without obstruction. Twelve-lead EKG reviewed by me shows atrial fibrillation with diffuse T wave changes.   ED Course: Patient is a 75 year old male who presents to the ER for evaluation of weakness status post fall. He denies having any constitutional symptoms. Per ER physician patient had systolic blood pressure in the 80s and received 3 L IV fluid bolus with improvement in his blood pressure. His SARS coronavirus 2 PCR  test is positive He will be admitted to the hospital for further evaluation.  Patient is a 75 year old male who presents to the ER for evaluation of generalized weakness status post fall.  His SARS coronavirus 2 PCR test is positive, elevated CK level rhabdomyolysis, acute hypoxic respiratory failure requiring 2 L oxygen via nasal cannula.  Admitted for further management as below.  Assessment and Plan: Principal Problem:   COVID-19 virus infection Active Problems:   A-fib (HCC)   TIA (transient ischemic attack)   Lactic acidosis   Hyponatremia   Hypotension   Generalized weakness       Acute hypoxic respiratory failure due to COVID-19 viral infection Patient presents for evaluation of generalized weakness status post fall His SARS coronavirus 2 PCR test is positive Patient is vaccinated against the COVID-19 virus but did not receive the booster shot Started remdesivir, pharmacy consulted for dosing Continue albuterol as needed, Mucinex twice daily, Hycodan for cough and Zyrtec for symptomatic treatment Continue supplemental O2 initially and gradually wean off. O2 saturation dropped to 91% on room air during my exam, patient was placed on 2 L oxygen   Rhabdomyolysis, patient presented with lower extremity weakness and fall CK 981 elevated, continue to trend Started IV fluid NS 100 mill per hour for overnight hydration, reassess hydration status tomorrow a.m.   Sepsis could be secondary to COVID viral infection As evidenced by hypotension with response to IV fluid resuscitation, tachypnea, lactic acidosis  Lactic acid improving, continue to trend Continue IV fluid for hydration as above WBC count within  normal range, UA negative, CXR does not show any infiltrates, negative CT abdomen negative for any infection     Permanent atrial fibrillation Hold atenolol due to hypotension Continue Xarelto as primary prophylaxis for an acute stroke    Generalized weakness, Status post  fall, most likely due to rhabdomyolysis secondary to COVID viral infection. Continue fall precautions PT eval     Body mass index is 30.8 kg/m.   Interventions:       Diet: Heart healthy DVT Prophylaxis: Therapeutic Anticoagulation with Xarelto    Advance goals of care discussion: Full code  Family Communication: family was not present at bedside, at the time of interview.  The pt provided permission to discuss medical plan with the family. Opportunity was given to ask question and all questions were answered satisfactorily.   Disposition:  Pt is from Home, admitted with weakness and fall, found to have COVID viral infection, acute hypoxic respiratory failure and rhabdomyolysis, still has weakness and respiratory failure, which precludes a safe discharge. Discharge to Home, when clinically improves, may require 2 to 3 days.  Subjective: No significant overnight events, patient presented with bilateral lower extremity weakness and fall.  COVID-positive, has mild cough with mild shortness of breath.  Denied any chest pain, no palpitations.  Denies any abdominal pain, no nausea vomiting or diarrhea. Patient agreed to start remdesivir as he is little bit hypoxic on exertion.  Physical Exam: General:  alert oriented to time, place, and person.  Appear in mild distress, affect appropriate Eyes: PERRLA ENT: Oral Mucosa Clear, moist  Neck: no JVD,  Cardiovascular: S1 and S2 Present, no Murmur,  Respiratory: increased respiratory effort, Bilateral Air entry equal and Decreased, mild Crackles, no wheezes Abdomen: Bowel Sound present, Soft and no tenderness,  Skin: no rashes Extremities: no Pedal edema, no calf tenderness Neurologic: without any new focal findings Gait not checked due to patient safety concerns  Vitals:   02/18/21 0830 02/18/21 0900 02/18/21 1130 02/18/21 1230  BP: 123/71 112/71 109/83 115/77  Pulse: 100 97 93 90  Resp: (!) 22 (!) 22 (!) 22 20  Temp:      TempSrc:       SpO2: 96% 94% 95% 94%  Weight:      Height:        Intake/Output Summary (Last 24 hours) at 02/18/2021 1512 Last data filed at 02/17/2021 2209 Gross per 24 hour  Intake 3 ml  Output 350 ml  Net -347 ml   Filed Weights   02/17/21 0748  Weight: 114.8 kg    Data Reviewed: I have personally reviewed and interpreted daily labs, tele strips, imagings as discussed above. I reviewed all nursing notes, pharmacy notes, vitals, pertinent old records I have discussed plan of care as described above with RN and patient/family.  CBC: Recent Labs  Lab 02/17/21 0755 02/18/21 0440  WBC 7.4 5.5  NEUTROABS 5.8  --   HGB 13.9 13.3  HCT 41.7 39.2  MCV 96.3 95.8  PLT 202 341   Basic Metabolic Panel: Recent Labs  Lab 02/17/21 0755 02/18/21 0440  NA 133* 130*  K 3.7 3.9  CL 104 103  CO2 19* 21*  GLUCOSE 88 99  BUN 13 17  CREATININE 1.07 1.07  CALCIUM 8.7* 8.4*  MG  --  1.9  PHOS  --  3.5    Studies: No results found.  Scheduled Meds:  aspirin EC  81 mg Oral Daily   febuxostat  40 mg Oral Daily  guaiFENesin  600 mg Oral BID   [START ON 02/19/2021] loratadine  10 mg Oral Daily   rivaroxaban  20 mg Oral Daily   sodium chloride flush  3 mL Intravenous Q12H   Continuous Infusions:  sodium chloride     sodium chloride     remdesivir 200 mg in sodium chloride 0.9% 250 mL IVPB     Followed by   Derrill Memo ON 02/19/2021] remdesivir 100 mg in NS 100 mL     PRN Meds: sodium chloride, acetaminophen **OR** acetaminophen, albuterol, colchicine, HYDROcodone bit-homatropine, ondansetron **OR** ondansetron (ZOFRAN) IV, sodium chloride flush  Time spent: 35 minutes  Author: Val Riles. MD [facility] 02/18/2021 3:12 PM  To reach On-call, see care teams to locate the attending and reach out to them via [email or web address]. If 7PM-7AM, please contact night-coverage If you still have difficulty reaching the attending provider, please page the [facility] (Director on Call) for [facility] on amion for assistance.

## 2021-02-18 NOTE — ED Notes (Signed)
Pt supplied with hygiene products at this time

## 2021-02-18 NOTE — ED Notes (Signed)
Pt given phone to call daughter 

## 2021-02-18 NOTE — Evaluation (Addendum)
Physical Therapy Evaluation Patient Details Name: JHAMAL PLUCINSKI MRN: [id number] DOB: Aug 25, 1945 Today's Date: 02/18/2021  History of Present Illness  Patient is a 75 y.o. male with medical history significant for atrial fibrillation, history of pulmonary embolism, TIA, gout who presents to the ER for evaluation of weakness status post fall. Found to be COVID positive.   Clinical Impression  Patient is agreeable to PT evaluation. He reports he is independent and ambulates without assistive device at baseline. Today, he required significant assistance to stand. Multiple unsuccessful standing bouts. Bed height was raised and maximal assistance provided to stand x 2 bouts. Knees buckled in standing, poor standing balance demonstrated, and limited standing tolerance. Unable to safely attempt ambulation at this time. Patient is not at his baseline level of functional mobility. Recommend to continued PT to maximize independence and facilitate return to prior level of function. Consider SNF placement at this time.      Recommendations for follow up therapy are one component of a multi-disciplinary discharge planning process, led by the attending physician.  Recommendations may be updated based on patient status, additional functional criteria and insurance authorization.  Follow Up Recommendations Skilled nursing-short term rehab (<3 hours/day)    Assistance Recommended at Discharge  Frequent or constant supervision   Functional Status Assessment Patient has had a recent decline in their functional status and demonstrates the ability to make significant improvements in function in a reasonable and predictable amount of time.  Equipment Recommendations  Rolling walker (2 wheels)    Recommendations for Other Services       Precautions / Restrictions Precautions Precautions: Fall Restrictions Weight Bearing Restrictions: No      Mobility  Bed Mobility Overal bed mobility: Needs  Assistance Bed Mobility: Supine to Sit;Sit to Supine     Supine to sit: Min assist;HOB elevated Sit to supine: Mod assist;HOB elevated   General bed mobility comments: assistance for trunk support to sit up and assistance for BLE support to return to bed. verbal cues for sequencing and task initiation    Transfers Overall transfer level: Needs assistance Equipment used: None Transfers: Sit to/from Stand Sit to Stand: Max assist;From elevated surface           General transfer comment: patient was unable to stand with bed in the lowest height. with the bed height elevated, patient was able to stand 2 times sucessfully with maximal assistance, cues for technique. patient tried multiple times to stand unsucessfully. fatigued with minimal activity. patient was able to scoot to the left side x 3 bouts with only Min guard assistance.    Ambulation/Gait               General Gait Details: unsafe to attempt at this time due to bilateral knee buckling with standing, standing tolerance of less than 30 seconds each stand. patient with decreased awareness of deficits and requested to attempt ambulation to toilet which is not safe to attempt at this time  Stairs            Wheelchair Mobility    Modified Rankin (Stroke Patients Only)       Balance Overall balance assessment: Needs assistance;History of Falls Sitting-balance support: Feet supported;No upper extremity supported Sitting balance-Leahy Scale: Fair     Standing balance support: Single extremity supported Standing balance-Leahy Scale: Poor Standing balance comment: Moderate assistance required to maintain  standing balance  Pertinent Vitals/Pain Pain Assessment: Faces Faces Pain Scale: Hurts a little bit Pain Location: pain deep in he chest with coughing Pain Descriptors / Indicators: Discomfort Pain Intervention(s): Monitored during session    Home Living  Family/patient expects to be discharged to:: Private residence Living Arrangements: Spouse/significant other     Home Access: Stairs to enter Entrance Stairs-Rails: Psychiatric nurse of Steps: 4   Home Layout: One level        Prior Function Prior Level of Function : History of Falls (last six months);Independent/Modified Independent             Mobility Comments: independent with mobility around the house. he uses a stick while walking in the neighborhood ADLs Comments: independent     Hand Dominance        Extremity/Trunk Assessment   Upper Extremity Assessment Upper Extremity Assessment: Generalized weakness    Lower Extremity Assessment Lower Extremity Assessment: RLE deficits/detail;LLE deficits/detail RLE Deficits / Details: with isolated MMT (dorsiflexion/plantarflexion, knee extension) patient is 5/5. functional weakness present with knee buckling with weight bearing and decreased standing tolerance LLE Deficits / Details: with isolated MMT (dorsiflexion/plantarflexion, knee extension) patient is 5/5. functional weakness present with knee buckling with weight bearing and decreased standing tolerance       Communication   Communication: No difficulties  Cognition Arousal/Alertness: Awake/alert Behavior During Therapy: WFL for tasks assessed/performed Overall Cognitive Status: No family/caregiver present to determine baseline cognitive functioning                                 General Comments: patient is disoriented to time (he thinks it is 9:30 PM). he also asked more than once what day of the week is today. he is able to follow commands with increased time. slow processing overall and decreased awareness of physical limitations        General Comments      Exercises     Assessment/Plan    PT Assessment Patient needs continued PT services  PT Problem List Decreased strength;Decreased range of motion;Decreased activity  tolerance;Decreased balance;Decreased mobility;Decreased cognition;Decreased knowledge of use of DME;Decreased safety awareness       PT Treatment Interventions Gait training;DME instruction;Stair training;Functional mobility training;Therapeutic activities;Therapeutic exercise;Neuromuscular re-education;Balance training;Cognitive remediation;Patient/family education    PT Goals (Current goals can be found in the Care Plan section)  Acute Rehab PT Goals Patient Stated Goal: to be able to walk PT Goal Formulation: With patient Time For Goal Achievement: 03/04/21 Potential to Achieve Goals: Fair    Frequency Min 2X/week   Barriers to discharge        Co-evaluation               AM-PAC PT "6 Clicks" Mobility  Outcome Measure Help needed turning from your back to your side while in a flat bed without using bedrails?: A Lot Help needed moving from lying on your back to sitting on the side of a flat bed without using bedrails?: A Lot Help needed moving to and from a bed to a chair (including a wheelchair)?: A Lot Help needed standing up from a chair using your arms (e.g., wheelchair or bedside chair)?: A Lot Help needed to walk in hospital room?: Total Help needed climbing 3-5 steps with a railing? : Total 6 Click Score: 10    End of Session Equipment Utilized During Treatment: Gait belt Activity Tolerance: Patient tolerated treatment well Patient left: in bed;with call  bell/phone within reach;with bed alarm set Nurse Communication: Mobility status PT Visit Diagnosis: Unsteadiness on feet (R26.81);Muscle weakness (generalized) (M62.81)    Time: 2763-9432 PT Time Calculation (min) (ACUTE ONLY): 39 min   Charges:   PT Evaluation $PT Eval Moderate Complexity: 1 Mod PT Treatments $Therapeutic Activity: 23-37 mins       Minna Merritts, PT, MPT  Percell Locus 02/18/2021, 11:05 AM

## 2021-02-19 ENCOUNTER — Inpatient Hospital Stay: Admit: 2021-02-19 | Payer: Medicare Other

## 2021-02-19 LAB — CBC
HCT: 40.1 % (ref 39.0–52.0)
Hemoglobin: 13.6 g/dL (ref 13.0–17.0)
MCH: 31.9 pg (ref 26.0–34.0)
MCHC: 33.9 g/dL (ref 30.0–36.0)
MCV: 94.1 fL (ref 80.0–100.0)
Platelets: 153 10*3/uL (ref 150–400)
RBC: 4.26 MIL/uL (ref 4.22–5.81)
RDW: 12.6 % (ref 11.5–15.5)
WBC: 4.4 10*3/uL (ref 4.0–10.5)
nRBC: 0 % (ref 0.0–0.2)

## 2021-02-19 LAB — MAGNESIUM: Magnesium: 1.8 mg/dL (ref 1.7–2.4)

## 2021-02-19 LAB — PHOSPHORUS: Phosphorus: 3.6 mg/dL (ref 2.5–4.6)

## 2021-02-19 LAB — BASIC METABOLIC PANEL
Anion gap: 6 (ref 5–15)
BUN: 13 mg/dL (ref 8–23)
CO2: 20 mmol/L — ABNORMAL LOW (ref 22–32)
Calcium: 8.1 mg/dL — ABNORMAL LOW (ref 8.9–10.3)
Chloride: 107 mmol/L (ref 98–111)
Creatinine, Ser: 1 mg/dL (ref 0.61–1.24)
GFR, Estimated: >60 mL/min (ref >60)
Glucose, Bld: 113 mg/dL — ABNORMAL HIGH (ref 70–99)
Potassium: 3.4 mmol/L — ABNORMAL LOW (ref 3.5–5.1)
Sodium: 133 mmol/L — ABNORMAL LOW (ref 135–145)

## 2021-02-19 LAB — LACTIC ACID, PLASMA: Lactic Acid, Venous: 1.2 mmol/L (ref 0.5–1.9)

## 2021-02-19 LAB — CK: Total CK: 1385 U/L — ABNORMAL HIGH (ref 49–397)

## 2021-02-19 MED ORDER — LACTATED RINGERS IV SOLN: INTRAVENOUS | Status: DC

## 2021-02-19 MED ORDER — ATENOLOL 25 MG PO TABS
12.5000 mg | ORAL_TABLET | Freq: Two times a day (BID) | ORAL | Status: DC
  Administered 2021-02-19 – 2021-02-23 (×8): 12.5 mg via ORAL
  Filled 2021-02-19 (×9): qty 0.5

## 2021-02-19 MED ORDER — FUROSEMIDE 10 MG/ML IJ SOLN
40.0000 mg | Freq: Once | INTRAMUSCULAR | Status: AC
  Administered 2021-02-19: 12:00:00 40 mg via INTRAVENOUS
  Filled 2021-02-19: qty 4

## 2021-02-19 NOTE — NC FL2 (Signed)
[place] LEVEL OF CARE SCREENING TOOL     IDENTIFICATION  Patient Name: Nicholas [name] Birthdate: [birth year] Sex: male Admission Date (Current Location): 02/17/2021  [facility] and [place] Number:  Engineering geologist and Address:  [facility], [address] [place], [place] [address]      Provider Number: [id number]  Attending Physician Name and Address:  Nicholas Nimrod, MD  Relative Name and Phone Number:  [name], [name] (Spouse)   [phone] [facility])    Current Level of Care: Hospital Recommended Level of Care: [place] Prior Approval Number:    Date Approved/Denied:   PASRR Number: [id number]  Discharge Plan: SNF    Current Diagnoses: Patient Active Problem List   Diagnosis Date Noted   COVID-19 virus infection 02/17/2021   A-fib ([place])    TIA (transient ischemic attack)    Lactic acidosis    Hyponatremia    Hypotension    Generalized weakness     Orientation RESPIRATION BLADDER Height & Weight     Self, Time, Situation, Place  Normal Continent Weight: 114.8 kg Height:  6\' 4"  (193 cm)  BEHAVIORAL SYMPTOMS/MOOD NEUROLOGICAL BOWEL NUTRITION STATUS      Continent Diet (2 gram sodium)  AMBULATORY STATUS COMMUNICATION OF NEEDS Skin   Extensive Assist (Mod assist +2 sit to stand, did not ambulate) Verbally Normal                       Personal Care Assistance Level of Assistance  Bathing, Feeding, Dressing Bathing Assistance: Limited assistance (5/5 weakness of trunk Mod assist x 2) Feeding assistance: Limited assistance Dressing Assistance: Limited assistance (5/5 weakness Mod assist x 2)     Functional Limitations Info  Sight, Hearing, Speech Sight Info: Adequate Hearing Info: Adequate Speech Info: Adequate    SPECIAL CARE FACTORS FREQUENCY  PT (By licensed PT), OT (By licensed OT)     PT Frequency: Min 5x weeklly OT Frequency: Min 5x weekly            Contractures Contractures  Info: Not present    Additional Factors Info  Code Status, Allergies Code Status Info: FULL CODE Allergies Info: Penicillins Allopurinol           Current Medications (02/19/2021):  This is the current hospital active medication list Current Facility-Administered Medications  Medication Dose Route Frequency Provider Last Rate Last Admin   0.9 %  sodium chloride infusion  250 mL Intravenous PRN Agbata, Tochukwu, MD       acetaminophen (TYLENOL) tablet 650 mg  650 mg Oral Q6H PRN Agbata, Tochukwu, MD   650 mg at 02/18/21 1154   Or   acetaminophen (TYLENOL) suppository 650 mg  650 mg Rectal Q6H PRN Agbata, Tochukwu, MD       albuterol (PROVENTIL) (2.5 MG/3ML) 0.083% nebulizer solution 2.5 mg  2.5 mg Nebulization Q4H PRN Val Riles, MD       aspirin EC tablet 81 mg  81 mg Oral Daily Agbata, Tochukwu, MD   81 mg at 02/19/21 0947   colchicine tablet 0.6 mg  0.6 mg Oral Daily PRN Agbata, Tochukwu, MD       febuxostat (ULORIC) tablet 40 mg  40 mg Oral Daily Agbata, Tochukwu, MD   40 mg at 02/19/21 0947   guaiFENesin (MUCINEX) 12 hr tablet 600 mg  600 mg Oral BID Val Riles, MD   600 mg at 02/19/21 0947   HYDROcodone bit-homatropine (HYCODAN) 5-1.5 MG/5ML syrup 5 mL  5 mL Oral Q6H PRN Val Riles, MD   5 mL at 02/18/21 2030   lactated ringers infusion   Intravenous Continuous Nicholas Nimrod, MD       loratadine (CLARITIN) tablet 10 mg  10 mg Oral Daily Val Riles, MD   10 mg at 02/19/21 0947   ondansetron (ZOFRAN) tablet 4 mg  4 mg Oral Q6H PRN Agbata, Tochukwu, MD       Or   ondansetron (ZOFRAN) injection 4 mg  4 mg Intravenous Q6H PRN Agbata, Tochukwu, MD       remdesivir 100 mg in sodium chloride 0.9 % 100 mL IVPB  100 mg Intravenous Daily Val Riles, MD 200 mL/hr at 02/19/21 0945 100 mg at 02/19/21 0945   rivaroxaban (XARELTO) tablet 20 mg  20 mg Oral Daily Agbata, Tochukwu, MD   20 mg at 02/19/21 0948   sodium chloride flush (NS) 0.9 % injection 3 mL  3 mL Intravenous Q12H  Agbata, Tochukwu, MD   3 mL at 02/19/21 0948   sodium chloride flush (NS) 0.9 % injection 3 mL  3 mL Intravenous PRN Agbata, Tochukwu, MD         Discharge Medications: Please see discharge summary for a list of discharge medications.  Relevant Imaging Results:  Relevant Lab Results:   Additional Information SSN [id number]  Pete Pelt, RN

## 2021-02-19 NOTE — Progress Notes (Addendum)
PROGRESS NOTE    Nicholas [name]  UKG:[id number] DOB: 28-Oct-1945 DOA: 02/17/2021 PCP: Rusty Aus, MD   Brief Narrative: Taken from prior notes. Nicholas [name] is a 75 y.o. male with medical history significant for atrial fibrillation, history of pulmonary embolism, TIA, gout who presents to the ER for evaluation of weakness status post fall. Found to have a positive PCR for COVID-19. Imaging without any acute abnormality.  CT abdomen with bilateral renal cyst without any significant change.  Left-sided nephrolithiasis without obstruction. He was started on remdesivir. PT is recommending SNF-patient need to wait 10 days after positive COVID test.  Tested positive on 02/17/2021-should be able to go on 02/28/2021 Also found to have elevated CK level  Subjective: Patient was seen and examined today.  Still having some cough and little shortness of breath.  Assessment & Plan:   Principal Problem:   COVID-19 virus infection Active Problems:   A-fib ([place])   TIA (transient ischemic attack)   Lactic acidosis   Hyponatremia   Hypotension   Generalized weakness  COVID-19 viral infection.  Tested positive on 02/17/2021.  Received 2 doses of vaccine, no booster. Currently on room air.  Chest x-ray without any sign of pneumonia. Initially met sepsis criteria with tachypnea and lactic acidosis.  Lactic acidosis has been resolved. He was started on remdesivir-day 2 -Continue with supportive care and supplement.  Rhabdomyolysis.  CK currently trending up, most likely secondary to fall. -Continue with IV fluid -Monitor CK  Generalized weakness/falls.  PT is recommending SNF -Patient has to wait 10 days due to positive COVID PCR, should be able to go on 01/29/2021. -TOC to find placement  Permanent atrial fibrillation.  Home dose of atenolol was initially held due to hypotension. -Restart home atenolol -Continue with Xarelto  Lower extremity edema.  No prior diagnosis of heart  failure.  Mildly elevated BNP at 385. -Echocardiogram ordered-pending -Give him one-time dose of IV Lasix 40 mg -Continue to monitor  Objective: Vitals:   02/18/21 2355 02/19/21 0411 02/19/21 0736 02/19/21 1204  BP: 118/82 129/84 (!) 121/92 118/90  Pulse: 98 99 99 98  Resp:   18 18  Temp: 98.7 F (37.1 C) 98.5 F (36.9 C) 98.6 F (37 C) 97.9 F (36.6 C)  TempSrc: Oral Oral Oral Oral  SpO2: 94% 92% 93% 96%  Weight:      Height:        Intake/Output Summary (Last 24 hours) at 02/19/2021 1548 Last data filed at 02/19/2021 1411 Gross per 24 hour  Intake 1010.49 ml  Output 2050 ml  Net -1039.51 ml   Filed Weights   02/17/21 0748  Weight: 114.8 kg    Examination:  General exam: Appears calm and comfortable  Respiratory system: Clear to auscultation. Respiratory effort normal. Cardiovascular system: S1 & S2 heard, RRR.  Gastrointestinal system: Soft, nontender, nondistended, bowel sounds positive. Central nervous system: Alert and oriented. No focal neurological deficits. Extremities: 1+ LE edema, no cyanosis, pulses intact and symmetrical. Psychiatry: Judgement and insight appear normal. Mood & affect appropriate.    DVT prophylaxis: Xarelto Code Status: Full Family Communication:  Disposition Plan:  Status is: Inpatient  Remains inpatient appropriate because: Severity of illness   Level of care: Telemetry Cardiac  All the records are reviewed and case discussed with Care Management/Social Worker. Management plans discussed with the patient, nursing and they are in agreement.  Consultants:  None  Procedures:  Antimicrobials:   Data Reviewed: I have personally reviewed following labs  and imaging studies  CBC: Recent Labs  Lab 02/17/21 0755 02/18/21 0440 02/19/21 0608  WBC 7.4 5.5 4.4  NEUTROABS 5.8  --   --   HGB 13.9 13.3 13.6  HCT 41.7 39.2 40.1  MCV 96.3 95.8 94.1  PLT 202 156 665   Basic Metabolic Panel: Recent Labs  Lab 02/17/21 0755  02/18/21 0440 02/19/21 0608  NA 133* 130* 133*  K 3.7 3.9 3.4*  CL 104 103 107  CO2 19* 21* 20*  GLUCOSE 88 99 113*  BUN 13 17 13   CREATININE 1.07 1.07 1.00  CALCIUM 8.7* 8.4* 8.1*  MG  --  1.9 1.8  PHOS  --  3.5 3.6   GFR: Estimated Creatinine Clearance: 87.1 mL/min (by C-G formula based on SCr of 1 mg/dL). Liver Function Tests: Recent Labs  Lab 02/17/21 0755  AST 40  ALT 19  ALKPHOS 48  BILITOT 0.8  PROT 5.9*  ALBUMIN 3.7   No results for input(s): LIPASE, AMYLASE in the last 168 hours. No results for input(s): AMMONIA in the last 168 hours. Coagulation Profile: No results for input(s): INR, PROTIME in the last 168 hours. Cardiac Enzymes: Recent Labs  Lab 02/18/21 0440 02/19/21 0608  CKTOTAL 981* 1,385*   BNP (last 3 results) No results for input(s): PROBNP in the last 8760 hours. HbA1C: No results for input(s): HGBA1C in the last 72 hours. CBG: No results for input(s): GLUCAP in the last 168 hours. Lipid Profile: No results for input(s): CHOL, HDL, LDLCALC, TRIG, CHOLHDL, LDLDIRECT in the last 72 hours. Thyroid Function Tests: No results for input(s): TSH, T4TOTAL, FREET4, T3FREE, THYROIDAB in the last 72 hours. Anemia Panel: Recent Labs    02/18/21 1155  VITAMINB12 447   Sepsis Labs: Recent Labs  Lab 02/17/21 0755 02/17/21 0946 02/19/21 0608  PROCALCITON <0.10  --   --   LATICACIDVEN 3.4* 2.0* 1.2    Recent Results (from the past 240 hour(s))  Resp Panel by RT-PCR (Flu A&B, Covid) Nasopharyngeal Swab     Status: Abnormal   Collection Time: 02/17/21  7:55 AM   Specimen: Nasopharyngeal Swab; Nasopharyngeal(NP) swabs in vial transport medium  Result Value Ref Range Status   SARS Coronavirus 2 by RT PCR POSITIVE (A) NEGATIVE Final    Comment: RESULT CALLED TO, READ BACK BY AND VERIFIED WITH: ANTACIA BELL @0914  02/17/21 MJU (NOTE) SARS-CoV-2 target nucleic acids are DETECTED.  The SARS-CoV-2 RNA is generally detectable in upper  respiratory specimens during the acute phase of infection. Positive results are indicative of the presence of the identified virus, but do not rule out bacterial infection or co-infection with other pathogens not detected by the test. Clinical correlation with patient history and other diagnostic information is necessary to determine patient infection status. The expected result is Negative.  Fact Sheet for Patients: EntrepreneurPulse.com.au  Fact Sheet for Healthcare Providers: IncredibleEmployment.be  This test is not yet approved or cleared by the [place] FDA and  has been authorized for detection and/or diagnosis of SARS-CoV-2 by FDA under an Emergency Use Authorization (EUA).  This EUA will remain in effect (meaning this test can be u sed) for the duration of  the COVID-19 declaration under Section 564(b)(1) of the Act, 21 U.S.C. section 360bbb-3(b)(1), unless the authorization is terminated or revoked sooner.     Influenza A by PCR NEGATIVE NEGATIVE Final   Influenza B by PCR NEGATIVE NEGATIVE Final    Comment: (NOTE) The Xpert Xpress SARS-CoV-2/FLU/RSV plus assay is intended as  an aid in the diagnosis of influenza from Nasopharyngeal swab specimens and should not be used as a sole basis for treatment. Nasal washings and aspirates are unacceptable for Xpert Xpress SARS-CoV-2/FLU/RSV testing.  Fact Sheet for Patients: EntrepreneurPulse.com.au  Fact Sheet for Healthcare Providers: IncredibleEmployment.be  This test is not yet approved or cleared by the [place] FDA and has been authorized for detection and/or diagnosis of SARS-CoV-2 by FDA under an Emergency Use Authorization (EUA). This EUA will remain in effect (meaning this test can be used) for the duration of the COVID-19 declaration under Section 564(b)(1) of the Act, 21 U.S.C. section 360bbb-3(b)(1), unless the authorization is  terminated or revoked.  Performed at [facility], [place], [place] [address]   Group A Strep by PCR [facility] Only)     Status: None   Collection Time: 02/17/21  7:55 AM   Specimen: Nasopharyngeal Swab; Sterile Swab  Result Value Ref Range Status   Group A Strep by PCR NOT DETECTED NOT DETECTED Final    Comment: Performed at [facility], [address], [place], [place] [address]     Radiology Studies: No results found.  Scheduled Meds:  aspirin EC  81 mg Oral Daily   febuxostat  40 mg Oral Daily   furosemide  40 mg Intravenous Once   guaiFENesin  600 mg Oral BID   loratadine  10 mg Oral Daily   rivaroxaban  20 mg Oral Daily   sodium chloride flush  3 mL Intravenous Q12H   Continuous Infusions:  sodium chloride     lactated ringers 100 mL/hr at 02/19/21 1042   remdesivir 100 mg in NS 100 mL 100 mg (02/19/21 0945)     LOS: 2 days   Time spent: 40 minutes. More than 50% of the time was spent in counseling/coordination of care  Lorella Nimrod, MD [facility]  If 7PM-7AM, please contact night-coverage [email or web address]  02/19/2021, 3:48 PM   This record has been created using Systems analyst. Errors have been sought and corrected,but may not always be located. Such creation errors do not reflect on the standard of care.

## 2021-02-19 NOTE — Progress Notes (Signed)
Physical Therapy Treatment Patient Details Name: Nicholas [name] MRN: [id number] DOB: Jan 02, 1946 Today's Date: 02/19/2021   History of Present Illness Patient is a 75 y.o. male with medical history significant for atrial fibrillation, history of pulmonary embolism, TIA, gout who presents to the ER for evaluation of weakness status post fall. Found to be COVID positive.    PT Comments    Pt in bed upon entry, agreeable to session. Pt remains weak compared to baseline, still needs minA to EOB, modA to stand, able to AMB in room thrice, but HR jumps to 140s-150s rapidly (no CP, dizziness, dyspnea)- MD made aware. Room air adequate for sats. No orthostasis from supine to sitting. Pt attempted bowel movement in BR, but no success. Pt up in chair at end of session, RN made aware of full suction canister. Tele battery changed as it is dead upon arrival.   Recommendations for follow up therapy are one component of a multi-disciplinary discharge planning process, led by the attending physician.  Recommendations may be updated based on patient status, additional functional criteria and insurance authorization.  Follow Up Recommendations  Skilled nursing-short term rehab (<3 hours/day)     Assistance Recommended at Discharge Frequent or constant Supervision/Assistance  Equipment Recommendations  Rolling walker (2 wheels)    Recommendations for Other Services       Precautions / Restrictions Precautions Precautions: Fall Restrictions Weight Bearing Restrictions: No     Mobility  Bed Mobility Overal bed mobility: Needs Assistance Bed Mobility: Supine to Sit     Supine to sit: Min assist     General bed mobility comments: minA with scooting    Transfers Overall transfer level: Needs assistance Equipment used: Rolling walker (2 wheels) Transfers: Sit to/from Stand Sit to Stand: Mod assist           General transfer comment: able to rise to standing from elevated height with  light minA, EOB x2, BSC x1    Ambulation/Gait Ambulation/Gait assistance: Min guard Gait Distance (Feet): 24 Feet (78ft; rest; 63ft; toiletting 109ft;) Assistive device: Rolling walker (2 wheels) Gait Pattern/deviations: WFL(Within Functional Limits)           Stairs             Wheelchair Mobility    Modified Rankin (Stroke Patients Only)       Balance                                            Cognition Arousal/Alertness: Awake/alert Behavior During Therapy: WFL for tasks assessed/performed Overall Cognitive Status: No family/caregiver present to determine baseline cognitive functioning                                 General Comments: somewhat foggier than baseline, but WNL        Exercises General Exercises - Lower Extremity Long Arc Quad: AROM;Both;15 reps;Supine;Seated Other Exercises Other Exercises: alternate FWD/backward AMB to window from bed x4 [name] RW Other Exercises: standing minisquats x10 with RW    General Comments        Pertinent Vitals/Pain Pain Assessment:  (soreness stiffness in knees bilat)    Home Living                          Prior Function  PT Goals (current goals can now be found in the care plan section) Acute Rehab PT Goals Patient Stated Goal: to be able to walk PT Goal Formulation: With patient Time For Goal Achievement: 03/04/21 Potential to Achieve Goals: Fair Progress towards PT goals: Progressing toward goals    Frequency    Min 2X/week      PT Plan Current plan remains appropriate    Co-evaluation              AM-PAC PT "6 Clicks" Mobility   Outcome Measure  Help needed turning from your back to your side while in a flat bed without using bedrails?: A Lot Help needed moving from lying on your back to sitting on the side of a flat bed without using bedrails?: A Lot Help needed moving to and from a bed to a chair (including a wheelchair)?: A  Lot Help needed standing up from a chair using your arms (e.g., wheelchair or bedside chair)?: A Lot Help needed to walk in hospital room?: A Little Help needed climbing 3-5 steps with a railing? : A Lot 6 Click Score: 13    End of Session   Activity Tolerance: Patient tolerated treatment well Patient left: with call bell/phone within reach;in chair;with chair alarm set Nurse Communication: Mobility status PT Visit Diagnosis: Unsteadiness on feet (R26.81);Muscle weakness (generalized) (M62.81)     Time: 3612-2449 PT Time Calculation (min) (ACUTE ONLY): 39 min  Charges:  $Gait Training: 8-22 mins $Therapeutic Exercise: 8-22 mins $Neuromuscular Re-education: 8-22 mins                    4:16 PM, 02/19/21 Nicholas [name], PT, DPT Physical Therapist - [facility]  [phone] ([place])    Nicholas [name] 02/19/2021, 4:13 PM

## 2021-02-19 NOTE — TOC Initial Note (Addendum)
Transition of Care [facility]) - Initial/Assessment Note    Patient Details  Name: Nicholas [name] MRN: [id number] Date of Birth: June 02, 1945  [facility] CM/SW Contact:    Pete Pelt, RN Phone Number: 02/19/2021, 10:44 AM  Clinical Narrative:    Patient unable to speak on the phone, spoke to wife via phone.  Wife states she has Parkinsons and is unable to care for patient if he is unable to to walk.  Prior to this hospitalization, there are no concerns with transportation to appointments or the pharmacy, and patient is able to take medications as directed.  Patient does not currently have home health or need DME.  Patient is currently weak, and will require rehab at SNF, family would like bed search in the [place] area, Bed search started, will communicated bed offers to family.  TOC contact information provided, TOC to follow to discharge.            Addendum:  Patient tested + for COVID 02/17/21, eligible to transfer to facility 02/28/21    Expected Discharge Plan: [place] Barriers to Discharge: Continued Medical Work up   Patient Goals and CMS Choice     Choice offered to / list presented to : Spouse  Expected Discharge Plan and Services Expected Discharge Plan: [place]   Discharge Planning Services: CM Consult   Living arrangements for the past 2 months: Single Family Home                                      Prior Living Arrangements/Services Living arrangements for the past 2 months: Single Family Home Lives with:: Self, Spouse Patient language and need for interpreter reviewed:: Yes (no interpreter required) Do you feel safe going back to the place where you live?: Yes (After rehabilitation)      Need for Family Participation in Patient Care: Yes (Comment) Care giver support system in place?: Yes (comment)   Criminal Activity/Legal Involvement Pertinent to Current Situation/Hospitalization: No - Comment  as needed  Activities of Daily Living Home Assistive Devices/Equipment: Cane (specify quad or straight) ADL Screening (condition at time of admission) Patient's cognitive ability adequate to safely complete daily activities?: Yes Is the patient deaf or have difficulty hearing?: No Does the patient have difficulty seeing, even when wearing glasses/contacts?: No Does the patient have difficulty concentrating, remembering, or making decisions?: Yes Patient able to express need for assistance with ADLs?: Yes Does the patient have difficulty dressing or bathing?: No Independently performs ADLs?: No Communication: Independent Dressing (OT): Independent Feeding: Independent In/Out Bed: Needs assistance Walks in Home: Independent Does the patient have difficulty walking or climbing stairs?: No Weakness of Legs: Both Weakness of Arms/Hands: Both  Permission Sought/Granted Permission sought to share information with : Case Manager Permission granted to share information with : Yes, Verbal Permission Granted     Permission granted to share info w AGENCY: Prospective SNF        Emotional Assessment Appearance::  (spoke to wife via phone)       Alcohol / Substance Use: Not Applicable Psych Involvement: No (comment)  Admission diagnosis:  Weak [R53.1] Hypotensive episode [I95.9] Fall, initial encounter [W19.XXXA] COVID [U07.1] COVID-19 virus infection [U07.1] Patient Active Problem List   Diagnosis Date Noted   COVID-19 virus infection 02/17/2021   A-fib ([place])    TIA (transient ischemic attack)    Lactic acidosis  Hyponatremia    Hypotension    Generalized weakness    PCP:  Rusty Aus, MD Pharmacy:   [facility] [place], [place] [address] [name] [address] Phone: [phone] Fax: [phone]     Social Determinants of Health (SDOH) Interventions     Readmission Risk Interventions No flowsheet data found.

## 2021-02-20 ENCOUNTER — Inpatient Hospital Stay (HOSPITAL_COMMUNITY)
Admit: 2021-02-20 | Discharge: 2021-02-20 | Disposition: A | Discharge status: Home / Self Care | Payer: Medicare Other | Attending: Student | Admitting: Student

## 2021-02-20 DIAGNOSIS — R55 Syncope and collapse: Secondary | ICD-10-CM

## 2021-02-20 LAB — CBC
HCT: 41.7 % (ref 39.0–52.0)
Hemoglobin: 14.3 g/dL (ref 13.0–17.0)
MCH: 31.6 pg (ref 26.0–34.0)
MCHC: 34.3 g/dL (ref 30.0–36.0)
MCV: 92.3 fL (ref 80.0–100.0)
Platelets: 155 10*3/uL (ref 150–400)
RBC: 4.52 MIL/uL (ref 4.22–5.81)
RDW: 12.4 % (ref 11.5–15.5)
WBC: 5.5 10*3/uL (ref 4.0–10.5)
nRBC: 0 % (ref 0.0–0.2)

## 2021-02-20 LAB — COMPREHENSIVE METABOLIC PANEL
ALT: 34 U/L (ref 0–44)
AST: 74 U/L — ABNORMAL HIGH (ref 15–41)
Albumin: 3.2 g/dL — ABNORMAL LOW (ref 3.5–5.0)
Alkaline Phosphatase: 39 U/L (ref 38–126)
Anion gap: 6 (ref 5–15)
BUN: 14 mg/dL (ref 8–23)
CO2: 24 mmol/L (ref 22–32)
Calcium: 8.3 mg/dL — ABNORMAL LOW (ref 8.9–10.3)
Chloride: 105 mmol/L (ref 98–111)
Creatinine, Ser: 0.94 mg/dL (ref 0.61–1.24)
GFR, Estimated: >60 mL/min (ref >60)
Glucose, Bld: 91 mg/dL (ref 70–99)
Potassium: 3.6 mmol/L (ref 3.5–5.1)
Sodium: 135 mmol/L (ref 135–145)
Total Bilirubin: 0.8 mg/dL (ref 0.3–1.2)
Total Protein: 5.7 g/dL — ABNORMAL LOW (ref 6.5–8.1)

## 2021-02-20 LAB — C-REACTIVE PROTEIN: CRP: 4.6 mg/dL — ABNORMAL HIGH (ref <1.0)

## 2021-02-20 LAB — ECHOCARDIOGRAM COMPLETE BUBBLE STUDY
AR max vel: 2.3 cm2
AV Area VTI: 2.72 cm2
AV Area mean vel: 2.43 cm2
AV Mean grad: 1 mmHg
AV Peak grad: 2.9 mmHg
Ao pk vel: 0.85 m/s
Area-P 1/2: 5.38 cm2
MV VTI: 2.48 cm2
S' Lateral: 3 cm

## 2021-02-20 LAB — CK: Total CK: 872 U/L — ABNORMAL HIGH (ref 49–397)

## 2021-02-20 NOTE — Progress Notes (Signed)
*  PRELIMINARY RESULTS* Echocardiogram 2D Echocardiogram has been performed.  Sherrie Sport 02/20/2021, 8:34 AM

## 2021-02-20 NOTE — TOC Progression Note (Signed)
Transition of Care [facility]) - Progression Note    Patient Details  Name: Nicholas [name] MRN: [id number] Date of Birth: 04-01-1945  Transition of Care [facility]) CM/SW [place], RN Phone Number: 02/20/2021, 2:02 PM  Clinical Narrative:   Bed offers from [facility] provided to spouse.  She would like to check with daughter and call rNCM back.  Toc to follow    Expected Discharge Plan: [place] Barriers to Discharge: Continued Medical Work up  Expected Discharge Plan and Services Expected Discharge Plan: [place]   Discharge Planning Services: CM Consult   Living arrangements for the past 2 months: Single Family Home                                       Social Determinants of Health (SDOH) Interventions    Readmission Risk Interventions No flowsheet data found.

## 2021-02-20 NOTE — Progress Notes (Signed)
Occupational Therapy Treatment Patient Details Name: Nicholas [name] MRN: [id number] DOB: [birth year] Today's Date: 02/20/2021   History of present illness Patient is a 75 y.o. male with medical history significant for atrial fibrillation, history of pulmonary embolism, TIA, gout who presents to the ER for evaluation of weakness status post fall. Found to be COVID positive.   OT comments  Upon entering the room, pt supine in bed and agreeable to OT intervention. Pt needing mod A for supine >sit to EOB. Min guard for sitting balance while donning B socks. PT fatigues very quickly this session. He sits and brushes teeth with set up A to obtain needed items. Pt standing x 2 reps with mod lifting assistance. Posterior bias in standing requiring mod A for standing balance. Steps laterally but pt unable to maintain for long secondary to fatigue and returns to sitting. Lateral scoots back up to [facility] with min A. Pt returns to supine with min A for trunk. All needs within reach. OT continues to recommend discharge to short term rehab to address functional deficits before returning home.    Recommendations for follow up therapy are one component of a multi-disciplinary discharge planning process, led by the attending physician.  Recommendations may be updated based on patient status, additional functional criteria and insurance authorization.    Follow Up Recommendations  Skilled nursing-short term rehab (<3 hours/day)    Assistance Recommended at Discharge Frequent or constant Supervision/Assistance  Equipment Recommendations  Other (comment) (defer to next venue of care)       Precautions / Restrictions Precautions Precautions: Fall Restrictions Weight Bearing Restrictions: No       Mobility Bed Mobility Overal bed mobility: Needs Assistance Bed Mobility: Supine to Sit     Supine to sit: Mod assist Sit to supine: Min assist        Transfers Overall transfer level: Needs  assistance Equipment used: 1 person hand held assist Transfers: Sit to/from Stand Sit to Stand: Mod assist           General transfer comment: lateral scoots with min A along EOB and mod lifting assistance to stand from EOB     Balance Overall balance assessment: Needs assistance;History of Falls Sitting-balance support: Feet supported;No upper extremity supported Sitting balance-Leahy Scale: Fair     Standing balance support: Bilateral upper extremity supported Standing balance-Leahy Scale: Poor Standing balance comment: Moderate assistance required to maintain  standing balance                           ADL either performed or assessed with clinical judgement   ADL Overall ADL's : Needs assistance/impaired     Grooming: Wash/dry hands;Wash/dry face;Oral care;Set up;Sitting;Min guard               Lower Body Dressing: Minimal assistance;Sitting/lateral leans Lower Body Dressing Details (indicate cue type and reason): to don B socks while seated on EOB                    Extremity/Trunk Assessment Upper Extremity Assessment Upper Extremity Assessment: Generalized weakness   Lower Extremity Assessment Lower Extremity Assessment: Generalized weakness        Vision Patient Visual Report: No change from baseline            Cognition Arousal/Alertness: Awake/alert Behavior During Therapy: WFL for tasks assessed/performed Overall Cognitive Status: No family/caregiver present to determine baseline cognitive functioning  General Comments: Pt needing cuing for redirection.                     Pertinent Vitals/ Pain       Pain Assessment: Faces Faces Pain Scale: Hurts even more Pain Location: generalized Pain Descriptors / Indicators: Discomfort;Stabbing Pain Intervention(s): Monitored during session;Limited activity within patient's tolerance;Repositioned         Frequency  Min  2X/week        Progress Toward Goals  OT Goals(current goals can now be found in the care plan section)  Progress towards OT goals: Progressing toward goals  Acute Rehab OT Goals Patient Stated Goal: to get better OT Goal Formulation: With patient Time For Goal Achievement: 03/04/21 Potential to Achieve Goals: Good  Plan Discharge plan remains appropriate;Frequency remains appropriate       AM-PAC OT "6 Clicks" Daily Activity     Outcome Measure   Help from another person eating meals?: None Help from another person taking care of personal grooming?: A Little Help from another person toileting, which includes using toliet, bedpan, or urinal?: A Lot Help from another person bathing (including washing, rinsing, drying)?: A Lot Help from another person to put on and taking off regular upper body clothing?: A Little Help from another person to put on and taking off regular lower body clothing?: A Lot 6 Click Score: 16    End of Session Equipment Utilized During Treatment: Rolling walker (2 wheels)  OT Visit Diagnosis: Unsteadiness on feet (R26.81);Other abnormalities of gait and mobility (R26.89);Muscle weakness (generalized) (M62.81)   Activity Tolerance Patient tolerated treatment well   Patient Left in bed;with call bell/phone within reach;with bed alarm set   Nurse Communication Other (comment)        Time: 1428-1500 OT Time Calculation (min): 32 min  Charges: OT General Charges $OT Visit: 1 Visit OT Treatments $Self Care/Home Management : 8-22 mins $Therapeutic Activity: 8-22 mins  Darleen Crocker, MS, OTR/L , CBIS ascom [phone]  02/20/21, 4:54 PM

## 2021-02-20 NOTE — Progress Notes (Signed)
PROGRESS NOTE    Nicholas [name]  ZOX:[id number] DOB: Jul 25, 1945 DOA: 02/17/2021 PCP: Rusty Aus, MD   Brief Narrative: Taken from prior notes. Nicholas [name] is a 75 y.o. male with medical history significant for atrial fibrillation, history of pulmonary embolism, TIA, gout who presents to the ER for evaluation of weakness status post fall. Found to have a positive PCR for COVID-19. Imaging without any acute abnormality.  CT abdomen with bilateral renal cyst without any significant change.  Left-sided nephrolithiasis without obstruction. He was started on remdesivir. PT is recommending SNF-patient need to wait 10 days after positive COVID test.  Tested positive on 02/17/2021-should be able to go on 02/28/2021 Also found to have elevated CK level due to rhabdomyolysis secondary to fall.  CK level started trending down.  Subjective: No overnight concerns.  Patient wants to go home but remained very weak and becoming tachycardic with minor exertion.  Wife and patient agrees on going to rehab.  Assessment & Plan:   Principal Problem:   COVID-19 virus infection Active Problems:   A-fib ([place])   TIA (transient ischemic attack)   Lactic acidosis   Hyponatremia   Hypotension   Generalized weakness  COVID-19 viral infection.  Tested positive on 02/17/2021.  Received 2 doses of vaccine, no booster. Currently on room air.  Chest x-ray without any sign of pneumonia. Initially met sepsis criteria with tachypnea and lactic acidosis.  Lactic acidosis has been resolved. He was started on remdesivir-day 3 -Continue with supportive care and supplement.  Rhabdomyolysis.  Secondary to fall.  CK level started trending down. -Discontinue IV fluid -Monitor CK  Generalized weakness/falls.  PT is recommending SNF -Patient has to wait 10 days due to positive COVID PCR, should be able to go on 01/29/2021. -TOC to find placement  Permanent atrial fibrillation.  Home dose of atenolol was  initially held due to hypotension. -Restarted home atenolol -Continue with Xarelto  Lower extremity edema.  No prior diagnosis of heart failure.  Mildly elevated BNP at 385. -Echocardiogram done-pending results.  Received 1 dose of IV Lasix yesterday -Continue to monitor  Objective: Vitals:   02/20/21 0300 02/20/21 0401 02/20/21 0838 02/20/21 1124  BP:  119/81 122/79 (!) 115/59  Pulse:  86 91 94  Resp: _0 Temp:  98.5 F (36.9 C) 98.1 F (36.7 C) 97.9 F (36.6 C)  TempSrc:  Oral Oral Oral  SpO2:  93% 95% 95%  Weight:      Height:        Intake/Output Summary (Last 24 hours) at 02/20/2021 1533 Last data filed at 02/20/2021 0408 Gross per 24 hour  Intake 1771.67 ml  Output 1800 ml  Net -28.33 ml    Filed Weights   02/17/21 0748  Weight: 114.8 kg    Examination:  General.  Well-developed elderly man, in no acute distress. Pulmonary.  Lungs clear bilaterally, normal respiratory effort. CV.  Regular rate and rhythm, no JVD, rub or murmur. Abdomen.  Soft, nontender, nondistended, BS positive. CNS.  Alert and oriented .  No focal neurologic deficit. Extremities.  Trace LE edema, no cyanosis, pulses intact and symmetrical. Psychiatry.  Judgment and insight appears normal.   DVT prophylaxis: Xarelto Code Status: Full Family Communication: Wife was updated on phone Disposition Plan:  Status is: Inpatient  Remains inpatient appropriate because: Severity of illness   Level of care: Telemetry Cardiac  All the records are reviewed and case discussed with Care Management/Social Worker. Management plans discussed  with the patient, nursing and they are in agreement.  Consultants:  None  Procedures:  Antimicrobials:   Data Reviewed: I have personally reviewed following labs and imaging studies  CBC: Recent Labs  Lab 02/17/21 0755 02/18/21 0440 02/19/21 0608 02/20/21 0621  WBC 7.4 5.5 4.4 5.5  NEUTROABS 5.8  --   --   --   HGB 13.9 13.3 13.6 14.3   HCT 41.7 39.2 40.1 41.7  MCV 96.3 95.8 94.1 92.3  PLT 202 156 153 269    Basic Metabolic Panel: Recent Labs  Lab 02/17/21 0755 02/18/21 0440 02/19/21 0608 02/20/21 0621  NA 133* 130* 133* 135  K 3.7 3.9 3.4* 3.6  CL 104 103 107 105  CO2 19* 21* 20* 24  GLUCOSE 88 99 113* 91  BUN _0 CREATININE 1.07 1.07 1.00 0.94  CALCIUM 8.7* 8.4* 8.1* 8.3*  MG  --  1.9 1.8  --   PHOS  --  3.5 3.6  --     GFR: Estimated Creatinine Clearance: 92.7 mL/min (by C-G formula based on SCr of 0.94 mg/dL). Liver Function Tests: Recent Labs  Lab 02/17/21 0755 02/20/21 0621  AST 40 74*  ALT 19 34  ALKPHOS 48 39  BILITOT 0.8 0.8  PROT 5.9* 5.7*  ALBUMIN 3.7 3.2*    No results for input(s): LIPASE, AMYLASE in the last 168 hours. No results for input(s): AMMONIA in the last 168 hours. Coagulation Profile: No results for input(s): INR, PROTIME in the last 168 hours. Cardiac Enzymes: Recent Labs  Lab 02/18/21 0440 02/19/21 0608 02/20/21 0621  CKTOTAL 981* 1,385* 872*    BNP (last 3 results) No results for input(s): PROBNP in the last 8760 hours. HbA1C: No results for input(s): HGBA1C in the last 72 hours. CBG: No results for input(s): GLUCAP in the last 168 hours. Lipid Profile: No results for input(s): CHOL, HDL, LDLCALC, TRIG, CHOLHDL, LDLDIRECT in the last 72 hours. Thyroid Function Tests: No results for input(s): TSH, T4TOTAL, FREET4, T3FREE, THYROIDAB in the last 72 hours. Anemia Panel: Recent Labs    02/18/21 1155  VITAMINB12 447    Sepsis Labs: Recent Labs  Lab 02/17/21 0755 02/17/21 0946 02/19/21 0608  PROCALCITON <0.10  --   --   LATICACIDVEN 3.4* 2.0* 1.2     Recent Results (from the past 240 hour(s))  Resp Panel by RT-PCR (Flu A&B, Covid) Nasopharyngeal Swab     Status: Abnormal   Collection Time: 02/17/21  7:55 AM   Specimen: Nasopharyngeal Swab; Nasopharyngeal(NP) swabs in vial transport medium  Result Value Ref Range Status   SARS  Coronavirus 2 by RT PCR POSITIVE (A) NEGATIVE Final    Comment: RESULT CALLED TO, READ BACK BY AND VERIFIED WITH: ANTACIA BELL _1  02/17/21 MJU (NOTE) SARS-CoV-2 target nucleic acids are DETECTED.  The SARS-CoV-2 RNA is generally detectable in upper respiratory specimens during the acute phase of infection. Positive results are indicative of the presence of the identified virus, but do not rule out bacterial infection or co-infection with other pathogens not detected by the test. Clinical correlation with patient history and other diagnostic information is necessary to determine patient infection status. The expected result is Negative.  Fact Sheet for Patients: EntrepreneurPulse.com.au  Fact Sheet for Healthcare Providers: IncredibleEmployment.be  This test is not yet approved or cleared by the [place] FDA and  has been authorized for detection and/or diagnosis of SARS-CoV-2 by FDA under an Emergency Use Authorization (EUA).  This EUA will  remain in effect (meaning this test can be u sed) for the duration of  the COVID-19 declaration under Section 564(b)(1) of the Act, 21 U.S.C. section 360bbb-3(b)(1), unless the authorization is terminated or revoked sooner.     Influenza A by PCR NEGATIVE NEGATIVE Final   Influenza B by PCR NEGATIVE NEGATIVE Final    Comment: (NOTE) The Xpert Xpress SARS-CoV-2/FLU/RSV plus assay is intended as an aid in the diagnosis of influenza from Nasopharyngeal swab specimens and should not be used as a sole basis for treatment. Nasal washings and aspirates are unacceptable for Xpert Xpress SARS-CoV-2/FLU/RSV testing.  Fact Sheet for Patients: EntrepreneurPulse.com.au  Fact Sheet for Healthcare Providers: IncredibleEmployment.be  This test is not yet approved or cleared by the [place] FDA and has been authorized for detection and/or diagnosis of SARS-CoV-2 by FDA  under an Emergency Use Authorization (EUA). This EUA will remain in effect (meaning this test can be used) for the duration of the COVID-19 declaration under Section 564(b)(1) of the Act, 21 U.S.C. section 360bbb-3(b)(1), unless the authorization is terminated or revoked.  Performed at [facility], [place], [place] [address]   Group A Strep by PCR [facility] Only)     Status: None   Collection Time: 02/17/21  7:55 AM   Specimen: Nasopharyngeal Swab; Sterile Swab  Result Value Ref Range Status   Group A Strep by PCR NOT DETECTED NOT DETECTED Final    Comment: Performed at [facility], [address] [place], [place] [address]      Radiology Studies: No results found.  Scheduled Meds:  aspirin EC  81 mg Oral Daily   atenolol  12.5 mg Oral BID   febuxostat  40 mg Oral Daily   guaiFENesin  600 mg Oral BID   loratadine  10 mg Oral Daily   rivaroxaban  20 mg Oral Daily   sodium chloride flush  3 mL Intravenous Q12H   Continuous Infusions:  sodium chloride     remdesivir 100 mg in NS 100 mL 100 mg (02/20/21 1018)     LOS: 3 days   Time spent: 38 minutes. More than 50% of the time was spent in counseling/coordination of care  Lorella Nimrod, MD [facility]  If 7PM-7AM, please contact night-coverage [email or web address]  02/20/2021, 3:33 PM   This record has been created using Systems analyst. Errors have been sought and corrected,but may not always be located. Such creation errors do not reflect on the standard of care.

## 2021-02-21 ENCOUNTER — Ambulatory Visit: Payer: Medicare Other

## 2021-02-21 LAB — CK: Total CK: 481 U/L — ABNORMAL HIGH (ref 49–397)

## 2021-02-21 NOTE — Progress Notes (Signed)
MD made aware of pauses on telemetry, 2.65 seconds at 1203 and 2.79 seconds at 1725. No new orders continue to monitor.

## 2021-02-21 NOTE — Progress Notes (Signed)
Physical Therapy Treatment Patient Details Name: Nicholas [name] MRN: [id number] DOB: 11-28-1945 Today's Date: 02/21/2021   History of Present Illness Patient is a 75 y.o. male with medical history significant for atrial fibrillation, history of pulmonary embolism, TIA, gout who presents to the ER for evaluation of weakness status post fall. Found to be COVID positive.    PT Comments    Pt in bed on arrival, still satting well on room air, HR better controled. Pt assisted for mobility to [facility] for bowel urgency, then room prepped for BSC to recliner after BM. Pt then prepped for mobility out of room, new gown and socks, surgical droplet mask. Pt able to AMB 1x around unit, then after recovery interval AMB twice around unit. HR remains controlled. Pt has generally good balance, but does have a few LOB, mostly due to awkward control of RW, some of which is better with height increase, and will subsequently be improved with transition to wider RW. Pt able to take some steps in room unsupported, no LOB, but his device free mobility is not at baseline. Pt progressive well in general, if he is able to perform 5 stairs tomorrow, PT will update his DC recommendations to home with HHPT.     Recommendations for follow up therapy are one component of a multi-disciplinary discharge planning process, led by the attending physician.  Recommendations may be updated based on patient status, additional functional criteria and insurance authorization.  Follow Up Recommendations  Skilled nursing-short term rehab (<3 hours/day)     Assistance Recommended at Discharge Frequent or constant Supervision/Assistance  Equipment Recommendations  Rolling walker (2 wheels)    Recommendations for Other Services       Precautions / Restrictions Precautions Precautions: Fall Restrictions Weight Bearing Restrictions: No     Mobility  Bed Mobility Overal bed mobility: Needs Assistance Bed Mobility: Supine to Sit      Supine to sit: Min guard;HOB elevated          Transfers   Equipment used: Rolling walker (2 wheels) Transfers: Sit to/from Stand Sit to Stand: Min guard           General transfer comment: stronger than yesterday but requires considerable effort from chair + pillow.    Ambulation/Gait   Gait Distance (Feet): 400 Feet (281ft; rest; 477ft) Assistive device: Rolling walker (2 wheels) Gait Pattern/deviations: WFL(Within Functional Limits) Gait velocity: 0.91m/s   Pre-gait activities: FWD and retro AMB in room without support, not at baseline, but rigid and unsure. LOB 1x while static standing waiting for RW adjustment. General Gait Details: slightly uncoordinated at times, posutral weakness, kicks walker a few time,s but is easily distracted looking around the unit as he walks, albeit he reports to feel more steady he does have 2 LOB while up.   Stairs Stairs:  (will need to perform prior to upgrade to home.)           Wheelchair Mobility    Modified Rankin (Stroke Patients Only)       Balance Overall balance assessment: Needs assistance;Mild deficits observed, not formally tested;History of Falls                                          Cognition Arousal/Alertness: Awake/alert Behavior During Therapy: WFL for tasks assessed/performed Overall Cognitive Status: No family/caregiver present to determine baseline cognitive functioning  Exercises Other Exercises Other Exercises: has been working on heel raises, LAQ, marching on his own between sessions Other Exercises: standing minisquats x10 with RW (trying to stretch his calves)    General Comments        Pertinent Vitals/Pain Pain Assessment: No/denies pain    Home Living                          Prior Function            PT Goals (current goals can now be found in the care plan section) Acute Rehab PT  Goals Patient Stated Goal: to be able to walk PT Goal Formulation: With patient Time For Goal Achievement: 03/04/21 Potential to Achieve Goals: Fair Progress towards PT goals: Progressing toward goals    Frequency    Min 2X/week      PT Plan Current plan remains appropriate    Co-evaluation              AM-PAC PT "6 Clicks" Mobility   Outcome Measure  Help needed turning from your back to your side while in a flat bed without using bedrails?: A Lot Help needed moving from lying on your back to sitting on the side of a flat bed without using bedrails?: A Lot Help needed moving to and from a bed to a chair (including a wheelchair)?: A Lot Help needed standing up from a chair using your arms (e.g., wheelchair or bedside chair)?: A Lot Help needed to walk in hospital room?: A Little Help needed climbing 3-5 steps with a railing? : A Lot 6 Click Score: 13    End of Session Equipment Utilized During Treatment: Gait belt Activity Tolerance: Patient tolerated treatment well;No increased pain Patient left: with call bell/phone within reach;in chair Nurse Communication: Mobility status (blood in IV line, need for catheter garter) PT Visit Diagnosis: Unsteadiness on feet (R26.81);Muscle weakness (generalized) (M62.81)     Time: 1455-1550 PT Time Calculation (min) (ACUTE ONLY): 55 min  Charges:  $Gait Training: 23-37 mins $Therapeutic Exercise: 23-37 mins                    4:28 PM, 02/21/21 [name], PT, DPT Physical Therapist - [facility]  [phone] ([place])   [place] C 02/21/2021, 4:23 PM

## 2021-02-21 NOTE — Progress Notes (Signed)
PROGRESS NOTE    BRAND SIEVER  IWL:[id number] DOB: Sep 23, 1945 DOA: 02/17/2021 PCP: Rusty Aus, MD   Brief Narrative: Taken from prior notes. Nicholas [name] is a 75 y.o. male with medical history significant for atrial fibrillation, history of pulmonary embolism, TIA, gout who presents to the ER for evaluation of weakness status post fall. Found to have a positive PCR for COVID-19. Imaging without any acute abnormality.  CT abdomen with bilateral renal cyst without any significant change.  Left-sided nephrolithiasis without obstruction. He was started on remdesivir. PT is recommending SNF-patient need to wait 10 days after positive COVID test.  Tested positive on 02/17/2021-should be able to go on 02/28/2021 Also found to have elevated CK level due to rhabdomyolysis secondary to fall.  CK level started trending down.  Subjective: Patient was seen and examined today.  No new complaints.  He wants to get some sleep and was resting comfortably.  Assessment & Plan:   Principal Problem:   COVID-19 virus infection Active Problems:   A-fib ([place])   TIA (transient ischemic attack)   Lactic acidosis   Hyponatremia   Hypotension   Generalized weakness  COVID-19 viral infection.  Tested positive on 02/17/2021.  Received 2 doses of vaccine, no booster. Currently on room air.  Chest x-ray without any sign of pneumonia. Initially met sepsis criteria with tachypnea and lactic acidosis.  Lactic acidosis has been resolved. He was started on remdesivir-day 4 -Continue with supportive care and supplement.  Rhabdomyolysis.  Resolved Secondary to fall.  CK level started trending down.  IV fluid was discontinued  Generalized weakness/falls.  PT is recommending SNF -Patient has to wait 10 days due to positive COVID PCR, should be able to go on 01/29/2021. -TOC to find placement  Permanent atrial fibrillation.  Home dose of atenolol was initially held due to hypotension.  Currently rate  well controlled. -Restarted home atenolol -Continue with Xarelto  Lower extremity edema.  No prior diagnosis of heart failure.  Mildly elevated BNP at 385. -Echocardiogram with normal EF, no regional wall motion abnormalities and indeterminate diastolic function. -Continue to monitor  Objective: Vitals:   02/20/21 2017 02/21/21 0102 02/21/21 0457 02/21/21 0802  BP: 109/63 96/81 (!) 99/51 103/86  Pulse: 90 71 79 77  Resp: _0 Temp: 98.3 F (36.8 C) 98.6 F (37 C) 97.8 F (36.6 C) (!) 97.4 F (36.3 C)  TempSrc: Oral Oral Oral Oral  SpO2: 96% 94% 97% 95%  Weight:      Height:        Intake/Output Summary (Last 24 hours) at 02/21/2021 1226 Last data filed at 02/21/2021 0456 Gross per 24 hour  Intake --  Output 1500 ml  Net -1500 ml    Filed Weights   02/17/21 0748  Weight: 114.8 kg    Examination:  General.  Well developed elderly man, In no acute distress. Pulmonary.  Lungs clear bilaterally, normal respiratory effort. CV.  Regular rate and rhythm, no JVD, rub or murmur. Abdomen.  Soft, nontender, nondistended, BS positive. CNS.  Alert and oriented .  No focal neurologic deficit. Extremities.  Trace LE edema, no cyanosis, pulses intact and symmetrical. Psychiatry.  Judgment and insight appears normal.   DVT prophylaxis: Xarelto Code Status: Full Family Communication:  Disposition Plan:  Status is: Inpatient  Remains inpatient appropriate because: Severity of illness   Level of care: Telemetry Cardiac  All the records are reviewed and case discussed with Care Management/Social Worker. Management  plans discussed with the patient, nursing and they are in agreement.  Consultants:  None  Procedures:  Antimicrobials:   Data Reviewed: I have personally reviewed following labs and imaging studies  CBC: Recent Labs  Lab 02/17/21 0755 02/18/21 0440 02/19/21 0608 02/20/21 0621  WBC 7.4 5.5 4.4 5.5  NEUTROABS 5.8  --   --   --   HGB 13.9 13.3  13.6 14.3  HCT 41.7 39.2 40.1 41.7  MCV 96.3 95.8 94.1 92.3  PLT 202 156 153 376    Basic Metabolic Panel: Recent Labs  Lab 02/17/21 0755 02/18/21 0440 02/19/21 0608 02/20/21 0621  NA 133* 130* 133* 135  K 3.7 3.9 3.4* 3.6  CL 104 103 107 105  CO2 19* 21* 20* 24  GLUCOSE 88 99 113* 91  BUN _0 CREATININE 1.07 1.07 1.00 0.94  CALCIUM 8.7* 8.4* 8.1* 8.3*  MG  --  1.9 1.8  --   PHOS  --  3.5 3.6  --     GFR: Estimated Creatinine Clearance: 92.7 mL/min (by C-G formula based on SCr of 0.94 mg/dL). Liver Function Tests: Recent Labs  Lab 02/17/21 0755 02/20/21 0621  AST 40 74*  ALT 19 34  ALKPHOS 48 39  BILITOT 0.8 0.8  PROT 5.9* 5.7*  ALBUMIN 3.7 3.2*    No results for input(s): LIPASE, AMYLASE in the last 168 hours. No results for input(s): AMMONIA in the last 168 hours. Coagulation Profile: No results for input(s): INR, PROTIME in the last 168 hours. Cardiac Enzymes: Recent Labs  Lab 02/18/21 0440 02/19/21 0608 02/20/21 0621 02/21/21 0614  CKTOTAL 981* 1,385* 872* 481*    BNP (last 3 results) No results for input(s): PROBNP in the last 8760 hours. HbA1C: No results for input(s): HGBA1C in the last 72 hours. CBG: No results for input(s): GLUCAP in the last 168 hours. Lipid Profile: No results for input(s): CHOL, HDL, LDLCALC, TRIG, CHOLHDL, LDLDIRECT in the last 72 hours. Thyroid Function Tests: No results for input(s): TSH, T4TOTAL, FREET4, T3FREE, THYROIDAB in the last 72 hours. Anemia Panel: No results for input(s): VITAMINB12, FOLATE, FERRITIN, TIBC, IRON, RETICCTPCT in the last 72 hours.  Sepsis Labs: Recent Labs  Lab 02/17/21 0755 02/17/21 0946 02/19/21 0608  PROCALCITON <0.10  --   --   LATICACIDVEN 3.4* 2.0* 1.2     Recent Results (from the past 240 hour(s))  Resp Panel by RT-PCR (Flu A&B, Covid) Nasopharyngeal Swab     Status: Abnormal   Collection Time: 02/17/21  7:55 AM   Specimen: Nasopharyngeal Swab; Nasopharyngeal(NP)  swabs in vial transport medium  Result Value Ref Range Status   SARS Coronavirus 2 by RT PCR POSITIVE (A) NEGATIVE Final    Comment: RESULT CALLED TO, READ BACK BY AND VERIFIED WITH: ANTACIA BELL _1  02/17/21 MJU (NOTE) SARS-CoV-2 target nucleic acids are DETECTED.  The SARS-CoV-2 RNA is generally detectable in upper respiratory specimens during the acute phase of infection. Positive results are indicative of the presence of the identified virus, but do not rule out bacterial infection or co-infection with other pathogens not detected by the test. Clinical correlation with patient history and other diagnostic information is necessary to determine patient infection status. The expected result is Negative.  Fact Sheet for Patients: EntrepreneurPulse.com.au  Fact Sheet for Healthcare Providers: IncredibleEmployment.be  This test is not yet approved or cleared by the [place] FDA and  has been authorized for detection and/or diagnosis of SARS-CoV-2 by FDA under an  Use Authorization (EUA).  This EUA will °remain in effect (meaning this test can be u sed) for the duration of  °the COVID-19 declaration under Section 564(b)(1) of the Act, 21 °U.S.C. section 360bbb-3(b)(1), unless the authorization is °terminated or revoked sooner. ° °  ° Influenza A by PCR NEGATIVE NEGATIVE Final  ° Influenza B by PCR NEGATIVE NEGATIVE Final  °  Comment: (NOTE) °The Xpert Xpress SARS-CoV-2/FLU/RSV plus assay is intended as an aid °in the diagnosis of influenza from Nasopharyngeal swab specimens and °should not be used as a sole basis for treatment. Nasal washings and °aspirates are unacceptable for Xpert Xpress SARS-CoV-2/FLU/RSV °testing. ° °Fact Sheet for Patients: °https://www.fda.gov/media/152166/download ° °Fact Sheet for Healthcare Providers: °https://www.fda.gov/media/152162/download ° °This test is not yet approved or cleared by the [place] FDA  and °has been authorized for detection and/or diagnosis of SARS-CoV-2 by °FDA under an Emergency Use Authorization (EUA). This EUA will remain °in effect (meaning this test can be used) for the duration of the °COVID-19 declaration under Section 564(b)(1) of the Act, 21 U.S.C. °section 360bbb-3(b)(1), unless the authorization is terminated or °revoked. ° °Performed at [facility], [address] [place], °[place] [address] °  °Group A Strep by PCR ([facility] Only)     Status: None  ° Collection Time: 02/17/21  7:55 AM  ° Specimen: Nasopharyngeal Swab; Sterile Swab  °Result Value Ref Range Status  ° Group A Strep by PCR NOT DETECTED NOT DETECTED Final  °  Comment: Performed at [facility], [address] [place], [place] [address]  ° °  ° °Radiology Studies: °ECHOCARDIOGRAM COMPLETE BUBBLE STUDY ° °Result Date: 02/20/2021 °   ECHOCARDIOGRAM REPORT   Patient Name:   Inez M Kohlman Date of Exam: 02/20/2021 Medical Rec #:  [id number]        Height:       76.0 in Accession #:    [id number]       Weight:       253.0 lb Date of Birth:  01/13/1946       BSA:          2.448 m² Patient Age:    74 years         BP:           119/81 mmHg Patient Gender: M                HR:           86 bpm. Exam Location:  [facility] Procedure: 2D Echo, Color Doppler, Cardiac Doppler and Saline Contrast Bubble            Study Indications:     Syncope 780.2 / R55  History:         Patient has no prior history of Echocardiogram examinations.                  TIA; Arrythmias:Atrial Fibrillation. PE.  Sonographer:     Jerry Hege Referring Phys:  [id number] DILEEP KUMAR Diagnosing Phys: Brian Agbor-Etang MD  Sonographer Comments: Technically difficult study due to poor echo windows. IMPRESSIONS  1. Left ventricular ejection fraction, by estimation, is 55%. The left ventricle has normal function. The left ventricle has no regional wall motion abnormalities. There is mild left ventricular hypertrophy. Left ventricular diastolic parameters  are indeterminate.  2. Right ventricular systolic function is normal. The right ventricular size is not well visualized.  3. The mitral valve is grossly normal. No evidence of mitral valve regurgitation.    regurgitation.  4. The aortic valve was not well visualized. Aortic valve regurgitation is mild.  5. Aortic dilatation noted. There is mild dilatation of the aortic root, measuring 43 mm.  6. Saline contrast study was suboptimal. FINDINGS  Left Ventricle: Left ventricular ejection fraction, by estimation, is 55%. The left ventricle has normal function. The left ventricle has no regional wall motion abnormalities. The left ventricular internal cavity size was normal in size. There is mild left ventricular hypertrophy. Left ventricular diastolic parameters are indeterminate. Right Ventricle: The right ventricular size is not well visualized. No increase in right ventricular wall thickness. Right ventricular systolic function is normal. Left Atrium: Left atrial size was normal in size. Right Atrium: Right atrial size was not well visualized. Pericardium: There is no evidence of pericardial effusion. Mitral Valve: The mitral valve is grossly normal. No evidence of mitral valve regurgitation. MV peak gradient, 4.8 mmHg. The mean mitral valve gradient is 2.0 mmHg. Tricuspid Valve: The tricuspid valve is not well visualized. Tricuspid valve regurgitation is not demonstrated. Aortic Valve: The aortic valve was not well visualized. Aortic valve regurgitation is mild. Aortic valve mean gradient measures 1.0 mmHg. Aortic valve peak gradient measures 2.9 mmHg. Aortic valve area, by VTI measures 2.72 cm. Pulmonic Valve: The pulmonic valve was not well visualized. Pulmonic valve regurgitation is not visualized. Aorta: Aortic dilatation noted. There is mild dilatation of the aortic root, measuring 43 mm. Venous: The inferior vena cava was not well visualized. IAS/Shunts: No atrial level shunt detected by color flow Doppler. Agitated saline  contrast was given intravenously to evaluate for intracardiac shunting. Saline contrast study was suboptimal.  LEFT VENTRICLE PLAX 2D LVIDd:         4.40 cm LVIDs:         3.00 cm LV PW:         1.70 cm LV IVS:        1.30 cm LVOT diam:     2.00 cm LV SV:         31 LV SV Index:   13 LVOT Area:     3.14 cm  RIGHT VENTRICLE RV S prime:     11.30 cm/s TAPSE (M-mode): 3.6 cm LEFT ATRIUM             Index        RIGHT ATRIUM           Index LA diam:        4.50 cm 1.84 cm/m   RA Area:     30.90 cm LA Vol (A2C):   61.6 ml 25.16 ml/m  RA Volume:   118.00 ml 48.20 ml/m LA Vol (A4C):   41.9 ml 17.12 ml/m LA Biplane Vol: 50.7 ml 20.71 ml/m  AORTIC VALVE                    PULMONIC VALVE AV Area (Vmax):    2.30 cm     PV Vmax:        0.72 m/s AV Area (Vmean):   2.43 cm     PV Vmean:       51.200 cm/s AV Area (VTI):     2.72 cm     PV VTI:         0.143 m AV Vmax:           84.60 cm/s   PV Peak grad:   2.1 mmHg AV Vmean:          52.200 cm/s  Mean grad:   1.0 mmHg AV VTI:            0.113 m      RVOT Peak grad: 3 mmHg AV Peak Grad:      2.9 mmHg AV Mean Grad:      1.0 mmHg LVOT Vmax:         62.00 cm/s LVOT Vmean:        40.300 cm/s LVOT VTI:          0.098 m LVOT/AV VTI ratio: 0.87  AORTA Ao Root diam: 4.30 cm MITRAL VALVE               TRICUSPID VALVE MV Area (PHT): 5.38 cm²    TR Peak grad:   7.2 mmHg MV Area VTI:   2.48 cm²    TR Vmax:        134.00 cm/s MV Peak grad:  4.8 mmHg MV Mean grad:  2.0 mmHg    SHUNTS MV Vmax:       1.09 m/s    Systemic VTI:  0.10 m MV Vmean:      66.2 cm/s   Systemic Diam: 2.00 cm MV Decel Time: 141 msec    Pulmonic VTI:  0.112 m MV E velocity: 77.10 cm/s Brian Agbor-Etang MD Electronically signed by Brian Agbor-Etang MD Signature Date/Time: 02/20/2021/7:08:45 PM    Final    ° °Scheduled Meds: ° aspirin EC  81 mg Oral Daily  ° atenolol  12.5 mg Oral BID  ° febuxostat  40 mg Oral Daily  ° guaiFENesin  600 mg Oral BID  ° loratadine  10 mg Oral Daily  ° rivaroxaban  20 mg Oral Daily  °  sodium chloride flush  3 mL Intravenous Q12H  ° °Continuous Infusions: ° sodium chloride    ° remdesivir 100 mg in NS 100 mL 100 mg (02/21/21 1106)  ° ° ° LOS: 4 days  ° °Time spent: 36 minutes. °More than 50% of the time was spent in counseling/coordination of care ° °Sumayya Amin, MD °[facility] ° °If 7PM-7AM, please contact night-coverage °[email or web address] ° °02/21/2021, 12:26 PM  ° °This record has been created using Dragon voice recognition software. Errors have been sought and corrected,but may not always be located. Such creation errors do not reflect on the standard of care.   °

## 2021-02-21 NOTE — TOC Progression Note (Signed)
Transition of Care [facility]) - Progression Note    Patient Details  Name: Nicholas [name] MRN: [id number] Date of Birth: Aug 03, 1945  Transition of Care [facility]) CM/SW [place], RN Phone Number: 02/21/2021, 2:41 PM  Clinical Narrative:   Spoke to daughter.  Wife has health issues and cannot care for patient at home with his currently level of functioning.  Daughter and spouse have chosen [facility] in [place] as their preferred SNF for patient  Accepted in hub and [facility] sent to [facility]  Due to COVID + status, patient is eligible for transfer 28 Feb 2021.  Patient and family aware of quarantine status.  TOC contact information provided, TOC to follow to discharge.    Expected Discharge Plan: [place] Barriers to Discharge: Continued Medical Work up  Expected Discharge Plan and Services Expected Discharge Plan: [place]   Discharge Planning Services: CM Consult   Living arrangements for the past 2 months: Single Family Home                                       Social Determinants of Health (SDOH) Interventions    Readmission Risk Interventions No flowsheet data found.

## 2021-02-22 MED ORDER — SALINE SPRAY 0.65 % NA SOLN
1.0000 | NASAL | Status: DC | PRN
  Filled 2021-02-22: qty 44

## 2021-02-22 NOTE — TOC Progression Note (Signed)
Transition of Care [facility]) - Progression Note    Patient Details  Name: Nicholas [name] MRN: [id number] Date of Birth: 12-26-1945  Transition of Care [facility]) CM/SW [place], RN Phone Number: 02/22/2021, 3:48 PM  Clinical Narrative:   Patient walking and HH is recommended on discharge.  Wife has [place] and is to ill to pick patient up.    Expected Discharge Plan: [place] Barriers to Discharge: Continued Medical Work up  Expected Discharge Plan and Services Expected Discharge Plan: [place]   Discharge Planning Services: CM Consult   Living arrangements for the past 2 months: Single Family Home                                       Social Determinants of Health (SDOH) Interventions    Readmission Risk Interventions No flowsheet data found.

## 2021-02-22 NOTE — Progress Notes (Signed)
PROGRESS NOTE    Nicholas [name]  IWL:[id number] DOB: 05-08-1945 DOA: 02/17/2021 PCP: Rusty Aus, MD   Brief Narrative: Taken from prior notes. Nicholas [name] is a 75 y.o. male with medical history significant for atrial fibrillation, history of pulmonary embolism, TIA, gout who presents to the ER for evaluation of weakness status post fall. Found to have a positive PCR for COVID-19. Imaging without any acute abnormality.  CT abdomen with bilateral renal cyst without any significant change.  Left-sided nephrolithiasis without obstruction. He was started on remdesivir. PT is recommending SNF-patient need to wait 10 days after positive COVID test.  Tested positive on 02/17/2021-should be able to go on 02/28/2021 Also found to have elevated CK level due to rhabdomyolysis secondary to fall.  CK level started trending down.  12/16: Patient continued to improve physically, he was able to participate with PT very well, able to climb up stairs and they changed the recommendations from SNF to home health services.  Patient himself wants to go home.  There was a difficult family dynamic and having a lot of social issues and pushed back.  TOC is working on it.  Patient is medically stable for discharge but apparently family wants him to go to rehab.  Wife is sick with COVID also and also has parkinsonism.  Subjective: Patient was seen and examined today.  No new complaints.  He was sitting in chair eating his breakfast.  He wants to go home, stating that today is his wife's birthday.  He was happy that he was able to climb stairs with PT and excited to return home instead of rehab.  Assessment & Plan:   Principal Problem:   COVID-19 virus infection Active Problems:   A-fib ([place])   TIA (transient ischemic attack)   Lactic acidosis   Hyponatremia   Hypotension   Generalized weakness  COVID-19 viral infection.  Tested positive on 02/17/2021.  Received 2 doses of vaccine, no  booster. Currently on room air.  Chest x-ray without any sign of pneumonia. Initially met sepsis criteria with tachypnea and lactic acidosis.  Lactic acidosis has been resolved. He was started on remdesivir-day 5-completed the course today -Continue with supportive care and supplement.  Rhabdomyolysis.  Resolved Secondary to fall.  CK level started trending down.  IV fluid was discontinued  Generalized weakness/falls.  PT initially recommended SNF, patient continued to improve physically while waiting for SNF and now they changed recommendations to home with home health. Wife and daughter are very reluctant-TOC to work on disposition. -Patient has to wait 10 days due to positive COVID PCR, should be able to go on 01/29/2021. -TOC to find placement  Permanent atrial fibrillation.  Home dose of atenolol was initially held due to hypotension.  Currently rate well controlled. -Restarted home atenolol -Continue with Xarelto  Lower extremity edema.  No prior diagnosis of heart failure.  Mildly elevated BNP at 385. -Echocardiogram with normal EF, no regional wall motion abnormalities and indeterminate diastolic function. -Continue to monitor  Objective: Vitals:   02/22/21 0043 02/22/21 0548 02/22/21 0735 02/22/21 1143  BP: 115/73 131/76 121/83 104/74  Pulse: 76 88 81 76  Resp: 19 18 20 18   Temp: 98.3 F (36.8 C) 98.3 F (36.8 C) 98 F (36.7 C) 97.7 F (36.5 C)  TempSrc:      SpO2: 94% 96% 97% 93%  Weight:      Height:        Intake/Output Summary (Last 24 hours) at 02/22/2021 1620  Last data filed at 02/22/2021 1478 Gross per 24 hour  Intake --  Output 2250 ml  Net -2250 ml    Filed Weights   02/17/21 0748  Weight: 114.8 kg    Examination:  General.  Well-developed elderly man, in no acute distress. Pulmonary.  Lungs clear bilaterally, normal respiratory effort. CV.  Regular rate and rhythm, no JVD, rub or murmur. Abdomen.  Soft, nontender, nondistended, BS  positive. CNS.  Alert and oriented.  No focal neurologic deficit. Extremities.  No edema, no cyanosis, pulses intact and symmetrical. Psychiatry.  Judgment and insight appears normal.   DVT prophylaxis: Xarelto Code Status: Full Family Communication:  Disposition Plan:  Status is: Inpatient  Remains inpatient appropriate because: Severity of illness   Level of care: Telemetry Cardiac  All the records are reviewed and case discussed with Care Management/Social Worker. Management plans discussed with the patient, nursing and they are in agreement.  Consultants:  None  Procedures:  Antimicrobials:   Data Reviewed: I have personally reviewed following labs and imaging studies  CBC: Recent Labs  Lab 02/17/21 0755 02/18/21 0440 02/19/21 0608 02/20/21 0621  WBC 7.4 5.5 4.4 5.5  NEUTROABS 5.8  --   --   --   HGB 13.9 13.3 13.6 14.3  HCT 41.7 39.2 40.1 41.7  MCV 96.3 95.8 94.1 92.3  PLT 202 156 153 295    Basic Metabolic Panel: Recent Labs  Lab 02/17/21 0755 02/18/21 0440 02/19/21 0608 02/20/21 0621  NA 133* 130* 133* 135  K 3.7 3.9 3.4* 3.6  CL 104 103 107 105  CO2 19* 21* 20* 24  GLUCOSE 88 99 113* 91  BUN 13 17 13 14   CREATININE 1.07 1.07 1.00 0.94  CALCIUM 8.7* 8.4* 8.1* 8.3*  MG  --  1.9 1.8  --   PHOS  --  3.5 3.6  --     GFR: Estimated Creatinine Clearance: 92.7 mL/min (by C-G formula based on SCr of 0.94 mg/dL). Liver Function Tests: Recent Labs  Lab 02/17/21 0755 02/20/21 0621  AST 40 74*  ALT 19 34  ALKPHOS 48 39  BILITOT 0.8 0.8  PROT 5.9* 5.7*  ALBUMIN 3.7 3.2*    No results for input(s): LIPASE, AMYLASE in the last 168 hours. No results for input(s): AMMONIA in the last 168 hours. Coagulation Profile: No results for input(s): INR, PROTIME in the last 168 hours. Cardiac Enzymes: Recent Labs  Lab 02/18/21 0440 02/19/21 0608 02/20/21 0621 02/21/21 0614  CKTOTAL 981* 1,385* 872* 481*    BNP (last 3 results) No results for  input(s): PROBNP in the last 8760 hours. HbA1C: No results for input(s): HGBA1C in the last 72 hours. CBG: No results for input(s): GLUCAP in the last 168 hours. Lipid Profile: No results for input(s): CHOL, HDL, LDLCALC, TRIG, CHOLHDL, LDLDIRECT in the last 72 hours. Thyroid Function Tests: No results for input(s): TSH, T4TOTAL, FREET4, T3FREE, THYROIDAB in the last 72 hours. Anemia Panel: No results for input(s): VITAMINB12, FOLATE, FERRITIN, TIBC, IRON, RETICCTPCT in the last 72 hours.  Sepsis Labs: Recent Labs  Lab 02/17/21 0755 02/17/21 0946 02/19/21 0608  PROCALCITON <0.10  --   --   LATICACIDVEN 3.4* 2.0* 1.2     Recent Results (from the past 240 hour(s))  Resp Panel by RT-PCR (Flu A&B, Covid) Nasopharyngeal Swab     Status: Abnormal   Collection Time: 02/17/21  7:55 AM   Specimen: Nasopharyngeal Swab; Nasopharyngeal(NP) swabs in vial transport medium  Result Value Ref  Range Status   SARS Coronavirus 2 by RT PCR POSITIVE (A) NEGATIVE Final    Comment: RESULT CALLED TO, READ BACK BY AND VERIFIED WITH: ANTACIA BELL @0914  02/17/21 MJU (NOTE) SARS-CoV-2 target nucleic acids are DETECTED.  The SARS-CoV-2 RNA is generally detectable in upper respiratory specimens during the acute phase of infection. Positive results are indicative of the presence of the identified virus, but do not rule out bacterial infection or co-infection with other pathogens not detected by the test. Clinical correlation with patient history and other diagnostic information is necessary to determine patient infection status. The expected result is Negative.  Fact Sheet for Patients: EntrepreneurPulse.com.au  Fact Sheet for Healthcare Providers: IncredibleEmployment.be  This test is not yet approved or cleared by the [place] FDA and  has been authorized for detection and/or diagnosis of SARS-CoV-2 by FDA under an Emergency Use Authorization (EUA).  This  EUA will remain in effect (meaning this test can be u sed) for the duration of  the COVID-19 declaration under Section 564(b)(1) of the Act, 21 U.S.C. section 360bbb-3(b)(1), unless the authorization is terminated or revoked sooner.     Influenza A by PCR NEGATIVE NEGATIVE Final   Influenza B by PCR NEGATIVE NEGATIVE Final    Comment: (NOTE) The Xpert Xpress SARS-CoV-2/FLU/RSV plus assay is intended as an aid in the diagnosis of influenza from Nasopharyngeal swab specimens and should not be used as a sole basis for treatment. Nasal washings and aspirates are unacceptable for Xpert Xpress SARS-CoV-2/FLU/RSV testing.  Fact Sheet for Patients: EntrepreneurPulse.com.au  Fact Sheet for Healthcare Providers: IncredibleEmployment.be  This test is not yet approved or cleared by the [place] FDA and has been authorized for detection and/or diagnosis of SARS-CoV-2 by FDA under an Emergency Use Authorization (EUA). This EUA will remain in effect (meaning this test can be used) for the duration of the COVID-19 declaration under Section 564(b)(1) of the Act, 21 U.S.C. section 360bbb-3(b)(1), unless the authorization is terminated or revoked.  Performed at [facility], [place], [place] [address]   Group A Strep by PCR [facility] Only)     Status: None   Collection Time: 02/17/21  7:55 AM   Specimen: Nasopharyngeal Swab; Sterile Swab  Result Value Ref Range Status   Group A Strep by PCR NOT DETECTED NOT DETECTED Final    Comment: Performed at [facility], [address], [place], [place] [address]      Radiology Studies: No results found.  Scheduled Meds:  aspirin EC  81 mg Oral Daily   atenolol  12.5 mg Oral BID   febuxostat  40 mg Oral Daily   guaiFENesin  600 mg Oral BID   loratadine  10 mg Oral Daily   rivaroxaban  20 mg Oral Daily   sodium chloride flush  3 mL Intravenous Q12H   Continuous  Infusions:  sodium chloride       LOS: 5 days   Time spent: 40 minutes. More than 50% of the time was spent in counseling/coordination of care  Lorella Nimrod, MD [facility]  If 7PM-7AM, please contact night-coverage [email or web address]  02/22/2021, 4:20 PM   This record has been created using Systems analyst. Errors have been sought and corrected,but may not always be located. Such creation errors do not reflect on the standard of care.

## 2021-02-22 NOTE — Progress Notes (Signed)
Physical Therapy Treatment Patient Details Name: Nicholas [name] MRN: [id number] DOB: [birth year] Today's Date: 02/22/2021   History of Present Illness Patient is a 75 y.o. male with medical history significant for atrial fibrillation, history of pulmonary embolism, TIA, gout who presents to the ER for evaluation of weakness status post fall. Found to be COVID positive.    PT Comments    Pt in bed, room ,dark, still sleepy but awake. No physical assist provided in session. Elevated surface height for transfers, but not as high as previous days. Cues for improved control with walker, he succeeds. BRW for width also helpful today. Pt performs 12 stairs in stairwell cautiously with success. Pt left up in recliner, RN in room, food being set up. Gait speed today up 4%. Self selected gait speed at baseline 0.57m/s in august, 0.52 today.    Recommendations for follow up therapy are one component of a multi-disciplinary discharge planning process, led by the attending physician.  Recommendations may be updated based on patient status, additional functional criteria and insurance authorization.  Follow Up Recommendations  Home health PT     Assistance Recommended at Discharge Set up Supervision/Assistance  Equipment Recommendations  Rolling walker (2 wheels);BSC/3in1    Recommendations for Other Services       Precautions / Restrictions Precautions Precautions: Fall Restrictions Weight Bearing Restrictions: No     Mobility  Bed Mobility Overal bed mobility: Modified Independent             General bed mobility comments: modI with problem solving and effort required    Transfers Overall transfer level: Needs assistance Equipment used: Rolling walker (2 wheels) Transfers: Sit to/from Stand Sit to Stand: Supervision;From elevated surface           General transfer comment: BRW for width, heavy effort, good technique without cues    Ambulation/Gait   Gait Distance  (Feet): 480 Feet Assistive device:  (without device for 133ft, BRW for first portion to warmup;)   Gait velocity: 0.67m/s (0.56m/s previous day).         Stairs Stairs: Yes Stairs assistance: Min guard Stair Management: Two rails;Alternating pattern;Forwards Number of Stairs: 12 General stair comments: reports confidence in home entry of 5 stairs in garage   Wheelchair Mobility    Modified Rankin (Stroke Patients Only)       Balance Overall balance assessment:  (no LOB this session; 2 LOB previous day.)                                          Cognition Arousal/Alertness: Awake/alert Behavior During Therapy: WFL for tasks assessed/performed Overall Cognitive Status: No family/caregiver present to determine baseline cognitive functioning                                 General Comments: Pt needing cuing for redirection a few times, not too different from baseline; sleepy upon entry        Exercises General Exercises - Lower Extremity Heel Slides: AROM;Both;10 reps;Supine Hip Flexion/Marching: AROM;Both;10 reps;Supine    General Comments        Pertinent Vitals/Pain Pain Assessment: No/denies pain    Home Living                          Prior Function  PT Goals (current goals can now be found in the care plan section) Acute Rehab PT Goals Patient Stated Goal: to be able to walk PT Goal Formulation: With patient Time For Goal Achievement: 03/04/21 Potential to Achieve Goals: Fair Progress towards PT goals: Progressing toward goals    Frequency    Min 2X/week      PT Plan Discharge plan needs to be updated;Equipment recommendations need to be updated    Co-evaluation              AM-PAC PT "6 Clicks" Mobility   Outcome Measure  Help needed turning from your back to your side while in a flat bed without using bedrails?: A Little Help needed moving from lying on your back to sitting on  the side of a flat bed without using bedrails?: A Little Help needed moving to and from a bed to a chair (including a wheelchair)?: A Little Help needed standing up from a chair using your arms (e.g., wheelchair or bedside chair)?: A Lot Help needed to walk in hospital room?: A Little Help needed climbing 3-5 steps with a railing? : A Little 6 Click Score: 17    End of Session Equipment Utilized During Treatment: Gait belt Activity Tolerance: Patient tolerated treatment well;No increased pain Patient left: in chair;with nursing/sitter in room;with call bell/phone within reach Nurse Communication: Mobility status PT Visit Diagnosis: Unsteadiness on feet (R26.81);Muscle weakness (generalized) (M62.81)     Time: 6861-6837 PT Time Calculation (min) (ACUTE ONLY): 43 min  Charges:  $Gait Training: 8-22 mins $Therapeutic Exercise: 23-37 mins                10:05 AM, 02/22/21 [name], PT, DPT Physical Therapist - [facility]  [phone] ([place])     Nicholas [name] 02/22/2021, 9:58 AM

## 2021-02-22 NOTE — Progress Notes (Signed)
Occupational Therapy Treatment Patient Details Name: Nicholas [name] MRN: [id number] DOB: Jul 29, 1945 Today's Date: 02/22/2021   History of present illness Patient is a 75 y.o. male with medical history significant for atrial fibrillation, history of pulmonary embolism, TIA, gout who presents to the ER for evaluation of weakness status post fall. Found to be COVID positive.   OT comments  Pt seen for OT tx this date. He continues to have some lack of insight into deficits and decreased safety awareness. While his mobility and strength are improving, he did demo two near LOB instance with fxl mobility on the unit, requiring OT to correct each time for fall prevention. Pt able to CTS from regular bed height with bari RW with CGA/SUPV, but requires CGA for fxl mobility throughout unit d/t mild balance deficits and aforementioned decreased safety awareness. He requires MIN A for donning/doffing socks and shoes while seated EOB pre session and in chair post session. While seated in chair, OT engages pt in 1 set x10 reps chair/modified tricep push ups to increase UE strength for balance with AD. Pt tolerates well. Cues for pacing throughout session as he does not initiate rest breaks as needed although his RR can be heard increasing. Pt left in chair with all needs met and in reach end of session. Will continue to follow acutely. While he has progressed with mobility, continue to recommend STR at this time as pt is still a significant fall risk and now has even less support at home as his wife is reportedly ill now (per case mgt).    Recommendations for follow up therapy are one component of a multi-disciplinary discharge planning process, led by the attending physician.  Recommendations may be updated based on patient status, additional functional criteria and insurance authorization.    Follow Up Recommendations  Skilled nursing-short term rehab (<3 hours/day)    Assistance Recommended at Discharge  Frequent or constant Supervision/Assistance  Equipment Recommendations  Other (comment) (defer)    Recommendations for Other Services      Precautions / Restrictions Precautions Precautions: Fall Restrictions Weight Bearing Restrictions: No       Mobility Bed Mobility Overal bed mobility: Modified Independent                  Transfers Overall transfer level: Needs assistance Equipment used: Rolling walker (2 wheels) Transfers: Sit to/from Stand Sit to Stand: Supervision;Min guard           General transfer comment: BRW, rock to gain momentum from regular bed elevation     Balance Overall balance assessment: Needs assistance   Sitting balance-Leahy Scale: Good     Standing balance support: Bilateral upper extremity supported Standing balance-Leahy Scale: Poor Standing balance comment: UE support and 2 near LOB with fxl mobility requiring OT efforts to correct and prevent fall.                           ADL either performed or assessed with clinical judgement   ADL Overall ADL's : Needs assistance/impaired                     Lower Body Dressing: Minimal assistance;Sitting/lateral leans Lower Body Dressing Details (indicate cue type and reason): socks and shoes while seated EOB                    Extremity/Trunk Assessment  Vision       Perception     Praxis      Cognition Arousal/Alertness: Awake/alert Behavior During Therapy: WFL for tasks assessed/performed Overall Cognitive Status: No family/caregiver present to determine baseline cognitive functioning                                 General Comments: able to follow commands, basically oriented, poor safety awareness/lack of insight into deficits          Exercises Other Exercises Other Exercises: chair/modified tricep puch ups x10   Shoulder Instructions       General Comments      Pertinent Vitals/ Pain       Pain  Assessment: No/denies pain  Home Living                                          Prior Functioning/Environment              Frequency  Min 2X/week        Progress Toward Goals  OT Goals(current goals can now be found in the care plan section)  Progress towards OT goals: Progressing toward goals  Acute Rehab OT Goals Patient Stated Goal: to get better OT Goal Formulation: With patient Time For Goal Achievement: 03/04/21 Potential to Achieve Goals: Good  Plan Discharge plan remains appropriate;Frequency remains appropriate    Co-evaluation                 AM-PAC OT "6 Clicks" Daily Activity     Outcome Measure   Help from another person eating meals?: None Help from another person taking care of personal grooming?: A Little Help from another person toileting, which includes using toliet, bedpan, or urinal?: A Lot Help from another person bathing (including washing, rinsing, drying)?: A Lot Help from another person to put on and taking off regular upper body clothing?: A Little Help from another person to put on and taking off regular lower body clothing?: A Lot 6 Click Score: 16    End of Session Equipment Utilized During Treatment: Rolling walker (2 wheels)  OT Visit Diagnosis: Unsteadiness on feet (R26.81);Other abnormalities of gait and mobility (R26.89);Muscle weakness (generalized) (M62.81)   Activity Tolerance Patient tolerated treatment well   Patient Left with call bell/phone within reach;in chair   Nurse Communication Other (comment) (needs chair alarm, sent to Robert Bellow, via [facility] chat)        Time: 1610-9604 OT Time Calculation (min): 38 min  Charges: OT General Charges $OT Visit: 1 Visit OT Treatments $Self Care/Home Management : 8-22 mins $Therapeutic Activity: 8-22 mins $Neuromuscular Re-education: 8-22 mins  Gerrianne Scale, [place], OTR/L ascom [phone] 02/22/21, 6:14 PM

## 2021-02-23 MED ORDER — VITAMIN C 250 MG PO TABS: 250.0000 mg | ORAL_TABLET | Freq: Two times a day (BID) | ORAL | 2 refills | Status: AC

## 2021-02-23 MED ORDER — ZINC SULFATE 220 (50 ZN) MG PO CAPS: 220.0000 mg | ORAL_CAPSULE | Freq: Every day | ORAL | 0 refills | Status: AC

## 2021-02-23 MED ORDER — DM-GUAIFENESIN ER 30-600 MG PO TB12: 1.0000 | ORAL_TABLET | Freq: Two times a day (BID) | ORAL | 0 refills | Status: DC

## 2021-02-23 NOTE — TOC Progression Note (Signed)
Transition of Care [facility]) - Progression Note    Patient Details  Name: Nicholas [name] MRN: [id number] Date of Birth: [birth year]  [facility] CM/SW [place], RN Phone Number: 02/23/2021, 2:58 PM  Clinical Narrative:   Daughter stated that the patient doe snot have a RW at home it belongs to his wife, I called Jasmine at [facility] and one will be delivered to his room prior to DC    Expected Discharge Plan: Skilled Nursing Facility Barriers to Discharge: Continued Medical Work up  Expected Discharge Plan and Services Expected Discharge Plan: [place]   Discharge Planning Services: CM Consult   Living arrangements for the past 2 months: Single Family Home Expected Discharge Date: 02/23/21                                     Social Determinants of Health (SDOH) Interventions    Readmission Risk Interventions No flowsheet data found.

## 2021-02-23 NOTE — Discharge Summary (Signed)
Physician Discharge Summary  Nicholas [name] OFB:[id number] DOB: 1945-04-17 DOA: 02/17/2021  PCP: Rusty Aus, MD  Admit date: 02/17/2021 Discharge date: 02/23/2021  Admitted From: Home Disposition: Home  Recommendations for Outpatient Follow-up:  Follow up with PCP in 1-2 weeks Please obtain BMP/CBC in one week Please follow up on the following pending results: None  Home Health: Yes Equipment/Devices: Rolling walker Discharge Condition: Stable CODE STATUS: Full Diet recommendation: Heart Healthy   Brief/Interim Summary: Nicholas [name] is a 75 y.o. male with medical history significant for atrial fibrillation, history of pulmonary embolism, TIA, gout who presents to the ER for evaluation of weakness status post fall. Found to have a positive PCR for COVID-19. Imaging without any acute abnormality.  CT abdomen with bilateral renal cyst without any significant change.  Left-sided nephrolithiasis without obstruction. He was started on remdesivir and completed a 5-day course.  He was also found to have elevated CK secondary to rhabdomyolysis with his history of fall.  He received IV fluid with resulted improvement in CK levels.  Our physical therapist evaluated him and initial recommendations were to go to rehab.  While he was waiting to complete his quarantine before going to a rehab he continued to improve physically.  He was able to participate with physical therapy well, walking around and climbing stairs.  He wants to go home.  We arranged home health services as he has a sick wife at home with Parkinson's and also had COVID infection.  Patient was having some lower extremity edema and BNP was at 385.  We did echocardiogram which was within normal limit.  No acute abnormality noted.  Patient has an history of permanent atrial fibrillation.  He will continue home dose of atenolol and Xarelto.  Will continue with rest of his home medications and follow-up with his  providers.  Discharge Diagnoses:  Principal Problem:   COVID-19 virus infection Active Problems:   A-fib ([place])   TIA (transient ischemic attack)   Lactic acidosis   Hyponatremia   Hypotension   Generalized weakness   Discharge Instructions  Discharge Instructions     Diet - low sodium heart healthy   Complete by: As directed    Discharge instructions   Complete by: As directed    It was pleasure taking care of you. You are being given some Mucinex to be use as needed for cough. Continue taking your vitamin C and zinc supplement for couple of month. Keep yourself well-hydrated and follow-up with your primary care provider for further recommendations   Increase activity slowly   Complete by: As directed       Allergies as of 02/23/2021       Reactions   Penicillins Other (See Comments)   Allopurinol Hives        Medication List     TAKE these medications    acetaminophen 325 MG tablet Commonly known as: TYLENOL Take 650 mg by mouth every 6 (six) hours as needed.   aspirin EC 81 MG tablet Take 81 mg by mouth daily. Swallow whole.   atenolol 25 MG tablet Commonly known as: TENORMIN Take 12.5 mg by mouth 2 (two) times daily.   colchicine 0.6 MG tablet Take 0.6 mg by mouth as needed.   dextromethorphan-guaiFENesin 30-600 MG 12hr tablet Commonly known as: MUCINEX DM Take 1 tablet by mouth 2 (two) times daily.   febuxostat 40 MG tablet Commonly known as: ULORIC Take 40 mg by mouth daily.   hydrocortisone 2.5 %  cream Apply topically daily. Tuesday, Thursday, Saturday   ketoconazole 2 % cream Commonly known as: NIZORAL Apply 1 application topically daily. Monday, Wednesday, Friday   multivitamin-lutein Caps capsule Take 1 capsule by mouth daily.   vitamin C 250 MG tablet Commonly known as: ASCORBIC ACID Take 1 tablet (250 mg total) by mouth 2 (two) times daily.   Xarelto 20 MG Tabs tablet Generic drug: rivaroxaban Take 20 mg by mouth daily.    zinc sulfate 220 (50 Zn) MG capsule Commonly known as: Zinc-220 Take 1 capsule (220 mg total) by mouth daily.        Contact information for follow-up providers     Rusty Aus, MD. Schedule an appointment as soon as possible for a visit in 1 week(s).   Specialty: Internal Medicine Contact information: [place] [address] [phone]              Contact information for after-discharge care     Destination     [facility] Preferred SNF .   Service: Skilled Nursing Contact information: [place] [phone]                    Allergies  Allergen Reactions   Penicillins Other (See Comments)   Allopurinol Hives    Consultations: None  Procedures/Studies: CT ABDOMEN PELVIS W CONTRAST  Result Date: 02/17/2021 CLINICAL DATA:  Right-sided low back pain with hypotension and elevated lactate. Evaluate for cause of sepsis including renal obstruction or abscess. Fall this morning. EXAM: CT ABDOMEN AND PELVIS WITH CONTRAST TECHNIQUE: Multidetector CT imaging of the abdomen and pelvis was performed using the standard protocol following bolus administration of intravenous contrast. CONTRAST:  16mL OMNIPAQUE IOHEXOL 300 MG/ML  SOLN COMPARISON:  05/22/2020 FINDINGS: Lower chest: No acute findings in the lung bases. Mild stable cardiomegaly. Hepatobiliary: Liver, gallbladder and biliary tree are within normal. Pancreas: Normal. Spleen: Normal. Adrenals/Urinary Tract: Adrenal glands are normal. Kidneys are normal in size and demonstrate bilateral cysts without significant change. No hydronephrosis. A few stones over the mid to lower pole left kidney with the largest measuring 5 mm. Ureters and bladder are normal. Stomach/Bowel: Stomach and small bowel are normal. Previous appendectomy. Minimal diverticulosis of the colon. Vascular/Lymphatic: Mild calcified  plaque over the abdominal aorta which is normal in caliber. No adenopathy. Reproductive: Fiducial markers over the prostate which is unchanged. Other: No free fluid or focal inflammatory changes. Musculoskeletal: Degenerative changes of the spine. IMPRESSION: 1. No acute findings in the abdomen/pelvis. 2. Bilateral renal cysts without significant change. Left-sided nephrolithiasis without obstruction. 3. Minimal colonic diverticulosis. 4. Aortic atherosclerosis. Aortic Atherosclerosis (ICD10-I70.0). Electronically Signed   By: Marin Olp M.D.   On: 02/17/2021 11:28   DG Chest Portable 1 View  Result Date: 02/17/2021 CLINICAL DATA:  Fall this morning with generalized weakness.  Cough. EXAM: PORTABLE CHEST 1 VIEW COMPARISON:  None. FINDINGS: Lungs are adequately inflated without focal airspace consolidation or effusion. No pneumothorax. Cardiomediastinal silhouette is within normal. No acute fracture. Degenerative change of the spine. IMPRESSION: No acute findings. Electronically Signed   By: Marin Olp M.D.   On: 02/17/2021 08:19   ECHOCARDIOGRAM COMPLETE BUBBLE STUDY  Result Date: 02/20/2021    ECHOCARDIOGRAM REPORT   Patient Name:   Nicholas [name] Date of Exam: 02/20/2021 Medical Rec #:  [id number]        Height:       76.0  in Accession #:    [id number]       Weight:       253.0 lb Date of Birth:  [birth year]       BSA:          2.448 m Patient Age:    25 years         BP:           119/81 mmHg Patient Gender: M                HR:           86 bpm. Exam Location:  [facility] Procedure: 2D Echo, Color Doppler, Cardiac Doppler and Saline Contrast Bubble            Study Indications:     Syncope 780.2 / R55  History:         Patient has no prior history of Echocardiogram examinations.                  TIA; Arrythmias:Atrial Fibrillation. PE.  Sonographer:     Sherrie Sport Referring Phys:  [id number] Val Riles Diagnosing Phys: Kate Sable MD  Sonographer Comments: Technically difficult study due to poor  echo windows. IMPRESSIONS  1. Left ventricular ejection fraction, by estimation, is 55%. The left ventricle has normal function. The left ventricle has no regional wall motion abnormalities. There is mild left ventricular hypertrophy. Left ventricular diastolic parameters are indeterminate.  2. Right ventricular systolic function is normal. The right ventricular size is not well visualized.  3. The mitral valve is grossly normal. No evidence of mitral valve regurgitation.  4. The aortic valve was not well visualized. Aortic valve regurgitation is mild.  5. Aortic dilatation noted. There is mild dilatation of the aortic root, measuring 43 mm.  6. Saline contrast study was suboptimal. FINDINGS  Left Ventricle: Left ventricular ejection fraction, by estimation, is 55%. The left ventricle has normal function. The left ventricle has no regional wall motion abnormalities. The left ventricular internal cavity size was normal in size. There is mild left ventricular hypertrophy. Left ventricular diastolic parameters are indeterminate. Right Ventricle: The right ventricular size is not well visualized. No increase in right ventricular wall thickness. Right ventricular systolic function is normal. Left Atrium: Left atrial size was normal in size. Right Atrium: Right atrial size was not well visualized. Pericardium: There is no evidence of pericardial effusion. Mitral Valve: The mitral valve is grossly normal. No evidence of mitral valve regurgitation. MV peak gradient, 4.8 mmHg. The mean mitral valve gradient is 2.0 mmHg. Tricuspid Valve: The tricuspid valve is not well visualized. Tricuspid valve regurgitation is not demonstrated. Aortic Valve: The aortic valve was not well visualized. Aortic valve regurgitation is mild. Aortic valve mean gradient measures 1.0 mmHg. Aortic valve peak gradient measures 2.9 mmHg. Aortic valve area, by VTI measures 2.72 cm. Pulmonic Valve: The pulmonic valve was not well visualized. Pulmonic  valve regurgitation is not visualized. Aorta: Aortic dilatation noted. There is mild dilatation of the aortic root, measuring 43 mm. Venous: The inferior vena cava was not well visualized. IAS/Shunts: No atrial level shunt detected by color flow Doppler. Agitated saline contrast was given intravenously to evaluate for intracardiac shunting. Saline contrast study was suboptimal.  LEFT VENTRICLE PLAX 2D LVIDd:         4.40 cm LVIDs:         3.00 cm LV PW:         1.70 cm LV IVS:  1.30 cm LVOT diam:     2.00 cm LV SV:         31 LV SV Index:   13 LVOT Area:     3.14 cm  RIGHT VENTRICLE RV S prime:     11.30 cm/s TAPSE (M-mode): 3.6 cm LEFT ATRIUM             Index        RIGHT ATRIUM           Index LA diam:        4.50 cm 1.84 cm/m   RA Area:     30.90 cm LA Vol (A2C):   61.6 ml 25.16 ml/m  RA Volume:   118.00 ml 48.20 ml/m LA Vol (A4C):   41.9 ml 17.12 ml/m LA Biplane Vol: 50.7 ml 20.71 ml/m  AORTIC VALVE                    PULMONIC VALVE AV Area (Vmax):    2.30 cm     PV Vmax:        0.72 m/s AV Area (Vmean):   2.43 cm     PV Vmean:       51.200 cm/s AV Area (VTI):     2.72 cm     PV VTI:         0.143 m AV Vmax:           84.60 cm/s   PV Peak grad:   2.1 mmHg AV Vmean:          52.200 cm/s  PV Mean grad:   1.0 mmHg AV VTI:            0.113 m      RVOT Peak grad: 3 mmHg AV Peak Grad:      2.9 mmHg AV Mean Grad:      1.0 mmHg LVOT Vmax:         62.00 cm/s LVOT Vmean:        40.300 cm/s LVOT VTI:          0.098 m LVOT/AV VTI ratio: 0.87  AORTA Ao Root diam: 4.30 cm MITRAL VALVE               TRICUSPID VALVE MV Area (PHT): 5.38 cm    TR Peak grad:   7.2 mmHg MV Area VTI:   2.48 cm    TR Vmax:        134.00 cm/s MV Peak grad:  4.8 mmHg MV Mean grad:  2.0 mmHg    SHUNTS MV Vmax:       1.09 m/s    Systemic VTI:  0.10 m MV Vmean:      66.2 cm/s   Systemic Diam: 2.00 cm MV Decel Time: 141 msec    Pulmonic VTI:  0.112 m MV E velocity: 77.10 cm/s Kate Sable MD Electronically signed by Kate Sable MD Signature Date/Time: 02/20/2021/7:08:45 PM    Final     Subjective: Patient was seen and examined today.  No new complaints.  He was confused about getting mixed messages regarding discharging home.  He wants to go home, stating that he is fully capable of taking care of himself and will not be a burden on his wife, rather he will be an assist. He was confused why her daughter is pushing for rehab.  He was very clear that he wants to go home.  Discharge Exam: Vitals:   02/23/21 0819 02/23/21 1156  BP: 113/82 115/82  Pulse: 81 70  Resp: 20 20  Temp: (!) 97.5 F (36.4 C) (!) 97.5 F (36.4 C)  SpO2: 98% 98%   Vitals:   02/23/21 0035 02/23/21 0427 02/23/21 0819 02/23/21 1156  BP: 122/81 129/83 113/82 115/82  Pulse: 80 80 81 70  Resp: 18 18 20 20   Temp: 98.9 F (37.2 C) 97.8 F (36.6 C) (!) 97.5 F (36.4 C) (!) 97.5 F (36.4 C)  TempSrc: Oral Oral Oral Oral  SpO2: 95% 97% 98% 98%  Weight:      Height:        General: Pt is alert, awake, not in acute distress Cardiovascular: RRR, S1/S2 +, no rubs, no gallops Respiratory: CTA bilaterally, no wheezing, no rhonchi Abdominal: Soft, NT, ND, bowel sounds + Extremities: no edema, no cyanosis   The results of significant diagnostics from this hospitalization (including imaging, microbiology, ancillary and laboratory) are listed below for reference.    Microbiology: Recent Results (from the past 240 hour(s))  Resp Panel by RT-PCR (Flu A&B, Covid) Nasopharyngeal Swab     Status: Abnormal   Collection Time: 02/17/21  7:55 AM   Specimen: Nasopharyngeal Swab; Nasopharyngeal(NP) swabs in vial transport medium  Result Value Ref Range Status   SARS Coronavirus 2 by RT PCR POSITIVE (A) NEGATIVE Final    Comment: RESULT CALLED TO, READ BACK BY AND VERIFIED WITH: ANTACIA BELL @0914  02/17/21 MJU (NOTE) SARS-CoV-2 target nucleic acids are DETECTED.  The SARS-CoV-2 RNA is generally detectable in upper respiratory specimens  during the acute phase of infection. Positive results are indicative of the presence of the identified virus, but do not rule out bacterial infection or co-infection with other pathogens not detected by the test. Clinical correlation with patient history and other diagnostic information is necessary to determine patient infection status. The expected result is Negative.  Fact Sheet for Patients: EntrepreneurPulse.com.au  Fact Sheet for Healthcare Providers: IncredibleEmployment.be  This test is not yet approved or cleared by the [place] FDA and  has been authorized for detection and/or diagnosis of SARS-CoV-2 by FDA under an Emergency Use Authorization (EUA).  This EUA will remain in effect (meaning this test can be u sed) for the duration of  the COVID-19 declaration under Section 564(b)(1) of the Act, 21 U.S.C. section 360bbb-3(b)(1), unless the authorization is terminated or revoked sooner.     Influenza A by PCR NEGATIVE NEGATIVE Final   Influenza B by PCR NEGATIVE NEGATIVE Final    Comment: (NOTE) The Xpert Xpress SARS-CoV-2/FLU/RSV plus assay is intended as an aid in the diagnosis of influenza from Nasopharyngeal swab specimens and should not be used as a sole basis for treatment. Nasal washings and aspirates are unacceptable for Xpert Xpress SARS-CoV-2/FLU/RSV testing.  Fact Sheet for Patients: EntrepreneurPulse.com.au  Fact Sheet for Healthcare Providers: IncredibleEmployment.be  This test is not yet approved or cleared by the [place] FDA and has been authorized for detection and/or diagnosis of SARS-CoV-2 by FDA under an Emergency Use Authorization (EUA). This EUA will remain in effect (meaning this test can be used) for the duration of the COVID-19 declaration under Section 564(b)(1) of the Act, 21 U.S.C. section 360bbb-3(b)(1), unless the authorization is terminated  or revoked.  Performed at [facility], [place], [place] [address]   Group A Strep by PCR [facility] Only)     Status: None   Collection Time: 02/17/21  7:55 AM   Specimen: Nasopharyngeal Swab; Sterile Swab  Result Value Ref  Range Status   Group A Strep by PCR NOT DETECTED NOT DETECTED Final    Comment: Performed at [facility], [address] [place], [place] [address]     Labs: BNP (last 3 results) Recent Labs    02/17/21 0755  BNP 481.8*   Basic Metabolic Panel: Recent Labs  Lab 02/17/21 0755 02/18/21 0440 02/19/21 0608 02/20/21 0621  NA 133* 130* 133* 135  K 3.7 3.9 3.4* 3.6  CL 104 103 107 105  CO2 19* 21* 20* 24  GLUCOSE 88 99 113* 91  BUN 13 17 13 14   CREATININE 1.07 1.07 1.00 0.94  CALCIUM 8.7* 8.4* 8.1* 8.3*  MG  --  1.9 1.8  --   PHOS  --  3.5 3.6  --    Liver Function Tests: Recent Labs  Lab 02/17/21 0755 02/20/21 0621  AST 40 74*  ALT 19 34  ALKPHOS 48 39  BILITOT 0.8 0.8  PROT 5.9* 5.7*  ALBUMIN 3.7 3.2*   No results for input(s): LIPASE, AMYLASE in the last 168 hours. No results for input(s): AMMONIA in the last 168 hours. CBC: Recent Labs  Lab 02/17/21 0755 02/18/21 0440 02/19/21 0608 02/20/21 0621  WBC 7.4 5.5 4.4 5.5  NEUTROABS 5.8  --   --   --   HGB 13.9 13.3 13.6 14.3  HCT 41.7 39.2 40.1 41.7  MCV 96.3 95.8 94.1 92.3  PLT 202 156 153 155   Cardiac Enzymes: Recent Labs  Lab 02/18/21 0440 02/19/21 0608 02/20/21 0621 02/21/21 0614  CKTOTAL 981* 1,385* 872* 481*   BNP: Invalid input(s): POCBNP CBG: No results for input(s): GLUCAP in the last 168 hours. D-Dimer No results for input(s): DDIMER in the last 72 hours. Hgb A1c No results for input(s): HGBA1C in the last 72 hours. Lipid Profile No results for input(s): CHOL, HDL, LDLCALC, TRIG, CHOLHDL, LDLDIRECT in the last 72 hours. Thyroid function studies No results for input(s): TSH, T4TOTAL, T3FREE, THYROIDAB in the last 72  hours.  Invalid input(s): FREET3 Anemia work up No results for input(s): VITAMINB12, FOLATE, FERRITIN, TIBC, IRON, RETICCTPCT in the last 72 hours. Urinalysis    Component Value Date/Time   COLORURINE YELLOW (A) 02/17/2021 0946   APPEARANCEUR CLEAR (A) 02/17/2021 0946   APPEARANCEUR Hazy (A) 04/26/2020 1509   LABSPEC >1.046 (H) 02/17/2021 0946   PHURINE 5.0 02/17/2021 0946   GLUCOSEU NEGATIVE 02/17/2021 0946   HGBUR SMALL (A) 02/17/2021 0946   BILIRUBINUR NEGATIVE 02/17/2021 0946   BILIRUBINUR Negative 04/26/2020 1509   KETONESUR NEGATIVE 02/17/2021 0946   PROTEINUR NEGATIVE 02/17/2021 0946   NITRITE NEGATIVE 02/17/2021 0946   LEUKOCYTESUR NEGATIVE 02/17/2021 0946   Sepsis Labs Invalid input(s): PROCALCITONIN,  WBC,  LACTICIDVEN Microbiology Recent Results (from the past 240 hour(s))  Resp Panel by RT-PCR (Flu A&B, Covid) Nasopharyngeal Swab     Status: Abnormal   Collection Time: 02/17/21  7:55 AM   Specimen: Nasopharyngeal Swab; Nasopharyngeal(NP) swabs in vial transport medium  Result Value Ref Range Status   SARS Coronavirus 2 by RT PCR POSITIVE (A) NEGATIVE Final    Comment: RESULT CALLED TO, READ BACK BY AND VERIFIED WITH: ANTACIA BELL @0914  02/17/21 MJU (NOTE) SARS-CoV-2 target nucleic acids are DETECTED.  The SARS-CoV-2 RNA is generally detectable in upper respiratory specimens during the acute phase of infection. Positive results are indicative of the presence of the identified virus, but do not rule out bacterial infection or co-infection with other pathogens not detected by the test. Clinical  correlation with patient history and other diagnostic information is necessary to determine patient infection status. The expected result is Negative.  Fact Sheet for Patients: EntrepreneurPulse.com.au  Fact Sheet for Healthcare Providers: IncredibleEmployment.be  This test is not yet approved or cleared by the [place] FDA and   has been authorized for detection and/or diagnosis of SARS-CoV-2 by FDA under an Emergency Use Authorization (EUA).  This EUA will remain in effect (meaning this test can be u sed) for the duration of  the COVID-19 declaration under Section 564(b)(1) of the Act, 21 U.S.C. section 360bbb-3(b)(1), unless the authorization is terminated or revoked sooner.     Influenza A by PCR NEGATIVE NEGATIVE Final   Influenza B by PCR NEGATIVE NEGATIVE Final    Comment: (NOTE) The Xpert Xpress SARS-CoV-2/FLU/RSV plus assay is intended as an aid in the diagnosis of influenza from Nasopharyngeal swab specimens and should not be used as a sole basis for treatment. Nasal washings and aspirates are unacceptable for Xpert Xpress SARS-CoV-2/FLU/RSV testing.  Fact Sheet for Patients: EntrepreneurPulse.com.au  Fact Sheet for Healthcare Providers: IncredibleEmployment.be  This test is not yet approved or cleared by the [place] FDA and has been authorized for detection and/or diagnosis of SARS-CoV-2 by FDA under an Emergency Use Authorization (EUA). This EUA will remain in effect (meaning this test can be used) for the duration of the COVID-19 declaration under Section 564(b)(1) of the Act, 21 U.S.C. section 360bbb-3(b)(1), unless the authorization is terminated or revoked.  Performed at [facility], [place], [place] [address]   Group A Strep by PCR [facility] Only)     Status: None   Collection Time: 02/17/21  7:55 AM   Specimen: Nasopharyngeal Swab; Sterile Swab  Result Value Ref Range Status   Group A Strep by PCR NOT DETECTED NOT DETECTED Final    Comment: Performed at [facility], [address], [place], [place] [address]    Time coordinating discharge: Over 30 minutes  SIGNED:  Lorella Nimrod, MD  [facility] 02/23/2021, 12:40 PM  If 7PM-7AM, please contact night-coverage [email or web address]  This record has  been created using Systems analyst. Errors have been sought and corrected,but may not always be located. Such creation errors do not reflect on the standard of care.

## 2021-02-23 NOTE — TOC Progression Note (Addendum)
Transition of Care [facility]) - Progression Note    Patient Details  Name: Nicholas [name] MRN: [id number] Date of Birth: 1945-07-20  Transition of Care [facility]) CM/SW [place], RN Phone Number: 02/23/2021, 12:27 PM  Clinical Narrative:   Spoke with the patient, He would like to have [facility] PT and does not have a preference of which company, He is set up with [facility] for [facility] , He needs a 3 in 1, will be delivered to room by [facility], spoke with Delana Meyer at [place] will get to the room     Expected Discharge Plan: [place] Barriers to Discharge: Continued Medical Work up  Expected Discharge Plan and Services Expected Discharge Plan: [place]   Discharge Planning Services: CM Consult   Living arrangements for the past 2 months: Single Family Home                                       Social Determinants of Health (SDOH) Interventions    Readmission Risk Interventions No flowsheet data found.

## 2021-02-23 NOTE — Progress Notes (Signed)
Patient has been discharged home.  Discharge papers given and explained to patient and son in law.  Both verbalized understanding.  Meds and f/u appointments reviewed.  Rx sent electronically to pharmacy.  Patient made aware.

## 2021-02-25 ENCOUNTER — Ambulatory Visit: Payer: Medicare Other

## 2021-02-28 ENCOUNTER — Ambulatory Visit: Payer: Medicare Other

## 2021-03-07 ENCOUNTER — Ambulatory Visit: Payer: Medicare Other

## 2021-03-14 ENCOUNTER — Other Ambulatory Visit: Payer: Self-pay

## 2021-03-14 ENCOUNTER — Ambulatory Visit: Payer: Medicare Other | Attending: Internal Medicine

## 2021-03-14 DIAGNOSIS — R2681 Unsteadiness on feet: Secondary | ICD-10-CM | POA: Diagnosis present

## 2021-03-14 DIAGNOSIS — R269 Unspecified abnormalities of gait and mobility: Secondary | ICD-10-CM | POA: Diagnosis present

## 2021-03-14 DIAGNOSIS — R262 Difficulty in walking, not elsewhere classified: Secondary | ICD-10-CM | POA: Diagnosis present

## 2021-03-14 DIAGNOSIS — R2689 Other abnormalities of gait and mobility: Secondary | ICD-10-CM | POA: Diagnosis present

## 2021-03-14 DIAGNOSIS — M6281 Muscle weakness (generalized): Secondary | ICD-10-CM | POA: Insufficient documentation

## 2021-03-14 DIAGNOSIS — M545 Low back pain, unspecified: Secondary | ICD-10-CM | POA: Insufficient documentation

## 2021-03-14 DIAGNOSIS — G8929 Other chronic pain: Secondary | ICD-10-CM | POA: Diagnosis present

## 2021-03-14 NOTE — Therapy (Signed)
[place] MAIN [facility] SERVICES [address] [place], [name], [address] Phone: [phone]   Fax:  [phone]  Physical Therapy Evaluation  Patient Details  Name: Nicholas [name] MRN: [id number] Date of Birth: 05-Jul-1945 Referring Provider (PT): Rusty Aus, MD   Encounter Date: 03/14/2021   PT End of Session - 03/14/21 1909     Visit Number 1    Number of Visits 25    Date for PT Re-Evaluation 06/06/21    Authorization Time Period 03/14/2021-06/06/2021    PT Start Time 1101    PT Stop Time 1145    PT Time Calculation (min) 44 min    Equipment Utilized During Treatment Gait belt    Activity Tolerance Patient tolerated treatment well    Behavior During Therapy [facility] for tasks assessed/performed             Past Medical History:  Diagnosis Date   A-fib ([place])    Cancer ([place]) 10/25/2019   Prostate   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    Subjective Assessment - 03/14/21 1057     Subjective Pt is known to PT and presents ambulating with QC. Pt is a 76 y/o male, returning to PT following admission to ED for evaluation of weakness s/p fall where pt was found to be COVID positive. Pt was in the hospital from 02/17/2021-02/23/2021. Pt was then d/c to home where he received [place] PT. Pt reports increased LBP since d/c from hospital and decreased balance since last in outpatient PT. While pt using QC currently, he reports he has been using a RW at home or grabbing onto the wall to steady himself while  walking. Pt reports he has been walking home distances.    Pertinent History Pt is a 76 y/o male, returning to PT following admission to ED for evaluation of weakness s/p fall where pt was found to be COVID positive. Pt was in the hospital from 02/17/2021-02/23/2021. Pt was then d/c to home where he received [place] PT. Pt reports increased LBP since d/c from hospital and decreased balance since last in outpatient PT. While pt using QC currently, he reports he has been using a RW at home or grabbing onto the wall to steady himself while walking. Pt reports he has been walking home distances. PMH significant for TIA, a-fib, pulmonary embolism, TIA, gout, rhabdomyolysis.    Limitations Standing;Walking;House hold activities;Lifting    How long can you sit comfortably? not limited    How long can you stand comfortably? pt reports limited due to feeling unsteady    How long can you walk comfortably? Pt reports he is currently walking household distances    Patient Stated Goals Pt would like to improve his walking and balance    Currently in Pain? Yes    Pain Location Back    Pain Orientation Lower    Pain Onset More than a month ago  SUBJECTIVE Chief complaint: LE weakness, decreased balance, decreased activity tolerance History: see subjective for details Referring Dx: leg weakness Referring Provider: Rusty Aus, MD Prior history of physical therapy for balance: yes, previously seen by author for back pain and balance  Falls in the last 6 months: Yes, 1 (fall that brought pt to ED where he was found to have COVID) Goals: improve balance, walking  Pain: Worst back pain in pat two weeks: 3-4/10.  Pt reports pain increases with activity.   OBJECTIVE:   Musculoskeletal Tremor: Absent Bulk: Normal   Posture Mild thoracic kyphosis, forward head posture in standing and in sitting. Flattening of curve of lumbar spine.   Gait Decreased gait speed and ability/functional  capacity (see 10MWT and 6MWT below). Pt brought QC to session and demonstrates increased variability in BOS when ambulating, a few uses of step-strategy to steady self/grabbing onto furniture. He requires CGA with majority of testing.  The pt is also observed to have decreased trunk rotation and hip extension with gait.    Strength R/L BLE strength grossly 4+/5, greatest deficit found in DF which was 4-/5 B   NEUROLOGICAL:   Sensation Deferred. 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 Pt has reported impaired sensation in his feet.     FUNCTIONAL OUTCOME MEASURES   Results Comments      DGI 15/24 Pt is at an increased risk for falls          5TSTS 12 seconds   6 Minute Walk Test 795 ft with QC Pain in hips reaches 2/10  10 Meter Gait Speed 0.67 m/s Self-selected: s = m/s; Fastest: s = m/s Below normative values for full community ambulation           FOTO: 58 (goal 59)   SLB: R 4 sec L 2 sec  POSTURAL CONTROL TESTS - DEFERRED   Modified Clinical Test of Sensory Interaction for Balance    (CTSIB):  CONDITION TIME STRATEGY SWAY  Eyes open, firm surface 30 seconds ankle   Eyes closed, firm surface 30 seconds ankle   Eyes open, foam surface 30 seconds ankle   Eyes closed, foam surface 30 seconds ankle      ASSESSMENT Clinical Impression: Pt is a pleasant 76 year-old male referred for LE weakness and difficulty with balance following COVID19 illness. PT examination reveals the following deficits and impairments: pain, decreased BLE strength, decreased balance, and impaired gait and functional capacity/endurance. The patient's performance on the DGI indicates he is at an increased risk for falls. The patient would benefit from skilled PT services to address these deficits in order to decrease fall risk and increase ease and safety with ADLs and to return the pt to PLOF.   PLAN Next Visit: initiate HEP, initiate strength, balance,  endurance and gait interventions HEP: to be initiated     [facility] PT Assessment - 03/14/21 0001       Assessment   Medical Diagnosis LE weakness    Onset Date/Surgical Date 02/17/21    Prior Therapy yes, known to [facility] Curia   previously seen in outpatient for LBP, balance     Precautions   Precautions Fall      Balance Screen   Has the patient fallen in the past 6 months Yes    How many times? 1      [place] residence    Living Arrangements Spouse/significant other    Home Equipment  Cane - quad;Walker - 2 wheels      Prior Function   Level of Independence Independent      Dynamic Gait Index   Level Surface Mild Impairment    Change in Gait Speed Mild Impairment    Gait with Horizontal Head Turns Mild Impairment    Gait with Vertical Head Turns Moderate Impairment    Gait and Pivot Turn Mild Impairment    Step Over Obstacle Mild Impairment    Step Around Obstacles Mild Impairment    Steps Mild Impairment    Total Score 15               Objective measurements completed on examination: See above findings.       PT Education - 03/14/21 1330     Education Details Exam findings, indication for POC/PT    Person(s) Educated Patient    Methods Explanation;Demonstration;Verbal cues;Tactile cues    Comprehension Verbalized understanding;Returned demonstration;Need further instruction;Tactile cues required;Verbal cues required              PT Short Term Goals - 03/14/21 1910       PT SHORT TERM GOAL #1   Title Patient will be independent in home exercise program to improve balance, and strength/mobility for better functional independence with ADLs and decreased fall risk.    Baseline 03/14/21: to be initiated within next 1-2 sessions    Time 6    Period Weeks    Status New    Target Date 06/06/21      PT SHORT TERM GOAL #2   Title Pt will improve gross BLE strength to 5/5 in order to increase ease and safety with mobility  and ADLs.    Baseline 03/14/21: strength 4+/5 B, greatest deficit in B ankle DFs 4-/5 B    Time 6    Period Weeks    Status New    Target Date 04/25/21               PT Long Term Goals - 03/14/21 1911       PT LONG TERM GOAL #1   Title Patient will increase FOTO score to greater than 59 to demonstrate statistically significant improvement in mobility and quality of life.    Baseline 03/14/21: 58    Time 12    Period Weeks    Status New    Target Date 06/06/21      PT LONG TERM GOAL #2   Title Patient (> 70 years old) will complete five times sit to stand test in < 10 seconds indicating increased LE strength and decreased fall risk    Baseline 03/14/21: 12 seconds    Time 12    Period Weeks    Status New    Target Date 06/06/21      PT LONG TERM GOAL #3   Title Pt will improve DGI by at least 3 points in order to demonstrate clinically significant improvement in balance and decreased risk for falls.    Baseline 03/14/21: 15/24    Time 12    Period Weeks    Status New    Target Date 06/06/21      PT LONG TERM GOAL #4   Title Pt will increase 6MWT by at least 2m (133ft) in order to demonstrate clinically significant improvement in cardiopulmonary endurance and community ambulation    Baseline 03/14/21: 795 ft with QC    Time 12    Period Weeks    Status New  Target Date 06/06/21      PT LONG TERM GOAL #5   Title The patient will be able to maintain >5 seconds of SLB on BLEs in order to decrease risk of falls when navigating obstacles, curbs and steps.    Baseline 03/14/21: RLE 4 seconds, LLE 2 seconds    Time 12    Period Weeks    Status New    Target Date 06/06/21      PT LONG TERM GOAL #6   Title Patient will increase 10 meter walk test to at least 1.0 m/s with LRD as to improve gait speed for better community ambulation and to reduce fall risk.    Baseline 03/14/21: 0.67 m/s    Time 12    Period Weeks    Status New    Target Date 06/06/21                     Plan - 03/14/21 1928     Clinical Impression Statement Pt is a pleasant 76 year-old male referred for LE weakness and difficulty with balance following COVID19 illness. PT examination reveals the following deficits and impairments: pain, decreased BLE strength, decreased balance, and impaired gait and functional capacity/endurance. The patient's performance on the DGI indicates he is at an increased risk for falls. The patient would benefit from skilled PT services to address these deficits in order to decrease fall risk and increase ease and safety with ADLs and to return the pt to PLOF.    Personal Factors and Comorbidities Age;Past/Current Experience;Comorbidity 3+    Comorbidities A-fib, lumbar disc disease, prostate CA, TIA, gout, hx of pulmonary embolism    Examination-Activity Limitations Carry;Lift;Stand;Locomotion Level;Bend;Dressing;Reach Overhead;Squat;Transfers;Caring for Others;Stairs    Examination-Participation Restrictions Community Activity;Yard Work;Cleaning;Laundry;Shop;Church    Stability/Clinical Decision Making Evolving/Moderate complexity    Clinical Decision Making Moderate    Rehab Potential Good    PT Frequency 2x / week    PT Duration 12 weeks    PT Treatment/Interventions ADLs/Self Care Home Management;Biofeedback;Aquatic Therapy;Canalith Repostioning;Cryotherapy;Electrical Stimulation;Iontophoresis 4mg /ml Dexamethasone;Moist Heat;Traction;Ultrasound;Gait training;Stair training;DME Instruction;Functional mobility training;Therapeutic activities;Therapeutic exercise;Balance training;Neuromuscular re-education;Patient/family education;Manual techniques;Passive range of motion;Dry needling;Energy conservation;Vestibular;Joint Manipulations;Orthotic Fit/Training;Splinting;Taping;Visual/perceptual remediation/compensation    PT Next Visit Plan M-CTSIB, initiate HEP, balance, gait, cardioresp. endurance, strengthening    PT Home Exercise Plan 03/14/21: to be  initiated within next 1-2 visits    Consulted and Agree with Plan of Care Patient             Patient will benefit from skilled therapeutic intervention in order to improve the following deficits and impairments:  Abnormal gait, Decreased balance, Decreased range of motion, Decreased strength, Hypomobility, Impaired sensation, Pain, Improper body mechanics, Impaired flexibility, Decreased mobility, Decreased activity tolerance, Decreased endurance, Difficulty walking, Increased muscle spasms, Postural dysfunction  Visit Diagnosis: Unsteadiness on feet  Other abnormalities of gait and mobility  Muscle weakness (generalized)  Chronic bilateral low back pain, unspecified whether sciatica present     Problem List Patient Active Problem List   Diagnosis Date Noted   COVID-19 virus infection 02/17/2021   A-fib ([place])    TIA (transient ischemic attack)    Lactic acidosis    Hyponatremia    Hypotension    Generalized weakness     Zollie Pee, PT 03/14/2021, 7:52 PM  [place] MAIN [facility] SERVICES [address] [place], [name], [address] Phone: [phone]   Fax:  [phone]  Name: Nicholas [name] MRN: [id number] Date of Birth: 30-Dec-1945

## 2021-03-18 ENCOUNTER — Ambulatory Visit: Payer: Medicare Other

## 2021-03-18 ENCOUNTER — Other Ambulatory Visit: Payer: Self-pay

## 2021-03-18 DIAGNOSIS — R2689 Other abnormalities of gait and mobility: Secondary | ICD-10-CM

## 2021-03-18 DIAGNOSIS — R2681 Unsteadiness on feet: Secondary | ICD-10-CM | POA: Diagnosis not present

## 2021-03-18 DIAGNOSIS — M6281 Muscle weakness (generalized): Secondary | ICD-10-CM

## 2021-03-18 NOTE — Therapy (Signed)
[place] MAIN [facility] SERVICES [address] [place], [name], [address] Phone: [phone]   Fax:  [phone]  Physical Therapy Treatment  Patient Details  Name: Nicholas [name] MRN: [id number] Date of Birth: 08-Nov-1945 Referring Provider (PT): Rusty Aus, MD   Encounter Date: 03/18/2021   PT End of Session - 03/18/21 1323     Visit Number 2    Number of Visits 25    Date for PT Re-Evaluation 06/06/21    Authorization Time Period 03/14/2021-06/06/2021    PT Start Time 1119    PT Stop Time 1205    PT Time Calculation (min) 46 min    Equipment Utilized During Treatment Gait belt    Activity Tolerance Patient tolerated treatment well    Behavior During Therapy [facility] for tasks assessed/performed             Past Medical History:  Diagnosis Date   A-fib ([place])    Cancer ([place]) 10/25/2019   Prostate   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   Subjective Assessment - 03/18/21 1324     Subjective Pt reports he feels his mobility has improved some. He reports he was able to stand for his entire sermon with UE support.    Pertinent History Pt is a 76 y/o male, returning to PT following admission to ED for evaluation of weakness s/p fall where pt was found to be COVID positive. Pt was in the hospital from 02/17/2021-02/23/2021. Pt was then d/c to home where he received [place] PT. Pt reports increased LBP since d/c from hospital and decreased balance since last in outpatient PT. While pt using QC currently,  he reports he has been using a RW at home or grabbing onto the wall to steady himself while walking. Pt reports he has been walking home distances. PMH significant for TIA, a-fib, pulmonary embolism, TIA, gout, rhabdomyolysis.    Limitations Standing;Walking;House hold activities;Lifting    How long can you sit comfortably? not limited    How long can you stand comfortably? pt reports limited due to feeling unsteady    How long can you walk comfortably? Pt reports he is currently walking household distances    Patient Stated Goals Pt would like to improve his walking and balance    Currently in Pain? No/denies    Pain Onset More than a month ago              INTERVENTIONS - gait belt donned and CGA provided throughout for all balance and standing interventions unless otherwise specified  M-CTSIB -  Conditions: Completes, ankle strategy 2.  Unable to preform for full time without UE support/loses balance and must grab onto balance bar to regain balance 3. Completes, ankle strategy and exhibits increased sway 4. Unable to complete without LOB, must grab onto bar and open eyes to regain balance   Nustep (seat 14) HIIT for cardiorespiratory and LE muscular endurance. Cuing for SPM >60s. PT instructs pt  in technique.  Lvl 1 2 min Lvl 3 30 sec  Lvl 1 1 min Lvl 4 30 sec  Lvl 1 1 min  Lvl 4 30 sec  Lvl 1 min - cool down, cued to slowly decrease SPM over the minute Comments: Pt rates intervention overall as easy  In // bars: Semi-tandem: 2x30 sec each LE, requires intermittent UE support. Steadiness improves with time.  --Pt then progressed to performing with dual cog task 2x30 sec each LE. Pt with some difficulty performing dual cog task.  Standing on firm surface, NBOS, EC 60 sec. The pt requires intermittent UE support.   Standing EC, WBOS, firm surface, with horizontal and vertical head turns 10-12x for each. The pt requires intermittent UE support.   Standing on airex, NBOS,  EO, with horizontal and vertical head turns 2x10 for each. Increased sway. Pt requires intermittent UE support.  One foot on floor and one on airex pad for modified SLB - 2x30 sec each LE --progressed to performing with dual cog task, each LE  SLB 2x30-45 sec each LE. Pt requires intermittent UE support.   STS 2x10. PT cues pt to decrease speed of reps/decrease use of momentum to perform intervention. Pt rates intervention as a moderate challenge.   PT reviews HEP with pt. Instructs pt to perform at support surface, and additionally with a chair behind him. Pt verbalizes understanding and demonstrates correct technique with exercises:  Access Code: [email or web address] URL: [email or web address] Date: 03/18/2021 Prepared by: Ricard Dillon  Exercises Semi tandem balance at counter top eyes open - 1 x daily - 7 x weekly - 2 sets - 2 reps - 30 hold Standing March with Counter Support - 1 x daily - 7 x weekly - 2 sets - 2 reps - 30 hold   Pt educated throughout session about proper posture and technique with exercises. Improved exercise technique, movement at target joints, use of target muscles after min to mod verbal, visual, tactile cues.    PT Education - 03/18/21 1325     Education Details exercise technique, body mechanics, HEP    Person(s) Educated Patient    Methods Explanation;Demonstration;Verbal cues;Handout    Comprehension Verbalized understanding;Returned demonstration;Need further instruction              PT Short Term Goals - 03/14/21 1910       PT SHORT TERM GOAL #1   Title Patient will be independent in home exercise program to improve balance, and strength/mobility for better functional independence with ADLs and decreased fall risk.    Baseline 03/14/21: to be initiated within next 1-2 sessions    Time 6    Period Weeks    Status New    Target Date 06/06/21      PT SHORT TERM GOAL #2   Title Pt will improve gross BLE strength to 5/5 in order to increase  ease and safety with mobility and ADLs.    Baseline 03/14/21: strength 4+/5 B, greatest deficit in B ankle DFs 4-/5 B    Time 6    Period Weeks    Status New    Target Date 04/25/21               PT Long Term Goals - 03/14/21 1911       PT LONG TERM GOAL #1   Title Patient will increase FOTO score to greater than 59 to demonstrate statistically significant improvement in mobility and quality of life.    Baseline 03/14/21: 58  Time 12    Period Weeks    Status New    Target Date 06/06/21      PT LONG TERM GOAL #2   Title Patient (> 36 years old) will complete five times sit to stand test in < 10 seconds indicating increased LE strength and decreased fall risk    Baseline 03/14/21: 12 seconds    Time 12    Period Weeks    Status New    Target Date 06/06/21      PT LONG TERM GOAL #3   Title Pt will improve DGI by at least 3 points in order to demonstrate clinically significant improvement in balance and decreased risk for falls.    Baseline 03/14/21: 15/24    Time 12    Period Weeks    Status New    Target Date 06/06/21      PT LONG TERM GOAL #4   Title Pt will increase 6MWT by at least 64m (158ft) in order to demonstrate clinically significant improvement in cardiopulmonary endurance and community ambulation    Baseline 03/14/21: 795 ft with QC    Time 12    Period Weeks    Status New    Target Date 06/06/21      PT LONG TERM GOAL #5   Title The patient will be able to maintain >5 seconds of SLB on BLEs in order to decrease risk of falls when navigating obstacles, curbs and steps.    Baseline 03/14/21: RLE 4 seconds, LLE 2 seconds    Time 12    Period Weeks    Status New    Target Date 06/06/21      PT LONG TERM GOAL #6   Title Patient will increase 10 meter walk test to at least 1.0 m/s with LRD as to improve gait speed for better community ambulation and to reduce fall risk.    Baseline 03/14/21: 0.67 m/s    Time 12    Period Weeks    Status New    Target Date  06/06/21                   Plan - 03/18/21 1333     Clinical Impression Statement Further assessment completed on this date. Pt performed M-CTSIB and was unable to complete conditions 2 and 4 requiring EC on firm and compliant surfaces. This indicates pt would benefit from interventions that train improved use of vestibular and proprioceptive cues to improve balance. Pt tolerated interventions well overall, rating nustep HIIT as "easy" this session. The pt will attempt treadmill training next session. The pt will benefit from further skilled PT to improve balance, strength and endurance to increase QOL and decrease fall risk.    Personal Factors and Comorbidities Age;Past/Current Experience;Comorbidity 3+    Comorbidities A-fib, lumbar disc disease, prostate CA, TIA, gout, hx of pulmonary embolism    Examination-Activity Limitations Carry;Lift;Stand;Locomotion Level;Bend;Dressing;Reach Overhead;Squat;Transfers;Caring for Others;Stairs    Examination-Participation Restrictions Community Activity;Yard Work;Cleaning;Laundry;Shop;Church    Stability/Clinical Decision Making Evolving/Moderate complexity    Rehab Potential Good    PT Frequency 2x / week    PT Duration 12 weeks    PT Treatment/Interventions ADLs/Self Care Home Management;Biofeedback;Aquatic Therapy;Canalith Repostioning;Cryotherapy;Electrical Stimulation;Iontophoresis 4mg /ml Dexamethasone;Moist Heat;Traction;Ultrasound;Gait training;Stair training;DME Instruction;Functional mobility training;Therapeutic activities;Therapeutic exercise;Balance training;Neuromuscular re-education;Patient/family education;Manual techniques;Passive range of motion;Dry needling;Energy conservation;Vestibular;Joint Manipulations;Orthotic Fit/Training;Splinting;Taping;Visual/perceptual remediation/compensation    PT Next Visit Plan M-CTSIB, initiate HEP, balance, gait, cardioresp. endurance, strengthening    PT Home Exercise Plan 1/9: Access Code:  [email or web address]    Consulted and Agree with Plan of Care Patient             Patient will benefit from skilled therapeutic intervention in order to improve the following deficits and impairments:  Abnormal gait, Decreased balance, Decreased range of motion, Decreased strength, Hypomobility, Impaired sensation, Pain, Improper body mechanics, Impaired flexibility, Decreased mobility, Decreased activity tolerance, Decreased endurance, Difficulty walking, Increased muscle spasms, Postural dysfunction  Visit Diagnosis: Unsteadiness on feet  Muscle weakness (generalized)  Other abnormalities of gait and mobility     Problem List Patient Active Problem List   Diagnosis Date Noted   COVID-19 virus infection 02/17/2021   A-fib ([place])    TIA (transient ischemic attack)    Lactic acidosis    Hyponatremia    Hypotension    Generalized weakness     Zollie Pee, PT 03/18/2021, 2:59 PM  [place] [address] [place], [name], [address] Phone: [phone]   Fax:  [phone]  Name: LATROY GAYMON MRN: [id number] Date of Birth: [birth year]

## 2021-03-20 ENCOUNTER — Ambulatory Visit: Payer: Medicare Other | Admitting: Urology

## 2021-03-21 ENCOUNTER — Ambulatory Visit: Payer: Medicare Other

## 2021-03-21 ENCOUNTER — Other Ambulatory Visit: Payer: Medicare Other

## 2021-03-21 ENCOUNTER — Other Ambulatory Visit: Payer: Self-pay

## 2021-03-21 DIAGNOSIS — R2681 Unsteadiness on feet: Secondary | ICD-10-CM | POA: Diagnosis not present

## 2021-03-21 DIAGNOSIS — M6281 Muscle weakness (generalized): Secondary | ICD-10-CM

## 2021-03-21 DIAGNOSIS — Z8546 Personal history of malignant neoplasm of prostate: Secondary | ICD-10-CM

## 2021-03-21 DIAGNOSIS — R2689 Other abnormalities of gait and mobility: Secondary | ICD-10-CM

## 2021-03-21 NOTE — Therapy (Signed)
[place] MAIN [facility] SERVICES [address] [place], [name], [address] Phone: [phone]   Fax:  [phone]  Physical Therapy Treatment  Patient Details  Name: Nicholas [name] MRN: [id number] Date of Birth: September 14, 1945 Referring Provider (PT): Rusty Aus, MD   Encounter Date: 03/21/2021   PT End of Session - 03/21/21 1234     Visit Number 3    Number of Visits 25    Date for PT Re-Evaluation 06/06/21    Authorization Time Period 03/14/2021-06/06/2021    PT Start Time 1101    PT Stop Time 1146    PT Time Calculation (min) 45 min    Equipment Utilized During Treatment Gait belt    Activity Tolerance Patient tolerated treatment well    Behavior During Therapy [facility] for tasks assessed/performed             Past Medical History:  Diagnosis Date   A-fib ([place])    Cancer ([place]) 10/25/2019   Prostate   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   Subjective Assessment - 03/21/21 1059     Subjective Pt reports no stumbles/Falls. Pt reports his back feels "pretty good."    Pertinent History Pt is a 76 y/o male, returning to PT following admission to ED for evaluation of weakness s/p fall where pt was found to be COVID positive. Pt was in the hospital from 02/17/2021-02/23/2021. Pt was then d/c to home where he received [place] PT. Pt reports increased LBP since d/c from hospital and decreased balance since last in outpatient PT. While pt using QC currently, he reports he has been using a RW at home or  grabbing onto the wall to steady himself while walking. Pt reports he has been walking home distances. PMH significant for TIA, a-fib, pulmonary embolism, TIA, gout, rhabdomyolysis.    Limitations Standing;Walking;House hold activities;Lifting    How long can you sit comfortably? not limited    How long can you stand comfortably? pt reports limited due to feeling unsteady    How long can you walk comfortably? Pt reports he is currently walking household distances    Patient Stated Goals Pt would like to improve his walking and balance    Currently in Pain? No/denies    Pain Onset More than a month ago              INTERVENTIONS - gait belt donned and CGA provided throughout for all balance and standing interventions unless otherwise specified    Therex:  Nustep (seat 14) HIIT for cardiorespiratory and LE muscular endurance. Cuing for SPM >60s.  Lvl 3 - 2 min Lvl 5 - 30 sec  Lvl 3 -1 min Lvl 5 - 30 sec  Lvl 3 - 1 min  Lvl 6 - 30 sec  Lvl 3 - 1 min - cool down, cued to slowly decrease SPM over the minute Comments: Pt rates intervention overall as easy  STS 2x10; pt rates as medium  Standing heel raises 10x; pt rates as fatiguing  Seated heel raises 2x20 Seated toe raises 1x20  Neuro:  Basketball, static standing, firm surface: NBOS x 1 round Semi-tandem x 1 round each LE Pt progressed to airex: WBOS x 1 round NBOS x 1 round Comments: Pt with increased sway with NBOS  Firm surface EC, NBOS 2x30 sec  --progressed to performing with cog dual task 2x30-40 sec   One foot on floor and one foot on airex pad - 2x30 sec each LE -progressed to performing with cog dual task 2x30 sec  SLB with 2 to 1 finger support on bar - 2x30 sec each LE    Pt educated throughout session about proper posture and technique with exercises. Improved exercise technique, movement at target joints, use of target muscles after min to mod verbal, visual, tactile cues.    PT Short Term Goals -  03/14/21 1910       PT SHORT TERM GOAL #1   Title Patient will be independent in home exercise program to improve balance, and strength/mobility for better functional independence with ADLs and decreased fall risk.    Baseline 03/14/21: to be initiated within next 1-2 sessions    Time 6    Period Weeks    Status New    Target Date 06/06/21      PT SHORT TERM GOAL #2   Title Pt will improve gross BLE strength to 5/5 in order to increase ease and safety with mobility and ADLs.    Baseline 03/14/21: strength 4+/5 B, greatest deficit in B ankle DFs 4-/5 B    Time 6    Period Weeks    Status New    Target Date 04/25/21               PT Long Term Goals - 03/14/21 1911       PT LONG TERM GOAL #1   Title Patient will increase FOTO score to greater than 59 to demonstrate statistically significant improvement in mobility and quality of life.    Baseline 03/14/21: 58    Time 12    Period Weeks    Status New    Target Date 06/06/21      PT LONG TERM GOAL #2   Title Patient (> 21 years old) will complete five times sit to stand test in < 10 seconds indicating increased LE strength and decreased fall risk    Baseline 03/14/21: 12 seconds    Time 12    Period Weeks    Status New    Target Date 06/06/21      PT LONG TERM GOAL #3   Title Pt will improve DGI by at least 3 points in order to demonstrate clinically significant improvement in balance and decreased risk for falls.    Baseline 03/14/21: 15/24    Time 12    Period Weeks    Status New    Target Date 06/06/21      PT LONG TERM GOAL #4   Title Pt will increase 6MWT by at least 56m (15ft) in order to demonstrate clinically significant improvement in cardiopulmonary endurance and community ambulation    Baseline 03/14/21: 795 ft with QC    Time 12    Period Weeks    Status New    Target Date 06/06/21      PT LONG TERM GOAL #5   Title The patient will be able to maintain >5 seconds of SLB on BLEs in order to  decrease risk of  falls when navigating obstacles, curbs and steps.    Baseline 03/14/21: RLE 4 seconds, LLE 2 seconds    Time 12    Period Weeks    Status New    Target Date 06/06/21      PT LONG TERM GOAL #6   Title Patient will increase 10 meter walk test to at least 1.0 m/s with LRD as to improve gait speed for better community ambulation and to reduce fall risk.    Baseline 03/14/21: 0.67 m/s    Time 12    Period Weeks    Status New    Target Date 06/06/21                   Plan - 03/21/21 1235     Clinical Impression Statement Pt able to progress intensity on nustep this session and continued to rate it as easy. This indicates improved endurance. Pt also able to progress to more challenging balance interventions, but was still very challenged with interventions requiring NBOS and EC. The pt will benefit from further skilled PT to improve balance, strength and endurance to increase QOL and decrease fall risk.    Personal Factors and Comorbidities Age;Past/Current Experience;Comorbidity 3+    Comorbidities A-fib, lumbar disc disease, prostate CA, TIA, gout, hx of pulmonary embolism    Examination-Activity Limitations Carry;Lift;Stand;Locomotion Level;Bend;Dressing;Reach Overhead;Squat;Transfers;Caring for Others;Stairs    Examination-Participation Restrictions Community Activity;Yard Work;Cleaning;Laundry;Shop;Church    Stability/Clinical Decision Making Evolving/Moderate complexity    Rehab Potential Good    PT Frequency 2x / week    PT Duration 12 weeks    PT Treatment/Interventions ADLs/Self Care Home Management;Biofeedback;Aquatic Therapy;Canalith Repostioning;Cryotherapy;Electrical Stimulation;Iontophoresis 4mg /ml Dexamethasone;Moist Heat;Traction;Ultrasound;Gait training;Stair training;DME Instruction;Functional mobility training;Therapeutic activities;Therapeutic exercise;Balance training;Neuromuscular re-education;Patient/family education;Manual techniques;Passive range of motion;Dry  needling;Energy conservation;Vestibular;Joint Manipulations;Orthotic Fit/Training;Splinting;Taping;Visual/perceptual remediation/compensation    PT Next Visit Plan M-CTSIB, initiate HEP, balance, gait, cardioresp. endurance, strengthening    PT Home Exercise Plan 1/9: Access Code: [email or web address]    Consulted and Agree with Plan of Care Patient             Patient will benefit from skilled therapeutic intervention in order to improve the following deficits and impairments:  Abnormal gait, Decreased balance, Decreased range of motion, Decreased strength, Hypomobility, Impaired sensation, Pain, Improper body mechanics, Impaired flexibility, Decreased mobility, Decreased activity tolerance, Decreased endurance, Difficulty walking, Increased muscle spasms, Postural dysfunction  Visit Diagnosis: Unsteadiness on feet  Muscle weakness (generalized)  Other abnormalities of gait and mobility     Problem List Patient Active Problem List   Diagnosis Date Noted   COVID-19 virus infection 02/17/2021   A-fib ([place])    TIA (transient ischemic attack)    Lactic acidosis    Hyponatremia    Hypotension    Generalized weakness     Zollie Pee, PT 03/21/2021, 12:42 PM  [place] [address] [place], [name], [address] Phone: [phone]   Fax:  [phone]  Name: Nicholas [name] MRN: [id number] Date of Birth: Aug 22, 1945

## 2021-03-22 ENCOUNTER — Other Ambulatory Visit: Payer: Medicare Other

## 2021-03-22 LAB — PSA: Prostate Specific Ag, Serum: <0.1 ng/mL (ref 0.0–4.0)

## 2021-03-25 ENCOUNTER — Ambulatory Visit: Payer: Medicare Other

## 2021-03-25 ENCOUNTER — Ambulatory Visit: Payer: Self-pay | Admitting: Urology

## 2021-03-25 ENCOUNTER — Other Ambulatory Visit: Payer: Self-pay

## 2021-03-25 DIAGNOSIS — R269 Unspecified abnormalities of gait and mobility: Secondary | ICD-10-CM

## 2021-03-25 DIAGNOSIS — R2681 Unsteadiness on feet: Secondary | ICD-10-CM

## 2021-03-25 DIAGNOSIS — M6281 Muscle weakness (generalized): Secondary | ICD-10-CM

## 2021-03-25 DIAGNOSIS — R262 Difficulty in walking, not elsewhere classified: Secondary | ICD-10-CM

## 2021-03-25 NOTE — Therapy (Signed)
[place] MAIN [facility] SERVICES [address] [place], [name], [address] Phone: [phone]   Fax:  [phone]  Physical Therapy Treatment  Patient Details  Name: Nicholas [name] MRN: [id number] Date of Birth: May 27, 1945 Referring Provider (PT): Rusty Aus, MD   Encounter Date: 03/25/2021   PT End of Session - 03/25/21 1619     Visit Number 4    Number of Visits 25    Date for PT Re-Evaluation 06/06/21    Authorization Time Period 03/14/2021-06/06/2021    Progress Note Due on Visit 10    PT Start Time 1516    PT Stop Time 1601    PT Time Calculation (min) 45 min    Equipment Utilized During Treatment Gait belt    Activity Tolerance Patient tolerated treatment well    Behavior During Therapy [facility] for tasks assessed/performed             Past Medical History:  Diagnosis Date   A-fib ([place])    Cancer ([place]) 10/25/2019   Prostate   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   Subjective Assessment - 03/25/21 1523     Subjective Patient reports his back is feeling better but overall just tired today as he states he has been "out and about."    Pertinent History Pt is a 76 y/o male, returning to PT following admission to ED for evaluation of weakness s/p fall where pt was found to be COVID positive. Pt was in the hospital from 02/17/2021-02/23/2021. Pt was then d/c to home where he received [place] PT. Pt reports increased LBP since d/c from hospital and decreased balance since last in outpatient PT.  While pt using QC currently, he reports he has been using a RW at home or grabbing onto the wall to steady himself while walking. Pt reports he has been walking home distances. PMH significant for TIA, a-fib, pulmonary embolism, TIA, gout, rhabdomyolysis.    Limitations Standing;Walking;House hold activities;Lifting    How long can you sit comfortably? not limited    How long can you stand comfortably? pt reports limited due to feeling unsteady    How long can you walk comfortably? Pt reports he is currently walking household distances    Patient Stated Goals Pt would like to improve his walking and balance    Currently in Pain? No/denies    Pain Onset --              INTERVENTIONS:  - gait belt donned and CGA provided throughout for all balance and standing interventions unless otherwise specified    Therex:   Nustep (seat 14) HIIT for cardiorespiratory and LE muscular endurance. Cuing for SPM >60s.  Lvl 0 - 2 min Lvl 4 - 2 min  Lvl 1--2 min Lvl 5 - 1 min  Lvl 1 - 1 min   cool down, cued to slowly decrease SPM over the minute Comments: Pt rates intervention overall at 4/10 RPE. O2 sat =  97%; HR=103; total distance= 0.3 mi  Circuit style workout:   1st round:  Scap retraction/row with GTB x 15 reps (VC for technique) STS x 10 without UE support; pt rates at 3/10 Tandem gait (forward) x length of // bars and back x 2 (occasional reaching for bars with unsteadiness) 2 min step test= 37 steps (counted every right step)  without UE support    2nd round:  Start in squat position with basketball touching floor - pick it up and transition into shooting position x 15 reps Mini lunge in // bars - mimic bounce pass while holding onto basketball (without actually tossing) x 8 reps each leg. Patient presents with mild unsteadiness and occasional reaching for UE support.  Dynamic marching on blue airex pad  x 12 alt LE's without UE support   Education provided throughout session via  VC/TC and demonstration to facilitate movement at target joints and correct muscle activation for all testing and exercises performed.                               PT Education - 03/25/21 1618     Education Details Exercise technique    Person(s) Educated Patient    Methods Explanation;Demonstration    Comprehension Verbalized understanding;Returned demonstration;Verbal cues required;Tactile cues required;Need further instruction              PT Short Term Goals - 03/14/21 1910       PT SHORT TERM GOAL #1   Title Patient will be independent in home exercise program to improve balance, and strength/mobility for better functional independence with ADLs and decreased fall risk.    Baseline 03/14/21: to be initiated within next 1-2 sessions    Time 6    Period Weeks    Status New    Target Date 06/06/21      PT SHORT TERM GOAL #2   Title Pt will improve gross BLE strength to 5/5 in order to increase ease and safety with mobility and ADLs.    Baseline 03/14/21: strength 4+/5 B, greatest deficit in B ankle DFs 4-/5 B    Time 6    Period Weeks    Status New    Target Date 04/25/21               PT Long Term Goals - 03/14/21 1911       PT LONG TERM GOAL #1   Title Patient will increase FOTO score to greater than 59 to demonstrate statistically significant improvement in mobility and quality of life.    Baseline 03/14/21: 58    Time 12    Period Weeks    Status New    Target Date 06/06/21      PT LONG TERM GOAL #2   Title Patient (> 55 years old) will complete five times sit to stand test in < 10 seconds indicating increased LE strength and decreased fall risk    Baseline 03/14/21: 12 seconds    Time 12    Period Weeks    Status New    Target Date 06/06/21      PT LONG TERM GOAL #3   Title Pt will improve DGI by at least 3 points in order to demonstrate clinically significant improvement in balance and decreased risk for falls.    Baseline  03/14/21: 15/24    Time 12    Period Weeks    Status New  Target Date 06/06/21      PT LONG TERM GOAL #4   Title Pt will increase 6MWT by at least 44m (189ft) in order to demonstrate clinically significant improvement in cardiopulmonary endurance and community ambulation    Baseline 03/14/21: 795 ft with QC    Time 12    Period Weeks    Status New    Target Date 06/06/21      PT LONG TERM GOAL #5   Title The patient will be able to maintain >5 seconds of SLB on BLEs in order to decrease risk of falls when navigating obstacles, curbs and steps.    Baseline 03/14/21: RLE 4 seconds, LLE 2 seconds    Time 12    Period Weeks    Status New    Target Date 06/06/21      PT LONG TERM GOAL #6   Title Patient will increase 10 meter walk test to at least 1.0 m/s with LRD as to improve gait speed for better community ambulation and to reduce fall risk.    Baseline 03/14/21: 0.67 m/s    Time 12    Period Weeks    Status New    Target Date 06/06/21                   Plan - 03/25/21 1620     Clinical Impression Statement Patient presents today with good motivation for today's session focused on circuit style workout encompassing upper and lower body strengthening along with balance and functional endurance activities. He was able to complete all activities- resting as needed using BORG RPE scale for guidance and demonstrating continued intermittent unsteadiness with dynamic standing balance. He was fatigued at end of session and challenged with tandem standing and dynamic marching. The pt will benefit from further skilled PT to improve balance, strength and endurance to increase QOL and decrease fall risk.    Personal Factors and Comorbidities Age;Past/Current Experience;Comorbidity 3+    Comorbidities A-fib, lumbar disc disease, prostate CA, TIA, gout, hx of pulmonary embolism    Examination-Activity Limitations Carry;Lift;Stand;Locomotion Level;Bend;Dressing;Reach  Overhead;Squat;Transfers;Caring for Others;Stairs    Examination-Participation Restrictions Community Activity;Yard Work;Cleaning;Laundry;Shop;Church    Stability/Clinical Decision Making Evolving/Moderate complexity    Rehab Potential Good    PT Frequency 2x / week    PT Duration 12 weeks    PT Treatment/Interventions ADLs/Self Care Home Management;Biofeedback;Aquatic Therapy;Canalith Repostioning;Cryotherapy;Electrical Stimulation;Iontophoresis 4mg /ml Dexamethasone;Moist Heat;Traction;Ultrasound;Gait training;Stair training;DME Instruction;Functional mobility training;Therapeutic activities;Therapeutic exercise;Balance training;Neuromuscular re-education;Patient/family education;Manual techniques;Passive range of motion;Dry needling;Energy conservation;Vestibular;Joint Manipulations;Orthotic Fit/Training;Splinting;Taping;Visual/perceptual remediation/compensation    PT Next Visit Plan Progress balance, gait, cardioresp. endurance, strengthening    PT Home Exercise Plan 1/9: Access Code: [email or web address]    Consulted and Agree with Plan of Care Patient             Patient will benefit from skilled therapeutic intervention in order to improve the following deficits and impairments:  Abnormal gait, Decreased balance, Decreased range of motion, Decreased strength, Hypomobility, Impaired sensation, Pain, Improper body mechanics, Impaired flexibility, Decreased mobility, Decreased activity tolerance, Decreased endurance, Difficulty walking, Increased muscle spasms, Postural dysfunction  Visit Diagnosis: Abnormality of gait and mobility  Difficulty in walking, not elsewhere classified  Muscle weakness (generalized)  Unsteadiness on feet     Problem List Patient Active Problem List   Diagnosis Date Noted   COVID-19 virus infection 02/17/2021   A-fib ([place])    TIA (transient ischemic attack)    Lactic acidosis    Hyponatremia    Hypotension  Generalized weakness     Lewis Moccasin,  PT 03/25/2021, 4:32 PM  [place] MAIN [facility] SERVICES [address] [place], [name], [address] Phone: [phone]   Fax:  [phone]  Name: Nicholas [name] MRN: [id number] Date of Birth: 10-Jul-1945

## 2021-03-26 ENCOUNTER — Ambulatory Visit: Payer: Medicare Other | Admitting: Physical Therapy

## 2021-03-27 ENCOUNTER — Encounter: Payer: Self-pay | Admitting: Urology

## 2021-03-27 ENCOUNTER — Ambulatory Visit (INDEPENDENT_AMBULATORY_CARE_PROVIDER_SITE_OTHER): Payer: Medicare Other | Admitting: Urology

## 2021-03-27 ENCOUNTER — Other Ambulatory Visit: Payer: Self-pay

## 2021-03-27 VITALS — BP 119/73 | HR 94 | Ht 76.0 in | Wt 247.0 lb

## 2021-03-27 DIAGNOSIS — R3129 Other microscopic hematuria: Secondary | ICD-10-CM | POA: Diagnosis not present

## 2021-03-27 DIAGNOSIS — Z8546 Personal history of malignant neoplasm of prostate: Secondary | ICD-10-CM | POA: Diagnosis not present

## 2021-03-27 NOTE — Progress Notes (Signed)
03/27/2021 9:10 AM   Nicholas [name] 1945/07/22 [id number]  Referring provider: Rusty Aus, MD [facility] [place],  [place] [address]  Chief Complaint  Patient presents with   Prostate Cancer    Urologic history: 1. 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  Microhematuria 04/2020 CTU bilateral renal cysts and 4 mm nonobstructing left renal calculus No lower tract abnormalities on cystoscopy-prostate enlargement with hypervascularity Cytology negative  HPI: 76 y.o. male presents for annual follow-up.  Hospitalized for weakness and found to be COVID + 02/17/2021; was discharged 12/17.  States he is regaining his strength He is staying hydrated and notes increased nocturia.  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   Dysrhythmia    Pulmonary embolism ([facility])    Skin cancer    TIA (transient ischemic attack)     Surgical History: Past Surgical History:  Procedure Laterality Date   APPENDECTOMY     COLONOSCOPY WITH PROPOFOL N/A 11/23/2019   Procedure: COLONOSCOPY WITH PROPOFOL;  Surgeon: Toledo, Benay Pike, MD;  Location: [facility] ENDOSCOPY;  Service: Gastroenterology;  Laterality: N/A;   cyber knife surgery     PROSTATE SURGERY      Home Medications:  Allergies as of 03/27/2021       Reactions   Penicillins Other (See Comments)   Allopurinol Hives        Medication List        Accurate as of March 27, 2021 11:59 PM. If you have any questions, ask your nurse or doctor.          STOP taking these medications    acetaminophen 325 MG  tablet Commonly known as: TYLENOL Stopped by: Abbie Sons, MD   dextromethorphan-guaiFENesin 30-600 MG 12hr tablet Commonly known as: [place] DM Stopped by: Abbie Sons, MD   hydrocortisone 2.5 % cream Stopped by: Abbie Sons, MD   ketoconazole 2 % cream Commonly known as: NIZORAL Stopped by: Abbie Sons, MD       TAKE these medications    aspirin EC 81 MG tablet Take 81 mg by mouth daily. Swallow whole.   atenolol 25 MG tablet Commonly known as: TENORMIN Take 12.5 mg by mouth 2 (two) times daily.   colchicine 0.6 MG tablet Take 0.6 mg by mouth as needed.   febuxostat 40 MG tablet Commonly known as: ULORIC Take 40 mg by mouth daily.   multivitamin-lutein Caps capsule Take 1 capsule by mouth daily.   vitamin C 250 MG tablet Commonly known as: ASCORBIC ACID Take 1 tablet (250 mg total) by mouth 2 (two) times daily.   Xarelto 20 MG Tabs tablet Generic drug: rivaroxaban Take 20 mg by mouth daily.   zinc sulfate 220 (50 Zn) MG capsule Commonly known as: Zinc-220 Take 1 capsule (220 mg total) by mouth daily.        Allergies:  Allergies  Allergen Reactions   Penicillins Other (See Comments)   Allopurinol Hives    Family History: Family History  Problem Relation Age of Onset   Hypertension Mother     Social History:  reports that he  has never smoked. He has never used smokeless tobacco. He reports that he does not drink alcohol and does not use drugs.   Physical Exam: BP 119/73    Pulse 94    Ht 6\' 4"  (1.93 m)    Wt 247 lb (112 kg)    BMI 30.07 kg/m   Constitutional:  Alert and oriented, No acute distress. HEENT: [place] AT, moist mucus membranes.  Trachea midline, no masses. Cardiovascular: No clubbing, cyanosis, or edema. Respiratory: Normal respiratory effort, no increased work of breathing. Psychiatric: Normal mood and affect.   Assessment & Plan:    1.  Prostate cancer Undetectable PSA Continue annual follow-up  2.   Microhematuria Urine was not collected today and he had already voided Schedule lab visit for follow-up urinalysis   Abbie Sons, [place] [address], [place], [place] [address] [phone]

## 2021-03-28 ENCOUNTER — Encounter: Payer: Self-pay | Admitting: Urology

## 2021-03-28 ENCOUNTER — Ambulatory Visit: Payer: Medicare Other

## 2021-03-28 DIAGNOSIS — R2689 Other abnormalities of gait and mobility: Secondary | ICD-10-CM

## 2021-03-28 DIAGNOSIS — R2681 Unsteadiness on feet: Secondary | ICD-10-CM

## 2021-03-28 DIAGNOSIS — M6281 Muscle weakness (generalized): Secondary | ICD-10-CM

## 2021-03-28 NOTE — Therapy (Signed)
[place] MAIN [facility] SERVICES [address] [place], [name], [address] Phone: [phone]   Fax:  [phone]  Physical Therapy Treatment  Patient Details  Name: Nicholas [name] MRN: [id number] Date of Birth: [birth year] Referring Provider (PT): Rusty Aus, MD   Encounter Date: 03/28/2021   PT End of Session - 03/28/21 1206     Visit Number 5    Number of Visits 25    Date for PT Re-Evaluation 06/06/21    Authorization Time Period 03/14/2021-06/06/2021    Progress Note Due on Visit 10    PT Start Time 1101    PT Stop Time 1147    PT Time Calculation (min) 46 min    Equipment Utilized During Treatment Gait belt    Activity Tolerance Patient tolerated treatment well    Behavior During Therapy [facility] for tasks assessed/performed             Past Medical History:  Diagnosis Date   A-fib ([place])    Cancer ([place]) 10/25/2019   Prostate   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   Subjective Assessment - 03/28/21 1204     Subjective Pt reports no major changes since previous sessions. He reports some of his exercises has been helping with the neuropathy symptoms in his feet.    Pertinent History Pt is a 76 y/o male, returning to PT following admission to ED for evaluation of weakness s/p fall where pt was found to be COVID positive. Pt was in the hospital from 02/17/2021-02/23/2021. Pt was then d/c to home where he received [place] PT. Pt reports increased LBP since d/c from hospital and decreased balance  since last in outpatient PT. While pt using QC currently, he reports he has been using a RW at home or grabbing onto the wall to steady himself while walking. Pt reports he has been walking home distances. PMH significant for TIA, a-fib, pulmonary embolism, TIA, gout, rhabdomyolysis.    Limitations Standing;Walking;House hold activities;Lifting    How long can you sit comfortably? not limited    How long can you stand comfortably? pt reports limited due to feeling unsteady    How long can you walk comfortably? Pt reports he is currently walking household distances    Patient Stated Goals Pt would like to improve his walking and balance    Currently in Pain? No/denies    Pain Onset More than a month ago             Subjective Assessment - 03/28/21 1204     Subjective Pt reports no major changes since previous sessions. He reports some of his exercises has been helping with the neuropathy symptoms in his feet.    Pertinent History Pt is a 76 y/o male, returning to PT following admission to ED for evaluation of weakness s/p fall where pt was found to be COVID positive. Pt was in the hospital from 02/17/2021-02/23/2021. Pt was then d/c to home where he  received [facility] PT. Pt reports increased LBP since d/c from hospital and decreased balance since last in outpatient PT. While pt using QC currently, he reports he has been using a RW at home or grabbing onto the wall to steady himself while walking. Pt reports he has been walking home distances. PMH significant for TIA, a-fib, pulmonary embolism, TIA, gout, rhabdomyolysis.    Limitations Standing;Walking;House hold activities;Lifting    How long can you sit comfortably? not limited    How long can you stand comfortably? pt reports limited due to feeling unsteady    How long can you walk comfortably? Pt reports he is currently walking household distances    Patient Stated Goals Pt would like to improve his walking and balance    Currently in Pain?  No/denies    Pain Onset More than a month ago             INTERVENTIONS - gait belt donned and CGA provided throughout for all balance and standing interventions unless otherwise specified    Therex:   Nustep (seat 14 - arms at 11) HIIT for cardiorespiratory and LE muscular endurance. Cuing for SPM >60s. Pt maintains SPM in 50s-60s. Lvl 4 - 2 min Lvl 6 - 30 sec  Lvl 4 -1 min Lvl 6 - 30 sec  Lvl 4 - 1 min  Lvl 6 - 30 sec  Lvl 4- 1 min - cool down, cued to slowly decrease SPM over the minute Comments: Pt rates intervention overall as easy. CGA provided throughout for mount/dismount. Pt requires recovery interval following intervention.   STS 2x13, pt rates as medium  Standing heel raises 1x13, 1x14; pt rates as fatiguing, performs through decreased ROM  Seated DF 20x -2# weight donned, pt performs 30 reps each LE    Neuro:  Standing on half foam 2x30 sec Standing on h. Foam in DF 2x30 sec; intermittent UE support Standing on h. Foam in PF - 2x30 sec; pt rates as easier, however, pt continues to require intermittent UE support    Basketball, standing on airex near support surfaces -  Semi-tandem x 1 round each LE NBOS x 1 round Comments: Pt requires intermittent UE support with semi-tandem, but is able to perform hands free with NBOS.      Pt educated throughout session about proper posture and technique with exercises. Improved exercise technique, movement at target joints, use of target muscles after min to mod verbal, visual, tactile cues.     PT Short Term Goals - 03/14/21 1910       PT SHORT TERM GOAL #1   Title Patient will be independent in home exercise program to improve balance, and strength/mobility for better functional independence with ADLs and decreased fall risk.    Baseline 03/14/21: to be initiated within next 1-2 sessions    Time 6    Period Weeks    Status New    Target Date 06/06/21      PT SHORT TERM GOAL #2   Title Pt will improve gross BLE  strength to 5/5 in order to increase ease and safety with mobility and ADLs.    Baseline 03/14/21: strength 4+/5 B, greatest deficit in B ankle DFs 4-/5 B    Time 6    Period Weeks    Status New    Target Date 04/25/21               PT Long Term Goals - 03/14/21 1911  PT LONG TERM GOAL #1   Title Patient will increase FOTO score to greater than 59 to demonstrate statistically significant improvement in mobility and quality of life.    Baseline 03/14/21: 58    Time 12    Period Weeks    Status New    Target Date 06/06/21      PT LONG TERM GOAL #2   Title Patient (> 27 years old) will complete five times sit to stand test in < 10 seconds indicating increased LE strength and decreased fall risk    Baseline 03/14/21: 12 seconds    Time 12    Period Weeks    Status New    Target Date 06/06/21      PT LONG TERM GOAL #3   Title Pt will improve DGI by at least 3 points in order to demonstrate clinically significant improvement in balance and decreased risk for falls.    Baseline 03/14/21: 15/24    Time 12    Period Weeks    Status New    Target Date 06/06/21      PT LONG TERM GOAL #4   Title Pt will increase 6MWT by at least 51m (178ft) in order to demonstrate clinically significant improvement in cardiopulmonary endurance and community ambulation    Baseline 03/14/21: 795 ft with QC    Time 12    Period Weeks    Status New    Target Date 06/06/21      PT LONG TERM GOAL #5   Title The patient will be able to maintain >5 seconds of SLB on BLEs in order to decrease risk of falls when navigating obstacles, curbs and steps.    Baseline 03/14/21: RLE 4 seconds, LLE 2 seconds    Time 12    Period Weeks    Status New    Target Date 06/06/21      PT LONG TERM GOAL #6   Title Patient will increase 10 meter walk test to at least 1.0 m/s with LRD as to improve gait speed for better community ambulation and to reduce fall risk.    Baseline 03/14/21: 0.67 m/s    Time 12    Period Weeks     Status New    Target Date 06/06/21                   Plan - 03/28/21 1210     Clinical Impression Statement Pt presented with excellent motivation to participate in session. The pt required intermittent UE support with majority of balance interventions and CGA. He showed improvement with dynamic balance activity in NBOS on compliant surface compared to prior session. Pt still challenged with standing heel raises, performing reps through limited ROM. The pt will benefit from further skilled PT to improve balance, strength, and endurance to increase QOL and decrease fall risk.    Personal Factors and Comorbidities Age;Past/Current Experience;Comorbidity 3+    Comorbidities A-fib, lumbar disc disease, prostate CA, TIA, gout, hx of pulmonary embolism    Examination-Activity Limitations Carry;Lift;Stand;Locomotion Level;Bend;Dressing;Reach Overhead;Squat;Transfers;Caring for Others;Stairs    Examination-Participation Restrictions Community Activity;Yard Work;Cleaning;Laundry;Shop;Church    Stability/Clinical Decision Making Evolving/Moderate complexity    Rehab Potential Good    PT Frequency 2x / week    PT Duration 12 weeks    PT Treatment/Interventions ADLs/Self Care Home Management;Biofeedback;Aquatic Therapy;Canalith Repostioning;Cryotherapy;Electrical Stimulation;Iontophoresis 4mg /ml Dexamethasone;Moist Heat;Traction;Ultrasound;Gait training;Stair training;DME Instruction;Functional mobility training;Therapeutic activities;Therapeutic exercise;Balance training;Neuromuscular re-education;Patient/family education;Manual techniques;Passive range of motion;Dry needling;Energy conservation;Vestibular;Joint Manipulations;Orthotic Fit/Training;Splinting;Taping;Visual/perceptual remediation/compensation  PT Next Visit Plan Progress balance, gait, cardioresp. endurance, strengthening    PT Home Exercise Plan 1/9: Access Code: [email or web address]; no updates    Consulted and Agree with Plan of Care  Patient             Patient will benefit from skilled therapeutic intervention in order to improve the following deficits and impairments:  Abnormal gait, Decreased balance, Decreased range of motion, Decreased strength, Hypomobility, Impaired sensation, Pain, Improper body mechanics, Impaired flexibility, Decreased mobility, Decreased activity tolerance, Decreased endurance, Difficulty walking, Increased muscle spasms, Postural dysfunction  Visit Diagnosis: Unsteadiness on feet  Muscle weakness (generalized)  Other abnormalities of gait and mobility     Problem List Patient Active Problem List   Diagnosis Date Noted   COVID-19 virus infection 02/17/2021   A-fib ([place])    TIA (transient ischemic attack)    Lactic acidosis    Hyponatremia    Hypotension    Generalized weakness     Zollie Pee, PT 03/28/2021, 12:16 PM  [place] [address] [place], [name], [address] Phone: [phone]   Fax:  [phone]  Name: LOYCE KLASEN MRN: [id number] Date of Birth: 1945/06/21

## 2021-04-01 ENCOUNTER — Other Ambulatory Visit: Payer: Self-pay

## 2021-04-01 ENCOUNTER — Ambulatory Visit: Payer: Medicare Other

## 2021-04-01 DIAGNOSIS — R262 Difficulty in walking, not elsewhere classified: Secondary | ICD-10-CM

## 2021-04-01 DIAGNOSIS — R2681 Unsteadiness on feet: Secondary | ICD-10-CM

## 2021-04-01 DIAGNOSIS — M6281 Muscle weakness (generalized): Secondary | ICD-10-CM

## 2021-04-01 DIAGNOSIS — R269 Unspecified abnormalities of gait and mobility: Secondary | ICD-10-CM

## 2021-04-01 NOTE — Therapy (Signed)
[place] MAIN [facility] SERVICES [address] [place], [name], [address] Phone: [phone]   Fax:  [phone]  Physical Therapy Treatment  Patient Details  Name: Nicholas [name] MRN: [id number] Date of Birth: 1946/03/08 Referring Provider (PT): Rusty Aus, MD   Encounter Date: 04/01/2021   PT End of Session - 04/01/21 1507     Visit Number 6    Number of Visits 25    Date for PT Re-Evaluation 06/06/21    Authorization Time Period 03/14/2021-06/06/2021    Progress Note Due on Visit 10    PT Start Time 1101    PT Stop Time 1145    PT Time Calculation (min) 44 min    Equipment Utilized During Treatment Gait belt    Activity Tolerance Patient tolerated treatment well    Behavior During Therapy [facility] for tasks assessed/performed             Past Medical History:  Diagnosis Date   A-fib ([place])    Cancer ([place]) 10/25/2019   Prostate   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   Subjective Assessment - 04/01/21 1505     Subjective Patient reports that he is walking more without his walker at home. Denies any pain or falls.    Pertinent History Pt is a 76 y/o male, returning to PT following admission to ED for evaluation of weakness s/p fall where pt was found to be COVID positive. Pt was in the hospital from 02/17/2021-02/23/2021. Pt was then d/c to home where he received [place] PT. Pt reports increased LBP since d/c from hospital and decreased balance since last in outpatient PT. While pt using QC  currently, he reports he has been using a RW at home or grabbing onto the wall to steady himself while walking. Pt reports he has been walking home distances. PMH significant for TIA, a-fib, pulmonary embolism, TIA, gout, rhabdomyolysis.    Limitations Standing;Walking;House hold activities;Lifting    How long can you sit comfortably? not limited    How long can you stand comfortably? pt reports limited due to feeling unsteady    How long can you walk comfortably? Pt reports he is currently walking household distances    Patient Stated Goals Pt would like to improve his walking and balance    Currently in Pain? No/denies    Pain Onset More than a month ago                Interventions:   2 min step test- 55 steps (counted every right step)  without UE support-  RPE= 4/10 O2 sat=96; HR=114 bpm  Sit to stand Holding onto PVC Pipe  with overhead raise- x 12 reps - RPE = 4/10.   Single leg stance - Multiple attempts 20+ each leg (unable to hold > 4 sec at a time on either leg today)   Standing on airex pad (feet stagggered) - arm reaching across body- reaching for cone on top  of tray table-  back to same side and place cone down on top of steps- *Multiple Loss of balance requiring reaching for UE support- More difficulty reaching to right with left UE.    High knees Marching on blue airex pad without UE support x 12 each leg- VC to slow down for more balance. Increased unsteadiness with slower motion.   Side stepping up/over 1/2 foam roll x 10 reps  Side stepping (standing on 1 airex pad up and over 1/2 foam roll onto another airex pad) x 10 reps  Forward and retro stepping (standing on 1 airex pad up and over 1/2 foam roll onto another airex pad) x 10 reps   Resistive gait - backward with matrix cable system- 12.5 lb. X 5 sets - Initial unsteadiness yet improved with practice.   Gait around cones- forward/backward around 4 cones positioned 3 feet apart. Decreased step length  more with retro gait with some increased unsteadiness.    Education provided throughout session via VC/TC and demonstration to facilitate movement at target joints and correct muscle activation for all testing and exercises performed.               PT Education - 04/01/21 1506     Education Details exercise technique    Person(s) Educated Patient    Methods Explanation;Demonstration;Tactile cues;Verbal cues    Comprehension Verbalized understanding;Returned demonstration;Verbal cues required;Tactile cues required;Need further instruction              PT Short Term Goals - 03/14/21 1910       PT SHORT TERM GOAL #1   Title Patient will be independent in home exercise program to improve balance, and strength/mobility for better functional independence with ADLs and decreased fall risk.    Baseline 03/14/21: to be initiated within next 1-2 sessions    Time 6    Period Weeks    Status New    Target Date 06/06/21      PT SHORT TERM GOAL #2   Title Pt will improve gross BLE strength to 5/5 in order to increase ease and safety with mobility and ADLs.    Baseline 03/14/21: strength 4+/5 B, greatest deficit in B ankle DFs 4-/5 B    Time 6    Period Weeks    Status New    Target Date 04/25/21               PT Long Term Goals - 03/14/21 1911       PT LONG TERM GOAL #1   Title Patient will increase FOTO score to greater than 59 to demonstrate statistically significant improvement in mobility and quality of life.    Baseline 03/14/21: 58    Time 12    Period Weeks    Status New    Target Date 06/06/21      PT LONG TERM GOAL #2   Title Patient (> 35 years old) will complete five times sit to stand test in < 10 seconds indicating increased LE strength and decreased fall risk    Baseline 03/14/21: 12 seconds    Time 12    Period Weeks    Status New    Target Date 06/06/21      PT LONG TERM GOAL #3   Title Pt will improve DGI by at least 3 points in order to  demonstrate clinically significant improvement in balance and decreased risk for falls.    Baseline 03/14/21: 15/24    Time 12  Period Weeks    Status New    Target Date 06/06/21      PT LONG TERM GOAL #4   Title Pt will increase 6MWT by at least 56m (133ft) in order to demonstrate clinically significant improvement in cardiopulmonary endurance and community ambulation    Baseline 03/14/21: 795 ft with QC    Time 12    Period Weeks    Status New    Target Date 06/06/21      PT LONG TERM GOAL #5   Title The patient will be able to maintain >5 seconds of SLB on BLEs in order to decrease risk of falls when navigating obstacles, curbs and steps.    Baseline 03/14/21: RLE 4 seconds, LLE 2 seconds    Time 12    Period Weeks    Status New    Target Date 06/06/21      PT LONG TERM GOAL #6   Title Patient will increase 10 meter walk test to at least 1.0 m/s with LRD as to improve gait speed for better community ambulation and to reduce fall risk.    Baseline 03/14/21: 0.67 m/s    Time 12    Period Weeks    Status New    Target Date 06/06/21                   Plan - 04/01/21 1507     Clinical Impression Statement Patient presents with good motivation today for entire workout. He was challenged today with standing dynamic balance activities and exhibited unsteadiness yet did improve overall. Patient expressed difficulty with retro gait but did well today - able to perform resistive gait and cone work today. The pt will benefit from further skilled PT to improve balance, strength, and endurance to increase QOL and decrease fall risk    Personal Factors and Comorbidities Age;Past/Current Experience;Comorbidity 3+    Comorbidities A-fib, lumbar disc disease, prostate CA, TIA, gout, hx of pulmonary embolism    Examination-Activity Limitations Carry;Lift;Stand;Locomotion Level;Bend;Dressing;Reach Overhead;Squat;Transfers;Caring for Others;Stairs    Examination-Participation Restrictions  Community Activity;Yard Work;Cleaning;Laundry;Shop;Church    Stability/Clinical Decision Making Evolving/Moderate complexity    Rehab Potential Good    PT Frequency 2x / week    PT Duration 12 weeks    PT Treatment/Interventions ADLs/Self Care Home Management;Biofeedback;Aquatic Therapy;Canalith Repostioning;Cryotherapy;Electrical Stimulation;Iontophoresis 4mg /ml Dexamethasone;Moist Heat;Traction;Ultrasound;Gait training;Stair training;DME Instruction;Functional mobility training;Therapeutic activities;Therapeutic exercise;Balance training;Neuromuscular re-education;Patient/family education;Manual techniques;Passive range of motion;Dry needling;Energy conservation;Vestibular;Joint Manipulations;Orthotic Fit/Training;Splinting;Taping;Visual/perceptual remediation/compensation    PT Next Visit Plan Progress balance, gait, cardioresp. endurance, strengthening    PT Home Exercise Plan 1/9: Access Code: [email or web address]; no updates    Consulted and Agree with Plan of Care Patient             Patient will benefit from skilled therapeutic intervention in order to improve the following deficits and impairments:  Abnormal gait, Decreased balance, Decreased range of motion, Decreased strength, Hypomobility, Impaired sensation, Pain, Improper body mechanics, Impaired flexibility, Decreased mobility, Decreased activity tolerance, Decreased endurance, Difficulty walking, Increased muscle spasms, Postural dysfunction  Visit Diagnosis: Abnormality of gait and mobility  Difficulty in walking, not elsewhere classified  Muscle weakness (generalized)  Unsteadiness on feet     Problem List Patient Active Problem List   Diagnosis Date Noted   COVID-19 virus infection 02/17/2021   A-fib ([place])    TIA (transient ischemic attack)    Lactic acidosis    Hyponatremia    Hypotension    Generalized weakness     Lewis Moccasin,  PT 04/01/2021, 5:19 PM  [place] MAIN  [facility] SERVICES [address] [place], [name], [address] Phone: [phone]   Fax:  [phone]  Name: KEMP GOMES MRN: [id number] Date of Birth: 1945/04/13

## 2021-04-02 ENCOUNTER — Ambulatory Visit: Payer: Medicare Other

## 2021-04-03 ENCOUNTER — Ambulatory Visit (INDEPENDENT_AMBULATORY_CARE_PROVIDER_SITE_OTHER): Payer: Medicare Other | Admitting: Dermatology

## 2021-04-03 ENCOUNTER — Other Ambulatory Visit: Payer: Self-pay

## 2021-04-03 DIAGNOSIS — D229 Melanocytic nevi, unspecified: Secondary | ICD-10-CM | POA: Diagnosis not present

## 2021-04-03 DIAGNOSIS — L814 Other melanin hyperpigmentation: Secondary | ICD-10-CM

## 2021-04-03 DIAGNOSIS — Z1283 Encounter for screening for malignant neoplasm of skin: Secondary | ICD-10-CM | POA: Diagnosis not present

## 2021-04-03 DIAGNOSIS — L578 Other skin changes due to chronic exposure to nonionizing radiation: Secondary | ICD-10-CM | POA: Diagnosis not present

## 2021-04-03 DIAGNOSIS — L820 Inflamed seborrheic keratosis: Secondary | ICD-10-CM | POA: Diagnosis not present

## 2021-04-03 DIAGNOSIS — L570 Actinic keratosis: Secondary | ICD-10-CM | POA: Diagnosis not present

## 2021-04-03 DIAGNOSIS — D1800 Hemangioma unspecified site: Secondary | ICD-10-CM

## 2021-04-03 DIAGNOSIS — L821 Other seborrheic keratosis: Secondary | ICD-10-CM

## 2021-04-03 DIAGNOSIS — L988 Other specified disorders of the skin and subcutaneous tissue: Secondary | ICD-10-CM

## 2021-04-03 NOTE — Progress Notes (Signed)
Follow-Up Visit   Subjective  Nicholas [name] is a 76 y.o. male who presents for the following: Follow-up (F/u for AKs treated at last visit with Ln2. Pt also c/o a spot on his left leg that bothers him at night when he is trying to sleep and his skin thinning and wrinkling. ). The patient presents for Upper Body Skin Exam (UBSE) for skin cancer screening and mole check.  The patient has spots, moles and lesions to be evaluated, some may be new or changing and the patient has concerns that these could be cancer.  The following portions of the chart were reviewed this encounter and updated as appropriate:  Tobacco   Allergies   Meds   Problems   Med Hx   Surg Hx   Fam Hx      Review of Systems: No other skin or systemic complaints except as noted in HPI or Assessment and Plan.  Objective  Well appearing patient in no apparent distress; mood and affect are within normal limits.  A focused examination was performed including all skin from the waist up and left leg. Relevant physical exam findings are noted in the Assessment and Plan.  right nose x 1 Erythematous thin papules/macules with gritty scale.   Scalp x 1, left calf x 1 (2) Erythematous keratotic or waxy stuck-on papule or plaque.   hands, ankles Rhytides and volume loss.    Assessment & Plan  AK (actinic keratosis) right nose x 1 Actinic keratoses are precancerous spots that appear secondary to cumulative UV radiation exposure/sun exposure over time. They are chronic with expected duration over 1 year. A portion of actinic keratoses will progress to squamous cell carcinoma of the skin. It is not possible to reliably predict which spots will progress to skin cancer and so treatment is recommended to prevent development of skin cancer.  Recommend daily broad spectrum sunscreen SPF 30+ to sun-exposed areas, reapply every 2 hours as needed.  Recommend staying in the shade or wearing long sleeves, sun glasses (UVA+UVB protection)  and wide brim hats (4-inch brim around the entire circumference of the hat). Call for new or changing lesions.  Prior to procedure, discussed risks of blister formation, small wound, skin dyspigmentation, or rare scar following cryotherapy. Recommend Vaseline ointment to treated areas while healing.  Destruction of lesion - right nose x 1 Complexity: simple   Destruction method: cryotherapy   Informed consent: discussed and consent obtained   Timeout:  patient name, date of birth, surgical site, and procedure verified Lesion destroyed using liquid nitrogen: Yes   Region frozen until ice ball extended beyond lesion: Yes   Outcome: patient tolerated procedure well with no complications   Post-procedure details: wound care instructions given    Inflamed seborrheic keratosis (2) Scalp x 1, left calf x 1 Prior to procedure, discussed risks of blister formation, small wound, skin dyspigmentation, or rare scar following cryotherapy. Recommend Vaseline ointment to treated areas while healing.  Destruction of lesion - Scalp x 1, left calf x 1 Complexity: simple   Destruction method: cryotherapy   Informed consent: discussed and consent obtained   Timeout:  patient name, date of birth, surgical site, and procedure verified Lesion destroyed using liquid nitrogen: Yes   Region frozen until ice ball extended beyond lesion: Yes   Outcome: patient tolerated procedure well with no complications   Post-procedure details: wound care instructions given    Elastosis of skin - Crepey thin wrinkling of the skin hands, ankles  Recommend Alastin body cream with CeraVe over top of it QHS.   Skin cancer screening  Lentigines - Scattered tan macules - Due to sun exposure - Benign-appearing, observe - Recommend daily broad spectrum sunscreen SPF 30+ to sun-exposed areas, reapply every 2 hours as needed. - Call for any changes  Seborrheic Keratoses - Stuck-on, waxy, tan-brown papules and/or plaques  -  Benign-appearing - Discussed benign etiology and prognosis. - Observe - Call for any changes  Melanocytic Nevi - Tan-brown and/or pink-flesh-colored symmetric macules and papules - Benign appearing on exam today - Observation - Call clinic for new or changing moles - Recommend daily use of broad spectrum spf 30+ sunscreen to sun-exposed areas.   Hemangiomas - Red papules - Discussed benign nature - Observe - Call for any changes  Actinic Damage - Chronic condition, secondary to cumulative UV/sun exposure - diffuse scaly erythematous macules with underlying dyspigmentation - Recommend daily broad spectrum sunscreen SPF 30+ to sun-exposed areas, reapply every 2 hours as needed.  - Staying in the shade or wearing long sleeves, sun glasses (UVA+UVB protection) and wide brim hats (4-inch brim around the entire circumference of the hat) are also recommended for sun protection.  - Call for new or changing lesions.  Skin cancer screening performed today.  Return in about 6 months (around 10/01/2021) for UBSE, AK f/u.  IHarriett Sine, CMA, am acting as scribe for Sarina Ser, MD. Documentation: I have reviewed the above documentation for accuracy and completeness, and I agree with the above.  Sarina Ser, MD

## 2021-04-03 NOTE — Patient Instructions (Signed)

## 2021-04-04 ENCOUNTER — Ambulatory Visit: Payer: Medicare Other

## 2021-04-04 DIAGNOSIS — R2681 Unsteadiness on feet: Secondary | ICD-10-CM

## 2021-04-04 DIAGNOSIS — R2689 Other abnormalities of gait and mobility: Secondary | ICD-10-CM

## 2021-04-04 DIAGNOSIS — M6281 Muscle weakness (generalized): Secondary | ICD-10-CM

## 2021-04-04 NOTE — Therapy (Signed)
[place] MAIN [facility] SERVICES [address] [place], [name], [address] Phone: [phone]   Fax:  [phone]  Physical Therapy Treatment  Patient Details  Name: Nicholas [name] MRN: [id number] Date of Birth: 1945-12-08 Referring Provider (PT): Rusty Aus, MD   Encounter Date: 04/04/2021   PT End of Session - 04/04/21 1158     Visit Number 7    Number of Visits 25    Date for PT Re-Evaluation 06/06/21    Authorization Time Period 03/14/2021-06/06/2021    Progress Note Due on Visit 10    PT Start Time 1103    PT Stop Time 1146    PT Time Calculation (min) 43 min    Equipment Utilized During Treatment Gait belt    Activity Tolerance Patient tolerated treatment well    Behavior During Therapy [facility] for tasks assessed/performed             Past Medical History:  Diagnosis Date   A-fib ([place])    Cancer ([place]) 10/25/2019   Prostate   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   Subjective Assessment - 04/04/21 1157     Subjective Pt reports no pain currently. He reports at night his feet feel as though they're about to cramp. He is unsure what is causing this but thinks it may have to do with walking less than usual.    Pertinent History Pt is a 76 y/o male, returning to PT following admission to ED for evaluation of weakness s/p fall where pt was found to be COVID positive. Pt was in the hospital from 02/17/2021-02/23/2021. Pt was then d/c to home where he received [place] PT. Pt reports increased LBP  since d/c from hospital and decreased balance since last in outpatient PT. While pt using QC currently, he reports he has been using a RW at home or grabbing onto the wall to steady himself while walking. Pt reports he has been walking home distances. PMH significant for TIA, a-fib, pulmonary embolism, TIA, gout, rhabdomyolysis.    Limitations Standing;Walking;House hold activities;Lifting    How long can you sit comfortably? not limited    How long can you stand comfortably? pt reports limited due to feeling unsteady    How long can you walk comfortably? Pt reports he is currently walking household distances    Patient Stated Goals Pt would like to improve his walking and balance    Currently in Pain? No/denies    Pain Onset More than a month ago            INTERVENTIONS -  PT instructs pt through warm-up: seated ABCs - 1 round each LE Seated ankle pumps 20x each LE  Alternating LAQ 20x each LE  Treadmill endurance training - pt ambulates up to  1.6 mph, HR >115 mph, pt rates as medium. X 6 min Cuing for heel-toe pattern.  STS 12x hands free  In // bars: gait belt donned and CGA provided unless  otherwise specified  Foot on floor and foot on 12" step for SLB progression - 2x30-45 sec each LE. Intermittent UE support on bar.  --Progressed to counting back from 50s by 2s for dual cog task performed 2 rounds each LE   Tandem walking 6x length of bars --progressed to tandem and semi-tandem gait with dual motor task "don't drop the ice cream." Very challenging for pt. Drops ice cream 1x, increased unsteadiness and intermittent UE support on bar.  Semi-tandem stance 2x30 sec each LE  Lateral stepping on airex beam 6x. Intermittent UE support on bar.   Pt educated throughout session about proper posture and technique with exercises. Improved exercise technique, movement at target joints, use of target muscles after min to mod verbal, visual, tactile cues.     PT Education - 04/04/21  1158     Education Details exercise technique, body mechanics    Person(s) Educated Patient    Methods Explanation;Demonstration;Verbal cues    Comprehension Returned demonstration;Verbalized understanding;Need further instruction;Verbal cues required              PT Short Term Goals - 03/14/21 1910       PT SHORT TERM GOAL #1   Title Patient will be independent in home exercise program to improve balance, and strength/mobility for better functional independence with ADLs and decreased fall risk.    Baseline 03/14/21: to be initiated within next 1-2 sessions    Time 6    Period Weeks    Status New    Target Date 06/06/21      PT SHORT TERM GOAL #2   Title Pt will improve gross BLE strength to 5/5 in order to increase ease and safety with mobility and ADLs.    Baseline 03/14/21: strength 4+/5 B, greatest deficit in B ankle DFs 4-/5 B    Time 6    Period Weeks    Status New    Target Date 04/25/21               PT Long Term Goals - 03/14/21 1911       PT LONG TERM GOAL #1   Title Patient will increase FOTO score to greater than 59 to demonstrate statistically significant improvement in mobility and quality of life.    Baseline 03/14/21: 58    Time 12    Period Weeks    Status New    Target Date 06/06/21      PT LONG TERM GOAL #2   Title Patient (> 74 years old) will complete five times sit to stand test in < 10 seconds indicating increased LE strength and decreased fall risk    Baseline 03/14/21: 12 seconds    Time 12    Period Weeks    Status New    Target Date 06/06/21      PT LONG TERM GOAL #3   Title Pt will improve DGI by at least 3 points in order to demonstrate clinically significant improvement in balance and decreased risk for falls.    Baseline 03/14/21: 15/24    Time 12    Period Weeks    Status New    Target Date 06/06/21      PT LONG TERM GOAL #4   Title Pt will increase 6MWT by at least 67m (141ft) in order to demonstrate clinically significant  improvement in cardiopulmonary endurance and community ambulation    Baseline 03/14/21: 795 ft with QC    Time 12    Period Weeks  Status New    Target Date 06/06/21      PT LONG TERM GOAL #5   Title The patient will be able to maintain >5 seconds of SLB on BLEs in order to decrease risk of falls when navigating obstacles, curbs and steps.    Baseline 03/14/21: RLE 4 seconds, LLE 2 seconds    Time 12    Period Weeks    Status New    Target Date 06/06/21      PT LONG TERM GOAL #6   Title Patient will increase 10 meter walk test to at least 1.0 m/s with LRD as to improve gait speed for better community ambulation and to reduce fall risk.    Baseline 03/14/21: 0.67 m/s    Time 12    Period Weeks    Status New    Target Date 06/06/21                   Plan - 04/04/21 1159     Clinical Impression Statement Pt able to be progressed to treadmill endurance training on this date, indicating increased LE mm and cardioresp. endurance and functional capacity. Pt ambulated up to 1.6 mph and his HR reached >115 bpm. Pt reported intervention as medium. Pt most challenged with dual motor task with balance intervention, and required frequent UE support on bars to regain balance. The pt will benefit from further skilled PT to improve balance, strength, and endurance to increase QOL and decrease fall risk.    Personal Factors and Comorbidities Age;Past/Current Experience;Comorbidity 3+    Comorbidities A-fib, lumbar disc disease, prostate CA, TIA, gout, hx of pulmonary embolism    Examination-Activity Limitations Carry;Lift;Stand;Locomotion Level;Bend;Dressing;Reach Overhead;Squat;Transfers;Caring for Others;Stairs    Examination-Participation Restrictions Community Activity;Yard Work;Cleaning;Laundry;Shop;Church    Stability/Clinical Decision Making Evolving/Moderate complexity    Rehab Potential Good    PT Frequency 2x / week    PT Duration 12 weeks    PT Treatment/Interventions ADLs/Self  Care Home Management;Biofeedback;Aquatic Therapy;Canalith Repostioning;Cryotherapy;Electrical Stimulation;Iontophoresis 4mg /ml Dexamethasone;Moist Heat;Traction;Ultrasound;Gait training;Stair training;DME Instruction;Functional mobility training;Therapeutic activities;Therapeutic exercise;Balance training;Neuromuscular re-education;Patient/family education;Manual techniques;Passive range of motion;Dry needling;Energy conservation;Vestibular;Joint Manipulations;Orthotic Fit/Training;Splinting;Taping;Visual/perceptual remediation/compensation    PT Next Visit Plan Progress balance, gait, cardioresp. endurance, strengthening    PT Home Exercise Plan 1/9: Access Code: [email or web address]; no updates    Consulted and Agree with Plan of Care Patient             Patient will benefit from skilled therapeutic intervention in order to improve the following deficits and impairments:  Abnormal gait, Decreased balance, Decreased range of motion, Decreased strength, Hypomobility, Impaired sensation, Pain, Improper body mechanics, Impaired flexibility, Decreased mobility, Decreased activity tolerance, Decreased endurance, Difficulty walking, Increased muscle spasms, Postural dysfunction  Visit Diagnosis: Muscle weakness (generalized)  Unsteadiness on feet  Other abnormalities of gait and mobility     Problem List Patient Active Problem List   Diagnosis Date Noted   COVID-19 virus infection 02/17/2021   A-fib ([place])    TIA (transient ischemic attack)    Lactic acidosis    Hyponatremia    Hypotension    Generalized weakness     Zollie Pee, PT 04/04/2021, 12:05 PM  [place] MAIN [facility] SERVICES [address] [place], [name], [address] Phone: [phone]   Fax:  [phone]  Name: Nicholas [name] MRN: [id number] Date of Birth: January 03, 1946

## 2021-04-07 ENCOUNTER — Encounter: Payer: Self-pay | Admitting: Dermatology

## 2021-04-08 ENCOUNTER — Other Ambulatory Visit: Payer: Self-pay

## 2021-04-08 ENCOUNTER — Ambulatory Visit: Payer: Medicare Other

## 2021-04-08 DIAGNOSIS — R2681 Unsteadiness on feet: Secondary | ICD-10-CM | POA: Diagnosis not present

## 2021-04-08 DIAGNOSIS — R2689 Other abnormalities of gait and mobility: Secondary | ICD-10-CM

## 2021-04-08 DIAGNOSIS — M6281 Muscle weakness (generalized): Secondary | ICD-10-CM

## 2021-04-08 NOTE — Therapy (Signed)
[place] MAIN [facility] SERVICES [address] [place], [name], [address] Phone: [phone]   Fax:  [phone]  Physical Therapy Treatment  Patient Details  Name: Nicholas [name] MRN: [id number] Date of Birth: 11/25/1945 Referring Provider (PT): Rusty Aus, MD   Encounter Date: 04/08/2021   PT End of Session - 04/08/21 1244     Visit Number 8    Number of Visits 25    Date for PT Re-Evaluation 06/06/21    Authorization Time Period 03/14/2021-06/06/2021    Progress Note Due on Visit 10    PT Start Time 1131    PT Stop Time 1213    PT Time Calculation (min) 42 min    Equipment Utilized During Treatment Gait belt    Activity Tolerance Patient tolerated treatment well    Behavior During Therapy [facility] for tasks assessed/performed             Past Medical History:  Diagnosis Date   A-fib ([place])    Cancer ([place]) 10/25/2019   Prostate   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    Subjective Assessment - 04/08/21 1242     Subjective Pt reports he helped load a truck this past weekend. He reports back pain currently. Pt also reports he had poor sleep last night due to L foot pain.    Pertinent History Pt is a 76 y/o male, returning to PT following admission to ED for evaluation of weakness s/p fall where pt was found to be COVID positive. Pt was in the hospital from 02/17/2021-02/23/2021. Pt was then d/c to home where he received [facility]. Pt reports increased LBP since d/c from hospital and decreased  balance since last in outpatient PT. While pt using QC currently, he reports he has been using a RW at home or grabbing onto the wall to steady himself while walking. Pt reports he has been walking home distances. PMH significant for TIA, a-fib, pulmonary embolism, TIA, gout, rhabdomyolysis.    Limitations Standing;Walking;House hold activities;Lifting    How long can you sit comfortably? not limited    How long can you stand comfortably? pt reports limited due to feeling unsteady    How long can you walk comfortably? Pt reports he is currently walking household distances    Patient Stated Goals Pt would like to improve his walking and balance    Currently in Pain? Yes    Pain Location Back    Pain Orientation Lower    Pain Onset More than a month ago              INTERVENTIONS -  Seated p.ball rollouts forward/backward 10x  Toe crunches with towel- x 2 min BLEs ABCs - 1 round each LE  Treadmill endurance training for LE muscular and cardiorespiratory endurance - pt ambulates up to  1.8 mph, HR reaches max 93-97 bpm, pt rates intervention as medium. X 6.5 min. CGA provided throughout and pt monitored for response  to intervention.    STS 16x hands free. CGA provided throughout. One instance of slight postural instability where pt used step strategy to regain balance.   On airex: basketball intervention for dynamic balance with the following LE positions: Semi-tandem BLEs x 1 round each LE NBOS x 1 round Comments: CGA-min a provided throughout due to decreased postural stability. Pt also relies on intermittent UE support to regain balance.   Pt in lunge position: one foot on floor and one on airex 2x30 sec each LE. Pt reports intervention is challenging. Pt uses intermittent UE support. CGA provided.  Standing heel raises 2x20; Pt continues to be challenged with technique/difficulty with preventing knee flexion B.  Tandem stance 2x30 sec B at support bar. Pt uses intermittent UE  support on bar.  CGA provided.     Pt educated throughout session about proper posture and technique with exercises. Improved exercise technique, movement at target joints, use of target muscles after min to mod verbal, visual, tactile cues.       PT Education - 04/08/21 1244     Education Details exercise technique, body mechanics    Person(s) Educated Patient    Methods Explanation;Demonstration;Verbal cues;Handout    Comprehension Verbalized understanding;Returned demonstration;Need further instruction              PT Short Term Goals - 03/14/21 1910       PT SHORT TERM GOAL #1   Title Patient will be independent in home exercise program to improve balance, and strength/mobility for better functional independence with ADLs and decreased fall risk.    Baseline 03/14/21: to be initiated within next 1-2 sessions    Time 6    Period Weeks    Status New    Target Date 06/06/21      PT SHORT TERM GOAL #2   Title Pt will improve gross BLE strength to 5/5 in order to increase ease and safety with mobility and ADLs.    Baseline 03/14/21: strength 4+/5 B, greatest deficit in B ankle DFs 4-/5 B    Time 6    Period Weeks    Status New    Target Date 04/25/21               PT Long Term Goals - 03/14/21 1911       PT LONG TERM GOAL #1   Title Patient will increase FOTO score to greater than 59 to demonstrate statistically significant improvement in mobility and quality of life.    Baseline 03/14/21: 58    Time 12    Period Weeks    Status New    Target Date 06/06/21      PT LONG TERM GOAL #2   Title Patient (> 25 years old) will complete five times sit to stand test in < 10 seconds indicating increased LE strength and decreased fall risk    Baseline 03/14/21: 12 seconds    Time 12    Period Weeks    Status New    Target Date 06/06/21      PT LONG TERM GOAL #3   Title Pt will improve DGI by at least 3 points in order to demonstrate clinically significant improvement in  balance and decreased risk for falls.    Baseline 03/14/21: 15/24    Time 12    Period Weeks    Status New    Target Date 06/06/21      PT LONG TERM GOAL #4   Title Pt will  increase 6MWT by at least 37m (119ft) in order to demonstrate clinically significant improvement in cardiopulmonary endurance and community ambulation    Baseline 03/14/21: 795 ft with QC    Time 12    Period Weeks    Status New    Target Date 06/06/21      PT LONG TERM GOAL #5   Title The patient will be able to maintain >5 seconds of SLB on BLEs in order to decrease risk of falls when navigating obstacles, curbs and steps.    Baseline 03/14/21: RLE 4 seconds, LLE 2 seconds    Time 12    Period Weeks    Status New    Target Date 06/06/21      PT LONG TERM GOAL #6   Title Patient will increase 10 meter walk test to at least 1.0 m/s with LRD as to improve gait speed for better community ambulation and to reduce fall risk.    Baseline 03/14/21: 0.67 m/s    Time 12    Period Weeks    Status New    Target Date 06/06/21                   Plan - 04/08/21 1250     Clinical Impression Statement Pt continues to progress endurance with increasing mph ambulated on treadmill to 1.8 mph. Pt is also able to tolerate increased volume of heel raises, however, still exhibits compensatory knee flexion B. The pt is still challenged with compliant surface balance training, requiring CGA-min a and intermittent UE suppor to maintain balance. The pt will benefit from further skilled PT to improve balance, strength and endurance to increase QOL and decrease fall risk.    Personal Factors and Comorbidities Age;Past/Current Experience;Comorbidity 3+    Comorbidities A-fib, lumbar disc disease, prostate CA, TIA, gout, hx of pulmonary embolism    Examination-Activity Limitations Carry;Lift;Stand;Locomotion Level;Bend;Dressing;Reach Overhead;Squat;Transfers;Caring for Others;Stairs    Examination-Participation Restrictions Community  Activity;Yard Work;Cleaning;Laundry;Shop;Church    Stability/Clinical Decision Making Evolving/Moderate complexity    Rehab Potential Good    PT Frequency 2x / week    PT Duration 12 weeks    PT Treatment/Interventions ADLs/Self Care Home Management;Biofeedback;Aquatic Therapy;Canalith Repostioning;Cryotherapy;Electrical Stimulation;Iontophoresis 4mg /ml Dexamethasone;Moist Heat;Traction;Ultrasound;Gait training;Stair training;DME Instruction;Functional mobility training;Therapeutic activities;Therapeutic exercise;Balance training;Neuromuscular re-education;Patient/family education;Manual techniques;Passive range of motion;Dry needling;Energy conservation;Vestibular;Joint Manipulations;Orthotic Fit/Training;Splinting;Taping;Visual/perceptual remediation/compensation    PT Next Visit Plan Progress balance, gait, cardioresp. endurance, strengthening    PT Home Exercise Plan 1/9: Access Code: [email or web address]; no updates    Consulted and Agree with Plan of Care Patient             Patient will benefit from skilled therapeutic intervention in order to improve the following deficits and impairments:  Abnormal gait, Decreased balance, Decreased range of motion, Decreased strength, Hypomobility, Impaired sensation, Pain, Improper body mechanics, Impaired flexibility, Decreased mobility, Decreased activity tolerance, Decreased endurance, Difficulty walking, Increased muscle spasms, Postural dysfunction  Visit Diagnosis: Muscle weakness (generalized)  Unsteadiness on feet  Other abnormalities of gait and mobility     Problem List Patient Active Problem List   Diagnosis Date Noted   COVID-19 virus infection 02/17/2021   A-fib ([place])    TIA (transient ischemic attack)    Lactic acidosis    Hyponatremia    Hypotension    Generalized weakness     Zollie Pee, PT 04/08/2021, 12:55 PM  [place] MAIN [facility] SERVICES [address] [place], [name],  [address] Phone: [phone]   Fax:  [phone]  Name: Nicholas [name] MRN: [id number] Date of Birth: 04/23/1945

## 2021-04-09 ENCOUNTER — Ambulatory Visit: Payer: Medicare Other

## 2021-04-11 ENCOUNTER — Other Ambulatory Visit: Payer: Self-pay

## 2021-04-11 ENCOUNTER — Ambulatory Visit: Payer: Medicare Other

## 2021-04-11 ENCOUNTER — Ambulatory Visit: Payer: Medicare Other | Attending: Internal Medicine

## 2021-04-11 DIAGNOSIS — R269 Unspecified abnormalities of gait and mobility: Secondary | ICD-10-CM | POA: Diagnosis present

## 2021-04-11 DIAGNOSIS — R2681 Unsteadiness on feet: Secondary | ICD-10-CM | POA: Diagnosis present

## 2021-04-11 DIAGNOSIS — G8929 Other chronic pain: Secondary | ICD-10-CM | POA: Diagnosis present

## 2021-04-11 DIAGNOSIS — M545 Low back pain, unspecified: Secondary | ICD-10-CM | POA: Insufficient documentation

## 2021-04-11 DIAGNOSIS — R2689 Other abnormalities of gait and mobility: Secondary | ICD-10-CM | POA: Diagnosis present

## 2021-04-11 DIAGNOSIS — R278 Other lack of coordination: Secondary | ICD-10-CM | POA: Diagnosis present

## 2021-04-11 DIAGNOSIS — R262 Difficulty in walking, not elsewhere classified: Secondary | ICD-10-CM | POA: Diagnosis present

## 2021-04-11 DIAGNOSIS — M6281 Muscle weakness (generalized): Secondary | ICD-10-CM | POA: Insufficient documentation

## 2021-04-11 NOTE — Therapy (Signed)
[place] MAIN [facility] SERVICES [address] [place], [name], [address] Phone: [phone]   Fax:  [phone]  Physical Therapy Treatment  Patient Details  Name: Nicholas [name] MRN: [id number] Date of Birth: 1946-03-03 Referring Provider (PT): Rusty Aus, MD   Encounter Date: 04/11/2021   PT End of Session - 04/11/21 1525     Visit Number 9    Number of Visits 25    Date for PT Re-Evaluation 06/06/21    Authorization Time Period 03/14/2021-06/06/2021    Progress Note Due on Visit 10    PT Start Time 1103    PT Stop Time 1146    PT Time Calculation (min) 43 min    Equipment Utilized During Treatment Gait belt    Activity Tolerance Patient tolerated treatment well    Behavior During Therapy [facility] for tasks assessed/performed             Past Medical History:  Diagnosis Date   A-fib ([place])    Cancer ([place]) 10/25/2019   Prostate   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   Subjective Assessment - 04/11/21 1533     Subjective Pt reports no pain currently. No changes since previous appointment.    Pertinent History Pt is a 76 y/o male, returning to PT following admission to ED for evaluation of weakness s/p fall where pt was found to be COVID positive. Pt was in the hospital from 02/17/2021-02/23/2021. Pt was then d/c to home where he received [facility]. Pt reports increased LBP since d/c from hospital and decreased balance since last in outpatient PT. While pt using QC currently, he reports he has  been using a RW at home or grabbing onto the wall to steady himself while walking. Pt reports he has been walking home distances. PMH significant for TIA, a-fib, pulmonary embolism, TIA, gout, rhabdomyolysis.    Limitations Standing;Walking;House hold activities;Lifting    How long can you sit comfortably? not limited    How long can you stand comfortably? pt reports limited due to feeling unsteady    How long can you walk comfortably? Pt reports he is currently walking household distances    Patient Stated Goals Pt would like to improve his walking and balance    Currently in Pain? No/denies    Pain Onset More than a month ago             Subjective Assessment - 04/11/21 1533     Subjective Pt reports no pain currently. No changes since previous appointment.    Pertinent History Pt is a 76 y/o male, returning to PT following admission to ED for evaluation of weakness s/p fall where pt was found to be COVID positive. Pt was in the hospital from 02/17/2021-02/23/2021. Pt was then d/c to home where he received [facility]. Pt reports increased LBP since d/c from hospital and decreased balance since last in outpatient PT. While pt using QC currently, he reports he has  been using a RW at home or grabbing onto the wall to steady himself while walking. Pt reports he has been walking home distances. PMH significant for TIA, a-fib, pulmonary embolism, TIA, gout, rhabdomyolysis.    Limitations Standing;Walking;House hold activities;Lifting    How long can you sit comfortably? not limited    How long can you stand comfortably? pt reports limited due to feeling unsteady    How long can you walk comfortably? Pt reports he is currently walking household distances    Patient Stated Goals Pt would like to improve his walking and balance    Currently in Pain? No/denies    Pain Onset More than a month ago             INTERVENTIONS -   Treadmill endurance training for LE muscular and cardiorespiratory  endurance - pt ambulates up to  1.9 mph, HR reaches max 103bpm, pt rates intervention as medium. X 6 min. CGA provided throughout and pt monitored for response to intervention. Pt observed to fatigue with intervention, required cuing for proximity to handrails for safety with intervention and to decrease BUE weightbearing.   STS with 5000 gr ball 11x, 9x hands -free. CGA provided throughout. 1x decreased postural instability where pt used step strategy to regain balance. Pt rates intervention as medium. Pt requires recovery intervals.   Standing heel raises 2x21 B; Pt rates as hard.   Airex beam lateral stepping 8x; patient requires intermittent upper extremity support and guard assist to min assist maintain balance.  Pt in lunge position: one foot on airex and one on 6" step 2x30 sec each LE. Pt reports intervention is challenging. Pt uses intermittent UE support. CGA to min assist provided.  Lateral stepping onto and off of airex pad 10x each direction.  Patient requires intermittent upper extremity support to maintain balance.  PT provides contact-guard assist.  Stepping laterally over hurdle 10 times each direction.  Patient requires intermittent upper extremity support and contact-guard assist  Forward and backward stepping over hurdle 10 times each direction.  Patient requires intermittent upper extremity support and contact-guard assist.  Patient exhibits greatest difficulty with backward step requiring step strategy to maintain balance.   Note: Portions of this document were prepared using Dragon voice recognition software and although reviewed may contain unintentional dictation errors in syntax, grammar, or spelling.      Pt educated throughout session about proper posture and technique with exercises. Improved exercise technique, movement at target joints, use of target muscles after min to mod verbal, visual, tactile cues.       PT Education - 04/11/21 1525     Education  Details Exercise technique    Person(s) Educated Patient    Methods Explanation;Demonstration;Verbal cues    Comprehension Returned demonstration;Verbal cues required;Verbalized understanding              PT Short Term Goals - 03/14/21 1910       PT SHORT TERM GOAL #1   Title Patient will be independent in home exercise program to improve balance, and strength/mobility for better functional independence with ADLs and decreased fall risk.    Baseline 03/14/21: to be initiated within next 1-2 sessions    Time 6    Period Weeks    Status New    Target Date 06/06/21      PT SHORT TERM GOAL #2   Title Pt will improve gross BLE strength to 5/5 in order to increase ease and safety with mobility and ADLs.  Baseline 03/14/21: strength 4+/5 B, greatest deficit in B ankle DFs 4-/5 B    Time 6    Period Weeks    Status New    Target Date 04/25/21               PT Long Term Goals - 03/14/21 1911       PT LONG TERM GOAL #1   Title Patient will increase FOTO score to greater than 59 to demonstrate statistically significant improvement in mobility and quality of life.    Baseline 03/14/21: 58    Time 12    Period Weeks    Status New    Target Date 06/06/21      PT LONG TERM GOAL #2   Title Patient (> 71 years old) will complete five times sit to stand test in < 10 seconds indicating increased LE strength and decreased fall risk    Baseline 03/14/21: 12 seconds    Time 12    Period Weeks    Status New    Target Date 06/06/21      PT LONG TERM GOAL #3   Title Pt will improve DGI by at least 3 points in order to demonstrate clinically significant improvement in balance and decreased risk for falls.    Baseline 03/14/21: 15/24    Time 12    Period Weeks    Status New    Target Date 06/06/21      PT LONG TERM GOAL #4   Title Pt will increase 6MWT by at least 79m (159ft) in order to demonstrate clinically significant improvement in cardiopulmonary endurance and community ambulation     Baseline 03/14/21: 795 ft with QC    Time 12    Period Weeks    Status New    Target Date 06/06/21      PT LONG TERM GOAL #5   Title The patient will be able to maintain >5 seconds of SLB on BLEs in order to decrease risk of falls when navigating obstacles, curbs and steps.    Baseline 03/14/21: RLE 4 seconds, LLE 2 seconds    Time 12    Period Weeks    Status New    Target Date 06/06/21      PT LONG TERM GOAL #6   Title Patient will increase 10 meter walk test to at least 1.0 m/s with LRD as to improve gait speed for better community ambulation and to reduce fall risk.    Baseline 03/14/21: 0.67 m/s    Time 12    Period Weeks    Status New    Target Date 06/06/21                   Plan - 04/11/21 1530     Clinical Impression Statement Patient highly motivated to participate in session.  He progressed endurance training on treadmill to 1.9 mph, but was noticeably fatigued by end of intervention and required recovery interval.  The patient was most challenged with backward stepping over a hurdle, indicating decreased foot clearance and control with retro stepping.  Patient required contact-guard assist to min assist throughout.  Patient will benefit from further skilled PT to improve strength, endurance, and balance to increase quality of life and decrease fall risk.    Personal Factors and Comorbidities Age;Past/Current Experience;Comorbidity 3+    Comorbidities A-fib, lumbar disc disease, prostate CA, TIA, gout, hx of pulmonary embolism    Examination-Activity Limitations Carry;Lift;Stand;Locomotion Level;Bend;Dressing;Reach Overhead;Squat;Transfers;Caring for [employer]  Examination-Participation Restrictions Community Activity;Yard Work;Cleaning;Laundry;Shop;Church    Stability/Clinical Decision Making Evolving/Moderate complexity    Rehab Potential Good    PT Frequency 2x / week    PT Duration 12 weeks    PT Treatment/Interventions ADLs/Self Care Home  Management;Biofeedback;Aquatic Therapy;Canalith Repostioning;Cryotherapy;Electrical Stimulation;Iontophoresis 4mg /ml Dexamethasone;Moist Heat;Traction;Ultrasound;Gait training;Stair training;DME Instruction;Functional mobility training;Therapeutic activities;Therapeutic exercise;Balance training;Neuromuscular re-education;Patient/family education;Manual techniques;Passive range of motion;Dry needling;Energy conservation;Vestibular;Joint Manipulations;Orthotic Fit/Training;Splinting;Taping;Visual/perceptual remediation/compensation    PT Next Visit Plan Progress balance, gait, cardioresp. endurance, strengthening    PT Home Exercise Plan 1/9: Access Code: [email or web address]; no updates    Consulted and Agree with Plan of Care Patient             Patient will benefit from skilled therapeutic intervention in order to improve the following deficits and impairments:  Abnormal gait, Decreased balance, Decreased range of motion, Decreased strength, Hypomobility, Impaired sensation, Pain, Improper body mechanics, Impaired flexibility, Decreased mobility, Decreased activity tolerance, Decreased endurance, Difficulty walking, Increased muscle spasms, Postural dysfunction  Visit Diagnosis: Unsteadiness on feet  Muscle weakness (generalized)  Other abnormalities of gait and mobility     Problem List Patient Active Problem List   Diagnosis Date Noted   COVID-19 virus infection 02/17/2021   A-fib ([place])    TIA (transient ischemic attack)    Lactic acidosis    Hyponatremia    Hypotension    Generalized weakness     Zollie Pee, PT 04/11/2021, 3:33 PM  [place] [address] [place], [name], [address] Phone: [phone]   Fax:  [phone]  Name: Nicholas [name] MRN: [id number] Date of Birth: [birth year]

## 2021-04-15 ENCOUNTER — Ambulatory Visit: Payer: Medicare Other

## 2021-04-15 ENCOUNTER — Other Ambulatory Visit: Payer: Self-pay

## 2021-04-15 DIAGNOSIS — M6281 Muscle weakness (generalized): Secondary | ICD-10-CM

## 2021-04-15 DIAGNOSIS — R2689 Other abnormalities of gait and mobility: Secondary | ICD-10-CM

## 2021-04-15 DIAGNOSIS — R2681 Unsteadiness on feet: Secondary | ICD-10-CM | POA: Diagnosis not present

## 2021-04-15 NOTE — Therapy (Signed)
[place] MAIN [facility] SERVICES [address] [place], [name], [address] Phone: [phone]   Fax:  [phone]  Physical Therapy Treatment/Physical Therapy Progress Note   Dates of reporting period  03/14/21   to   04/15/21   Patient Details  Name: Nicholas [name] MRN: [id number] Date of Birth: [birth year] Referring Provider (PT): Rusty Aus, MD   Encounter Date: 04/15/2021   PT End of Session - 04/15/21 1015     Visit Number 10    Number of Visits 25    Date for PT Re-Evaluation 06/06/21    Authorization Type next session 1/10 PN 2/6    Authorization Time Period 03/14/2021-06/06/2021    Progress Note Due on Visit 10    PT Start Time 1015    PT Stop Time 1059    PT Time Calculation (min) 44 min    Equipment Utilized During Treatment Gait belt    Activity Tolerance Patient tolerated treatment well    Behavior During Therapy [facility] for tasks assessed/performed             Past Medical History:  Diagnosis Date   A-fib ([place])    Cancer ([place]) 10/25/2019   Prostate   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       [facility] PT Assessment - 04/15/21 0001       Dynamic Gait Index   Level Surface Normal    Change in Gait Speed Mild Impairment    Gait with Horizontal Head Turns Mild Impairment    Gait with Vertical Head Turns Mild Impairment    Gait and Pivot Turn Mild Impairment    Step Over Obstacle Mild Impairment    Step Around Obstacles Mild Impairment    Steps Mild Impairment    Total Score  17              Progress note Goals:  FOTO: 57% 5x STS 13.44 seconds; second attempt 12 seconds with one posterior LOB DGI: 17/24  6 MWT: 865 ft  5 seconds SLS: L 3 seconds R 4 seconds  10 MWT: 7.7 seconds =1.3 m/s  Treatment:   Stepping laterally over hurdle 12 times each direction.  Patient requires intermittent upper extremity support and contact-guard assist   Forward and backward stepping over hurdle 12 times each direction.  Patient requires intermittent upper extremity support and contact-guard assist.  Patient exhibits greatest difficulty with backward step requiring step strategy to maintain balance.  Airex pad dynadisc modified tandem stance 2x 30 second holds   SLS with UE support 30 seconds each LE; 2 sets each LE  Patient's condition has the potential to improve in response to therapy. Maximum improvement is yet to be obtained. The anticipated improvement is attainable and reasonable in a generally predictable time.  Patient reports he is improving but not yet where he would like to be. Still has issues with balance and walking.     Patient demonstrates progression of 6 minute  walk test and DGI. He met his 10 MWT goal but did not make progress with his FOTO or 5x STS goal. Patient is challenged with single limb stability as well as ambulation with head turns. Patient's condition has the potential to improve in response to therapy. Maximum improvement is yet to be obtained. The anticipated improvement is attainable and reasonable in a generally predictable time. Patient will benefit from further skilled PT to improve strength, endurance, and balance to increase quality of life and decrease fall risk             PT Education - 04/15/21 1014     Education Details progress note    Person(s) Educated Patient    Methods Explanation;Demonstration    Comprehension Verbalized understanding;Returned demonstration              PT Short Term Goals - 04/15/21  1042       PT SHORT TERM GOAL #1   Title Patient will be independent in home exercise program to improve balance, and strength/mobility for better functional independence with ADLs and decreased fall risk.    Baseline 03/14/21: to be initiated within next 1-2 sessions 2/6: HEP compliance    Time 6    Period Weeks    Status Achieved    Target Date 06/06/21      PT SHORT TERM GOAL #2   Title Pt will improve gross BLE strength to 5/5 in order to increase ease and safety with mobility and ADLs.    Baseline 03/14/21: strength 4+/5 B, greatest deficit in B ankle DFs 4-/5 B 2/6: grossly 4+/5 with df 4/5    Time 6    Period Weeks    Status Partially Met    Target Date 04/25/21               PT Long Term Goals - 04/15/21 1020       PT LONG TERM GOAL #1   Title Patient will increase FOTO score to greater than 59 to demonstrate statistically significant improvement in mobility and quality of life.    Baseline 03/14/21: 58 2/6: 57%    Time 12    Period Weeks    Status On-going    Target Date 06/06/21      PT LONG TERM GOAL #2   Title Patient (> 67 years old) will complete five times sit to stand test in < 10 seconds indicating increased LE strength and decreased fall risk    Baseline 03/14/21: 12 seconds 2/6: 12 seconds    Time 12    Period Weeks    Status On-going    Target Date 06/06/21      PT LONG TERM GOAL #3   Title Pt will improve DGI by at least 3 points in order to demonstrate clinically significant improvement in balance and decreased risk for falls.    Baseline 03/14/21: 15/24 2/6: 17/24    Time 12    Period Weeks    Status Partially Met    Target Date 06/06/21      PT LONG TERM GOAL #4   Title Pt will increase 6MWT by at least 77m(1619f in order to demonstrate clinically significant improvement in cardiopulmonary endurance and community ambulation    Baseline 03/14/21: 795 ft with QC 2/6: 865 ft without AD    Time 12    Period Weeks    Status Partially Met    Target  Date 06/06/21      PT LONG TERM  GOAL #5   Title The patient will be able to maintain >5 seconds of SLB on BLEs in order to decrease risk of falls when navigating obstacles, curbs and steps.    Baseline 03/14/21: RLE 4 seconds, LLE 2 seconds 2/6: L 3 seconds R 4 seconds    Time 12    Period Weeks    Status On-going    Target Date 06/06/21      PT LONG TERM GOAL #6   Title Patient will increase 10 meter walk test to at least 1.0 m/s with LRD as to improve gait speed for better community ambulation and to reduce fall risk.    Baseline 03/14/21: 0.67 m/s 2/6: 1.3 m/s    Time 12    Period Weeks    Status Achieved    Target Date 06/06/21                   Plan - 04/15/21 1103     Clinical Impression Statement Patient demonstrates progression of 6 minute walk test and DGI. He met his 10 MWT goal but did not make progress with his FOTO or 5x STS goal. Patient is challenged with single limb stability as well as ambulation with head turns. Patient's condition has the potential to improve in response to therapy. Maximum improvement is yet to be obtained. The anticipated improvement is attainable and reasonable in a generally predictable time. Patient will benefit from further skilled PT to improve strength, endurance, and balance to increase quality of life and decrease fall risk    Personal Factors and Comorbidities Age;Past/Current Experience;Comorbidity 3+    Comorbidities A-fib, lumbar disc disease, prostate CA, TIA, gout, hx of pulmonary embolism    Examination-Activity Limitations Carry;Lift;Stand;Locomotion Level;Bend;Dressing;Reach Overhead;Squat;Transfers;Caring for Others;Stairs    Examination-Participation Restrictions Community Activity;Yard Work;Cleaning;Laundry;Shop;Church    Stability/Clinical Decision Making Evolving/Moderate complexity    Rehab Potential Good    PT Frequency 2x / week    PT Duration 12 weeks    PT Treatment/Interventions ADLs/Self Care Home  Management;Biofeedback;Aquatic Therapy;Canalith Repostioning;Cryotherapy;Electrical Stimulation;Iontophoresis 9m/ml Dexamethasone;Moist Heat;Traction;Ultrasound;Gait training;Stair training;DME Instruction;Functional mobility training;Therapeutic activities;Therapeutic exercise;Balance training;Neuromuscular re-education;Patient/family education;Manual techniques;Passive range of motion;Dry needling;Energy conservation;Vestibular;Joint Manipulations;Orthotic Fit/Training;Splinting;Taping;Visual/perceptual remediation/compensation    PT Next Visit Plan Progress balance, gait, cardioresp. endurance, strengthening    PT Home Exercise Plan 1/9: Access Code: [email or web address] no updates    Consulted and Agree with Plan of Care Patient             Patient will benefit from skilled therapeutic intervention in order to improve the following deficits and impairments:  Abnormal gait, Decreased balance, Decreased range of motion, Decreased strength, Hypomobility, Impaired sensation, Pain, Improper body mechanics, Impaired flexibility, Decreased mobility, Decreased activity tolerance, Decreased endurance, Difficulty walking, Increased muscle spasms, Postural dysfunction  Visit Diagnosis: Unsteadiness on feet  Muscle weakness (generalized)  Other abnormalities of gait and mobility     Problem List Patient Active Problem List   Diagnosis Date Noted   COVID-19 virus infection 02/17/2021   [place] ([place]    TIA (transient ischemic attack)    Lactic acidosis    Hyponatremia    Hypotension    Generalized weakness     MJanna Arch PT, DPT  04/15/2021, 11:03 AM  [place] [place] [address]Phone: [phone]  Fax:  [phone] Name: Nicholas MATNEYMRN: [id number] of Birth: 111/20/1947

## 2021-04-18 ENCOUNTER — Ambulatory Visit: Payer: Medicare Other

## 2021-04-18 ENCOUNTER — Other Ambulatory Visit: Payer: Self-pay

## 2021-04-18 DIAGNOSIS — R2689 Other abnormalities of gait and mobility: Secondary | ICD-10-CM

## 2021-04-18 DIAGNOSIS — R2681 Unsteadiness on feet: Secondary | ICD-10-CM

## 2021-04-18 DIAGNOSIS — M6281 Muscle weakness (generalized): Secondary | ICD-10-CM

## 2021-04-18 NOTE — Therapy (Signed)
[place] MAIN [facility] SERVICES [place], [name], [address] Phone: [phone]   Fax:  [phone]  Physical Therapy Treatment  Patient Details  Name: Nicholas [name] MRN: [id number] Date of Birth: January 16, 1946 Referring Provider (PT): Rusty Aus, MD   Encounter Date: 04/18/2021   PT End of Session - 04/18/21 1202     Visit Number 11    Number of Visits 25    Date for PT Re-Evaluation 06/06/21    Authorization Type next session 1/10 PN 2/6    Authorization Time Period 03/14/2021-06/06/2021    Progress Note Due on Visit 10    PT Start Time 1102    PT Stop Time 1146    PT Time Calculation (min) 44 min    Equipment Utilized During Treatment Gait belt    Activity Tolerance Patient tolerated treatment well    Behavior During Therapy [facility] for tasks assessed/performed             Past Medical History:  Diagnosis Date   A-fib ([place])    Cancer ([place]) 10/25/2019   Prostate   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   Subjective Assessment - 04/18/21 1201     Subjective Pt repors no pain at the moment. Pt reports he has been using his walking stick due to feeling unsteady.    Pertinent History Pt is a 76 y/o male, returning to PT following admission to ED for evaluation of weakness s/p fall where pt was found to be COVID positive. Pt was in the hospital from 02/17/2021-02/23/2021. Pt was then d/c to home where he received [place] PT. Pt reports increased LBP since d/c from hospital and decreased  balance since last in outpatient PT. While pt using QC currently, he reports he has been using a RW at home or grabbing onto the wall to steady himself while walking. Pt reports he has been walking home distances. PMH significant for TIA, a-fib, pulmonary embolism, TIA, gout, rhabdomyolysis.    Limitations Standing;Walking;House hold activities;Lifting    How long can you sit comfortably? not limited    How long can you stand comfortably? pt reports limited due to feeling unsteady    How long can you walk comfortably? Pt reports he is currently walking household distances    Patient Stated Goals Pt would like to improve his walking and balance    Currently in Pain? No/denies    Pain Onset More than a month ago            INTERVENTIONS -     Treadmill endurance training for LE muscular and cardiorespiratory endurance - pt ambulates up to  2.1 mph, HR reaches max 93 bpm, spending over half of time over 1.5 mph. Pt rates intervention as medium. X 7 min. CGA provided throughout and pt monitored for response to intervention. Cuing for proximity to handrails for safety with intervention and heavy BUE weightbearing noted.  Resisted walking forward/backward at [employer].  12.5# x 12 reps 17.5# x 10 reps Difficulty with retro-stepping, uses step-strategy. PT provides close CGA.  Standing heel raises 2x26; Pt rates as hard.    Lunge on airex and 6" step 2x30-40 sec each LE; pt rates as hard. Intermittent UE support on bar and min a provided.  At support surface- Pt standing on airex, NBOS, at board, reaching side to side to spell out alphabet x 6 min; rates easy, mild unsteadiness observed.   At support surface - Pt stands on half-foam in DF and PF. Pt performs each position in the following progression: WBOS to NBOS, to vertical and then horizontal head turns x several minutes. Most challenged with DF position Pt uses intermittent UE support throughout and up to mod assist to  maintain balance.  A support surface- Standing on airex, NBOS, EC - 3x60 sec. Pt uses intermittent UE support and min assist to maintain balance.   Pt educated throughout session about proper posture and technique with exercises. Improved exercise technique, movement at target joints, use of target muscles after min to mod verbal, visual, tactile cues.   PT Short Term Goals - 04/15/21 1042       PT SHORT TERM GOAL #1   Title Patient will be independent in home exercise program to improve balance, and strength/mobility for better functional independence with ADLs and decreased fall risk.    Baseline 03/14/21: to be initiated within next 1-2 sessions 2/6: HEP compliance    Time 6    Period Weeks    Status Achieved    Target Date 06/06/21      PT SHORT TERM GOAL #2   Title Pt will improve gross BLE strength to 5/5 in order to increase ease and safety with mobility and ADLs.    Baseline 03/14/21: strength 4+/5 B, greatest deficit in B ankle DFs 4-/5 B 2/6: grossly 4+/5 with df 4/5    Time 6    Period Weeks    Status Partially Met    Target Date 04/25/21               PT Long Term Goals - 04/15/21 1020       PT LONG TERM GOAL #1   Title Patient will increase FOTO score to greater than 59 to demonstrate statistically significant improvement in mobility and quality of life.    Baseline 03/14/21: 58 2/6: 57%    Time 12    Period Weeks    Status On-going    Target Date 06/06/21      PT LONG TERM GOAL #2   Title Patient (> 83 years old) will complete five times sit to stand test in < 10 seconds indicating increased LE strength and decreased fall risk    Baseline 03/14/21: 12 seconds 2/6: 12 seconds    Time 12    Period Weeks    Status On-going    Target Date 06/06/21      PT LONG TERM GOAL #3   Title Pt will improve DGI by at least 3 points in order to demonstrate clinically significant improvement in balance and decreased risk for falls.    Baseline 03/14/21: 15/24 2/6: 17/24     Time 12    Period Weeks    Status Partially Met    Target Date 06/06/21      PT LONG TERM GOAL #4   Title Pt will increase 6MWT by at least 56m(1665f in order to demonstrate clinically significant improvement in cardiopulmonary endurance and  community ambulation    Baseline 03/14/21: 795 ft with QC 2/6: 865 ft without AD    Time 12    Period Weeks    Status Partially Met    Target Date 06/06/21      PT LONG TERM GOAL #5   Title The patient will be able to maintain >5 seconds of SLB on BLEs in order to decrease risk of falls when navigating obstacles, curbs and steps.    Baseline 03/14/21: RLE 4 seconds, LLE 2 seconds 2/6: L 3 seconds R 4 seconds    Time 12    Period Weeks    Status On-going    Target Date 06/06/21      PT LONG TERM GOAL #6   Title Patient will increase 10 meter walk test to at least 1.0 m/s with LRD as to improve gait speed for better community ambulation and to reduce fall risk.    Baseline 03/14/21: 0.67 m/s 2/6: 1.3 m/s    Time 12    Period Weeks    Status Achieved    Target Date 06/06/21                   Plan - 04/18/21 1203     Clinical Impression Statement Pt highly motivated to participate in session. He responded well to majority of interventions without pain. Pt with mild back pain with DF on half-foam, however, this resolved with seated rest and was not present by end of session. Pt able to progress with both strength and balance interventions. Pt had the most difficulty maintaining balance in a DF biased position. The pt will benefit from further skilled PT to improve strength, endurance and balance to increase QOL and decrease fall risk.    Personal Factors and Comorbidities Age;Past/Current Experience;Comorbidity 3+    Comorbidities A-fib, lumbar disc disease, prostate CA, TIA, gout, hx of pulmonary embolism    Examination-Activity Limitations Carry;Lift;Stand;Locomotion Level;Bend;Dressing;Reach Overhead;Squat;Transfers;Caring for Others;Stairs     Examination-Participation Restrictions Community Activity;Yard Work;Cleaning;Laundry;Shop;Church    Stability/Clinical Decision Making Evolving/Moderate complexity    Rehab Potential Good    PT Frequency 2x / week    PT Duration 12 weeks    PT Treatment/Interventions ADLs/Self Care Home Management;Biofeedback;Aquatic Therapy;Canalith Repostioning;Cryotherapy;Electrical Stimulation;Iontophoresis 82m/ml Dexamethasone;Moist Heat;Traction;Ultrasound;Gait training;Stair training;DME Instruction;Functional mobility training;Therapeutic activities;Therapeutic exercise;Balance training;Neuromuscular re-education;Patient/family education;Manual techniques;Passive range of motion;Dry needling;Energy conservation;Vestibular;Joint Manipulations;Orthotic Fit/Training;Splinting;Taping;Visual/perceptual remediation/compensation    PT Next Visit Plan Progress balance, gait, cardioresp. endurance, strengthening    PT Home Exercise Plan 1/9: Access Code: [email or web address] no updates    Consulted and Agree with Plan of Care Patient             Patient will benefit from skilled therapeutic intervention in order to improve the following deficits and impairments:  Abnormal gait, Decreased balance, Decreased range of motion, Decreased strength, Hypomobility, Impaired sensation, Pain, Improper body mechanics, Impaired flexibility, Decreased mobility, Decreased activity tolerance, Decreased endurance, Difficulty walking, Increased muscle spasms, Postural dysfunction  Visit Diagnosis: Unsteadiness on feet  Other abnormalities of gait and mobility  Muscle weakness (generalized)     Problem List Patient Active Problem List   Diagnosis Date Noted   COVID-19 virus infection 02/17/2021   [place] ([place]    TIA (transient ischemic attack)    Lactic acidosis    Hyponatremia    Hypotension    Generalized weakness     HZollie Pee PT 04/18/2021, 12:05 PM  [facility] [address]  [name], [address] Phone: [phone]   Fax:  [phone]  Name: Nicholas [name] MRN: [id number] Date of Birth: 1945/10/23

## 2021-04-22 ENCOUNTER — Ambulatory Visit: Payer: Medicare Other

## 2021-04-22 ENCOUNTER — Ambulatory Visit: Payer: Medicare Other | Admitting: Physical Therapy

## 2021-04-22 ENCOUNTER — Other Ambulatory Visit: Payer: Self-pay

## 2021-04-22 ENCOUNTER — Encounter: Payer: Self-pay | Admitting: Physical Therapy

## 2021-04-22 DIAGNOSIS — R2681 Unsteadiness on feet: Secondary | ICD-10-CM

## 2021-04-22 DIAGNOSIS — G8929 Other chronic pain: Secondary | ICD-10-CM

## 2021-04-22 DIAGNOSIS — R269 Unspecified abnormalities of gait and mobility: Secondary | ICD-10-CM

## 2021-04-22 DIAGNOSIS — R2689 Other abnormalities of gait and mobility: Secondary | ICD-10-CM

## 2021-04-22 DIAGNOSIS — M6281 Muscle weakness (generalized): Secondary | ICD-10-CM

## 2021-04-22 DIAGNOSIS — R262 Difficulty in walking, not elsewhere classified: Secondary | ICD-10-CM

## 2021-04-22 NOTE — Patient Instructions (Signed)
Access Code: [email or web address] URL: [email or web address] Date: 04/22/2021 Prepared by: Blanche East  Exercises Seated Great Toe Extension - 1 x daily - 7 x weekly - 2 sets - 10 reps Seated Lesser Toes Extension - 1 x daily - 7 x weekly - 2 sets - 10 reps Toe Spreading - 1 x daily - 7 x weekly - 2 sets - 10 reps

## 2021-04-22 NOTE — Therapy (Signed)
[place] MAIN [facility] SERVICES [place], [name], [address] Phone: [phone]   Fax:  [phone]  Physical Therapy Treatment  Patient Details  Name: Nicholas [name] MRN: [id number] Date of Birth: 10-16-1945 Referring Provider (PT): Rusty Aus, MD   Encounter Date: 04/22/2021   PT End of Session - 04/22/21 1011     Visit Number 12    Number of Visits 25    Date for PT Re-Evaluation 06/06/21    Authorization Type next session 1/10 PN 2/6    Authorization Time Period 03/14/2021-06/06/2021    Progress Note Due on Visit 10    PT Start Time 1015    PT Stop Time 1100    PT Time Calculation (min) 45 min    Equipment Utilized During Treatment Gait belt    Activity Tolerance Patient tolerated treatment well    Behavior During Therapy [facility] for tasks assessed/performed             Past Medical History:  Diagnosis Date   A-fib ([place])    Cancer ([place]) 10/25/2019   Prostate   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          INTERVENTIONS -     Treadmill endurance training for LE muscular and cardiorespiratory endurance - pt ambulates interval training 2.0 mph for 1 min, 1.5 mph for 30 sec recovery x5 min total, required CGA for safety and cues for erect posture and increased step length; He does exhibit increased foot drop/slap and flexed posture. Patient reports experiencing flexed posture due to low rail of treadmill;  Vitals assessed, HR in high 90's low 100 following treadmill  exercise;   Instructed patient in lumbosacral/hip ROM to reduce stiffness: Hooklying: Feet apart: windshield wiper trunk rotation x10 reps each direction, required min VCs for proper positioning/set up;   Patient in sidelying: Bow and arrow upper trunk rotation x10 reps each direction, required therapist assist to keep pelvic in neutral for better thoracic rotation;   Patient in hooklying: Repeated windshield wiper trunk rotation x10 reps, with increased tightness noted on RLE: PT Performed passive knee to chest stretch with passive circles clockwise/counterclockwise x5 reps each;   Patient required min A to transition to sitting Upon standing, he reports minimal stiffness but does report mild low back discomfort;     At support surface - Pt stands on half-foam in DF and PF.  -heel/toe rock x10 reps; -unsupported in neutral position with feet apart: Head turns side/side x5 reps Progress to trunk rotation x5 reps with increased unsteadiness and posterior loss of balance with decreased ankle strategies Often patient will lose balance posteriorly and has difficulty self correcting, reaching out for support bar to pull self up to neutral position;  Mini squat x10 reps; Required min A for safety and min VCs for proper positioning/exercise technique;   HEP: PT instructed patient in seated toe extension x10 reps;  Instructed patient to work on extending all 5 toes  and then with increased repetition, work on separating great toe from lesser toes for better motor control; Also instructed patient in splaying toes Patient educated how improved toe extension could help improve balance recovery with less posterior loss of balance;    Pt educated throughout session about proper posture and technique with exercises. Improved exercise technique, movement at target joints, use of target muscles after min to mod verbal, visual, tactile cues.   Tolerated session well. Does report fatigue at end of  session. Denies any increase in pain. "My legs feel like jello."                        PT Education - 04/22/21 1010     Education Details exercise technique/positioning;    Person(s) Educated Patient    Methods Explanation;Verbal cues    Comprehension Returned demonstration;Verbalized understanding;Verbal cues required;Need further instruction              PT Short Term Goals - 04/15/21 1042       PT SHORT TERM GOAL #1   Title Patient will be independent in home exercise program to improve balance, and strength/mobility for better functional independence with ADLs and decreased fall risk.    Baseline 03/14/21: to be initiated within next 1-2 sessions 2/6: HEP compliance    Time 6    Period Weeks    Status Achieved    Target Date 06/06/21      PT SHORT TERM GOAL #2   Title Pt will improve gross BLE strength to 5/5 in order to increase ease and safety with mobility and ADLs.    Baseline 03/14/21: strength 4+/5 B, greatest deficit in B ankle DFs 4-/5 B 2/6: grossly 4+/5 with df 4/5    Time 6    Period Weeks    Status Partially Met    Target Date 04/25/21               PT Long Term Goals - 04/15/21 1020       PT LONG TERM GOAL #1   Title Patient will increase FOTO score to greater than 59 to demonstrate statistically significant improvement in mobility and quality of life.    Baseline 03/14/21: 58 2/6: 57%    Time 12    Period Weeks    Status On-going    Target Date 06/06/21      PT LONG TERM GOAL #2   Title Patient (> 10 years old) will complete five times sit to stand test in < 10 seconds indicating increased LE strength and decreased fall risk    Baseline 03/14/21: 12 seconds 2/6: 12 seconds    Time 12    Period Weeks    Status On-going    Target Date 06/06/21      PT LONG TERM GOAL #3   Title Pt will improve DGI by at least 3 points in order to demonstrate clinically significant improvement in balance and decreased risk for falls.    Baseline  03/14/21: 15/24 2/6: 17/24    Time 12    Period Weeks    Status Partially Met    Target Date 06/06/21      PT LONG TERM GOAL #4   Title Pt will increase 6MWT by at least 47m(1621f in order to demonstrate clinically significant improvement in cardiopulmonary endurance and community ambulation    Baseline 03/14/21: 795 ft with QC 2/6: 865 ft without AD    Time 12  Period Weeks    Status Partially Met    Target Date 06/06/21      PT LONG TERM GOAL #5   Title The patient will be able to maintain >5 seconds of SLB on BLEs in order to decrease risk of falls when navigating obstacles, curbs and steps.    Baseline 03/14/21: RLE 4 seconds, LLE 2 seconds 2/6: L 3 seconds R 4 seconds    Time 12    Period Weeks    Status On-going    Target Date 06/06/21      PT LONG TERM GOAL #6   Title Patient will increase 10 meter walk test to at least 1.0 m/s with LRD as to improve gait speed for better community ambulation and to reduce fall risk.    Baseline 03/14/21: 0.67 m/s 2/6: 1.3 m/s    Time 12    Period Weeks    Status Achieved    Target Date 06/06/21                   Plan - 04/22/21 1304     Clinical Impression Statement Patient motivated and participated well within session. He was instructed in ROM exercise to facilitate better trunk/hip mobility. He does require min VCs for proper exercise technique. Initially patient exhibits increased tightness in right posterior hip and thoracic spine which was alleviated with AROM. He was instructed in advanced balance tasks standing on unstable surface. He exhibits decreased ankle strategies, particularly having a harder time recovering balance when falling posteriorly. Often patient will reach out with UE to recover balance with posterior lean. Patient instructed in great toe/lesser toe extension to facilitate better forward balance recovery when experiencing posterior loss of balance. See pt instructions. He would benefit from additional skilled PT  intervention to improve strength, balance and mobility;    Personal Factors and Comorbidities Age;Past/Current Experience;Comorbidity 3+    Comorbidities A-fib, lumbar disc disease, prostate CA, TIA, gout, hx of pulmonary embolism    Examination-Activity Limitations Carry;Lift;Stand;Locomotion Level;Bend;Dressing;Reach Overhead;Squat;Transfers;Caring for Others;Stairs    Examination-Participation Restrictions Community Activity;Yard Work;Cleaning;Laundry;Shop;Church    Stability/Clinical Decision Making Evolving/Moderate complexity    Rehab Potential Good    PT Frequency 2x / week    PT Duration 12 weeks    PT Treatment/Interventions ADLs/Self Care Home Management;Biofeedback;Aquatic Therapy;Canalith Repostioning;Cryotherapy;Electrical Stimulation;Iontophoresis 31m/ml Dexamethasone;Moist Heat;Traction;Ultrasound;Gait training;Stair training;DME Instruction;Functional mobility training;Therapeutic activities;Therapeutic exercise;Balance training;Neuromuscular re-education;Patient/family education;Manual techniques;Passive range of motion;Dry needling;Energy conservation;Vestibular;Joint Manipulations;Orthotic Fit/Training;Splinting;Taping;Visual/perceptual remediation/compensation    PT Next Visit Plan Progress balance, gait, cardioresp. endurance, strengthening    PT Home Exercise Plan 1/9: Access Code: [email or web address]; no updates    Consulted and Agree with Plan of Care Patient             Patient will benefit from skilled therapeutic intervention in order to improve the following deficits and impairments:  Abnormal gait, Decreased balance, Decreased range of motion, Decreased strength, Hypomobility, Impaired sensation, Pain, Improper body mechanics, Impaired flexibility, Decreased mobility, Decreased activity tolerance, Decreased endurance, Difficulty walking, Increased muscle spasms, Postural dysfunction  Visit Diagnosis: Unsteadiness on feet  Other abnormalities of gait and mobility  Muscle  weakness (generalized)  Abnormality of gait and mobility  Difficulty in walking, not elsewhere classified  Chronic bilateral low back pain, unspecified whether sciatica present     Problem List Patient Active Problem List   Diagnosis Date Noted   COVID-19 virus infection 02/17/2021   A-fib ([facility])    TIA (transient ischemic attack)    Lactic acidosis  Hyponatremia    Hypotension    Generalized weakness     Shelvie Salsberry, PT, DPT 04/22/2021, 1:08 PM  [place] MAIN [facility] SERVICES [address] [place], [name], [address] Phone: [phone]   Fax:  [phone]  Name: BADEN BETSCH MRN: [id number] Date of Birth: [birth year]

## 2021-04-23 ENCOUNTER — Ambulatory Visit: Payer: Medicare Other

## 2021-04-25 ENCOUNTER — Ambulatory Visit: Payer: Medicare Other | Admitting: Physical Therapy

## 2021-04-25 ENCOUNTER — Other Ambulatory Visit: Payer: Self-pay

## 2021-04-25 DIAGNOSIS — R2689 Other abnormalities of gait and mobility: Secondary | ICD-10-CM

## 2021-04-25 DIAGNOSIS — R2681 Unsteadiness on feet: Secondary | ICD-10-CM | POA: Diagnosis not present

## 2021-04-25 DIAGNOSIS — G8929 Other chronic pain: Secondary | ICD-10-CM

## 2021-04-25 DIAGNOSIS — M6281 Muscle weakness (generalized): Secondary | ICD-10-CM

## 2021-04-25 DIAGNOSIS — R262 Difficulty in walking, not elsewhere classified: Secondary | ICD-10-CM

## 2021-04-25 DIAGNOSIS — R278 Other lack of coordination: Secondary | ICD-10-CM

## 2021-04-25 DIAGNOSIS — M545 Low back pain, unspecified: Secondary | ICD-10-CM

## 2021-04-25 DIAGNOSIS — R269 Unspecified abnormalities of gait and mobility: Secondary | ICD-10-CM

## 2021-04-25 NOTE — Therapy (Signed)
[place] MAIN [facility] SERVICES [place], [name], [address] Phone: [phone]   Fax:  [phone]  Physical Therapy Treatment  Patient Details  Name: Nicholas [name] MRN: [id number] Date of Birth: 06-15-1945 Referring Provider (PT): Rusty Aus, MD   Encounter Date: 04/25/2021   PT End of Session - 04/25/21 1217     Visit Number 13    Number of Visits 25    Date for PT Re-Evaluation 06/06/21    Authorization Type next session 1/10 PN 2/6    Authorization Time Period 03/14/2021-06/06/2021    Progress Note Due on Visit 10    PT Start Time 1102    PT Stop Time 1150    PT Time Calculation (min) 48 min    Equipment Utilized During Treatment Gait belt    Activity Tolerance Patient tolerated treatment well    Behavior During Therapy [facility] for tasks assessed/performed             Past Medical History:  Diagnosis Date   A-fib ([place])    Cancer ([place]) 10/25/2019   Prostate   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   Subjective Assessment - 04/25/21 1213     Subjective Pt reports mild back pain following last session. He states that he had mild knee pain while walking at the grocery yesterday however the pain went away when he sat down to rest.    Pertinent History Pt is a 76 y/o male, returning to PT following admission to ED for evaluation of weakness s/p fall where pt was found to be COVID positive. Pt was in the hospital from 02/17/2021-02/23/2021. Pt was then d/c to home where he  received [facility]. Pt reports increased LBP since d/c from hospital and decreased balance since last in outpatient PT. While pt using QC currently, he reports he has been using a RW at home or grabbing onto the wall to steady himself while walking. Pt reports he has been walking home distances. PMH significant for TIA, a-fib, pulmonary embolism, TIA, gout, rhabdomyolysis.    Limitations Standing;Walking;House hold activities;Lifting    How long can you sit comfortably? not limited    How long can you stand comfortably? pt reports limited due to feeling unsteady    How long can you walk comfortably? Pt reports he is currently walking household distances    Patient Stated Goals Pt would like to improve his walking and balance    Currently in Pain? Yes    Pain Score 3     Pain Location Back    Pain Orientation Lower    Pain Descriptors / Indicators Aching    Pain Type Acute pain    Pain Onset More than a month ago               INTERVENTIONS -     Treadmill endurance training for LE muscular and cardiorespiratory endurance - pt ambulates up to  2.63mh (held for 2 minutes),  spending entire ambulatory time over 1.5 mph, x 7 minutes. CGA provided throughout. Cuing for proximity to handrails for safety with intervention and heavy BUE weightbearing noted. Decreased RLE clearance noted with fatigue.    Step-up onto airex placed on 6" step x20 reps each LE; pt rates as hard. Intermittent UE support on bar and min a provided.   At support surface- Pt standing on airex, NBOS, at board playing hangman x 7 min; lateral reaching encouraged, rates easy, mild unsteadiness observed.   Resisted walking at [employer]. 17.5# - 47f x 3 sets forward and 3 sets backwards  Difficulty with retro-stepping, uses step-strategy. PT provides close CGA.  Non-resisted backward walking, 3 x 162fwith close CGA and VC on visually aligning to machine for improved linear movement   Standing heel raises x20  reps; Pt rates as hard.   -intermittent UE support; decreased range noted without support    At support surface - Standing on half-foam performing DF and PF. Standing on half-foam performing squats through partial ROM. Pt uses intermittent UE support throughout and up to mod assist to maintain balance.   At support surface- Standing on airex, NBOS, EC - 2x60 sec. Pt uses intermittent UE support and min assist to maintain balance.    Pt educated throughout session about proper posture and technique with exercises. Improved exercise technique, movement at target joints, use of target muscles after min to mod verbal, visual, tactile cues.    Clinical Impression: Pt is pleasant and motivated during session. POC was continued with general LE strengthening, balance and endurance. Intermittent rest breaks required with SOB noted on two occasions. Most difficulty noticed with eyes closed and walking backwards. Tendency to fall backwards when he does experience LOB. Multimodal cueing to place weight through entire foot and find anterior weight shift. The pt will benefit from further skilled PT to improve strength, endurance and balance to increase QOL and decrease fall risk.            PT Short Term Goals - 04/15/21 1042       PT SHORT TERM GOAL #1   Title Patient will be independent in home exercise program to improve balance, and strength/mobility for better functional independence with ADLs and decreased fall risk.    Baseline 03/14/21: to be initiated within next 1-2 sessions 2/6: HEP compliance    Time 6    Period Weeks    Status Achieved    Target Date 06/06/21      PT SHORT TERM GOAL #2   Title Pt will improve gross BLE strength to 5/5 in order to increase ease and safety with mobility and ADLs.    Baseline 03/14/21: strength 4+/5 B, greatest deficit in B ankle DFs 4-/5 B 2/6: grossly 4+/5 with df 4/5    Time 6    Period Weeks    Status Partially Met    Target Date 04/25/21                PT Long Term Goals - 04/15/21 1020       PT LONG TERM GOAL #1   Title Patient will increase FOTO score to greater than 59 to demonstrate statistically significant improvement in mobility and quality of life.    Baseline 03/14/21: 58 2/6: 57%    Time 12    Period Weeks    Status On-going    Target Date 06/06/21      PT LONG TERM GOAL #2   Title Patient (>  53 years old) will complete five times sit to stand test in < 10 seconds indicating increased LE strength and decreased fall risk    Baseline 03/14/21: 12 seconds 2/6: 12 seconds    Time 12    Period Weeks    Status On-going    Target Date 06/06/21      PT LONG TERM GOAL #3   Title Pt will improve DGI by at least 3 points in order to demonstrate clinically significant improvement in balance and decreased risk for falls.    Baseline 03/14/21: 15/24 2/6: 17/24    Time 12    Period Weeks    Status Partially Met    Target Date 06/06/21      PT LONG TERM GOAL #4   Title Pt will increase 6MWT by at least 219m(1672f in order to demonstrate clinically significant improvement in cardiopulmonary endurance and community ambulation    Baseline 03/14/21: 795 ft with QC 2/6: 865 ft without AD    Time 12    Period Weeks    Status Partially Met    Target Date 06/06/21      PT LONG TERM GOAL #5   Title The patient will be able to maintain >5 seconds of SLB on BLEs in order to decrease risk of falls when navigating obstacles, curbs and steps.    Baseline 03/14/21: RLE 4 seconds, LLE 2 seconds 2/6: L 3 seconds R 4 seconds    Time 12    Period Weeks    Status On-going    Target Date 06/06/21      PT LONG TERM GOAL #6   Title Patient will increase 10 meter walk test to at least 1.0 m/s with LRD as to improve gait speed for better community ambulation and to reduce fall risk.    Baseline 03/14/21: 0.67 m/s 2/6: 1.3 m/s    Time 12    Period Weeks    Status Achieved    Target Date 06/06/21                   Plan - 04/25/21  1319     Clinical Impression Statement Pt is pleasant and motivated during session. POC was continued with general LE strengthening, balance and endurance. Intermittent rest breaks required with SOB noted on two occasions. Most difficulty noticed with eyes closed and walking backwards. Tendency to fall backwards when he does experience LOB. Multimodal cueing to place weight through entire foot and find anterior weight shift. The pt will benefit from further skilled PT to improve strength, endurance and balance to increase QOL and decrease fall risk.    Personal Factors and Comorbidities Age;Past/Current Experience;Comorbidity 3+    Comorbidities A-fib, lumbar disc disease, prostate CA, TIA, gout, hx of pulmonary embolism    Examination-Activity Limitations Carry;Lift;Stand;Locomotion Level;Bend;Dressing;Reach Overhead;Squat;Transfers;Caring for Others;Stairs    Examination-Participation Restrictions Community Activity;Yard Work;Cleaning;Laundry;Shop;Church    Stability/Clinical Decision Making Evolving/Moderate complexity    Rehab Potential Good    PT Frequency 2x / week    PT Duration 12 weeks    PT Treatment/Interventions ADLs/Self Care Home Management;Biofeedback;Aquatic Therapy;Canalith Repostioning;Cryotherapy;Electrical Stimulation;Iontophoresis 19m819ml Dexamethasone;Moist Heat;Traction;Ultrasound;Gait training;Stair training;DME Instruction;Functional mobility training;Therapeutic activities;Therapeutic exercise;Balance training;Neuromuscular re-education;Patient/family education;Manual techniques;Passive range of motion;Dry needling;Energy conservation;Vestibular;Joint Manipulations;Orthotic Fit/Training;Splinting;Taping;Visual/perceptual remediation/compensation    PT Next Visit Plan Progress balance, gait, cardioresp. endurance, strengthening    PT Home Exercise Plan 1/9: Access Code: [email or web address]    Consulted and Agree with Plan of Care Patient  Patient will benefit  from skilled therapeutic intervention in order to improve the following deficits and impairments:  Abnormal gait, Decreased balance, Decreased range of motion, Decreased strength, Hypomobility, Impaired sensation, Pain, Improper body mechanics, Impaired flexibility, Decreased mobility, Decreased activity tolerance, Decreased endurance, Difficulty walking, Increased muscle spasms, Postural dysfunction  Visit Diagnosis: Unsteadiness on feet  Other abnormalities of gait and mobility  Muscle weakness (generalized)  Abnormality of gait and mobility  Difficulty in walking, not elsewhere classified  Chronic bilateral low back pain, unspecified whether sciatica present  Acute low back pain, unspecified back pain laterality, unspecified whether sciatica present  Acute right-sided low back pain without sciatica  Other lack of coordination     Problem List Patient Active Problem List   Diagnosis Date Noted   COVID-19 virus infection 02/17/2021   A-fib ([place])    TIA (transient ischemic attack)    Lactic acidosis    Hyponatremia    Hypotension    Generalized weakness     Patrina Levering PT, DPT   [place], [name], [address] Phone: [phone]   Fax:  [phone]  Name: Nicholas [name] MRN: [id number] Date of Birth: 1945-03-21

## 2021-04-29 ENCOUNTER — Other Ambulatory Visit: Payer: Self-pay

## 2021-04-29 ENCOUNTER — Ambulatory Visit: Payer: Medicare Other

## 2021-04-29 ENCOUNTER — Ambulatory Visit: Payer: Medicare Other | Admitting: Physical Therapy

## 2021-04-29 DIAGNOSIS — R2681 Unsteadiness on feet: Secondary | ICD-10-CM | POA: Diagnosis not present

## 2021-04-29 DIAGNOSIS — M6281 Muscle weakness (generalized): Secondary | ICD-10-CM

## 2021-04-29 DIAGNOSIS — R2689 Other abnormalities of gait and mobility: Secondary | ICD-10-CM

## 2021-04-29 DIAGNOSIS — R269 Unspecified abnormalities of gait and mobility: Secondary | ICD-10-CM

## 2021-04-29 NOTE — Therapy (Signed)
[place] MAIN [facility] SERVICES [place], [name], [address] Phone: [phone]   Fax:  [phone]  Physical Therapy Treatment  Patient Details  Name: Nicholas [name] MRN: [id number] fall from Date of Birth: March 13, 1945 Referring Provider (PT): Rusty Aus, MD   Encounter Date: 04/29/2021   PT End of Session - 04/29/21 1140     Visit Number 14    Number of Visits 25    Date for PT Re-Evaluation 06/06/21    Authorization Type next session 1/10 PN 2/6    Authorization Time Period 03/14/2021-06/06/2021    Progress Note Due on Visit 10    PT Start Time 1125    PT Stop Time 1205    PT Time Calculation (min) 40 min    Equipment Utilized During Treatment Gait belt    Activity Tolerance Patient tolerated treatment well    Behavior During Therapy [facility] for tasks assessed/performed             Past Medical History:  Diagnosis Date   A-fib ([place])    Cancer ([place]) 10/25/2019   Prostate   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   Subjective Assessment - 04/29/21 1139     Subjective Pt reports no new fals, stumbles or LOB. 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    Pertinent History Pt is a 76 y/o male, returning to PT following admission to ED for evaluation of weakness s/p fall where pt was found to be COVID positive. Pt was in the hospital from 02/17/2021-02/23/2021. Pt was then d/c to home where he received [facility] PT. Pt reports increased LBP since d/c from  hospital and decreased balance since last in outpatient PT. While pt using QC currently, he reports he has been using a RW at home or grabbing onto the wall to steady himself while walking. Pt reports he has been walking home distances. PMH significant for TIA, a-fib, pulmonary embolism, TIA, gout, rhabdomyolysis.    Limitations Standing;Walking;House hold activities;Lifting    How long can you sit comfortably? not limited    How long can you stand comfortably? pt reports limited due to feeling unsteady    How long can you walk comfortably? Pt reports he is currently walking household distances    Patient Stated Goals Pt would like to improve his walking and balance    Pain Onset More than a month ago              INTERVENTIONS -     Treadmill endurance training for LE muscular and cardiorespiratory endurance - pt ambulates up to  2.13mh (held for 2 minutes), spending entire ambulatory time over 1.5 mph, x 6 minutes. CGA provided throughout. Cuing for proximity to handrails for safety with intervention and heavy BUE weightbearing noted. Decreased RLE clearance noted with fatigue. Cues for picking up feet provided throughout.      At support surface- Pt standing on airex,  staggered stance ,  x 5 min ( 2:30 on ea LE) ; Dual task of drawing on white board while maintaining balance, challenged with reach as well as NM controll with drawing task.    Resisted walking at [employer]. 17.5# - 87f x 2 sets forward and 2 sets backwards and to both sides sidestepping Difficulty with retro-stepping, uses step-strategy. PT provides close CGA. Cues to prevent cross over step pattern with side stepping    Standing heel raises x20 reps; Pt rates as hard.   -intermittent UE support; decreased range noted without support    At support surface - Standing on half-foam performing DF and PF. X 12 reps  Standing on half-foam performing squats through partial ROM. Pt uses intermittent UE support  throughout and up to mod assist to maintain balance.   At support surface- Standing on airex, NBOS, EC - 2x60 sec. Pt uses intermittent UE support and min assist to maintain balance.    Pt educated throughout session about proper posture and technique with exercises. Improved exercise technique, movement at target joints, use of target muscles after min to mod verbal, visual, tactile cues.                             PT Short Term Goals - 04/15/21 1042       PT SHORT TERM GOAL #1   Title Patient will be independent in home exercise program to improve balance, and strength/mobility for better functional independence with ADLs and decreased fall risk.    Baseline 03/14/21: to be initiated within next 1-2 sessions 2/6: HEP compliance    Time 6    Period Weeks    Status Achieved    Target Date 06/06/21      PT SHORT TERM GOAL #2   Title Pt will improve gross BLE strength to 5/5 in order to increase ease and safety with mobility and ADLs.    Baseline 03/14/21: strength 4+/5 B, greatest deficit in B ankle DFs 4-/5 B 2/6: grossly 4+/5 with df 4/5    Time 6    Period Weeks    Status Partially Met    Target Date 04/25/21               PT Long Term Goals - 04/15/21 1020       PT LONG TERM GOAL #1   Title Patient will increase FOTO score to greater than 59 to demonstrate statistically significant improvement in mobility and quality of life.    Baseline 03/14/21: 58 2/6: 57%    Time 12    Period Weeks    Status On-going    Target Date 06/06/21      PT LONG TERM GOAL #2   Title Patient (> 625years old) will complete five times sit to stand test in < 10 seconds indicating increased LE strength and decreased fall risk    Baseline 03/14/21: 12 seconds 2/6: 12 seconds    Time 12    Period Weeks    Status On-going    Target Date 06/06/21      PT LONG TERM GOAL #3   Title Pt will improve DGI by at least 3 points in order to demonstrate clinically significant  improvement in balance and decreased risk for falls.    Baseline 03/14/21: 15/24 2/6: 17/24    Time 12    Period Weeks    Status Partially Met    Target  Date 06/06/21      PT LONG TERM GOAL #4   Title Pt will increase 6MWT by at least 42m(1631f in order to demonstrate clinically significant improvement in cardiopulmonary endurance and community ambulation    Baseline 03/14/21: 795 ft with QC 2/6: 865 ft without AD    Time 12    Period Weeks    Status Partially Met    Target Date 06/06/21      PT LONG TERM GOAL #5   Title The patient will be able to maintain >5 seconds of SLB on BLEs in order to decrease risk of falls when navigating obstacles, curbs and steps.    Baseline 03/14/21: RLE 4 seconds, LLE 2 seconds 2/6: L 3 seconds R 4 seconds    Time 12    Period Weeks    Status On-going    Target Date 06/06/21      PT LONG TERM GOAL #6   Title Patient will increase 10 meter walk test to at least 1.0 m/s with LRD as to improve gait speed for better community ambulation and to reduce fall risk.    Baseline 03/14/21: 0.67 m/s 2/6: 1.3 m/s    Time 12    Period Weeks    Status Achieved    Target Date 06/06/21                   Plan - 04/29/21 1147     Clinical Impression Statement Pt is pleasant and motivated during session. POC was continued with general LE strengthening, balance and endurance. Intermittent rest breaks required with SOB noted on two occasions. Most difficulty noticed with eyes closed and walking backwards. Tendency to fall backwards when he does experience LOB. Multimodal cueing to place weight through entire foot and find anterior weight shift. The pt will benefit from further skilled PT to improve strength, endurance and balance to increase QOL and decrease fall risk.    Personal Factors and Comorbidities Age;Past/Current Experience;Comorbidity 3+    Comorbidities A-fib, lumbar disc disease, prostate CA, TIA, gout, hx of pulmonary embolism    Examination-Activity  Limitations Carry;Lift;Stand;Locomotion Level;Bend;Dressing;Reach Overhead;Squat;Transfers;Caring for Others;Stairs    Examination-Participation Restrictions Community Activity;Yard Work;Cleaning;Laundry;Shop;Church    Stability/Clinical Decision Making Evolving/Moderate complexity    Rehab Potential Good    PT Frequency 2x / week    PT Duration 12 weeks    PT Treatment/Interventions ADLs/Self Care Home Management;Biofeedback;Aquatic Therapy;Canalith Repostioning;Cryotherapy;Electrical Stimulation;Iontophoresis 8m65ml Dexamethasone;Moist Heat;Traction;Ultrasound;Gait training;Stair training;DME Instruction;Functional mobility training;Therapeutic activities;Therapeutic exercise;Balance training;Neuromuscular re-education;Patient/family education;Manual techniques;Passive range of motion;Dry needling;Energy conservation;Vestibular;Joint Manipulations;Orthotic Fit/Training;Splinting;Taping;Visual/perceptual remediation/compensation    PT Next Visit Plan Progress balance, gait, cardioresp. endurance, strengthening    PT Home Exercise Plan 1/9: Access Code: [email or web address]    Consulted and Agree with Plan of Care Patient             Patient will benefit from skilled therapeutic intervention in order to improve the following deficits and impairments:  Abnormal gait, Decreased balance, Decreased range of motion, Decreased strength, Hypomobility, Impaired sensation, Pain, Improper body mechanics, Impaired flexibility, Decreased mobility, Decreased activity tolerance, Decreased endurance, Difficulty walking, Increased muscle spasms, Postural dysfunction  Visit Diagnosis: No diagnosis found.     Problem List Patient Active Problem List   Diagnosis Date Noted   COVID-19 virus infection 02/17/2021   [place]  TIA (transient ischemic attack)    Lactic acidosis    Hyponatremia    Hypotension    Generalized weakness     ChrHarrell Gave  Arnold, PT 04/29/2021, 12:14 PM  [place] MAIN [facility] SERVICES [address] [place], [name], [address] Phone: [phone]   Fax:  [phone]  Name: THEODIS KINSEL MRN: [id number] Date of Birth: 09-17-1945

## 2021-04-30 ENCOUNTER — Other Ambulatory Visit: Payer: Medicare Other

## 2021-04-30 DIAGNOSIS — R3129 Other microscopic hematuria: Secondary | ICD-10-CM

## 2021-05-01 LAB — URINALYSIS, COMPLETE
Bilirubin, UA: NEGATIVE
Glucose, UA: NEGATIVE
Ketones, UA: NEGATIVE
Leukocytes,UA: NEGATIVE
Nitrite, UA: NEGATIVE
Specific Gravity, UA: >1.03 — ABNORMAL HIGH (ref 1.005–1.030)
Urobilinogen, Ur: 0.2 mg/dL (ref 0.2–1.0)
pH, UA: 5.5 (ref 5.0–7.5)

## 2021-05-01 LAB — MICROSCOPIC EXAMINATION
Bacteria, UA: NONE SEEN
Epithelial Cells (non renal): NONE SEEN /hpf (ref 0–10)
WBC, UA: NONE SEEN /hpf (ref 0–5)

## 2021-05-02 ENCOUNTER — Other Ambulatory Visit: Payer: Self-pay

## 2021-05-02 ENCOUNTER — Ambulatory Visit: Payer: Medicare Other

## 2021-05-02 DIAGNOSIS — R2681 Unsteadiness on feet: Secondary | ICD-10-CM

## 2021-05-02 DIAGNOSIS — M6281 Muscle weakness (generalized): Secondary | ICD-10-CM

## 2021-05-02 DIAGNOSIS — R2689 Other abnormalities of gait and mobility: Secondary | ICD-10-CM

## 2021-05-02 NOTE — Therapy (Signed)
[place] MAIN [facility] SERVICES [place], [name], [address] Phone: [phone]   Fax:  [phone]  Physical Therapy Treatment  Patient Details  Name: Nicholas [name] MRN: [id number] Date of Birth: [birth year] Referring Provider (PT): Rusty Aus, MD   Encounter Date: 05/02/2021   PT End of Session - 05/02/21 1725     Visit Number 15    Number of Visits 25    Date for PT Re-Evaluation 06/06/21    Authorization Type next session 1/10 PN 2/6    Authorization Time Period 03/14/2021-06/06/2021    Progress Note Due on Visit 10    PT Start Time 1102    PT Stop Time 1145    PT Time Calculation (min) 43 min    Equipment Utilized During Treatment Gait belt    Activity Tolerance Patient tolerated treatment well    Behavior During Therapy [facility] for tasks assessed/performed             Past Medical History:  Diagnosis Date   A-fib ([place])    Cancer ([place]) 10/25/2019   Prostate   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   Subjective Assessment - 05/02/21 1101     Subjective Pt ambulating without AD. Pt reports back pain since Saturday when he did a lot of vacuuming. Pt reports, "I've been feeling really weak." Pt reports loose stools. Pt unsure if food related. Pt reports only other symptom is feeling fatigued. Pt reports he realizes he forgot to take his B12 for past 2-3 days. Pt reports some difficulty sleeping due to pain in his foot and waking up due to his spouse waking up. 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    Pertinent History Pt is a 76 y/o male, returning to PT following admission to ED for evaluation of weakness s/p fall where pt was found to be COVID positive. Pt was in the hospital from 02/17/2021-02/23/2021. Pt was then d/c to home where he received [facility] PT. Pt reports increased LBP since d/c from hospital and decreased balance since last in outpatient PT. While pt using QC currently, he reports he has been using a RW at home or grabbing onto the wall to steady himself while walking. Pt reports he has been walking home distances. PMH significant for TIA, a-fib, pulmonary embolism, TIA, gout, rhabdomyolysis.    Limitations Standing;Walking;House hold activities;Lifting    How long can you sit comfortably? not limited    How long can you stand comfortably? pt reports limited due to feeling unsteady    How long can you walk comfortably? Pt reports he is currently walking household distances    Patient Stated Goals Pt would like to improve his walking and balance    Currently in Pain? Yes    Pain Location Back    Pain Onset More than a month ago            INTERVENTIONS -   TherEx:  Treadmill  endurance training for LE muscular and cardiorespiratory endurance - pt ambulates up to  2.5 mph (sustained in multiple 30 sec bursts) HIIT. HR reached 103 bpm max. Pt performs intervention x 7 min with CGA provided throughout and monitoring for exercise response. Cuing for proximity to handrails for safety with intervention. Pt continues to exhibit heavy BUE weightbearing. Continued cues for heel-toe sequencing/foot clearance.   Static lunges - 15x each LE; pt rates medium. Requires rest break following intervention.  Standing heel raises 1x21 B through decreased ROM. Exhibits slight knee flexion.  Neuro:  At support surface, gait belt donned and CGA provided throughout unless otherwise noted- Basketball balance intervention, pt standing on airex: WBOS x 1 round NBOS x 1  round WBOS in modified semi-tandem (increased stance width) x 1 round each LE    SLB progression: one foot on foam and one on 6" step - 2x30-45 sec each LE. Pt uses intermittent UE support on bar.   SLB 2x60 sec each LE. Pt uses intermittent UE support on bar.   Pt educated throughout session about proper posture and technique with exercises. Improved exercise technique, movement at target joints, use of target muscles after min to mod verbal, visual, tactile cues.     PT Education - 05/02/21 1725     Education Details exercise technique, body mechanics    Person(s) Educated Patient    Methods Explanation;Demonstration;Verbal cues    Comprehension Verbalized understanding;Returned demonstration;Verbal cues required;Need further instruction              PT Short Term Goals - 04/15/21 1042       PT SHORT TERM GOAL #1   Title Patient will be independent in home exercise program to improve balance, and strength/mobility for better functional independence with ADLs and decreased fall risk.    Baseline 03/14/21: to be initiated within next 1-2 sessions 2/6: HEP compliance    Time 6    Period Weeks    Status Achieved    Target Date 06/06/21      PT SHORT TERM GOAL #2   Title Pt will improve gross BLE strength to 5/5 in order to increase ease and safety with mobility and ADLs.    Baseline 03/14/21: strength 4+/5 B, greatest deficit in B ankle DFs 4-/5 B 2/6: grossly 4+/5 with df 4/5    Time 6    Period Weeks    Status Partially Met    Target Date 04/25/21               PT Long Term Goals - 04/15/21 1020       PT LONG TERM GOAL #1   Title Patient will increase FOTO score to greater than 59 to demonstrate statistically significant improvement in mobility and quality of life.    Baseline 03/14/21: 58 2/6: 57%    Time 12    Period Weeks    Status On-going    Target Date 06/06/21      PT LONG TERM GOAL #2   Title Patient (> 5 years old) will complete five times sit to  stand test in < 10 seconds indicating increased LE strength and decreased fall risk    Baseline 03/14/21: 12 seconds 2/6: 12 seconds    Time 12    Period Weeks    Status On-going    Target Date 06/06/21      PT LONG TERM GOAL #3   Title Pt will improve DGI by at least 3 points in order to  demonstrate clinically significant improvement in balance and decreased risk for falls.    Baseline 03/14/21: 15/24 2/6: 17/24    Time 12    Period Weeks    Status Partially Met    Target Date 06/06/21      PT LONG TERM GOAL #4   Title Pt will increase 6MWT by at least 43m(1635f in order to demonstrate clinically significant improvement in cardiopulmonary endurance and community ambulation    Baseline 03/14/21: 795 ft with QC 2/6: 865 ft without AD    Time 12    Period Weeks    Status Partially Met    Target Date 06/06/21      PT LONG TERM GOAL #5   Title The patient will be able to maintain >5 seconds of SLB on BLEs in order to decrease risk of falls when navigating obstacles, curbs and steps.    Baseline 03/14/21: RLE 4 seconds, LLE 2 seconds 2/6: L 3 seconds R 4 seconds    Time 12    Period Weeks    Status On-going    Target Date 06/06/21      PT LONG TERM GOAL #6   Title Patient will increase 10 meter walk test to at least 1.0 m/s with LRD as to improve gait speed for better community ambulation and to reduce fall risk.    Baseline 03/14/21: 0.67 m/s 2/6: 1.3 m/s    Time 12    Period Weeks    Status Achieved    Target Date 06/06/21                   Plan - 05/02/21 1730     Clinical Impression Statement Pt continues to progress endurance training on treadmill, sustaining 30 sec bursts of 2.5 mph. However, pt still exhibits difficulty with heel-toe pattern and improving foot clearance. The pt also demonstrates ability to sustain SLB each LE for approx 4-5 sec. The pt will continue to benefit from further skilled PT to improve strength, endurance, gait and balance to decrease fall risk.     Personal Factors and Comorbidities Age;Past/Current Experience;Comorbidity 3+    Comorbidities A-fib, lumbar disc disease, prostate CA, TIA, gout, hx of pulmonary embolism    Examination-Activity Limitations Carry;Lift;Stand;Locomotion Level;Bend;Dressing;Reach Overhead;Squat;Transfers;Caring for Others;Stairs    Examination-Participation Restrictions Community Activity;Yard Work;Cleaning;Laundry;Shop;Church    Stability/Clinical Decision Making Evolving/Moderate complexity    Rehab Potential Good    PT Frequency 2x / week    PT Duration 12 weeks    PT Treatment/Interventions ADLs/Self Care Home Management;Biofeedback;Aquatic Therapy;Canalith Repostioning;Cryotherapy;Electrical Stimulation;Iontophoresis 72m69ml Dexamethasone;Moist Heat;Traction;Ultrasound;Gait training;Stair training;DME Instruction;Functional mobility training;Therapeutic activities;Therapeutic exercise;Balance training;Neuromuscular re-education;Patient/family education;Manual techniques;Passive range of motion;Dry needling;Energy conservation;Vestibular;Joint Manipulations;Orthotic Fit/Training;Splinting;Taping;Visual/perceptual remediation/compensation    PT Next Visit Plan Progress balance, gait, cardioresp. endurance, strengthening, continue POC as previously indicated    PT Home Exercise Plan 1/9: Access Code: [email or web address]; no updates    Consulted and Agree with Plan of Care Patient             Patient will benefit from skilled therapeutic intervention in order to improve the following deficits and impairments:  Abnormal gait, Decreased balance, Decreased range of motion, Decreased strength, Hypomobility, Impaired sensation, Pain, Improper body mechanics, Impaired flexibility, Decreased mobility, Decreased activity tolerance, Decreased endurance, Difficulty walking, Increased muscle spasms, Postural dysfunction  Visit Diagnosis: Unsteadiness on feet  Other abnormalities of gait and mobility  Muscle weakness  (generalized)     Problem List Patient Active Problem List   Diagnosis Date Noted  COVID-19 virus infection 02/17/2021   A-fib ([place])    TIA (transient ischemic attack)    Lactic acidosis    Hyponatremia    Hypotension    Generalized weakness     Zollie Pee, PT 05/02/2021, 5:33 PM  [place] MAIN [facility] SERVICES [address] [place], [name], [address] Phone: [phone]   Fax:  [phone]  Name: Nicholas [name] MRN: [id number] Date of Birth: [birth year]

## 2021-05-06 ENCOUNTER — Ambulatory Visit: Payer: Medicare Other

## 2021-05-07 ENCOUNTER — Ambulatory Visit: Payer: Medicare Other | Admitting: Physical Therapy

## 2021-05-07 ENCOUNTER — Encounter: Payer: Self-pay | Admitting: Physical Therapy

## 2021-05-07 ENCOUNTER — Other Ambulatory Visit: Payer: Self-pay

## 2021-05-07 DIAGNOSIS — R2681 Unsteadiness on feet: Secondary | ICD-10-CM

## 2021-05-07 DIAGNOSIS — M6281 Muscle weakness (generalized): Secondary | ICD-10-CM

## 2021-05-07 DIAGNOSIS — R2689 Other abnormalities of gait and mobility: Secondary | ICD-10-CM

## 2021-05-07 DIAGNOSIS — M545 Low back pain, unspecified: Secondary | ICD-10-CM

## 2021-05-07 DIAGNOSIS — R269 Unspecified abnormalities of gait and mobility: Secondary | ICD-10-CM

## 2021-05-07 DIAGNOSIS — R262 Difficulty in walking, not elsewhere classified: Secondary | ICD-10-CM

## 2021-05-07 NOTE — Therapy (Signed)
[place] MAIN [facility] SERVICES [place], [name], [address] Phone: [phone]   Fax:  [phone]  Physical Therapy Treatment  Patient Details  Name: Nicholas [name] MRN: [id number] Date of Birth: [birth year] Referring Provider (PT): Rusty Aus, MD   Encounter Date: 05/07/2021   PT End of Session - 05/07/21 1050     Visit Number 16    Number of Visits 25    Date for PT Re-Evaluation 06/06/21    Authorization Type next session 1/10 PN 2/6    Authorization Time Period 03/14/2021-06/06/2021    Progress Note Due on Visit 10    PT Start Time 1018    PT Stop Time 1100    PT Time Calculation (min) 42 min    Equipment Utilized During Treatment Gait belt    Activity Tolerance Patient tolerated treatment well    Behavior During Therapy [facility] for tasks assessed/performed             Past Medical History:  Diagnosis Date   A-fib ([place])    Cancer ([place]) 10/25/2019   Prostate   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   Subjective Assessment - 05/07/21 1033     Subjective Patient presents to therapy without AD; reports a little stiffness/soreness after last session. He reports he was able to pick up a shoe from the floor at home with less difficulty. he reports not sleeping as well last night and said his feet were hurting him.    Pertinent History Pt is a 76 y/o male, returning to PT following admission to ED for evaluation of weakness s/p fall where pt was found to be COVID positive. Pt was  in the hospital from 02/17/2021-02/23/2021. Pt was then d/c to home where he received [place] PT. Pt reports increased LBP since d/c from hospital and decreased balance since last in outpatient PT. While pt using QC currently, he reports he has been using a RW at home or grabbing onto the wall to steady himself while walking. Pt reports he has been walking home distances. PMH significant for TIA, a-fib, pulmonary embolism, TIA, gout, rhabdomyolysis.    Limitations Standing;Walking;House hold activities;Lifting    How long can you sit comfortably? not limited    How long can you stand comfortably? pt reports limited due to feeling unsteady    How long can you walk comfortably? Pt reports he is currently walking household distances    Patient Stated Goals Pt would like to improve his walking and balance    Currently in Pain? No/denies    Pain Onset More than a month ago    Multiple Pain Sites No                  INTERVENTIONS -   TherEx: Treadmill endurance training for LE muscular and cardiorespiratory endurance - pt ambulates up to (2.0-2.66mh) 2.5 mph (sustained in multiple 30 sec bursts) HIIT. HR reached 122 bpm max, SPo2 95%; Pt  performs intervention x 5.3 min with CGA provided throughout and monitoring for exercise response. Cuing for proximity to handrails for safety with intervention. Pt continues to exhibit heavy BUE weightbearing. Continued cues for heel-toe sequencing/foot clearance. Does exhibit increased foot slap with additional walking at faster pace;   Standing in parallel bars; Forward lunges to BOSU x15 reps each LE;  Forward step JJH:ERDE plus airex with intermittent finger tip hold X10 reps each LE, reports minimal difficulty, HR 166, Spo2 94%  Standing with green tband around BLE: -hip abduction x10 reps each LE -hip extension x10 reps each LE Required cues for proper positioning including to avoid trunk lean and improve foot positioning for better hip strengthening;    NMR: Standing on BOSU:  Standing heel raises x15 reps with BUE rail assist Alternate march x10 reps, with heavy UE assist, and decreased ankle stability, required cues to slow down LE movement to challenge ankle stability and control;  Patient exhibits decreased ankle control on advanced compliant surface;    Pt educated throughout session about proper posture and technique with exercises. Improved exercise technique, movement at target joints, use of target muscles after min to mod verbal, visual, tactile cues.                          PT Short Term Goals - 04/15/21 1042       PT SHORT TERM GOAL #1   Title Patient will be independent in home exercise program to improve balance, and strength/mobility for better functional independence with ADLs and decreased fall risk.    Baseline 03/14/21: to be initiated within next 1-2 sessions 2/6: HEP compliance    Time 6    Period Weeks    Status Achieved    Target Date 06/06/21      PT SHORT TERM GOAL #2   Title Pt will improve gross BLE strength to 5/5 in order to increase ease and safety with mobility and ADLs.    Baseline 03/14/21: strength 4+/5 B, greatest deficit in B ankle DFs 4-/5 B 2/6: grossly 4+/5 with df 4/5    Time 6    Period Weeks    Status Partially Met    Target Date 04/25/21               PT Long Term Goals - 04/15/21 1020       PT LONG TERM GOAL #1   Title Patient will increase FOTO score to greater than 59 to demonstrate statistically significant improvement in mobility and quality of life.    Baseline 03/14/21: 58 2/6: 57%    Time 12    Period Weeks    Status On-going    Target Date 06/06/21      PT LONG TERM GOAL #2   Title Patient (> 78 years old) will complete five times sit to stand test in < 10 seconds indicating increased LE strength and decreased fall risk    Baseline 03/14/21: 12 seconds 2/6: 12 seconds    Time 12    Period Weeks    Status On-going    Target Date 06/06/21       PT LONG TERM GOAL #3   Title Pt will improve DGI by at least 3 points in order to demonstrate clinically significant improvement in balance and decreased risk for falls.    Baseline 03/14/21: 15/24 2/6: 17/24    Time 12    Period Weeks    Status Partially Met  Target Date 06/06/21      PT LONG TERM GOAL #4   Title Pt will increase 6MWT by at least 49m(1673f in order to demonstrate clinically significant improvement in cardiopulmonary endurance and community ambulation    Baseline 03/14/21: 795 ft with QC 2/6: 865 ft without AD    Time 12    Period Weeks    Status Partially Met    Target Date 06/06/21      PT LONG TERM GOAL #5   Title The patient will be able to maintain >5 seconds of SLB on BLEs in order to decrease risk of falls when navigating obstacles, curbs and steps.    Baseline 03/14/21: RLE 4 seconds, LLE 2 seconds 2/6: L 3 seconds R 4 seconds    Time 12    Period Weeks    Status On-going    Target Date 06/06/21      PT LONG TERM GOAL #6   Title Patient will increase 10 meter walk test to at least 1.0 m/s with LRD as to improve gait speed for better community ambulation and to reduce fall risk.    Baseline 03/14/21: 0.67 m/s 2/6: 1.3 m/s    Time 12    Period Weeks    Status Achieved    Target Date 06/06/21                   Plan - 05/07/21 1051     Clinical Impression Statement Patient motivated and participated well within session. he continues to be challenged with cardiovascular conditioning to challenge endurance. Vitals monitored throughout session with elevated HR with advanced exercise; Patient does exhibit decreased ankle stability when standing on compliant surface (BOSU) requiring increased UE rail assist. He required cues for better weight shift and to slow down LE movement for better motor control/support. Patient instructed in advanced LE strengthening, utilizing resistance band for strengthening. educated patient in ways to increase resistance with HEP  for better strengthening. He would benefit from additional skilled PT Intervention to improve strength, balance and mobility;    Personal Factors and Comorbidities Age;Past/Current Experience;Comorbidity 3+    Comorbidities A-fib, lumbar disc disease, prostate CA, TIA, gout, hx of pulmonary embolism    Examination-Activity Limitations Carry;Lift;Stand;Locomotion Level;Bend;Dressing;Reach Overhead;Squat;Transfers;Caring for Others;Stairs    Examination-Participation Restrictions Community Activity;Yard Work;Cleaning;Laundry;Shop;Church    Stability/Clinical Decision Making Evolving/Moderate complexity    Rehab Potential Good    PT Frequency 2x / week    PT Duration 12 weeks    PT Treatment/Interventions ADLs/Self Care Home Management;Biofeedback;Aquatic Therapy;Canalith Repostioning;Cryotherapy;Electrical Stimulation;Iontophoresis 35m60ml Dexamethasone;Moist Heat;Traction;Ultrasound;Gait training;Stair training;DME Instruction;Functional mobility training;Therapeutic activities;Therapeutic exercise;Balance training;Neuromuscular re-education;Patient/family education;Manual techniques;Passive range of motion;Dry needling;Energy conservation;Vestibular;Joint Manipulations;Orthotic Fit/Training;Splinting;Taping;Visual/perceptual remediation/compensation    PT Next Visit Plan Progress balance, gait, cardioresp. endurance, strengthening, continue POC as previously indicated    PT Home Exercise Plan 1/9: Access Code: [email or web address] updates    Consulted and Agree with Plan of Care Patient             Patient will benefit from skilled therapeutic intervention in order to improve the following deficits and impairments:  Abnormal gait, Decreased balance, Decreased range of motion, Decreased strength, Hypomobility, Impaired sensation, Pain, Improper body mechanics, Impaired flexibility, Decreased mobility, Decreased activity tolerance, Decreased endurance, Difficulty walking, Increased muscle spasms, Postural  dysfunction  Visit Diagnosis: Unsteadiness on feet  Other abnormalities of gait and mobility  Muscle weakness (generalized)  Abnormality of gait and mobility  Difficulty in walking, not elsewhere classified  Chronic bilateral low back  pain, unspecified whether sciatica present  Acute low back pain, unspecified back pain laterality, unspecified whether sciatica present     Problem List Patient Active Problem List   Diagnosis Date Noted   COVID-19 virus infection 02/17/2021   A-fib ([place])    TIA (transient ischemic attack)    Lactic acidosis    Hyponatremia    Hypotension    Generalized weakness     Mathhew Buysse, PT, DPT 05/07/2021, 11:03 AM  [place] MAIN [facility] SERVICES [address] [place], [name], [address] Phone: [phone]   Fax:  [phone]  Name: Nicholas [name] MRN: [id number] Date of Birth: 04/20/1945

## 2021-05-09 ENCOUNTER — Other Ambulatory Visit: Payer: Self-pay

## 2021-05-09 ENCOUNTER — Ambulatory Visit: Payer: Medicare Other | Attending: Internal Medicine

## 2021-05-09 DIAGNOSIS — M6281 Muscle weakness (generalized): Secondary | ICD-10-CM | POA: Diagnosis present

## 2021-05-09 DIAGNOSIS — R269 Unspecified abnormalities of gait and mobility: Secondary | ICD-10-CM | POA: Insufficient documentation

## 2021-05-09 DIAGNOSIS — R278 Other lack of coordination: Secondary | ICD-10-CM | POA: Diagnosis present

## 2021-05-09 DIAGNOSIS — R262 Difficulty in walking, not elsewhere classified: Secondary | ICD-10-CM | POA: Diagnosis present

## 2021-05-09 DIAGNOSIS — R2681 Unsteadiness on feet: Secondary | ICD-10-CM | POA: Diagnosis not present

## 2021-05-09 DIAGNOSIS — M25562 Pain in left knee: Secondary | ICD-10-CM | POA: Insufficient documentation

## 2021-05-09 DIAGNOSIS — R2689 Other abnormalities of gait and mobility: Secondary | ICD-10-CM

## 2021-05-09 NOTE — Therapy (Signed)
[place] MAIN [facility] SERVICES [place], [name], [address] Phone: [phone]   Fax:  [phone]  Physical Therapy Treatment  Patient Details  Name: Nicholas [name] MRN: [id number] Date of Birth: September 14, 1945 Referring Provider (PT): Rusty Aus, MD   Encounter Date: 05/09/2021   PT End of Session - 05/10/21 0805     Visit Number 17    Number of Visits 25    Date for PT Re-Evaluation 06/06/21    Authorization Type next session 1/10 PN 2/6    Authorization Time Period 03/14/2021-06/06/2021    Progress Note Due on Visit 10    PT Start Time 1104    PT Stop Time 1145    PT Time Calculation (min) 41 min    Equipment Utilized During Treatment Gait belt    Activity Tolerance Patient tolerated treatment well    Behavior During Therapy [facility] for tasks assessed/performed             Past Medical History:  Diagnosis Date   A-fib ([place])    Cancer ([place]) 10/25/2019   Prostate   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   Subjective Assessment - 05/09/21 1104     Subjective Pt reports LE tightness sensation. Pt reports difficulty with balance today, but no falls.    Pertinent History Pt is a 76 y/o male, returning to PT following admission to ED for evaluation of weakness s/p fall where pt was found to be COVID positive. Pt was in the hospital from 02/17/2021-02/23/2021. Pt was then d/c to home where he received [place] [facility] PT. Pt reports increased LBP since d/c from hospital and decreased balance since  last in outpatient PT. While pt using QC currently, he reports he has been using a RW at home or grabbing onto the wall to steady himself while walking. Pt reports he has been walking home distances. PMH significant for TIA, a-fib, pulmonary embolism, TIA, gout, rhabdomyolysis.    Limitations Standing;Walking;House hold activities;Lifting    How long can you sit comfortably? not limited    How long can you stand comfortably? pt reports limited due to feeling unsteady    How long can you walk comfortably? Pt reports he is currently walking household distances    Patient Stated Goals Pt would like to improve his walking and balance    Currently in Pain? No/denies    Pain Onset More than a month ago             INTERVENTIONS -   TherEx:  Seated hamstring stretch 2x30 sec each LE; R tighter than L Seated piriformis stretch 2x30 sec each LE Standing calf stretch 60 sec each LE  Treadmill endurance training for LE muscular and cardiorespiratory endurance to promote safety with ambulating longer distances - pt ambulates up to 2.7 mph (sustained in multiple 30 sec bursts) HIIT. HR reached 125 bpm max, Pt performs intervention x 7.5 min with CGA provided throughout and monitoring for exercise  response. Continued cuing for proximity to handrails for safety with intervention. Pt continues to exhibit heavy BUE weightbearing. Continued cues for heel-toe sequencing/foot clearance. Pt rates medium-hard   Standing With 5# weights donned each LE: Step ups (alternating) onto 6" step at stairs, BUE support x multiple reps  Step ups onto 12" step, alternating, BUE support 4x each LE, very challenging for pt. Pt exhibits fatigue  Hip abduction 3x12 each LE. Pt difficulty maintaining correct technique due to fatigue Heel raises 2x20  Seated pball rollouts forward/backward, laterally x 2 min     Pt educated throughout session about proper posture and technique with exercises. Improved exercise technique,  movement at target joints, use of target muscles after min to mod verbal, visual, tactile cues.      PT Education - 05/10/21 0805     Education Details exercise technique    Person(s) Educated Patient    Methods Explanation;Demonstration;Verbal cues    Comprehension Verbalized understanding;Returned demonstration;Verbal cues required;Need further instruction              PT Short Term Goals - 04/15/21 1042       PT SHORT TERM GOAL #1   Title Patient will be independent in home exercise program to improve balance, and strength/mobility for better functional independence with ADLs and decreased fall risk.    Baseline 03/14/21: to be initiated within next 1-2 sessions 2/6: HEP compliance    Time 6    Period Weeks    Status Achieved    Target Date 06/06/21      PT SHORT TERM GOAL #2   Title Pt will improve gross BLE strength to 5/5 in order to increase ease and safety with mobility and ADLs.    Baseline 03/14/21: strength 4+/5 B, greatest deficit in B ankle DFs 4-/5 B 2/6: grossly 4+/5 with df 4/5    Time 6    Period Weeks    Status Partially Met    Target Date 04/25/21               PT Long Term Goals - 04/15/21 1020       PT LONG TERM GOAL #1   Title Patient will increase FOTO score to greater than 59 to demonstrate statistically significant improvement in mobility and quality of life.    Baseline 03/14/21: 58 2/6: 57%    Time 12    Period Weeks    Status On-going    Target Date 06/06/21      PT LONG TERM GOAL #2   Title Patient (> 66 years old) will complete five times sit to stand test in < 10 seconds indicating increased LE strength and decreased fall risk    Baseline 03/14/21: 12 seconds 2/6: 12 seconds    Time 12    Period Weeks    Status On-going    Target Date 06/06/21      PT LONG TERM GOAL #3   Title Pt will improve DGI by at least 3 points in order to demonstrate clinically significant improvement in balance and decreased risk for falls.    Baseline  03/14/21: 15/24 2/6: 17/24    Time 12    Period Weeks    Status Partially Met    Target Date 06/06/21      PT LONG TERM GOAL #4   Title Pt will increase 6MWT by at least 17m(1674f in order to demonstrate clinically significant improvement in cardiopulmonary endurance and community ambulation    Baseline 03/14/21: 795  ft with QC 2/6: 865 ft without AD    Time 12    Period Weeks    Status Partially Met    Target Date 06/06/21      PT LONG TERM GOAL #5   Title The patient will be able to maintain >5 seconds of SLB on BLEs in order to decrease risk of falls when navigating obstacles, curbs and steps.    Baseline 03/14/21: RLE 4 seconds, LLE 2 seconds 2/6: L 3 seconds R 4 seconds    Time 12    Period Weeks    Status On-going    Target Date 06/06/21      PT LONG TERM GOAL #6   Title Patient will increase 10 meter walk test to at least 1.0 m/s with LRD as to improve gait speed for better community ambulation and to reduce fall risk.    Baseline 03/14/21: 0.67 m/s 2/6: 1.3 m/s    Time 12    Period Weeks    Status Achieved    Target Date 06/06/21                   Plan - 05/10/21 0808     Clinical Impression Statement Pt motivated to participate in session. He responds well to interventions without excessive unsteadiness and without pain. Pt reports interventions improve sensation of LE tightness. Pt continues to progress treadmill endurance training (ambulates up to 2.7 mph, HR reaches max of 125 bpm). Pt very challenged with step-up and hip abduction interventions requiring rest breaks due to fatigue. The pt will benefit form further skilled PT to improve strength, balance and mobility.    Personal Factors and Comorbidities Age;Past/Current Experience;Comorbidity 3+    Comorbidities A-fib, lumbar disc disease, prostate CA, TIA, gout, hx of pulmonary embolism    Examination-Activity Limitations Carry;Lift;Stand;Locomotion Level;Bend;Dressing;Reach Overhead;Squat;Transfers;Caring for  Others;Stairs    Examination-Participation Restrictions Community Activity;Yard Work;Cleaning;Laundry;Shop;Church    Stability/Clinical Decision Making Evolving/Moderate complexity    Rehab Potential Good    PT Frequency 2x / week    PT Duration 12 weeks    PT Treatment/Interventions ADLs/Self Care Home Management;Biofeedback;Aquatic Therapy;Canalith Repostioning;Cryotherapy;Electrical Stimulation;Iontophoresis 35m/ml Dexamethasone;Moist Heat;Traction;Ultrasound;Gait training;Stair training;DME Instruction;Functional mobility training;Therapeutic activities;Therapeutic exercise;Balance training;Neuromuscular re-education;Patient/family education;Manual techniques;Passive range of motion;Dry needling;Energy conservation;Vestibular;Joint Manipulations;Orthotic Fit/Training;Splinting;Taping;Visual/perceptual remediation/compensation    PT Next Visit Plan Progress balance, gait, cardioresp. endurance, strengthening, continue POC as previously indicated    PT Home Exercise Plan 1/9: Access Code: [email or web address] no updates    Consulted and Agree with Plan of Care Patient             Patient will benefit from skilled therapeutic intervention in order to improve the following deficits and impairments:  Abnormal gait, Decreased balance, Decreased range of motion, Decreased strength, Hypomobility, Impaired sensation, Pain, Improper body mechanics, Impaired flexibility, Decreased mobility, Decreased activity tolerance, Decreased endurance, Difficulty walking, Increased muscle spasms, Postural dysfunction  Visit Diagnosis: Unsteadiness on feet  Muscle weakness (generalized)  Other abnormalities of gait and mobility     Problem List Patient Active Problem List   Diagnosis Date Noted   COVID-19 virus infection 02/17/2021   A-fib ([place]    TIA (transient ischemic attack)    Lactic acidosis    Hyponatremia    Hypotension    Generalized weakness     HZollie Pee PT 05/10/2021, 8:10 AM  [place] [address]Phone: [phone]  Fax:  [phone] Name: CWesam Gearhart  [place] MRN: [id number] Date of Birth: 22-Nov-1945

## 2021-05-13 ENCOUNTER — Ambulatory Visit: Payer: Medicare Other

## 2021-05-13 ENCOUNTER — Other Ambulatory Visit: Payer: Self-pay

## 2021-05-13 DIAGNOSIS — R2681 Unsteadiness on feet: Secondary | ICD-10-CM | POA: Diagnosis not present

## 2021-05-13 DIAGNOSIS — R262 Difficulty in walking, not elsewhere classified: Secondary | ICD-10-CM

## 2021-05-13 DIAGNOSIS — R269 Unspecified abnormalities of gait and mobility: Secondary | ICD-10-CM

## 2021-05-13 DIAGNOSIS — M6281 Muscle weakness (generalized): Secondary | ICD-10-CM

## 2021-05-13 NOTE — Therapy (Signed)
[place] MAIN [facility] SERVICES [address] [place], [name], [address] Phone: [phone]   Fax:  [phone]  Physical Therapy Treatment  Patient Details  Name: Nicholas [name] MRN: [id number] Date of Birth: 04/12/1945 No data recorded  Encounter Date: 05/13/2021   PT End of Session - 05/13/21 0752     Visit Number 18    Number of Visits 25    Date for PT Re-Evaluation 06/06/21    Authorization Type next session 1/10 PN 2/6    Authorization Time Period 03/14/2021-06/06/2021    Progress Note Due on Visit 20    PT Start Time 1015    PT Stop Time 1057    PT Time Calculation (min) 42 min    Equipment Utilized During Treatment Gait belt    Activity Tolerance Patient tolerated treatment well;No increased pain    Behavior During Therapy [facility] for tasks assessed/performed             Past Medical History:  Diagnosis Date   A-fib ([place])    Cancer ([place]) 10/25/2019   Prostate   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   Subjective Assessment - 05/13/21 1020     Subjective Patient reports injurying his left knee over the weekend- moving chairs at a function. States it is better but still limping and endorses some pain.    Pertinent History Pt is a 76 y/o male, returning to PT following admission to ED for evaluation of weakness s/p fall where pt was found to be COVID positive. Pt was in the hospital from 02/17/2021-02/23/2021. Pt was then d/c to home where he received [place] [facility]. Pt reports increased LBP since  d/c from hospital and decreased balance since last in outpatient PT. While pt using QC currently, he reports he has been using a RW at home or grabbing onto the wall to steady himself while walking. Pt reports he has been walking home distances. PMH significant for TIA, a-fib, pulmonary embolism, TIA, gout, rhabdomyolysis.    Limitations Standing;Walking;House hold activities;Lifting    How long can you sit comfortably? not limited    How long can you stand comfortably? pt reports limited due to feeling unsteady    How long can you walk comfortably? Pt reports he is currently walking household distances    Patient Stated Goals Pt would like to improve his walking and balance    Currently in Pain? Yes    Pain Score 5     Pain Location Knee    Pain Orientation Left;Anterior    Pain Descriptors / Indicators Aching;Sore    Pain Type Acute pain    Pain Onset In the past 7 days    Pain Frequency Intermittent    Aggravating Factors  worse with walking    Pain Relieving Factors Rest    Effect of Pain on Daily Activities Difficulty with prolonged walking             Assessment:  Left knee  ROM- Full ROM - symmetrical with Right LE No point tenderness at joint line, patella, or med/lateral/ant/post knee Special Test- McMurray, Ant drawer, Lachman, post drawer, varus/valgus stress- all negative.  Passive accessory motion- WNL with knee/Patella (no pain)  Gait obvious limp on left LE - using trekking pole on right today.   Strength= 4+/5 left knee flex/ext (no pain)  Therex: Seated due to recent knee pain (no weight bearing exercises to allow knee a few more day to heal)   Knee ext with 5lb AW- BLE 2sets of 13 reps (no reported pain)  Hip flex/abd over cone 2 sets x 13 reps BLE Hamstring curls with blue TB 2 sets of 13 reps BLE Heel/toe raises x 13 reps each.   Manual therapy: PROM to left knee (flex/ext) in supine.  Grade 2-3 tibiofemoral PA/AP glides x 30 bouts in 3 varying knee  positions.   Reassessed gait after treatment- Patient ambulated approx 75 feet without use of AD and no obvious antalgia.    Education provided throughout session via VC/TC and demonstration to facilitate movement at target joints and correct muscle activation for all testing and exercises performed.                          PT Education - 05/13/21 1700     Education Details Exercise technique    Person(s) Educated Patient    Methods Explanation;Demonstration;Tactile cues;Verbal cues    Comprehension Verbalized understanding;Returned demonstration;Verbal cues required;Tactile cues required;Need further instruction              PT Short Term Goals - 04/15/21 1042       PT SHORT TERM GOAL #1   Title Patient will be independent in home exercise program to improve balance, and strength/mobility for better functional independence with ADLs and decreased fall risk.    Baseline 03/14/21: to be initiated within next 1-2 sessions 2/6: HEP compliance    Time 6    Period Weeks    Status Achieved    Target Date 06/06/21      PT SHORT TERM GOAL #2   Title Pt will improve gross BLE strength to 5/5 in order to increase ease and safety with mobility and ADLs.    Baseline 03/14/21: strength 4+/5 B, greatest deficit in B ankle DFs 4-/5 B 2/6: grossly 4+/5 with df 4/5    Time 6    Period Weeks    Status Partially Met    Target Date 04/25/21               PT Long Term Goals - 04/15/21 1020       PT LONG TERM GOAL #1   Title Patient will increase FOTO score to greater than 59 to demonstrate statistically significant improvement in mobility and quality of life.    Baseline 03/14/21: 58 2/6: 57%    Time 12    Period Weeks    Status On-going    Target Date 06/06/21      PT LONG TERM GOAL #2   Title Patient (> 106 years old) will complete five times sit to stand test in < 10 seconds indicating increased LE strength and decreased fall risk    Baseline 03/14/21: 12  seconds 2/6: 12 seconds    Time 12    Period Weeks    Status On-going    Target Date 06/06/21      PT LONG TERM GOAL #3   Title Pt will  improve DGI by at least 3 points in order to demonstrate clinically significant improvement in balance and decreased risk for falls.    Baseline 03/14/21: 15/24 2/6: 17/24    Time 12    Period Weeks    Status Partially Met    Target Date 06/06/21      PT LONG TERM GOAL #4   Title Pt will increase 6MWT by at least 14m (170ft) in order to demonstrate clinically significant improvement in cardiopulmonary endurance and community ambulation    Baseline 03/14/21: 795 ft with QC 2/6: 865 ft without AD    Time 12    Period Weeks    Status Partially Met    Target Date 06/06/21      PT LONG TERM GOAL #5   Title The patient will be able to maintain >5 seconds of SLB on BLEs in order to decrease risk of falls when navigating obstacles, curbs and steps.    Baseline 03/14/21: RLE 4 seconds, LLE 2 seconds 2/6: L 3 seconds R 4 seconds    Time 12    Period Weeks    Status On-going    Target Date 06/06/21      PT LONG TERM GOAL #6   Title Patient will increase 10 meter walk test to at least 1.0 m/s with LRD as to improve gait speed for better community ambulation and to reduce fall risk.    Baseline 03/14/21: 0.67 m/s 2/6: 1.3 m/s    Time 12    Period Weeks    Status Achieved    Target Date 06/06/21                   Plan - 05/13/21 0750     Clinical Impression Statement Treatment limited today secondary to patient experienced an injury to his left knee over the weekend. He was feeling okay today and responded well to assessment and treatment - including no obvious pain with muscle test or assessment and reported feeling better after manual techniques. He demonstrated no limp when walking after treatment. The pt will continue to benefit form further skilled PT to improve strength, balance and mobility for improved community mobility and quality of life     Personal Factors and Comorbidities Age;Past/Current Experience;Comorbidity 3+    Comorbidities A-fib, lumbar disc disease, prostate CA, TIA, gout, hx of pulmonary embolism    Examination-Activity Limitations Carry;Lift;Stand;Locomotion Level;Bend;Dressing;Reach Overhead;Squat;Transfers;Caring for Others;Stairs    Examination-Participation Restrictions Community Activity;Yard Work;Cleaning;Laundry;Shop;Church    Stability/Clinical Decision Making Evolving/Moderate complexity    Rehab Potential Good    PT Frequency 2x / week    PT Duration 12 weeks    PT Treatment/Interventions ADLs/Self Care Home Management;Biofeedback;Aquatic Therapy;Canalith Repostioning;Cryotherapy;Electrical Stimulation;Iontophoresis 4mg /ml Dexamethasone;Moist Heat;Traction;Ultrasound;Gait training;Stair training;DME Instruction;Functional mobility training;Therapeutic activities;Therapeutic exercise;Balance training;Neuromuscular re-education;Patient/family education;Manual techniques;Passive range of motion;Dry needling;Energy conservation;Vestibular;Joint Manipulations;Orthotic Fit/Training;Splinting;Taping;Visual/perceptual remediation/compensation    PT Next Visit Plan Progress balance, gait, cardioresp. endurance, strengthening, continue POC as previously indicated    PT Home Exercise Plan 1/9: Access Code: [email or web address]; no updates    Consulted and Agree with Plan of Care Patient             Patient will benefit from skilled therapeutic intervention in order to improve the following deficits and impairments:  Abnormal gait, Decreased balance, Decreased range of motion, Decreased strength, Hypomobility, Impaired sensation, Pain, Improper body mechanics, Impaired flexibility, Decreased mobility, Decreased activity tolerance, Decreased endurance, Difficulty walking, Increased muscle spasms, Postural dysfunction  Visit Diagnosis: Abnormality of gait and mobility  Unsteadiness  on feet  Difficulty in walking, not elsewhere  classified  Muscle weakness (generalized)     Problem List Patient Active Problem List   Diagnosis Date Noted   COVID-19 virus infection 02/17/2021   A-fib ([place])    TIA (transient ischemic attack)    Lactic acidosis    Hyponatremia    Hypotension    Generalized weakness     Lewis Moccasin, PT 05/14/2021, 7:53 AM  [place] MAIN [facility] SERVICES [address] [place], [name], [address] Phone: [phone]   Fax:  [phone]  Name: Nicholas [name] MRN: [id number] Date of Birth: 22-Oct-1945

## 2021-05-14 ENCOUNTER — Ambulatory Visit: Payer: Medicare Other

## 2021-05-16 ENCOUNTER — Ambulatory Visit: Payer: Medicare Other

## 2021-05-16 ENCOUNTER — Other Ambulatory Visit: Payer: Self-pay

## 2021-05-16 ENCOUNTER — Ambulatory Visit: Payer: Medicare Other | Admitting: Dermatology

## 2021-05-16 DIAGNOSIS — M6281 Muscle weakness (generalized): Secondary | ICD-10-CM

## 2021-05-16 DIAGNOSIS — R2681 Unsteadiness on feet: Secondary | ICD-10-CM | POA: Diagnosis not present

## 2021-05-16 DIAGNOSIS — R2689 Other abnormalities of gait and mobility: Secondary | ICD-10-CM

## 2021-05-16 DIAGNOSIS — M25562 Pain in left knee: Secondary | ICD-10-CM

## 2021-05-16 NOTE — Therapy (Signed)
[place] MAIN [facility] SERVICES [address] [place], [name], [address] Phone: [phone]   Fax:  [phone]  Physical Therapy Treatment  Patient Details  Name: Nicholas [name] MRN: [id number] Date of Birth: [birth year] No data recorded  Encounter Date: 05/16/2021   PT End of Session - 05/16/21 1201     Visit Number 19    Number of Visits 25    Date for PT Re-Evaluation 06/06/21    Authorization Type next session 1/10 PN 2/6    Authorization Time Period 03/14/2021-06/06/2021    Progress Note Due on Visit 20    PT Start Time 1105    PT Stop Time 1148    PT Time Calculation (min) 43 min    Equipment Utilized During Treatment Gait belt    Activity Tolerance Patient tolerated treatment well;No increased pain    Behavior During Therapy [facility] for tasks assessed/performed             Past Medical History:  Diagnosis Date   A-fib ([place])    Cancer ([place]) 10/25/2019   Prostate   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   Subjective Assessment - 05/16/21 1107     Subjective Pt ambulating without AD. Pt reports continued pain felt under L patella. Pt reports wearing his brace and being still helps pain. Pt reports twisting motions main uncomfortable motion.    Pertinent History Pt is a 76 y/o male, returning to PT following admission to ED for evaluation of weakness s/p fall where pt was found to be COVID positive. Pt was in the hospital from 02/17/2021-02/23/2021. Pt was then d/c to home where he received [place]  PT. Pt reports increased LBP since d/c from hospital and decreased balance since last in outpatient PT. While pt using QC currently, he reports he has been using a RW at home or grabbing onto the wall to steady himself while walking. Pt reports he has been walking home distances. PMH significant for TIA, a-fib, pulmonary embolism, TIA, gout, rhabdomyolysis.    Limitations Standing;Walking;House hold activities;Lifting    How long can you sit comfortably? not limited    How long can you stand comfortably? pt reports limited due to feeling unsteady    How long can you walk comfortably? Pt reports he is currently walking household distances    Patient Stated Goals Pt would like to improve his walking and balance    Currently in Pain? Yes    Pain Location Knee    Pain Orientation Left    Pain Onset In the past 7 days            Interventions  Towel slides forward/backward, medial and lateral LLE 2x15 each  Calf stretch on block with gastroc stretch (PT providing overpressure) 2x30 sec --following stretch pt performed seated LLE sciatic nerve floss  Knee ext with 5lb AW- BLE 2sets of 21 reps - pt reported initial pain 2/10 and reported it improved with reps, pt then  reported pain free. Pt rates as easy    Standing hamstring curls with 5# weights 2x22 alternating BLEs  Standing heel raises 2x21  Seated toe raises 2x20; challenging; pt with more motion on L than R  Figure four stretch 2x30 sec each LE  STS 1x11   Elastogel donned x 5 min to LLE. No adverse reaction to treatment. Pt performed the following with elastogel on Hip flex/abd onto 6" step with 5# weights donned 1x20 each LE    Pt educated throughout session about proper posture and technique with exercises. Improved exercise technique, movement at target joints, use of target muscles after min to mod verbal, visual, tactile cues.     PT Education - 05/16/21 1200     Education Details exercise technique    Person(s)  Educated Patient    Methods Explanation;Demonstration;Verbal cues    Comprehension Verbalized understanding;Returned demonstration;Verbal cues required;Need further instruction              PT Short Term Goals - 04/15/21 1042       PT SHORT TERM GOAL #1   Title Patient will be independent in home exercise program to improve balance, and strength/mobility for better functional independence with ADLs and decreased fall risk.    Baseline 03/14/21: to be initiated within next 1-2 sessions 2/6: HEP compliance    Time 6    Period Weeks    Status Achieved    Target Date 06/06/21      PT SHORT TERM GOAL #2   Title Pt will improve gross BLE strength to 5/5 in order to increase ease and safety with mobility and ADLs.    Baseline 03/14/21: strength 4+/5 B, greatest deficit in B ankle DFs 4-/5 B 2/6: grossly 4+/5 with df 4/5    Time 6    Period Weeks    Status Partially Met    Target Date 04/25/21               PT Long Term Goals - 04/15/21 1020       PT LONG TERM GOAL #1   Title Patient will increase FOTO score to greater than 59 to demonstrate statistically significant improvement in mobility and quality of life.    Baseline 03/14/21: 58 2/6: 57%    Time 12    Period Weeks    Status On-going    Target Date 06/06/21      PT LONG TERM GOAL #2   Title Patient (> 49 years old) will complete five times sit to stand test in < 10 seconds indicating increased LE strength and decreased fall risk    Baseline 03/14/21: 12 seconds 2/6: 12 seconds    Time 12    Period Weeks    Status On-going    Target Date 06/06/21      PT LONG TERM GOAL #3   Title Pt will improve DGI by at least 3 points in order to demonstrate clinically significant improvement in balance and decreased risk for falls.    Baseline 03/14/21: 15/24 2/6: 17/24    Time 12    Period Weeks    Status Partially Met    Target Date 06/06/21      PT LONG TERM GOAL #4   Title Pt will increase 6MWT by at least 27m(1662f in  order to demonstrate clinically significant improvement in cardiopulmonary endurance and community ambulation    Baseline 03/14/21: 795 ft with QC 2/6: 865 ft without AD    Time 12  Period Weeks    Status Partially Met    Target Date 06/06/21      PT LONG TERM GOAL #5   Title The patient will be able to maintain >5 seconds of SLB on BLEs in order to decrease risk of falls when navigating obstacles, curbs and steps.    Baseline 03/14/21: RLE 4 seconds, LLE 2 seconds 2/6: L 3 seconds R 4 seconds    Time 12    Period Weeks    Status On-going    Target Date 06/06/21      PT LONG TERM GOAL #6   Title Patient will increase 10 meter walk test to at least 1.0 m/s with LRD as to improve gait speed for better community ambulation and to reduce fall risk.    Baseline 03/14/21: 0.67 m/s 2/6: 1.3 m/s    Time 12    Period Weeks    Status Achieved    Target Date 06/06/21                   Plan - 05/16/21 1201     Clinical Impression Statement Treatment remains somewhat limited secondary to L knee pain. Pt highly motivated to participate in session and tolerated interventions well reporting no pain at end of session. PT instructed pt in safe use of ice pack for pain modulation before and after exercise. The pt will benefit from further skilled PT to improve strength, balance, and mobility to increase QOL.    Personal Factors and Comorbidities Age;Past/Current Experience;Comorbidity 3+    Comorbidities A-fib, lumbar disc disease, prostate CA, TIA, gout, hx of pulmonary embolism    Examination-Activity Limitations Carry;Lift;Stand;Locomotion Level;Bend;Dressing;Reach Overhead;Squat;Transfers;Caring for Others;Stairs    Examination-Participation Restrictions Community Activity;Yard Work;Cleaning;Laundry;Shop;Church    Stability/Clinical Decision Making Evolving/Moderate complexity    Rehab Potential Good    PT Frequency 2x / week    PT Duration 12 weeks    PT Treatment/Interventions ADLs/Self  Care Home Management;Biofeedback;Aquatic Therapy;Canalith Repostioning;Cryotherapy;Electrical Stimulation;Iontophoresis 25m/ml Dexamethasone;Moist Heat;Traction;Ultrasound;Gait training;Stair training;DME Instruction;Functional mobility training;Therapeutic activities;Therapeutic exercise;Balance training;Neuromuscular re-education;Patient/family education;Manual techniques;Passive range of motion;Dry needling;Energy conservation;Vestibular;Joint Manipulations;Orthotic Fit/Training;Splinting;Taping;Visual/perceptual remediation/compensation    PT Next Visit Plan Progress balance, gait, cardioresp. endurance, strengthening, continue POC as previously indicated    PT Home Exercise Plan 1/9: Access Code: [email or web address] no updates    Consulted and Agree with Plan of Care Patient             Patient will benefit from skilled therapeutic intervention in order to improve the following deficits and impairments:  Abnormal gait, Decreased balance, Decreased range of motion, Decreased strength, Hypomobility, Impaired sensation, Pain, Improper body mechanics, Impaired flexibility, Decreased mobility, Decreased activity tolerance, Decreased endurance, Difficulty walking, Increased muscle spasms, Postural dysfunction  Visit Diagnosis: Other abnormalities of gait and mobility  Acute pain of left knee  Muscle weakness (generalized)     Problem List Patient Active Problem List   Diagnosis Date Noted   COVID-19 virus infection 02/17/2021   A-fib ([place]    TIA (transient ischemic attack)    Lactic acidosis    Hyponatremia    Hypotension    Generalized weakness     HZollie Pee PT 05/16/2021, 12:07 PM  [place] [facility] [address] [address]Phone: [phone]  Fax:  [phone] Name: Nicholas SHAFERMRN: [id number] of Birth: [birth year]

## 2021-05-20 ENCOUNTER — Ambulatory Visit: Payer: Medicare Other

## 2021-05-20 ENCOUNTER — Other Ambulatory Visit: Payer: Self-pay

## 2021-05-20 DIAGNOSIS — R2689 Other abnormalities of gait and mobility: Secondary | ICD-10-CM

## 2021-05-20 DIAGNOSIS — R2681 Unsteadiness on feet: Secondary | ICD-10-CM | POA: Diagnosis not present

## 2021-05-20 DIAGNOSIS — M25562 Pain in left knee: Secondary | ICD-10-CM

## 2021-05-20 DIAGNOSIS — M6281 Muscle weakness (generalized): Secondary | ICD-10-CM

## 2021-05-20 NOTE — Therapy (Signed)
[place] MAIN [facility] SERVICES [address] [place], [name], [address] Phone: [phone]   Fax:  [phone]  Physical Therapy Treatment/ Physical Therapy Progress Note   Dates of reporting period  04/15/21   to   05/20/21   Patient Details  Name: Nicholas [name] MRN: [id number] Date of Birth: [birth year] No data recorded  Encounter Date: 05/20/2021   PT End of Session - 05/20/21 1013     Visit Number 20    Number of Visits 25    Date for PT Re-Evaluation 06/06/21    Authorization Type next session 1/10 PN 3/13    Authorization Time Period 03/14/2021-06/06/2021    Progress Note Due on Visit 20    PT Start Time 1015    PT Stop Time 1100    PT Time Calculation (min) 45 min    Equipment Utilized During Treatment Gait belt    Activity Tolerance Patient tolerated treatment well;No increased pain    Behavior During Therapy [facility] for tasks assessed/performed             Past Medical History:  Diagnosis Date   A-fib ([place])    Cancer ([place]) 10/25/2019   Prostate   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   Subjective Assessment - 05/20/21 1018     Subjective Patient reports his knee continues to bother him. Forgot to wear his brace today.    Pertinent History Pt is a 76 y/o male, returning to PT following admission to ED for evaluation of weakness s/p fall where pt was found to be COVID positive. Pt was in the hospital from 02/17/2021-02/23/2021. Pt was then d/c to home where he received [facility]. Pt reports  increased LBP since d/c from hospital and decreased balance since last in outpatient PT. While pt using QC currently, he reports he has been using a RW at home or grabbing onto the wall to steady himself while walking. Pt reports he has been walking home distances. PMH significant for TIA, a-fib, pulmonary embolism, TIA, gout, rhabdomyolysis.    Limitations Standing;Walking;House hold activities;Lifting    How long can you sit comfortably? not limited    How long can you stand comfortably? pt reports limited due to feeling unsteady    How long can you walk comfortably? Pt reports he is currently walking household distances    Patient Stated Goals Pt would like to improve his walking and balance    Currently in Pain? Yes    Pain Score 3     Pain Location Knee    Pain Orientation Left    Pain Descriptors / Indicators Aching    Pain Type Acute pain    Pain Onset In the past 7 days    Pain Frequency Intermittent    Aggravating Factors  walking, twisting                [facility] PT Assessment - 05/20/21 0001       Dynamic Gait  Index   Level Surface Normal    Change in Gait Speed Mild Impairment    Gait with Horizontal Head Turns Mild Impairment    Gait with Vertical Head Turns Mild Impairment    Gait and Pivot Turn Mild Impairment    Step Over Obstacle Normal    Step Around Obstacles Normal    Steps Mild Impairment    Total Score 19            Patient reports his knee continues to bother him. Forgot to wear his brace today.    Progress note BLE strength  Right Left  Hip flexion 5 5  Hip Abduction 4 4  Hip Adduction 4 4  Knee Extension  5 5  Knee Flexion 4- 4-  DF 3+ 3+  PF 4- 4-    FOTO: 57.4%  5x STS:  first try 19 seconds second try: 13.8 seconds  DGI: 19/24 6 MWT: 770 ft with walking stick  SLB 4 seconds with pain; R 15 seconds   Treatment:  GTB hamstring curls 15x each LE GTB around bilateral ankles: keeping feet wide with LAQ 15x each LE GTB around  bilateral ankles: alternating IR/ER 15x  GTB adduction 15x each LE  Patient's condition has the potential to improve in response to therapy. Maximum improvement is yet to be obtained. The anticipated improvement is attainable and reasonable in a generally predictable time.  Patient reports his knee is bothering him but he continues to move.   Patient's goals affected by his knee pain limiting his ambulation duration and sit to stands. His balance is improving despite pain as can be seen in DGI. Patient's condition has the potential to improve in response to therapy. Maximum improvement is yet to be obtained. The anticipated improvement is attainable and reasonable in a generally predictable time.The pt will benefit from further skilled PT to improve strength, balance, and mobility to increase QOL.                   PT Education - 05/20/21 1012     Education Details progress note    Person(s) Educated Patient    Methods Explanation;Demonstration;Tactile cues;Verbal cues    Comprehension Verbalized understanding;Returned demonstration;Verbal cues required;Tactile cues required              PT Short Term Goals - 05/20/21 1042       PT SHORT TERM GOAL #1   Title Patient will be independent in home exercise program to improve balance, and strength/mobility for better functional independence with ADLs and decreased fall risk.    Baseline 03/14/21: to be initiated within next 1-2 sessions 2/6: HEP compliance    Time 6    Period Weeks    Status Achieved    Target Date 06/06/21      PT SHORT TERM GOAL #2   Title Pt will improve gross BLE strength to 5/5 in order to increase ease and safety with mobility and ADLs.    Baseline 03/14/21: strength 4+/5 B, greatest deficit in B ankle DFs 4-/5 B 2/6: grossly 4+/5 with df 4/5 3/13: see note    Time 6    Period Weeks    Status Partially Met    Target Date 04/25/21               PT Long Term Goals - 05/20/21 1020       PT  LONG TERM GOAL #1   Title Patient will increase FOTO score to greater  than 59 to demonstrate statistically significant improvement in mobility and quality of life.    Baseline 03/14/21: 58 2/6: 57% 3/13: 57.4%    Time 12    Period Weeks    Status Partially Met    Target Date 06/06/21      PT LONG TERM GOAL #2   Title Patient (> 36 years old) will complete five times sit to stand test in < 10 seconds indicating increased LE strength and decreased fall risk    Baseline 03/14/21: 12 seconds 2/6: 12 seconds 3/13: 13.8 seconds without hands    Time 12    Period Weeks    Status On-going    Target Date 06/06/21      PT LONG TERM GOAL #3   Title Pt will improve DGI by at least 3 points in order to demonstrate clinically significant improvement in balance and decreased risk for falls.    Baseline 03/14/21: 15/24 2/6: 17/24 3/13: 19/24    Time 12    Period Weeks    Status Partially Met    Target Date 06/06/21      PT LONG TERM GOAL #4   Title Pt will increase 6MWT by at least 48m(1642f in order to demonstrate clinically significant improvement in cardiopulmonary endurance and community ambulation    Baseline 03/14/21: 795 ft with QC 2/6: 865 ft without AD 3/13: 770 ft with walking stick with knee pain    Time 12    Period Weeks    Status Partially Met    Target Date 06/06/21      PT LONG TERM GOAL #5   Title The patient will be able to maintain >5 seconds of SLB on BLEs in order to decrease risk of falls when navigating obstacles, curbs and steps.    Baseline 03/14/21: RLE 4 seconds, LLE 2 seconds 2/6: L 3 seconds R 4 seconds 3/13: L 4 seconds with pain R 15 seconds    Time 12    Period Weeks    Status Partially Met    Target Date 06/06/21      PT LONG TERM GOAL #6   Title Patient will increase 10 meter walk test to at least 1.0 m/s with LRD as to improve gait speed for better community ambulation and to reduce fall risk.    Baseline 03/14/21: 0.67 m/s 2/6: 1.3 m/s    Time 12    Period Weeks     Status Achieved    Target Date 06/06/21                   Plan - 05/20/21 1106     Clinical Impression Statement Patient's goals affected by his knee pain limiting his ambulation duration and sit to stands. His balance is improving despite pain as can be seen in DGI. Patient's condition has the potential to improve in response to therapy. Maximum improvement is yet to be obtained. The anticipated improvement is attainable and reasonable in a generally predictable time.The pt will benefit from further skilled PT to improve strength, balance, and mobility to increase QOL.    Personal Factors and Comorbidities Age;Past/Current Experience;Comorbidity 3+    Comorbidities A-fib, lumbar disc disease, prostate CA, TIA, gout, hx of pulmonary embolism    Examination-Activity Limitations Carry;Lift;Stand;Locomotion Level;Bend;Dressing;Reach Overhead;Squat;Transfers;Caring for Others;Stairs    Examination-Participation Restrictions Community Activity;Yard Work;Cleaning;Laundry;Shop;Church    Stability/Clinical Decision Making Evolving/Moderate complexity    Rehab Potential Good    PT Frequency 2x / week    PT  Duration 12 weeks    PT Treatment/Interventions ADLs/Self Care Home Management;Biofeedback;Aquatic Therapy;Canalith Repostioning;Cryotherapy;Electrical Stimulation;Iontophoresis 79m/ml Dexamethasone;Moist Heat;Traction;Ultrasound;Gait training;Stair training;DME Instruction;Functional mobility training;Therapeutic activities;Therapeutic exercise;Balance training;Neuromuscular re-education;Patient/family education;Manual techniques;Passive range of motion;Dry needling;Energy conservation;Vestibular;Joint Manipulations;Orthotic Fit/Training;Splinting;Taping;Visual/perceptual remediation/compensation    PT Next Visit Plan Progress balance, gait, cardioresp. endurance, strengthening, continue POC as previously indicated    PT Home Exercise Plan 1/9: Access Code: [email or web address] no updates    Consulted  and Agree with Plan of Care Patient             Patient will benefit from skilled therapeutic intervention in order to improve the following deficits and impairments:  Abnormal gait, Decreased balance, Decreased range of motion, Decreased strength, Hypomobility, Impaired sensation, Pain, Improper body mechanics, Impaired flexibility, Decreased mobility, Decreased activity tolerance, Decreased endurance, Difficulty walking, Increased muscle spasms, Postural dysfunction  Visit Diagnosis: Other abnormalities of gait and mobility  Acute pain of left knee  Muscle weakness (generalized)  Unsteadiness on feet     Problem List Patient Active Problem List   Diagnosis Date Noted   COVID-19 virus infection 02/17/2021   A-fib ([place]    TIA (transient ischemic attack)    Lactic acidosis    Hyponatremia    Hypotension    Generalized weakness     MJanna Arch PT, DPT  05/20/2021, 11:06 AM  [place] [address]Phone: [phone]  Fax:  [phone] Name: Nicholas RUSSETTMRN: [id number] of Birth: 110/23/1947

## 2021-05-21 ENCOUNTER — Ambulatory Visit: Payer: Medicare Other

## 2021-05-23 ENCOUNTER — Other Ambulatory Visit: Payer: Self-pay

## 2021-05-23 ENCOUNTER — Ambulatory Visit: Payer: Medicare Other

## 2021-05-23 DIAGNOSIS — R2681 Unsteadiness on feet: Secondary | ICD-10-CM | POA: Diagnosis not present

## 2021-05-23 NOTE — Therapy (Signed)
[place] [facility] [facility] SERVICES ?[place], [name], [address] ?Phone: [phone]   Fax:  [phone] ? ?Physical Therapy Treatment ? ?Patient Details  ?Name: Nicholas [name] ?MRN: [id number] ?Date of Birth: 1945-10-17 ?No data recorded ? ?Encounter Date: 05/23/2021 ? ? PT End of Session - 05/23/21 1249   ? ? Visit Number 21   ? Number of Visits 25   ? Date for PT Re-Evaluation 06/06/21   ? Authorization Type next session 1/10 PN 3/13   ? Authorization Time Period 03/14/2021-06/06/2021   ? Progress Note Due on Visit 20   ? PT Start Time 1105   ? PT Stop Time 1151   ? PT Time Calculation (min) 46 min   ? Equipment Utilized During Treatment Gait belt   ? Activity Tolerance Patient tolerated treatment well;No increased pain   ? Behavior During Therapy [facility] for tasks assessed/performed   ? ?  ?  ? ?  ? ? ?Past Medical History:  ?Diagnosis Date  ? A-fib ([place])   ? Cancer ([place]) 10/25/2019  ? Prostate  ? 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 Dysrhythmia   ? Pulmonary embolism ([place])   ? Skin cancer   ? TIA (transient ischemic attack)   ? ? ?Past Surgical History:  ?Procedure Laterality Date  ? APPENDECTOMY    ? COLONOSCOPY WITH PROPOFOL N/A 11/23/2019  ? Procedure: COLONOSCOPY WITH PROPOFOL;  Surgeon: Toledo, Benay Pike, MD;  Location: [facility] ENDOSCOPY;  Service: Gastroenterology;  Laterality: N/A;  ? cyber knife surgery    ? PROSTATE SURGERY    ? ? ?There were no vitals filed for this visit. ? ? Subjective Assessment - 05/23/21 1247   ? ? Subjective Pt reports his knee pain has been "on and off." He has no pain currently. Pt using walking pole today.   ? Pertinent History Pt is a 76 y/o male, returning to PT following admission to ED for evaluation of weakness s/p fall where pt was found to be COVID positive. Pt was in the hospital from 02/17/2021-02/23/2021. Pt was then d/c to home where he received [facility]. Pt reports increased LBP since d/c from hospital and decreased balance since  last in outpatient PT. While pt using QC currently, he reports he has been using a RW at home or grabbing onto the wall to steady himself while walking. Pt reports he has been walking home distances. PMH significant for TIA, a-fib, pulmonary embolism, TIA, gout, rhabdomyolysis.   ? Limitations Standing;Walking;House hold activities;Lifting   ? How long can you sit comfortably? not limited   ? How long can you stand comfortably? pt reports limited due to feeling unsteady   ? How long can you walk comfortably? Pt reports he is currently walking household distances   ? Patient Stated Goals Pt would like to improve his walking and balance   ? Currently in Pain? Yes   ? Pain Location Knee   ? Pain Orientation Left   ? Pain Onset In the past 7 days   ? ?  ?  ? ?  ? ? ?INTERVENTIONS- ? ?Manual -  ? ?PROM ?Knee to chest stretch 2x30 sec BLEs ?Hamstring stretch- 2x30-45 sec each LE ?LTRs 20x each LE with 2 sec hold at end ranges; pt reports feels good to low back ? ?TherEx- ?Seated: ?LAQ 20x each LE. ?--progressed to 20 additional reps with 4# weights donned to each LE ?Standing hip abduction 20x each LE ?Standing heel raises  2x20 B ? ?Treadmill LE muscular and cardiorespiratory endurance training - HIIT - ?Pt performs for 5 minutes, First couple minutes performed at 2% incline. Pt ambulates up to 2.3-2.6 mph in 30 sec intervals (baseline 1.6-1.7 mph). Pt fatigued by end of intervention, requiring rest break.  ? ?Supine:  ?Glute bridge 15x ?SLR 10x each LE ? ?Pt educated throughout session about proper posture and technique with exercises. Improved exercise technique, movement at target joints, use of target muscles after min to mod verbal, visual, tactile cues. ? ? ? PT Education - 05/23/21 1248   ? ? Education Details exercise technique, body mechanics   ? Person(s) Educated Patient   ? Methods Explanation;Demonstration;Verbal cues   ? Comprehension Verbalized understanding;Returned demonstration;Verbal cues required;Need  further instruction   ? ?  ?  ? ?  ? ? ? PT Short Term Goals - 05/20/21 1042   ? ?  ? PT SHORT TERM GOAL #1  ? Title Patient will be independent in home exercise program to improve balance, and strength/mobility for better functional independence with ADLs and decreased fall risk.   ? Baseline 03/14/21: to be initiated within next 1-2 sessions 2/6: HEP compliance   ? Time 6   ? Period Weeks   ? Status Achieved   ? Target Date 06/06/21   ?  ? PT SHORT TERM GOAL #2  ? Title Pt will improve gross BLE strength to 5/5 in order to increase ease and safety with mobility and ADLs.   ? Baseline 03/14/21: strength 4+/5 B, greatest deficit in B ankle DFs 4-/5 B 2/6: grossly 4+/5 with df 4/5 3/13: see note   ? Time 6   ? Period Weeks   ? Status Partially Met   ? Target Date 04/25/21   ? ?  ?  ? ?  ? ? ? ? PT Long Term Goals - 05/20/21 1020   ? ?  ? PT LONG TERM GOAL #1  ? Title Patient will increase FOTO score to greater than 59 to demonstrate statistically significant improvement in mobility and quality of life.   ? Baseline 03/14/21: 58 2/6: 57% 3/13: 57.4%   ? Time 12   ? Period Weeks   ? Status Partially Met   ? Target Date 06/06/21   ?  ? PT LONG TERM GOAL #2  ? Title Patient (> 68 years old) will complete five times sit to stand test in < 10 seconds indicating increased LE strength and decreased fall risk   ? Baseline 03/14/21: 12 seconds 2/6: 12 seconds 3/13: 13.8 seconds without hands   ? Time 12   ? Period Weeks   ? Status On-going   ? Target Date 06/06/21   ?  ? PT LONG TERM GOAL #3  ? Title Pt will improve DGI by at least 3 points in order to demonstrate clinically significant improvement in balance and decreased risk for falls.   ? Baseline 03/14/21: 15/24 2/6: 17/24 3/13: 19/24   ? Time 12   ? Period Weeks   ? Status Partially Met   ? Target Date 06/06/21   ?  ? PT LONG TERM GOAL #4  ? Title Pt will increase 6MWT by at least 81m(16104f in order to demonstrate clinically significant improvement in cardiopulmonary endurance  and community ambulation   ? Baseline 03/14/21: 795 ft with QC 2/6: 865 ft without AD 3/13: 770 ft with walking stick with knee pain   ? Time 12   ? Period  Weeks   ? Status Partially Met   ? Target Date 06/06/21   ?  ? PT LONG TERM GOAL #5  ? Title The patient will be able to maintain >5 seconds of SLB on BLEs in order to decrease risk of falls when navigating obstacles, curbs and steps.   ? Baseline 03/14/21: RLE 4 seconds, LLE 2 seconds 2/6: L 3 seconds R 4 seconds 3/13: L 4 seconds with pain R 15 seconds   ? Time 12   ? Period Weeks   ? Status Partially Met   ? Target Date 06/06/21   ?  ? PT LONG TERM GOAL #6  ? Title Patient will increase 10 meter walk test to at least 1.0 m/s with LRD as to improve gait speed for better community ambulation and to reduce fall risk.   ? Baseline 03/14/21: 0.67 m/s 2/6: 1.3 m/s   ? Time 12   ? Period Weeks   ? Status Achieved   ? Target Date 06/06/21   ? ?  ?  ? ?  ? ? ? ? ? ? ? ? Plan - 05/23/21 1514   ? ? Clinical Impression Statement Pt highly motivated to participate in session. He tolerates interventions well without pain. Pt exhibits good activity tolerance by performing multiple strenghtening interventions with minimal rest breaks. Pt most challenged with treadmill endurance HIIT and glute bridges on this date. The pt will benefit from further skilled PT to continue to improve LE strength, balance and mobility to decrease fall risk and increase QOL.   ? Personal Factors and Comorbidities Age;Past/Current Experience;Comorbidity 3+   ? Comorbidities A-fib, lumbar disc disease, prostate CA, TIA, gout, hx of pulmonary embolism   ? Examination-Activity Limitations Carry;Lift;Stand;Locomotion Level;Bend;Dressing;Reach Overhead;Squat;Transfers;Caring for Others;Stairs   ? Examination-Participation Restrictions Community Activity;Yard Work;Cleaning;Laundry;Shop;Church   ? Stability/Clinical Decision Making Evolving/Moderate complexity   ? Rehab Potential Good   ? PT Frequency 2x / week    ? PT Duration 12 weeks   ? PT Treatment/Interventions ADLs/Self Care Home Management;Biofeedback;Aquatic Therapy;Canalith Repostioning;Cryotherapy;Electrical Stimulation;Iontophoresis 53m/ml Dexamet

## 2021-05-27 ENCOUNTER — Other Ambulatory Visit: Payer: Self-pay

## 2021-05-27 ENCOUNTER — Ambulatory Visit: Payer: Medicare Other

## 2021-05-27 DIAGNOSIS — R2681 Unsteadiness on feet: Secondary | ICD-10-CM

## 2021-05-27 DIAGNOSIS — M6281 Muscle weakness (generalized): Secondary | ICD-10-CM

## 2021-05-27 DIAGNOSIS — R2689 Other abnormalities of gait and mobility: Secondary | ICD-10-CM

## 2021-05-27 NOTE — Therapy (Signed)
[place] ?[place], [name], [address] ?Phone: [phone]   Fax:  [phone] ? ?Physical Therapy Treatment ? ?Patient Details  ?Name: Nicholas [name] ?MRN: [id number] ?Date of Birth: [birth year] ?No data recorded ? ?Encounter Date: 05/27/2021 ? ? PT End of Session - 05/27/21 1046   ? ? Visit Number 22   ? Number of Visits 25   ? Date for PT Re-Evaluation 06/06/21   ? Authorization Type 2/10 PN 3/13   ? Authorization Time Period 03/14/2021-06/06/2021   ? Progress Note Due on Visit 20   ? PT Start Time 1015   ? PT Stop Time 1100   ? PT Time Calculation (min) 45 min   ? Equipment Utilized During Treatment Gait belt   ? Activity Tolerance Patient tolerated treatment well;No increased pain   ? Behavior During Therapy [facility] for tasks assessed/performed   ? ?  ?  ? ?  ? ? ?Past Medical History:  ?Diagnosis Date  ? A-fib ([place])   ? Cancer ([place]) 10/25/2019  ? Prostate  ? 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 Dysrhythmia   ? Pulmonary embolism ([place])   ? Skin cancer   ? TIA (transient ischemic attack)   ? ? ?Past Surgical History:  ?Procedure Laterality Date  ? APPENDECTOMY    ? COLONOSCOPY WITH PROPOFOL N/A 11/23/2019  ? Procedure: COLONOSCOPY WITH PROPOFOL;  Surgeon: Toledo, Benay Pike, MD;  Location: [facility] ENDOSCOPY;  Service: Gastroenterology;  Laterality: N/A;  ? cyber knife surgery    ? PROSTATE SURGERY    ? ? ?There were no vitals filed for this visit. ? ? Subjective Assessment - 05/27/21 1029   ? ? Subjective Patient reports his knee pain has been improving. No pain currently.   ? Pertinent History Pt is a 76 y/o male, returning to PT following admission to ED for evaluation of weakness s/p fall where pt was found to be COVID positive. Pt was in the hospital from 02/17/2021-02/23/2021. Pt was then d/c to home where he received [facility]. Pt reports increased LBP since d/c from hospital and decreased balance since last in outpatient PT. While pt using QC  currently, he reports he has been using a RW at home or grabbing onto the wall to steady himself while walking. Pt reports he has been walking home distances. PMH significant for TIA, a-fib, pulmonary embolism, TIA, gout, rhabdomyolysis.   ? Limitations Standing;Walking;House hold activities;Lifting   ? How long can you sit comfortably? not limited   ? How long can you stand comfortably? pt reports limited due to feeling unsteady   ? How long can you walk comfortably? Pt reports he is currently walking household distances   ? Patient Stated Goals Pt would like to improve his walking and balance   ? Currently in Pain? No/denies   ? ?  ?  ? ?  ? ? ? ? ? ? ? ?INTERVENTIONS - ?  ?Neuro Re-ed ?Speed ladder:  ?-one foot each square 10x length of // bars with no UE support ? ?Airex balance beam: ?-lateral stepping 6x ; very challenging with forward and backwards LOB ; occasional touch to bar to stabilize self ?-tandem walk with BUE support 8x length  ?-static stand with PVC pipe: chest press 10x overhead press 10x ? ?[place] 4x length of // bars ? ?TherEx:  ?Standing ?With 5# weights donned each LE: ?Step ups (alternating) onto 6" step at stairs, BUE support 12x each LE  ? ? ?  Standing with # 5lb ankle weight: CGA for stability ? ?-Hip extension with B upper extremity support, cueing for neutral hip alignment, upright posture for optimal muscle recruitment, and sequencing, 10x each LE,  ?-Hip abduction with B upper extremity support, cueing for neutral foot alignment for correct muscle activation, 10x each LE ?-Hip flexion with B upper extremity support, cueing for body mechanics, speed of muscle recruitment for optimal strengthening and stabilization 10x each LE ?-Hamstring curl with B upper extremity support, cueing for knee alignment for recruitment of hamstring musculature, 10x each LE ? ? ?Seated with # 5lb ankle weights  ?-Seated marches with upright posture, back away from back of chair for abdominal/trunk  activation/stabilization, 10x each LE ?-Seated LAQ with 3 second holds, 10x each LE, cueing for muscle activation and sequencing for neutral alignment ?-Seated IR/ER with cueing for stabilizing knee placement with lateral foot movement for optimal muscle recruitment, 10x each LE ?-heel raises 20x  ?  ?Walking hamstring lengthening stretch 2x length of //bars ?Seated hamstring lengthening 60 seconds each LE  ?  ?  ?Pt educated throughout session about proper posture and technique with exercises. Improved exercise technique, movement at target joints, use of target muscles after min to mod verbal, visual, tactile cues. ? ? ? ? ?Patient tolerates progressive strengthening and stabilization well with occasional instability and fatigue. Unstable surfaces challenge ankle righting reactions when paired with UE task. He remains highly motivated throughout session with minimal pain increase. The pt will benefit from further skilled PT to continue to improve LE strength, balance and mobility to decrease fall risk and increase QOL. ? ? ? ? ? ? ? ? ? ? ? ? ? ? ? ? PT Education - 05/27/21 1046   ? ? Education Details exercise technique, body mechanics   ? Person(s) Educated Patient   ? Methods Explanation;Tactile cues;Demonstration;Verbal cues   ? Comprehension Verbalized understanding;Returned demonstration;Verbal cues required;Tactile cues required   ? ?  ?  ? ?  ? ? ? PT Short Term Goals - 05/20/21 1042   ? ?  ? PT SHORT TERM GOAL #1  ? Title Patient will be independent in home exercise program to improve balance, and strength/mobility for better functional independence with ADLs and decreased fall risk.   ? Baseline 03/14/21: to be initiated within next 1-2 sessions 2/6: HEP compliance   ? Time 6   ? Period Weeks   ? Status Achieved   ? Target Date 06/06/21   ?  ? PT SHORT TERM GOAL #2  ? Title Pt will improve gross BLE strength to 5/5 in order to increase ease and safety with mobility and ADLs.   ? Baseline 03/14/21: strength  4+/5 B, greatest deficit in B ankle DFs 4-/5 B 2/6: grossly 4+/5 with df 4/5 3/13: see note   ? Time 6   ? Period Weeks   ? Status Partially Met   ? Target Date 04/25/21   ? ?  ?  ? ?  ? ? ? ? PT Long Term Goals - 05/20/21 1020   ? ?  ? PT LONG TERM GOAL #1  ? Title Patient will increase FOTO score to greater than 59 to demonstrate statistically significant improvement in mobility and quality of life.   ? Baseline 03/14/21: 58 2/6: 57% 3/13: 57.4%   ? Time 12   ? Period Weeks   ? Status Partially Met   ? Target Date 06/06/21   ?  ? PT LONG TERM GOAL #  2  ? Title Patient (> 37 years old) will complete five times sit to stand test in < 10 seconds indicating increased LE strength and decreased fall risk   ? Baseline 03/14/21: 12 seconds 2/6: 12 seconds 3/13: 13.8 seconds without hands   ? Time 12   ? Period Weeks   ? Status On-going   ? Target Date 06/06/21   ?  ? PT LONG TERM GOAL #3  ? Title Pt will improve DGI by at least 3 points in order to demonstrate clinically significant improvement in balance and decreased risk for falls.   ? Baseline 03/14/21: 15/24 2/6: 17/24 3/13: 19/24   ? Time 12   ? Period Weeks   ? Status Partially Met   ? Target Date 06/06/21   ?  ? PT LONG TERM GOAL #4  ? Title Pt will increase 6MWT by at least 70m(165f in order to demonstrate clinically significant improvement in cardiopulmonary endurance and community ambulation   ? Baseline 03/14/21: 795 ft with QC 2/6: 865 ft without AD 3/13: 770 ft with walking stick with knee pain   ? Time 12   ? Period Weeks   ? Status Partially Met   ? Target Date 06/06/21   ?  ? PT LONG TERM GOAL #5  ? Title The patient will be able to maintain >5 seconds of SLB on BLEs in order to decrease risk of falls when navigating obstacles, curbs and steps.   ? Baseline 03/14/21: RLE 4 seconds, LLE 2 seconds 2/6: L 3 seconds R 4 seconds 3/13: L 4 seconds with pain R 15 seconds   ? Time 12   ? Period Weeks   ? Status Partially Met   ? Target Date 06/06/21   ?  ? PT LONG TERM  GOAL #6  ? Title Patient will increase 10 meter walk test to at least 1.0 m/s with LRD as to improve gait speed for better community ambulation and to reduce fall risk.   ? Baseline 03/14/21: 0.67 m/s 2/6: 1.3 m/s   ?

## 2021-05-28 ENCOUNTER — Ambulatory Visit: Payer: Medicare Other

## 2021-05-30 ENCOUNTER — Ambulatory Visit: Payer: Medicare Other

## 2021-05-30 ENCOUNTER — Other Ambulatory Visit: Payer: Self-pay

## 2021-05-30 DIAGNOSIS — R2689 Other abnormalities of gait and mobility: Secondary | ICD-10-CM

## 2021-05-30 DIAGNOSIS — R278 Other lack of coordination: Secondary | ICD-10-CM

## 2021-05-30 DIAGNOSIS — M6281 Muscle weakness (generalized): Secondary | ICD-10-CM

## 2021-05-30 DIAGNOSIS — R2681 Unsteadiness on feet: Secondary | ICD-10-CM

## 2021-05-30 DIAGNOSIS — M25562 Pain in left knee: Secondary | ICD-10-CM

## 2021-05-30 NOTE — Therapy (Signed)
[place] [place] [facility] ?[place], [name], [address] ?Phone: [phone]   Fax:  [phone] ? ?Physical Therapy Treatment ? ?Patient Details  ?Name: Nicholas [name] ?MRN: [id number] ?Date of Birth: 12/15/1945 ?No data recorded ? ?Encounter Date: 05/30/2021 ? ? PT End of Session - 05/30/21 1202   ? ? Visit Number 23   ? Number of Visits 25   ? Date for PT Re-Evaluation 06/06/21   ? Authorization Type 2/10 PN 3/13   ? Authorization Time Period 03/14/2021-06/06/2021   ? Progress Note Due on Visit 20   ? PT Start Time 1102   ? PT Stop Time 1146   ? PT Time Calculation (min) 44 min   ? Equipment Utilized During Treatment Gait belt   ? Activity Tolerance Patient tolerated treatment well   ? Behavior During Therapy [facility] for tasks assessed/performed   ? ?  ?  ? ?  ? ? ?Past Medical History:  ?Diagnosis Date  ? A-fib ([place])   ? Cancer ([place]) 10/25/2019  ? Prostate  ? 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 Dysrhythmia   ? Pulmonary embolism ([place])   ? Skin cancer   ? TIA (transient ischemic attack)   ? ? ?Past Surgical History:  ?Procedure Laterality Date  ? APPENDECTOMY    ? COLONOSCOPY WITH PROPOFOL N/A 11/23/2019  ? Procedure: COLONOSCOPY WITH PROPOFOL;  Surgeon: Toledo, Benay Pike, MD;  Location: [facility] ENDOSCOPY;  Service: Gastroenterology;  Laterality: N/A;  ? cyber knife surgery    ? PROSTATE SURGERY    ? ? ?There were no vitals filed for this visit. ? ? Subjective Assessment - 05/30/21 1201   ? ? Subjective Pt reports intermittent knee pain. He is unsure if it is associated with a particular movement or activity. Pt does report improvement in pain with walking.   ? Pertinent History Pt is a 76 y/o male, returning to PT following admission to ED for evaluation of weakness s/p fall where pt was found to be COVID positive. Pt was in the hospital from 02/17/2021-02/23/2021. Pt was then d/c to home where he received [facility] PT. Pt reports increased LBP since d/c from hospital and  decreased balance since last in outpatient PT. While pt using QC currently, he reports he has been using a RW at home or grabbing onto the wall to steady himself while walking. Pt reports he has been walking home distances. PMH significant for TIA, a-fib, pulmonary embolism, TIA, gout, rhabdomyolysis.   ? Limitations Standing;Walking;House hold activities;Lifting   ? How long can you sit comfortably? not limited   ? How long can you stand comfortably? pt reports limited due to feeling unsteady   ? How long can you walk comfortably? Pt reports he is currently walking household distances   ? Patient Stated Goals Pt would like to improve his walking and balance   ? Currently in Pain? No/denies   ? Pain Onset In the past 7 days   ? ?  ?  ? ?  ?  ?INTERVENTIONS - ?  ?  ?TherEx:  ? ?Treadmill LE muscular and cardiorespiratory endurance training - HIIT - ?Pt performs for 7.5 minutes, performed at 2% incline. Pt ambulates up to 2.3-2.7 mph in 20-30 sec intervals (baseline 1.6-1.7 mph). Pt fatigued by end of intervention, requiring rest break. Pt reports no knee pain with intervention.   ?  ?Standing with 5lb ankle weight: CGA for stability ?  ?-Hip extension with B upper  extremity support, cueing for neutral hip alignment, upright posture, 10x each.  LE,  ?-Hip abduction with B upper extremity support, 10x each LE ?Pt rates as easy. ? ?-Hip flexion with B upper extremity support, cueing for body mechanics,  16x each LE ? ?-Hamstring curl with B upper extremity support 16  reps each LE ?  ?Standing heel raises 22x B ? ?NMR: with gait belt donned and CGA-min a provided throughout ? ?Standing on airex semi-tandem 30 sec each position; intermittent UE support  ? ?Slow marches on airex 30x; cuing for for control/decreased speed to promote SLB ? ?Basketball dynamic challenge, standing on airex pad in semi-tandem x 2 rounds. Pt with greater ease with RLE as primary support LE. Pt does require intermittent UE support throughout,  mainly due to posterior LOB.  ? ?  ?Pt educated throughout session about proper posture and technique with exercises. Improved exercise technique, movement at target joints, use of target muscles after min to mod verbal, visual, tactile cues. ? ? ? ? PT Education - 05/30/21 1202   ? ? Education Details exercise technique, body mechanics   ? Person(s) Educated Patient   ? Methods Explanation;Demonstration;Verbal cues   ? Comprehension Verbalized understanding;Returned demonstration;Verbal cues required;Need further instruction   ? ?  ?  ? ?  ? ? ? PT Short Term Goals - 05/20/21 1042   ? ?  ? PT SHORT TERM GOAL #1  ? Title Patient will be independent in home exercise program to improve balance, and strength/mobility for better functional independence with ADLs and decreased fall risk.   ? Baseline 03/14/21: to be initiated within next 1-2 sessions 2/6: HEP compliance   ? Time 6   ? Period Weeks   ? Status Achieved   ? Target Date 06/06/21   ?  ? PT SHORT TERM GOAL #2  ? Title Pt will improve gross BLE strength to 5/5 in order to increase ease and safety with mobility and ADLs.   ? Baseline 03/14/21: strength 4+/5 B, greatest deficit in B ankle DFs 4-/5 B 2/6: grossly 4+/5 with df 4/5 3/13: see note   ? Time 6   ? Period Weeks   ? Status Partially Met   ? Target Date 04/25/21   ? ?  ?  ? ?  ? ? ? ? PT Long Term Goals - 05/20/21 1020   ? ?  ? PT LONG TERM GOAL #1  ? Title Patient will increase FOTO score to greater than 59 to demonstrate statistically significant improvement in mobility and quality of life.   ? Baseline 03/14/21: 58 2/6: 57% 3/13: 57.4%   ? Time 12   ? Period Weeks   ? Status Partially Met   ? Target Date 06/06/21   ?  ? PT LONG TERM GOAL #2  ? Title Patient (> 12 years old) will complete five times sit to stand test in < 10 seconds indicating increased LE strength and decreased fall risk   ? Baseline 03/14/21: 12 seconds 2/6: 12 seconds 3/13: 13.8 seconds without hands   ? Time 12   ? Period Weeks   ? Status  On-going   ? Target Date 06/06/21   ?  ? PT LONG TERM GOAL #3  ? Title Pt will improve DGI by at least 3 points in order to demonstrate clinically significant improvement in balance and decreased risk for falls.   ? Baseline 03/14/21: 15/24 2/6: 17/24 3/13: 19/24   ? Time 12   ?  Period Weeks   ? Status Partially Met   ? Target Date 06/06/21   ?  ? PT LONG TERM GOAL #4  ? Title Pt will increase 6MWT by at least 103m(1646f in order to demonstrate clinically significant improvement in cardiopulmonary endurance and community ambulation   ? Baseline 03/14/21: 795 ft with QC 2/6: 865 ft without AD 3/13: 770 ft with walking stick with knee pain   ? Time 12   ? Period Weeks   ? Status Partially Met   ? Target Date 06/06/21   ?  ? PT LONG TERM GOAL #5  ? Title The patient will be able to maintain >5 seconds of SLB on BLEs in order to decrease risk of falls when navigating obstacles, curbs and steps.   ? Baseline 03/14/21: RLE 4 seconds, LLE 2 seconds 2/6: L 3 seconds R 4 seconds 3/13: L 4 seconds with pain R 15 seconds   ? Time 12   ? Period Weeks   ? Status Partially Met   ? Target Date 06/06/21   ?  ? PT LONG TERM GOAL #6  ? Title Patient will increase 10 meter walk test to at least 1.0 m/s with LRD as to improve gait speed for better community ambulation and to reduce fall risk.   ? Baseline 03/14/21: 0.67 m/s 2/6: 1.3 m/s   ? Time 12   ? Period Weeks   ? Status Achieved   ? Target Date 06/06/21   ? ?  ?  ? ?  ? ? ? ? ? ? ? ? Plan - 05/30/21 1202   ? ? Clinical Impression Statement Pt able to progress resistance and endurance training interventions without pain or significant fatigue. Pt resumed dynamic balance training on compliant surfaces that challenged ankle righting. Pt with no pain during intervention. However, following rest break after exercise pt stood up and experienced mild knee pain. PT instructed pt in performing his stretching HEP for pain modulation. Pt verbalized understanding. Will continue to monitor knee  symptoms and modify interventions as needed. The pt will continue to benefit from further skilled PT to improve strength, endurance and balance to increase ease and safety with ADLs.   ? Personal Factors and

## 2021-06-03 ENCOUNTER — Other Ambulatory Visit: Payer: Self-pay

## 2021-06-03 ENCOUNTER — Ambulatory Visit: Payer: Medicare Other

## 2021-06-03 DIAGNOSIS — R2689 Other abnormalities of gait and mobility: Secondary | ICD-10-CM

## 2021-06-03 DIAGNOSIS — R2681 Unsteadiness on feet: Secondary | ICD-10-CM | POA: Diagnosis not present

## 2021-06-03 DIAGNOSIS — M6281 Muscle weakness (generalized): Secondary | ICD-10-CM

## 2021-06-03 NOTE — Therapy (Signed)
[facility] ?[place], [name], [address] ?Phone: [phone]   Fax:  [phone] ? ?Physical Therapy Treatment ? ?Patient Details  ?Name: Nicholas [name] ?MRN: [id number] ?Date of Birth: May 21, 1945 ?No data recorded ? ?Encounter Date: 06/03/2021 ? ? PT End of Session - 06/03/21 1004   ? ? Visit Number 24   ? Number of Visits 25   ? Date for PT Re-Evaluation 06/06/21   ? Authorization Type 4/10 PN 3/13   ? Authorization Time Period 03/14/2021-06/06/2021   ? Progress Note Due on Visit 20   ? PT Start Time 1014   ? PT Stop Time 1100   ? PT Time Calculation (min) 46 min   ? Equipment Utilized During Treatment Gait belt   ? Activity Tolerance Patient tolerated treatment well   ? Behavior During Therapy [facility] for tasks assessed/performed   ? ?  ?  ? ?  ? ? ?Past Medical History:  ?Diagnosis Date  ? A-fib ([place])   ? Cancer ([place]) 10/25/2019  ? Prostate  ? 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 Dysrhythmia   ? Pulmonary embolism ([place])   ? Skin cancer   ? TIA (transient ischemic attack)   ? ? ?Past Surgical History:  ?Procedure Laterality Date  ? APPENDECTOMY    ? COLONOSCOPY WITH PROPOFOL N/A 11/23/2019  ? Procedure: COLONOSCOPY WITH PROPOFOL;  Surgeon: Toledo, Benay Pike, MD;  Location: [facility] ENDOSCOPY;  Service: Gastroenterology;  Laterality: N/A;  ? cyber knife surgery    ? PROSTATE SURGERY    ? ? ?There were no vitals filed for this visit. ? ? Subjective Assessment - 06/03/21 1018   ? ? Subjective Patient reports his back is sore but not painful. Knee is doing better.   ? Pertinent History Pt is a 76 y/o male, returning to PT following admission to ED for evaluation of weakness s/p fall where pt was found to be COVID positive. Pt was in the hospital from 02/17/2021-02/23/2021. Pt was then d/c to home where he received [facility]. Pt reports increased LBP since d/c from hospital and decreased balance since last in outpatient PT. While pt using QC currently, he  reports he has been using a RW at home or grabbing onto the wall to steady himself while walking. Pt reports he has been walking home distances. PMH significant for TIA, a-fib, pulmonary embolism, TIA, gout, rhabdomyolysis.   ? Limitations Standing;Walking;House hold activities;Lifting   ? How long can you sit comfortably? not limited   ? How long can you stand comfortably? pt reports limited due to feeling unsteady   ? How long can you walk comfortably? Pt reports he is currently walking household distances   ? Patient Stated Goals Pt would like to improve his walking and balance   ? Currently in Pain? No/denies   ? ?  ?  ? ?  ? ? ? ? ? ? ?  ?INTERVENTIONS - ?  ?Neuro Re-ed ?Speed ladder:  ?-one foot each square 10x length of // bars with 5lb ankle weights; focus on high knees ?-lateral stepping 8x length of // bars with 5lb ankle weights ;  ?  ?Airex balance beam: ?-lateral stepping 4x ; very challenging with forward and backwards LOB ; occasional touch to bar to stabilize self ?-tandem walk with BUE support 4x length of // bars  ?-static stand with PVC pipe: chest press 10x overhead press 10x ? ?Bosu ball:  ?-modified forward lunge 10x  each LE, UE support  ?  ?TherEx:  ?Standing ?Seated with # 5lb ankle weights  ?-Seated marches with upright posture, back away from back of chair for abdominal/trunk activation/stabilization, 15x each LE ?-Seated LAQ with 3 second holds, 10x each LE, cueing for muscle activation and sequencing for neutral alignment ?-heel raises 20x  ?  ? ?Ambulate 160 ft with cues for upright posture, larger step and arm swing; x2 sets ?Superset with 10x STS ; x 2 sets  ? ? yellow ball: ?-seated LAQ with adduction between feet 10x  ?-rainbow cross body taps 10x seated for core activation ? ?Pt educated throughout session about proper posture and technique with exercises. Improved exercise technique, movement at target joints, use of target muscles after min to mod verbal, visual, tactile  cues. ? ? ?Patient presents to physical therapy with excellent motivation. Patient tolerates all interventions well; becoming fatigued with superset of ambulation with sit to stands. Intermittent back discomfort noted throughout session. The pt will continue to benefit from further skilled PT to improve strength, endurance and balance to increase ease and safety with ADLs. ? ? ? ? ? ? ? ? ? ? ? ? ? ? ? ? ? ? ? PT Education - 06/03/21 1003   ? ? Education Details exercise technique, body mechanics   ? Person(s) Educated Patient   ? Methods Explanation;Demonstration;Tactile cues;Verbal cues   ? Comprehension Verbalized understanding;Returned demonstration;Verbal cues required;Tactile cues required   ? ?  ?  ? ?  ? ? ? PT Short Term Goals - 05/20/21 1042   ? ?  ? PT SHORT TERM GOAL #1  ? Title Patient will be independent in home exercise program to improve balance, and strength/mobility for better functional independence with ADLs and decreased fall risk.   ? Baseline 03/14/21: to be initiated within next 1-2 sessions 2/6: HEP compliance   ? Time 6   ? Period Weeks   ? Status Achieved   ? Target Date 06/06/21   ?  ? PT SHORT TERM GOAL #2  ? Title Pt will improve gross BLE strength to 5/5 in order to increase ease and safety with mobility and ADLs.   ? Baseline 03/14/21: strength 4+/5 B, greatest deficit in B ankle DFs 4-/5 B 2/6: grossly 4+/5 with df 4/5 3/13: see note   ? Time 6   ? Period Weeks   ? Status Partially Met   ? Target Date 04/25/21   ? ?  ?  ? ?  ? ? ? ? PT Long Term Goals - 05/20/21 1020   ? ?  ? PT LONG TERM GOAL #1  ? Title Patient will increase FOTO score to greater than 59 to demonstrate statistically significant improvement in mobility and quality of life.   ? Baseline 03/14/21: 58 2/6: 57% 3/13: 57.4%   ? Time 12   ? Period Weeks   ? Status Partially Met   ? Target Date 06/06/21   ?  ? PT LONG TERM GOAL #2  ? Title Patient (> 70 years old) will complete five times sit to stand test in < 10 seconds  indicating increased LE strength and decreased fall risk   ? Baseline 03/14/21: 12 seconds 2/6: 12 seconds 3/13: 13.8 seconds without hands   ? Time 12   ? Period Weeks   ? Status On-going   ? Target Date 06/06/21   ?  ? PT LONG TERM GOAL #3  ? Title Pt will improve DGI by  at least 3 points in order to demonstrate clinically significant improvement in balance and decreased risk for falls.   ? Baseline 03/14/21: 15/24 2/6: 17/24 3/13: 19/24   ? Time 12   ? Period Weeks   ? Status Partially Met   ? Target Date 06/06/21   ?  ? PT LONG TERM GOAL #4  ? Title Pt will increase 6MWT by at least 28m(1620f in order to demonstrate clinically significant improvement in cardiopulmonary endurance and community ambulation   ? Baseline 03/14/21: 795 ft with QC 2/6: 865 ft without AD 3/13: 770 ft with walking stick with knee pain   ? Time 12   ? Period Weeks   ? Status Partially Met   ? Target Date 06/06/21   ?  ? PT LONG TERM GOAL #5  ? Title The patient will be able to maintain >5 seconds of SLB on BLEs in order to decrease risk of falls when navigating obstacles, curbs and steps.   ? Baseline 03/14/21: RLE 4 seconds, LLE 2 seconds 2/6: L 3 seconds R 4 seconds 3/13: L 4 seconds with pain R 15 seconds   ? Time 12   ? Period Weeks   ? Status Partially Met   ? Target Date 06/06/21   ?  ? PT LONG TERM GOAL #6  ? Title Patient will increase 10 meter walk test to at least 1.0 m/s with LRD as to improve gait speed for better community ambulation and to reduce fall risk.   ? Baseline 03/14/21: 0.67 m/s 2/6: 1.3 m/s   ? Time 12   ? Period Weeks   ? Status Achieved   ? Target Date 06/06/21   ? ?  ?  ? ?  ? ? ? ? ? ? ? ? Plan - 06/03/21 1111   ? ? Clinical Impression Statement Patient presents to physical therapy with excellent motivation. Patient tolerates all interventions well; becoming fatigued with superset of ambulation with sit to stands. Intermittent back discomfort noted throughout session. The pt will continue to benefit from further  skilled PT to improve strength, endurance and balance to increase ease and safety with ADLs.   ? Personal Factors and Comorbidities Age;Past/Current Experience;Comorbidity 3+   ? Comorbidities A-fib, lumbar disc disease, prost

## 2021-06-04 ENCOUNTER — Ambulatory Visit: Payer: Medicare Other

## 2021-06-06 ENCOUNTER — Ambulatory Visit: Payer: Medicare Other

## 2021-06-06 DIAGNOSIS — R2681 Unsteadiness on feet: Secondary | ICD-10-CM | POA: Diagnosis not present

## 2021-06-06 DIAGNOSIS — R2689 Other abnormalities of gait and mobility: Secondary | ICD-10-CM

## 2021-06-06 DIAGNOSIS — M6281 Muscle weakness (generalized): Secondary | ICD-10-CM

## 2021-06-06 NOTE — Therapy (Signed)
[facility] ?[place], [name], [address] ?Phone: [phone]   Fax:  [phone] ? ?Physical Therapy Treatment/RECERT ? ?Patient Details  ?Name: Nicholas [name] ?MRN: [id number] ?Date of Birth: 1945/08/10 ?No data recorded ? ?Encounter Date: 06/06/2021 ? ? PT End of Session - 06/06/21 1200   ? ? Visit Number 25   ? Number of Visits 49   ? Date for PT Re-Evaluation 08/29/21   ? Authorization Type 4/10 PN 3/13   ? Authorization Time Period 03/14/2021-06/06/2021   ? Progress Note Due on Visit 20   ? PT Start Time 1101   ? PT Stop Time 1146   ? PT Time Calculation (min) 45 min   ? Equipment Utilized During Treatment Gait belt   ? Activity Tolerance Patient tolerated treatment well   ? Behavior During Therapy [facility] for tasks assessed/performed   ? ?  ?  ? ?  ? ? ?Past Medical History:  ?Diagnosis Date  ? A-fib ([place])   ? Cancer ([place]) 10/25/2019  ? Prostate  ? 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 Dysrhythmia   ? Pulmonary embolism ([place])   ? Skin cancer   ? TIA (transient ischemic attack)   ? ? ?Past Surgical History:  ?Procedure Laterality Date  ? APPENDECTOMY    ? COLONOSCOPY WITH PROPOFOL N/A 11/23/2019  ? Procedure: COLONOSCOPY WITH PROPOFOL;  Surgeon: Toledo, Benay Pike, MD;  Location: [facility] ENDOSCOPY;  Service: Gastroenterology;  Laterality: N/A;  ? cyber knife surgery    ? PROSTATE SURGERY    ? ? ?There were no vitals filed for this visit. ? ? Subjective Assessment - 06/06/21 1105   ? ? Subjective Pt reports some back stiffness over the past few days. He is unsure why.   ? Pertinent History Pt is a 76 y/o male, returning to PT following admission to ED for evaluation of weakness s/p fall where pt was found to be COVID positive. Pt was in the hospital from 02/17/2021-02/23/2021. Pt was then d/c to home where he received [facility]. Pt reports increased LBP since d/c from hospital and decreased balance since last in outpatient PT. While pt using QC currently,  he reports he has been using a RW at home or grabbing onto the wall to steady himself while walking. Pt reports he has been walking home distances. PMH significant for TIA, a-fib, pulmonary embolism, TIA, gout, rhabdomyolysis.   ? Limitations Standing;Walking;House hold activities;Lifting   ? How long can you sit comfortably? not limited   ? How long can you stand comfortably? pt reports limited due to feeling unsteady   ? How long can you walk comfortably? Pt reports he is currently walking household distances   ? Patient Stated Goals Pt would like to improve his walking and balance   ? Currently in Pain? No/denies   ? Pain Onset In the past 7 days   ? ?  ?  ? ?  ? ? ? ?INTERVENTIONS-  Goals reassessed for recertification. Please refer to goal section for details. ? ?FOTO- 62 (achieved) ? ?5xSTS- 13 sec  ? ?DGI-  21/24 (achieved) ? ?6MWT-  1056 ft no AD (achieved)  ? ?SLB- ?L: 4 sec ?R 8 sec (slight decrease) ? ?Seated p.ball rollouts to address back stiffness 10x forward/backward, diagonals 10x each direction ? ?At support surface- ?Tandem stance 2x each LE 45-60 sec ?SLB 2x each LE 30 sec intervals ? ?Seated- ?7# weights donned each LE LAQ -  20x each LE ? ? ?Pt educated throughout session about proper posture and technique with exercises. Improved exercise technique, movement at target joints, use of target muscles after min to mod verbal, visual, tactile cues. ? ? ? ? ? ? [facility] PT Assessment - 06/06/21 0001   ? ?  ? Dynamic Gait Index  ? Level Surface Normal   ? Change in Gait Speed Normal   ? Gait with Horizontal Head Turns Mild Impairment   ? Gait with Vertical Head Turns Normal   ? Gait and Pivot Turn Mild Impairment   ? Step Over Obstacle Normal   ? Step Around Obstacles Normal   ? Steps Mild Impairment   ? Total Score 21   ? ?  ?  ? ?  ? ? ? PT Education - 06/06/21 1200   ? ? Education Details exercise technique, body mechanics   ? Person(s) Educated Patient   ? Methods Explanation;Demonstration;Tactile  cues;Verbal cues   ? Comprehension Verbalized understanding;Returned demonstration;Verbal cues required;Need further instruction   ? ?  ?  ? ?  ? ? ? PT Short Term Goals - 06/06/21 1201   ? ?  ? PT SHORT TERM GOAL #1  ? Title Patient will be independent in home exercise program to improve balance, and strength/mobility for better functional independence with ADLs and decreased fall risk.   ? Baseline 03/14/21: to be initiated within next 1-2 sessions 2/6: HEP compliance   ? Time 6   ? Period Weeks   ? Status Achieved   ? Target Date 06/06/21   ?  ? PT SHORT TERM GOAL #2  ? Title Pt will improve gross BLE strength to 5/5 in order to increase ease and safety with mobility and ADLs.   ? Baseline 03/14/21: strength 4+/5 B, greatest deficit in B ankle DFs 4-/5 B 2/6: grossly 4+/5 with df 4/5 3/13: see note   ? Time 6   ? Period Weeks   ? Status Partially Met   ? Target Date 04/25/21   ? ?  ?  ? ?  ? ? ? ? PT Long Term Goals - 06/06/21 1202   ? ?  ? PT LONG TERM GOAL #1  ? Title Patient will increase FOTO score to greater than 59 to demonstrate statistically significant improvement in mobility and quality of life.   ? Baseline 03/14/21: 58 2/6: 57% 3/13: 57.4%; 3/30: 62   ? Time 12   ? Period Weeks   ? Status Achieved   ? Target Date 06/06/21   ?  ? PT LONG TERM GOAL #2  ? Title Patient (> 80 years old) will complete five times sit to stand test in < 10 seconds indicating increased LE strength and decreased fall risk   ? Baseline 03/14/21: 12 seconds 2/6: 12 seconds 3/13: 13.8 seconds without hands; 3/30: 13 sec   ? Time 12   ? Period Weeks   ? Status On-going   ? Target Date 08/29/21   ?  ? PT LONG TERM GOAL #3  ? Title Pt will improve DGI by at least 3 points in order to demonstrate clinically significant improvement in balance and decreased risk for falls.   ? Baseline 03/14/21: 15/24 2/6: 17/24 3/13: 19/24; 3/30: 21/24   ? Time 12   ? Period Weeks   ? Status Achieved   ? Target Date 06/06/21   ?  ? PT LONG TERM GOAL #4  ? Title  Pt will increase 6MWT  by at least 58m(1626f in order to demonstrate clinically significant improvement in cardiopulmonary endurance and community ambulation   ? Baseline 03/14/21: 795 ft with QC 2/6: 865 ft without AD 3/13: 770 ft with walking stick with knee pain; 3/30: 1056 ft   ? Time 12   ? Period Weeks   ? Status Achieved   ? Target Date 08/29/21   ?  ? PT LONG TERM GOAL #5  ? Title The patient will be able to maintain >5 seconds of SLB on BLEs in order to decrease risk of falls when navigating obstacles, curbs and steps.   ? Baseline 03/14/21: RLE 4 seconds, LLE 2 seconds 2/6: L 3 seconds R 4 seconds 3/13: L 4 seconds with pain R 15 seconds; 3/30: L 4 sec R 8 sec   ? Time 12   ? Period Weeks   ? Status Partially Met   ? Target Date 08/29/21   ?  ? PT LONG TERM GOAL #6  ? Title Patient will increase 10 meter walk test to at least 1.0 m/s with LRD as to improve gait speed for better community ambulation and to reduce fall risk.   ? Baseline 03/14/21: 0.67 m/s 2/6: 1.3 m/s   ? Time 12   ? Period Weeks   ? Status Achieved   ? Target Date 06/06/21   ? ?  ?  ? ?  ? ? ? ? ? ? ? ? Plan - 06/06/21 1204   ? ? Clinical Impression Statement Goals completed for recert. PT making gains AEB achieving FOTO, DGI and 6MWT goals. This indicates improved perceived functional mobility, QOL, balance and functional capacity/gait ability. While pt making gains he did see slight decrease with SLB time, and no change in 5xSTS score. Per tesitng pt is still at an increased risk for future falls. Patient's condition has the potential to improve in response to therapy. Maximum improvement is yet to be obtained. The anticipated improvement is attainable and reasonable in a generally predictable time.     The pt will benefit from further skilled PT to improve strength, endurance, balance and mobility to increase ease and safety with ADLs.   ? Personal Factors and Comorbidities Age;Past/Current Experience;Comorbidity 3+   ? Comorbidities A-fib,  lumbar disc disease, prostate CA, TIA, gout, hx of pulmonary embolism   ? Examination-Activity Limitations Carry;Lift;Stand;Locomotion Level;Bend;Dressing;Reach Overhead;Squat;Transfers;Caring for Others;

## 2021-06-10 ENCOUNTER — Ambulatory Visit: Payer: Medicare Other | Attending: Internal Medicine | Admitting: Physical Therapy

## 2021-06-10 DIAGNOSIS — R278 Other lack of coordination: Secondary | ICD-10-CM | POA: Insufficient documentation

## 2021-06-10 DIAGNOSIS — G8929 Other chronic pain: Secondary | ICD-10-CM | POA: Insufficient documentation

## 2021-06-10 DIAGNOSIS — R262 Difficulty in walking, not elsewhere classified: Secondary | ICD-10-CM | POA: Insufficient documentation

## 2021-06-10 DIAGNOSIS — R2681 Unsteadiness on feet: Secondary | ICD-10-CM | POA: Diagnosis not present

## 2021-06-10 DIAGNOSIS — R2689 Other abnormalities of gait and mobility: Secondary | ICD-10-CM | POA: Insufficient documentation

## 2021-06-10 DIAGNOSIS — M545 Low back pain, unspecified: Secondary | ICD-10-CM | POA: Diagnosis present

## 2021-06-10 DIAGNOSIS — M6281 Muscle weakness (generalized): Secondary | ICD-10-CM | POA: Diagnosis present

## 2021-06-10 NOTE — Therapy (Signed)
[facility] ?[place], [name], [address] ?Phone: [phone]   Fax:  [phone] ? ?Physical Therapy Treatment ? ?Patient Details  ?Name: Nicholas [name] ?MRN: [id number] ?Date of Birth: 12/23/1945 ?No data recorded ? ?Encounter Date: 06/10/2021 ? ? PT End of Session - 06/10/21 1201   ? ? Visit Number 26   ? Number of Visits 49   ? Date for PT Re-Evaluation 08/29/21   ? Authorization Type 4/10 PN 3/13   ? Authorization Time Period 03/14/2021-06/06/2021   ? Progress Note Due on Visit 20   ? PT Start Time 1101   ? PT Stop Time 1045   ? PT Time Calculation (min) 1424 min   ? Equipment Utilized During Treatment Gait belt   ? Activity Tolerance Patient tolerated treatment well   ? Behavior During Therapy [facility] for tasks assessed/performed   ? ?  ?  ? ?  ? ? ?Past Medical History:  ?Diagnosis Date  ? A-fib ([place])   ? Cancer ([place]) 10/25/2019  ? Prostate  ? 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 Dysrhythmia   ? Pulmonary embolism ([place])   ? Skin cancer   ? TIA (transient ischemic attack)   ? ? ?Past Surgical History:  ?Procedure Laterality Date  ? APPENDECTOMY    ? COLONOSCOPY WITH PROPOFOL N/A 11/23/2019  ? Procedure: COLONOSCOPY WITH PROPOFOL;  Surgeon: Toledo, Benay Pike, MD;  Location: [facility] ENDOSCOPY;  Service: Gastroenterology;  Laterality: N/A;  ? cyber knife surgery    ? PROSTATE SURGERY    ? ? ?There were no vitals filed for this visit. ? ? Subjective Assessment - 06/10/21 1157   ? ? Subjective Pt reports some back pain/stiffness at start of session that resolved with therapeutic exercises.   ? Pertinent History Pt is a 76 y/o male, returning to PT following admission to ED for evaluation of weakness s/p fall where pt was found to be COVID positive. Pt was in the hospital from 02/17/2021-02/23/2021. Pt was then d/c to home where he received [facility]. Pt reports increased LBP since d/c from hospital and decreased balance since last in outpatient PT. While pt  using QC currently, he reports he has been using a RW at home or grabbing onto the wall to steady himself while walking. Pt reports he has been walking home distances. PMH significant for TIA, a-fib, pulmonary embolism, TIA, gout, rhabdomyolysis.   ? Limitations Standing;Walking;House hold activities;Lifting   ? How long can you sit comfortably? not limited   ? How long can you stand comfortably? pt reports limited due to feeling unsteady   ? How long can you walk comfortably? Pt reports he is currently walking household distances   ? Patient Stated Goals Pt would like to improve his walking and balance   ? Pain Onset In the past 7 days   ? ?  ?  ? ?  ? ? ? ? ? ? ?  ?  ?INTERVENTIONS - ?  ?  ?TherEx:  ? ?STS and ambulation x 150 feet  ?First set x 7 reps  ?Second set x 8 reps ?Third set x 8 reps with airex pad under LEs.  ?- difficulty with balance and transitions with airex pad, 1 incidence pt required min A to prevent LOB when returning to seated position.  ?  ?CGA for stability ?  ?-Hip abduction with B upper extremity support, 2*10 each LE w/2.5#AW  ? ? ?-Hip flexion with B  upper extremity support, cueing for body mechanics,  2*10 each LE ?-cues for correct muscle recruitment  ? ?-Hamstring curl with B upper extremity support 2*10 reps each LE ?  ?Standing heel/toe on 1/2 foam roller  20 x B ? ?NMR: with gait belt donned and CGA-min a provided throughout ? ?Standing on airex semi-tandem 2*30 sec each position; intermittent UE support  ? ?Semitandem on airex with balance game on white board ( dual task word game) x several minutes with switch LE half way through  ? ?  ?Pt educated throughout session about proper posture and technique with exercises. Improved exercise technique, movement at target joints, use of target muscles after min to mod verbal, visual, tactile cues. ? ? ?Note: Portions of this document were prepared using Dragon voice recognition software and although reviewed may contain unintentional  dictation errors in syntax, grammar, or spelling. ? ? ? ? ? ? ? ? ? ? ? ? ? ? ? ? ? ? ? ? ? ? ? ? ? PT Short Term Goals - 06/06/21 1201   ? ?  ? PT SHORT TERM GOAL #1  ? Title Patient will be independent in home exercise program to improve balance, and strength/mobility for better functional independence with ADLs and decreased fall risk.   ? Baseline 03/14/21: to be initiated within next 1-2 sessions 2/6: HEP compliance   ? Time 6   ? Period Weeks   ? Status Achieved   ? Target Date 06/06/21   ?  ? PT SHORT TERM GOAL #2  ? Title Pt will improve gross BLE strength to 5/5 in order to increase ease and safety with mobility and ADLs.   ? Baseline 03/14/21: strength 4+/5 B, greatest deficit in B ankle DFs 4-/5 B 2/6: grossly 4+/5 with df 4/5 3/13: see note   ? Time 6   ? Period Weeks   ? Status Partially Met   ? Target Date 04/25/21   ? ?  ?  ? ?  ? ? ? ? PT Long Term Goals - 06/06/21 1202   ? ?  ? PT LONG TERM GOAL #1  ? Title Patient will increase FOTO score to greater than 59 to demonstrate statistically significant improvement in mobility and quality of life.   ? Baseline 03/14/21: 58 2/6: 57% 3/13: 57.4%; 3/30: 62   ? Time 12   ? Period Weeks   ? Status Achieved   ? Target Date 06/06/21   ?  ? PT LONG TERM GOAL #2  ? Title Patient (> 41 years old) will complete five times sit to stand test in < 10 seconds indicating increased LE strength and decreased fall risk   ? Baseline 03/14/21: 12 seconds 2/6: 12 seconds 3/13: 13.8 seconds without hands; 3/30: 13 sec   ? Time 12   ? Period Weeks   ? Status On-going   ? Target Date 08/29/21   ?  ? PT LONG TERM GOAL #3  ? Title Pt will improve DGI by at least 3 points in order to demonstrate clinically significant improvement in balance and decreased risk for falls.   ? Baseline 03/14/21: 15/24 2/6: 17/24 3/13: 19/24; 3/30: 21/24   ? Time 12   ? Period Weeks   ? Status Achieved   ? Target Date 06/06/21   ?  ? PT LONG TERM GOAL #4  ? Title Pt will increase 6MWT by at least 32m(1653f in  order to demonstrate clinically significant improvement in  cardiopulmonary endurance and community ambulation   ? Baseline 03/14/21: 795 ft with QC 2/6: 865 ft without AD 3/13: 770 ft with walking stick with knee pain; 3/30: 1056 ft   ? Time 12   ? Period Weeks   ? Status Achieved   ? Target Date 08/29/21   ?  ? PT LONG TERM GOAL #5  ? Title The patient will be able to maintain >5 seconds of SLB on BLEs in order to decrease risk of falls when navigating obstacles, curbs and steps.   ? Baseline 03/14/21: RLE 4 seconds, LLE 2 seconds 2/6: L 3 seconds R 4 seconds 3/13: L 4 seconds with pain R 15 seconds; 3/30: L 4 sec R 8 sec   ? Time 12   ? Period Weeks   ? Status Partially Met   ? Target Date 08/29/21   ?  ? PT LONG TERM GOAL #6  ? Title Patient will increase 10 meter walk test to at least 1.0 m/s with LRD as to improve gait speed for better community ambulation and to reduce fall risk.   ? Baseline 03/14/21: 0.67 m/s 2/6: 1.3 m/s   ? Time 12   ? Period Weeks   ? Status Achieved   ? Target Date 06/06/21   ? ?  ?  ? ?  ? ? ? ? ? ? ? ? Plan - 06/10/21 1201   ? ? Clinical Impression Statement Pt presents with excellent motivation for PT session.  Patient challenged with endurance training with sit to stand and walking interval training.  Patient has significant difficulty with sit to stands with Airex pad under feet without upper extremity support and had 1 loss of balance corrected with min assist from therapist to prevent loss of balance.  Patient also reports difficulty with balance activities with narrow base of support but does report significant improvement in completion of these activities since onset of physical therapy interventions.  Patient will continue to benefit from skilled physical therapy in order to improve his balance, mobility, reduce his risk of falls.   ? Personal Factors and Comorbidities Age;Past/Current Experience;Comorbidity 3+   ? Comorbidities A-fib, lumbar disc disease, prostate CA, TIA, gout, hx  of pulmonary embolism   ? Examination-Activity Limitations Carry;Lift;Stand;Locomotion Level;Bend;Dressing;Reach Overhead;Squat;Transfers;Caring for Others;Stairs   ? Examination-Participation Restrictions

## 2021-06-13 ENCOUNTER — Ambulatory Visit: Payer: Medicare Other

## 2021-06-13 DIAGNOSIS — R2681 Unsteadiness on feet: Secondary | ICD-10-CM

## 2021-06-13 DIAGNOSIS — M6281 Muscle weakness (generalized): Secondary | ICD-10-CM

## 2021-06-13 DIAGNOSIS — M545 Low back pain, unspecified: Secondary | ICD-10-CM

## 2021-06-13 DIAGNOSIS — R262 Difficulty in walking, not elsewhere classified: Secondary | ICD-10-CM

## 2021-06-13 NOTE — Therapy (Signed)
?[facility] ?[place], [name], [address] ?Phone: [phone]   Fax:  [phone] ? ?Physical Therapy Treatment ? ?Patient Details  ?Name: Nicholas [name] ?MRN: [id number] ?Date of Birth: 1945-10-28 ?No data recorded ? ?Encounter Date: 06/13/2021 ? ? PT End of Session - 06/13/21 1056   ? ? Visit Number 27   ? Number of Visits 49   ? Date for PT Re-Evaluation 08/29/21   ? Authorization Type 4/10 PN 3/13   ? Authorization Time Period 03/14/2021-06/06/2021   ? Progress Note Due on Visit 20   ? PT Start Time 1102   ? PT Stop Time 1144   ? PT Time Calculation (min) 42 min   ? Equipment Utilized During Treatment Gait belt   ? Activity Tolerance Patient tolerated treatment well   ? Behavior During Therapy [facility] for tasks assessed/performed   ? ?  ?  ? ?  ? ? ?Past Medical History:  ?Diagnosis Date  ? A-fib ([place])   ? Cancer ([place]) 10/25/2019  ? Prostate  ? 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 Dysrhythmia   ? Pulmonary embolism ([place])   ? Skin cancer   ? TIA (transient ischemic attack)   ? ? ?Past Surgical History:  ?Procedure Laterality Date  ? APPENDECTOMY    ? COLONOSCOPY WITH PROPOFOL N/A 11/23/2019  ? Procedure: COLONOSCOPY WITH PROPOFOL;  Surgeon: Toledo, Benay Pike, MD;  Location: [facility] ENDOSCOPY;  Service: Gastroenterology;  Laterality: N/A;  ? cyber knife surgery    ? PROSTATE SURGERY    ? ? ?There were no vitals filed for this visit. ? ? Subjective Assessment - 06/13/21 1058   ? ? Subjective Pt reports back pain has worsened in past 3 days. He states "it's hard to get up and move." He has been trying his HEP and states it helped some but pain is still present. Pt reports pain is like a "sore muscle" on the R side.   ? Pertinent History Pt is a 76 y/o male, returning to PT following admission to ED for evaluation of weakness s/p fall where pt was found to be COVID positive. Pt was in the hospital from 02/17/2021-02/23/2021. Pt was then d/c to home where he  received [facility]. Pt reports increased LBP since d/c from hospital and decreased balance since last in outpatient PT. While pt using QC currently, he reports he has been using a RW at home or grabbing onto the wall to steady himself while walking. Pt reports he has been walking home distances. PMH significant for TIA, a-fib, pulmonary embolism, TIA, gout, rhabdomyolysis.   ? Limitations Standing;Walking;House hold activities;Lifting   ? How long can you sit comfortably? not limited   ? How long can you stand comfortably? pt reports limited due to feeling unsteady   ? How long can you walk comfortably? Pt reports he is currently walking household distances   ? Patient Stated Goals Pt would like to improve his walking and balance   ? Currently in Pain? Yes   ? Pain Location Back   ? Pain Orientation Right   ? Pain Type Chronic pain;Acute pain   ? Pain Onset In the past 7 days   ? ?  ?  ? ?  ? ? ? ?INTERVENTIONS - ? ?  ?TherEx:  ?Seated hamstring stretch 60 sec each LE ? ?Hooklying on plinth- ?LTRs 20x each side  ?Green pball hamstring curls 20x ?Glute bridge 10x 2 ?Open book  x multiple reps B  ? ?STS 10x from increased height. Pt reports low back discomfort. ? ?Seated- ?Pball routlouts forward/backward, diagonal 10x for each in each direction ? ?Prone on plinth- ?Manual therapy ?Grade I-II CPAs to L2-3, T8-10 in 2-3 20-30 sec bouts per level as pt reports TTP in these regions following assessment of spine. ?STM and TrP release along R paraspinals and R low back musculature x 9 minutes. Additionally, PT instructed pt to perform 3 prone press-ups while PT provided manual therapy. Pt held pain-free end range press up for count of 5-10 sec. ? ?Pt educated throughout session about proper posture and technique with exercises. Improved exercise technique, movement at target joints, use of target muscles after min to mod verbal, visual, tactile cues. ?  ?  ?  ?  ? PT Education - 06/13/21 1057   ? ? Education Details exercise  technique, body mechanics   ? Person(s) Educated Patient   ? Methods Explanation;Demonstration;Tactile cues;Verbal cues   ? Comprehension Returned demonstration;Verbalized understanding;Need further instruction   ? ?  ?  ? ?  ? ? ? PT Short Term Goals - 06/06/21 1201   ? ?  ? PT SHORT TERM GOAL #1  ? Title Patient will be independent in home exercise program to improve balance, and strength/mobility for better functional independence with ADLs and decreased fall risk.   ? Baseline 03/14/21: to be initiated within next 1-2 sessions 2/6: HEP compliance   ? Time 6   ? Period Weeks   ? Status Achieved   ? Target Date 06/06/21   ?  ? PT SHORT TERM GOAL #2  ? Title Pt will improve gross BLE strength to 5/5 in order to increase ease and safety with mobility and ADLs.   ? Baseline 03/14/21: strength 4+/5 B, greatest deficit in B ankle DFs 4-/5 B 2/6: grossly 4+/5 with df 4/5 3/13: see note   ? Time 6   ? Period Weeks   ? Status Partially Met   ? Target Date 04/25/21   ? ?  ?  ? ?  ? ? ? ? PT Long Term Goals - 06/06/21 1202   ? ?  ? PT LONG TERM GOAL #1  ? Title Patient will increase FOTO score to greater than 59 to demonstrate statistically significant improvement in mobility and quality of life.   ? Baseline 03/14/21: 58 2/6: 57% 3/13: 57.4%; 3/30: 62   ? Time 12   ? Period Weeks   ? Status Achieved   ? Target Date 06/06/21   ?  ? PT LONG TERM GOAL #2  ? Title Patient (> 37 years old) will complete five times sit to stand test in < 10 seconds indicating increased LE strength and decreased fall risk   ? Baseline 03/14/21: 12 seconds 2/6: 12 seconds 3/13: 13.8 seconds without hands; 3/30: 13 sec   ? Time 12   ? Period Weeks   ? Status On-going   ? Target Date 08/29/21   ?  ? PT LONG TERM GOAL #3  ? Title Pt will improve DGI by at least 3 points in order to demonstrate clinically significant improvement in balance and decreased risk for falls.   ? Baseline 03/14/21: 15/24 2/6: 17/24 3/13: 19/24; 3/30: 21/24   ? Time 12   ? Period Weeks    ? Status Achieved   ? Target Date 06/06/21   ?  ? PT LONG TERM GOAL #4  ? Title Pt will increase  6MWT by at least 9m(1660f in order to demonstrate clinically significant improvement in cardiopulmonary endurance and community ambulation   ? Baseline 03/14/21: 795 ft with QC 2/6: 865 ft without AD 3/13: 770 ft with walking stick with knee pain; 3/30: 1056 ft   ? Time 12   ? Period Weeks   ? Status Achieved   ? Target Date 08/29/21   ?  ? PT LONG TERM GOAL #5  ? Title The patient will be able to maintain >5 seconds of SLB on BLEs in order to decrease risk of falls when navigating obstacles, curbs and steps.   ? Baseline 03/14/21: RLE 4 seconds, LLE 2 seconds 2/6: L 3 seconds R 4 seconds 3/13: L 4 seconds with pain R 15 seconds; 3/30: L 4 sec R 8 sec   ? Time 12   ? Period Weeks   ? Status Partially Met   ? Target Date 08/29/21   ?  ? PT LONG TERM GOAL #6  ? Title Patient will increase 10 meter walk test to at least 1.0 m/s with LRD as to improve gait speed for better community ambulation and to reduce fall risk.   ? Baseline 03/14/21: 0.67 m/s 2/6: 1.3 m/s   ? Time 12   ? Period Weeks   ? Status Achieved   ? Target Date 06/06/21   ? ?  ?  ? ?  ? ? ? ? ? ? ? ? Plan - 06/13/21 1927   ? ? Clinical Impression Statement Pt presents with increased back pain on this date affecting ease with ambulating and ability to perform transfers. Session focused on AROM strengthening and mobility interventions as well as manual therapy. Pt responded well to majority of interventions, reporting improvements in pain, but did report continued discomfort with STS. Pt noted in session to pt TTP along T8-10, L2-3. Pt reported most improvement felt with STM and TrP release to R lumbar and thoracic paraspinals. The pt will benefit from further skilled PT to improve strength, balance and mobility to reduce fall risk.   ? Personal Factors and Comorbidities Age;Past/Current Experience;Comorbidity 3+   ? Comorbidities A-fib, lumbar disc disease,  prostate CA, TIA, gout, hx of pulmonary embolism   ? Examination-Activity Limitations Carry;Lift;Stand;Locomotion Level;Bend;Dressing;Reach Overhead;Squat;Transfers;Caring for Others;Stairs   ? Examination

## 2021-06-17 ENCOUNTER — Ambulatory Visit: Payer: Medicare Other

## 2021-06-17 DIAGNOSIS — M6281 Muscle weakness (generalized): Secondary | ICD-10-CM

## 2021-06-17 DIAGNOSIS — R2681 Unsteadiness on feet: Secondary | ICD-10-CM | POA: Diagnosis not present

## 2021-06-17 DIAGNOSIS — M545 Low back pain, unspecified: Secondary | ICD-10-CM

## 2021-06-17 DIAGNOSIS — R262 Difficulty in walking, not elsewhere classified: Secondary | ICD-10-CM

## 2021-06-17 NOTE — Therapy (Signed)
[facility] ?[place], [name], [address] ?Phone: [phone]   Fax:  [phone] ? ?Physical Therapy Treatment ? ?Patient Details  ?Name: Nicholas [name] ?MRN: [id number] ?Date of Birth: Oct 05, 1945 ?No data recorded ? ?Encounter Date: 06/17/2021 ? ? PT End of Session - 06/17/21 1408   ? ? Visit Number 28   ? Number of Visits 49   ? Date for PT Re-Evaluation 08/29/21   ? Authorization Type 4/10 PN 3/13   ? Authorization Time Period 03/14/2021-06/06/2021   ? Progress Note Due on Visit 20   ? PT Start Time 1136   ? PT Stop Time 1227   ? PT Time Calculation (min) 51 min   ? Equipment Utilized During Treatment Gait belt   ? Activity Tolerance Patient tolerated treatment well   ? Behavior During Therapy [facility] for tasks assessed/performed   ? ?  ?  ? ?  ? ? ?Past Medical History:  ?Diagnosis Date  ? A-fib ([place])   ? Cancer ([place]) 10/25/2019  ? Prostate  ? 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 Dysrhythmia   ? Pulmonary embolism ([place])   ? Skin cancer   ? TIA (transient ischemic attack)   ? ? ?Past Surgical History:  ?Procedure Laterality Date  ? APPENDECTOMY    ? COLONOSCOPY WITH PROPOFOL N/A 11/23/2019  ? Procedure: COLONOSCOPY WITH PROPOFOL;  Surgeon: Toledo, Benay Pike, MD;  Location: [facility] ENDOSCOPY;  Service: Gastroenterology;  Laterality: N/A;  ? cyber knife surgery    ? PROSTATE SURGERY    ? ? ?There were no vitals filed for this visit. ? ? Subjective Assessment - 06/17/21 1137   ? ? Subjective Pt reports he almost fell yesterday when he stepped on his other foot. Pt reports he hurt his back when this happened. Pt rates current back pain as 3.5/10.   ? Pertinent History Pt is a 76 y/o male, returning to PT following admission to ED for evaluation of weakness s/p fall where pt was found to be COVID positive. Pt was in the hospital from 02/17/2021-02/23/2021. Pt was then d/c to home where he received [facility]. Pt reports increased LBP since d/c from hospital and  decreased balance since last in outpatient PT. While pt using QC currently, he reports he has been using a RW at home or grabbing onto the wall to steady himself while walking. Pt reports he has been walking home distances. PMH significant for TIA, a-fib, pulmonary embolism, TIA, gout, rhabdomyolysis.   ? Limitations Standing;Walking;House hold activities;Lifting   ? How long can you sit comfortably? not limited   ? How long can you stand comfortably? pt reports limited due to feeling unsteady   ? How long can you walk comfortably? Pt reports he is currently walking household distances   ? Patient Stated Goals Pt would like to improve his walking and balance   ? Currently in Pain? Yes   ? Pain Score 4    ? Pain Location Back   ? Pain Orientation Right;Lower   ? Pain Type Chronic pain;Acute pain   acute on chronic  ? Pain Onset In the past 7 days   ? ?  ?  ? ?  ? ? ? ?INTERVENTIONS - ? ?TherEx: ?  ?Hooklying on plinth- ?LTRs 2x20 B ?Open book x 12 reps B - movement more restricted sidelying and turning to R   ?Green pball hamstring curls 15x ?Glute bridge 15x  ? ?Standing spinal  flexion (repeated movement) 12x - pt reports symptoms improve with reps ? ?Treadmill LE and cardiorespiratory endurance training (HIIT) - Pt ambulates up to 2.3-2.6 mph in 20-30 sec bursts. Pt does request to decrease from 2.4-2.6 range as he rates this too challenging. HR peaks 120 bpm. Baseline ambulation is 1.6-1.8 mph. Pt completed on 1% incline.  X 6 min ? ?NMR: Gait belt donned, performed at support surface ? ? One foot on floor, one foot on airex in lunge position - 2x30-45 sec each LE ?--Pt progressed to performing intervention with dual cognitive task in each LE position. Pt reports intervention is challenging.  ? ?Standing on half-foam x several minutes with and without dual cognitive task. Noted increase in ankle righting. Pt rates medium.  ? ?Marching in place on airex with slow and controlled speed 2x20 each LE ? ?Marching in  place on airex with cone taps (2 cones placed in front of pt) 20x each LE. Pt knocks over cones but does improve with reps.  ? ?Tandem stance 2x30 sec each LE ?--progressed to dual cog task 2x30 sec each LE ? ?  ?Pt educated throughout session about proper posture and technique with exercises. Improved exercise technique, movement at target joints, use of target muscles after min to mod verbal, visual, tactile cues. ? ? ? PT Education - 06/17/21 1408   ? ? Education Details exercise technique   ? Person(s) Educated Patient   ? Methods Explanation;Demonstration;Tactile cues;Verbal cues   ? Comprehension Returned demonstration;Verbalized understanding;Need further instruction   ? ?  ?  ? ?  ? ? ? PT Short Term Goals - 06/06/21 1201   ? ?  ? PT SHORT TERM GOAL #1  ? Title Patient will be independent in home exercise program to improve balance, and strength/mobility for better functional independence with ADLs and decreased fall risk.   ? Baseline 03/14/21: to be initiated within next 1-2 sessions 2/6: HEP compliance   ? Time 6   ? Period Weeks   ? Status Achieved   ? Target Date 06/06/21   ?  ? PT SHORT TERM GOAL #2  ? Title Pt will improve gross BLE strength to 5/5 in order to increase ease and safety with mobility and ADLs.   ? Baseline 03/14/21: strength 4+/5 B, greatest deficit in B ankle DFs 4-/5 B 2/6: grossly 4+/5 with df 4/5 3/13: see note   ? Time 6   ? Period Weeks   ? Status Partially Met   ? Target Date 04/25/21   ? ?  ?  ? ?  ? ? ? ? PT Long Term Goals - 06/06/21 1202   ? ?  ? PT LONG TERM GOAL #1  ? Title Patient will increase FOTO score to greater than 59 to demonstrate statistically significant improvement in mobility and quality of life.   ? Baseline 03/14/21: 58 2/6: 57% 3/13: 57.4%; 3/30: 62   ? Time 12   ? Period Weeks   ? Status Achieved   ? Target Date 06/06/21   ?  ? PT LONG TERM GOAL #2  ? Title Patient (> 16 years old) will complete five times sit to stand test in < 10 seconds indicating increased LE  strength and decreased fall risk   ? Baseline 03/14/21: 12 seconds 2/6: 12 seconds 3/13: 13.8 seconds without hands; 3/30: 13 sec   ? Time 12   ? Period Weeks   ? Status On-going   ? Target Date 08/29/21   ?  ?  PT LONG TERM GOAL #3  ? Title Pt will improve DGI by at least 3 points in order to demonstrate clinically significant improvement in balance and decreased risk for falls.   ? Baseline 03/14/21: 15/24 2/6: 17/24 3/13: 19/24; 3/30: 21/24   ? Time 12   ? Period Weeks   ? Status Achieved   ? Target Date 06/06/21   ?  ? PT LONG TERM GOAL #4  ? Title Pt will increase 6MWT by at least 89m(1644f in order to demonstrate clinically significant improvement in cardiopulmonary endurance and community ambulation   ? Baseline 03/14/21: 795 ft with QC 2/6: 865 ft without AD 3/13: 770 ft with walking stick with knee pain; 3/30: 1056 ft   ? Time 12   ? Period Weeks   ? Status Achieved   ? Target Date 08/29/21   ?  ? PT LONG TERM GOAL #5  ? Title The patient will be able to maintain >5 seconds of SLB on BLEs in order to decrease risk of falls when navigating obstacles, curbs and steps.   ? Baseline 03/14/21: RLE 4 seconds, LLE 2 seconds 2/6: L 3 seconds R 4 seconds 3/13: L 4 seconds with pain R 15 seconds; 3/30: L 4 sec R 8 sec   ? Time 12   ? Period Weeks   ? Status Partially Met   ? Target Date 08/29/21   ?  ? PT LONG TERM GOAL #6  ? Title Patient will increase 10 meter walk test to at least 1.0 m/s with LRD as to improve gait speed for better community ambulation and to reduce fall risk.   ? Baseline 03/14/21: 0.67 m/s 2/6: 1.3 m/s   ? Time 12   ? Period Weeks   ? Status Achieved   ? Target Date 06/06/21   ? ?  ?  ? ?  ? ? ? ? ? ? ? ? Plan - 06/17/21 1409   ? ? Clinical Impression Statement Pt with excellent motivation to participate in session. He did begin appointment reporting increased back pain from over the weekend. By end of session pt reported improvement in symptoms, and responds well to flexion-based interventions. Pt able  to progress to slightly more challenging compliant surface intervention on this date (airex cone taps). Pt still very challenged with SLB tasks. The pt will benefit from further skilled PT to improve s

## 2021-06-20 ENCOUNTER — Ambulatory Visit: Payer: Medicare Other

## 2021-06-20 DIAGNOSIS — M545 Low back pain, unspecified: Secondary | ICD-10-CM

## 2021-06-20 DIAGNOSIS — G8929 Other chronic pain: Secondary | ICD-10-CM

## 2021-06-20 DIAGNOSIS — R2681 Unsteadiness on feet: Secondary | ICD-10-CM | POA: Diagnosis not present

## 2021-06-20 DIAGNOSIS — M6281 Muscle weakness (generalized): Secondary | ICD-10-CM

## 2021-06-20 DIAGNOSIS — R262 Difficulty in walking, not elsewhere classified: Secondary | ICD-10-CM

## 2021-06-20 NOTE — Therapy (Signed)
[facility] ?[facility] ?[place], [name], [address] ?Phone: [phone]   Fax:  [phone] ? ?Physical Therapy Treatment ? ?Patient Details  ?Name: Nicholas [name] ?MRN: [id number] ?Date of Birth: Mar 24, 1945 ?No data recorded ? ?Encounter Date: 06/20/2021 ? ? PT End of Session - 06/20/21 1408   ? ? Visit Number 29   ? Number of Visits 49   ? Date for PT Re-Evaluation 08/29/21   ? Authorization Type 4/10 PN 3/13   ? Authorization Time Period 03/14/2021-06/06/2021   ? Progress Note Due on Visit 20   ? PT Start Time 1103   ? PT Stop Time 1147   ? PT Time Calculation (min) 44 min   ? Equipment Utilized During Treatment Gait belt   ? Activity Tolerance Patient tolerated treatment well   ? Behavior During Therapy [facility] for tasks assessed/performed   ? ?  ?  ? ?  ? ? ?Past Medical History:  ?Diagnosis Date  ? A-fib ([place])   ? Cancer ([place]) 10/25/2019  ? Prostate  ? 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 Dysrhythmia   ? Pulmonary embolism ([place])   ? Skin cancer   ? TIA (transient ischemic attack)   ? ? ?Past Surgical History:  ?Procedure Laterality Date  ? APPENDECTOMY    ? COLONOSCOPY WITH PROPOFOL N/A 11/23/2019  ? Procedure: COLONOSCOPY WITH PROPOFOL;  Surgeon: Toledo, Benay Pike, MD;  Location: [facility] ENDOSCOPY;  Service: Gastroenterology;  Laterality: N/A;  ? cyber knife surgery    ? PROSTATE SURGERY    ? ? ?There were no vitals filed for this visit. ? ? Subjective Assessment - 06/20/21 1105   ? ? Subjective Pt reports continued low back pain. He reports pain "was pretty good" on Tuesday but that it has increased the past two days. Pt hsa no pain at rest. He reports it is with movement. Pt reports no stumbles/LOB recently.   ? Pertinent History Pt is a 76 y/o male, returning to PT following admission to ED for evaluation of weakness s/p fall where pt was found to be COVID positive. Pt was in the hospital from 02/17/2021-02/23/2021. Pt was then d/c to home where he  received [facility]. Pt reports increased LBP since d/c from hospital and decreased balance since last in outpatient PT. While pt using QC currently, he reports he has been using a RW at home or grabbing onto the wall to steady himself while walking. Pt reports he has been walking home distances. PMH significant for TIA, a-fib, pulmonary embolism, TIA, gout, rhabdomyolysis.   ? Limitations Standing;Walking;House hold activities;Lifting   ? How long can you sit comfortably? not limited   ? How long can you stand comfortably? pt reports limited due to feeling unsteady   ? How long can you walk comfortably? Pt reports he is currently walking household distances   ? Patient Stated Goals Pt would like to improve his walking and balance   ? Currently in Pain? No/denies   ? Pain Onset In the past 7 days   ? ?  ?  ? ?  ? ? ?INTERVENTIONS -  ? ?NMR: Gait belt donned, CGA provided throughout, interventions performed at support surface ? ?Forward/backward gait with vertical, horizontal, and diagonal head turns 4x length of bars for each condition. Pt uses intermittent UE support throughout due to decreased postural stability. ?Pt reports some dizziness with interventions, states, "like the room is moving." This resolved in <1 min. Pt  reports he has had episodes like this in the past.  ? ?Marches on airex pad for SLB 20x each LE. Pt uses intermittent UE support. Exhibits decreased eccentric control.  ? ?EC on airex NBOS 2x60 sec. Pt provides up to min a due to pt unsteadiness. ? ?On airex cone taps 2-3 cones 20x alternating LE. Pt knocks over cones 4x, uses intermittent UE support.  ? ?Basketball for dynamic balance, standing on airex: pt performs several minutes of the following ?NBOS, facing hoop ?NBOS, pt standing facing away from hoop so he must twist sideways (slightly) to perform intervention. Performed facing R and L sides.  ?Pt able to perform without UE support.  ? ? ?Therex: ? ?Seated p.ball rollouts - forward/backward,  diagonals x 2 minutes  ? ?Treadmill LE and cardiorespiratory endurance training (HIIT) - Pt ambulates up to 2.2-2.4 mph in 20-30 sec bursts, baseline speed 1.6-1.7. 3 minutes performed 1% elevation, 3 minutes 0% elevation due to fatigue. X 6 minutes total ? ?Hamstring stretch 2x30 sec each LE ? ?Seated piriformis stretch 2x30 sec each LE ? ?  ?  ?Pt educated throughout session about proper posture and technique with exercises. Improved exercise technique, movement at target joints, use of target muscles after min to mod verbal, visual, tactile cues. ? ? ? ? PT Education - 06/20/21 1407   ? ? Education Details exercise technique, body mechanics   ? Person(s) Educated Patient   ? Methods Explanation;Demonstration;Verbal cues   ? Comprehension Verbalized understanding;Returned demonstration;Verbal cues required;Need further instruction   ? ?  ?  ? ?  ? ? ? PT Short Term Goals - 06/06/21 1201   ? ?  ? PT SHORT TERM GOAL #1  ? Title Patient will be independent in home exercise program to improve balance, and strength/mobility for better functional independence with ADLs and decreased fall risk.   ? Baseline 03/14/21: to be initiated within next 1-2 sessions 2/6: HEP compliance   ? Time 6   ? Period Weeks   ? Status Achieved   ? Target Date 06/06/21   ?  ? PT SHORT TERM GOAL #2  ? Title Pt will improve gross BLE strength to 5/5 in order to increase ease and safety with mobility and ADLs.   ? Baseline 03/14/21: strength 4+/5 B, greatest deficit in B ankle DFs 4-/5 B 2/6: grossly 4+/5 with df 4/5 3/13: see note   ? Time 6   ? Period Weeks   ? Status Partially Met   ? Target Date 04/25/21   ? ?  ?  ? ?  ? ? ? ? PT Long Term Goals - 06/06/21 1202   ? ?  ? PT LONG TERM GOAL #1  ? Title Patient will increase FOTO score to greater than 59 to demonstrate statistically significant improvement in mobility and quality of life.   ? Baseline 03/14/21: 58 2/6: 57% 3/13: 57.4%; 3/30: 62   ? Time 12   ? Period Weeks   ? Status Achieved   ?  Target Date 06/06/21   ?  ? PT LONG TERM GOAL #2  ? Title Patient (> 42 years old) will complete five times sit to stand test in < 10 seconds indicating increased LE strength and decreased fall risk   ? Baseline 03/14/21: 12 seconds 2/6: 12 seconds 3/13: 13.8 seconds without hands; 3/30: 13 sec   ? Time 12   ? Period Weeks   ? Status On-going   ? Target Date 08/29/21   ?  ?  PT LONG TERM GOAL #3  ? Title Pt will improve DGI by at least 3 points in order to demonstrate clinically significant improvement in balance and decreased risk for falls.   ? Baseline 03/14/21: 15/24 2/6: 17/24 3/13: 19/24; 3/30: 21/24   ? Time 12   ? Period Weeks   ? Status Achieved   ? Target Date 06/06/21   ?  ? PT LONG TERM GOAL #4  ? Title Pt will increase 6MWT by at least 14m(166f in order to demonstrate clinically significant improvement in cardiopulmonary endurance and community ambulation   ? Baseline 03/14/21: 795 ft with QC 2/6: 865 ft without AD 3/13: 770 ft with walking stick with knee pain; 3/30: 1056 ft   ? Time 12   ? Period Weeks   ? Status Achieved   ? Target Date 08/29/21   ?  ? PT LONG TERM GOAL #5  ? Title The patient will be able to maintain >5 seconds of SLB on BLEs in order to decrease risk of falls when navigating obstacles, curbs and steps.   ? Baseline 03/14/21: RLE 4 seconds, LLE 2 seconds 2/6: L 3 seconds R 4 seconds 3/13: L 4 seconds with pain R 15 seconds; 3/30: L 4 sec R 8 sec   ? Time 12   ? Period Weeks   ? Status Partially Met   ? Target Date 08/29/21   ?  ? PT LONG TERM GOAL #6  ? Title Patient will increase 10 meter walk test to at least 1.0 m/s with LRD as to improve gait speed for better community ambulation and to reduce fall risk.   ? Baseline 03/14/21: 0.67 m/s 2/6: 1.3 m/s   ? Time 12   ? Period Weeks   ? Status Achieved   ? Target Date 06/06/21   ? ?  ?  ? ?  ? ? ? ? ? ? ? ? Plan - 06/20/21 1408   ? ? Clinical Impression Statement Pt progressed dynamic balance interventions to including forward/backward  ambulation with head turns. Following intervention pt reported the room looked as if "it were moving" and that he felt somewhat dizzy. This resolved in under 1 minute of rest. Pt reports he has had these symptoms befor

## 2021-06-24 ENCOUNTER — Ambulatory Visit: Payer: Medicare Other

## 2021-06-24 DIAGNOSIS — R2681 Unsteadiness on feet: Secondary | ICD-10-CM | POA: Diagnosis not present

## 2021-06-24 DIAGNOSIS — R278 Other lack of coordination: Secondary | ICD-10-CM

## 2021-06-24 DIAGNOSIS — R2689 Other abnormalities of gait and mobility: Secondary | ICD-10-CM

## 2021-06-24 DIAGNOSIS — G8929 Other chronic pain: Secondary | ICD-10-CM

## 2021-06-24 DIAGNOSIS — R262 Difficulty in walking, not elsewhere classified: Secondary | ICD-10-CM

## 2021-06-24 DIAGNOSIS — M545 Low back pain, unspecified: Secondary | ICD-10-CM

## 2021-06-24 DIAGNOSIS — M6281 Muscle weakness (generalized): Secondary | ICD-10-CM

## 2021-06-24 NOTE — Therapy (Signed)
[place] ?[place], [name], [address] ?Phone: [phone]   Fax:  [phone] ? ?Physical Therapy Treatment/Physical Therapy Progress Note ? ? ?Dates of reporting period  05/20/2021  to   06/24/2021 ? ?Patient Details  ?Name: Nicholas [name] ?MRN: [id number] ?Date of Birth: 04/15/1945 ?No data recorded ? ?Encounter Date: 06/24/2021 ? ? PT End of Session - 06/24/21 1058   ? ? Visit Number 30   ? Number of Visits 49   ? Date for PT Re-Evaluation 08/29/21   ? Authorization Type 4/10 PN 3/13   ? Authorization Time Period 03/14/2021-06/06/2021   ? Progress Note Due on Visit 20   ? PT Start Time 1059   ? PT Stop Time 1141   ? PT Time Calculation (min) 42 min   ? Equipment Utilized During Treatment Gait belt   ? Activity Tolerance Patient tolerated treatment well   ? Behavior During Therapy [facility] for tasks assessed/performed   ? ?  ?  ? ?  ? ? ?Past Medical History:  ?Diagnosis Date  ? A-fib ([place])   ? Cancer ([place]) 10/25/2019  ? Prostate  ? 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 Dysrhythmia   ? Pulmonary embolism ([place])   ? Skin cancer   ? TIA (transient ischemic attack)   ? ? ?Past Surgical History:  ?Procedure Laterality Date  ? APPENDECTOMY    ? COLONOSCOPY WITH PROPOFOL N/A 11/23/2019  ? Procedure: COLONOSCOPY WITH PROPOFOL;  Surgeon: Toledo, Benay Pike, MD;  Location: [facility] ENDOSCOPY;  Service: Gastroenterology;  Laterality: N/A;  ? cyber knife surgery    ? PROSTATE SURGERY    ? ? ?There were no vitals filed for this visit. ? ? Subjective Assessment - 06/24/21 1110   ? ? Subjective Pt reports his low back pain was worse over the weekend- up to 7/10 at worst - with walking. Pt denies any falls   ? Pertinent History Pt is a 76 y/o male, returning to PT following admission to ED for evaluation of weakness s/p fall where pt was found to be COVID positive. Pt was in the hospital from 02/17/2021-02/23/2021. Pt was then d/c to home where he received [facility]. Pt  reports increased LBP since d/c from hospital and decreased balance since last in outpatient PT. While pt using QC currently, he reports he has been using a RW at home or grabbing onto the wall to steady himself while walking. Pt reports he has been walking home distances. PMH significant for TIA, a-fib, pulmonary embolism, TIA, gout, rhabdomyolysis.   ? Limitations Standing;Walking;House hold activities;Lifting   ? How long can you sit comfortably? not limited   ? How long can you stand comfortably? pt reports limited due to feeling unsteady   ? How long can you walk comfortably? Pt reports he is currently walking household distances   ? Patient Stated Goals Pt would like to improve his walking and balance   ? Currently in Pain? Yes   ? Pain Score 7    ? Pain Location Back   ? Pain Orientation Left;Right;Posterior;Lower   ? Pain Descriptors / Indicators Aching   ? Pain Type Acute pain   ? Pain Onset 1 to 4 weeks ago   ? Pain Frequency Intermittent   ? Aggravating Factors  Walking and bending over   ? Pain Relieving Factors Rest   ? Effect of Pain on Daily Activities Difficulty With walking   ? ?  ?  ? ?  ? ? ?  INTERVENTIONS:  ? ?Reassessed goals for progress note ? ? ? ?5xSTS: 11.4 sec without UE support.  ? ?SLS time: 9.75 on right and 9.09 sec on Le ? ?Instruction in safe bed mobility to decrease twisting of back- log roll technique- patient able to follow VC and visual demo for correct technique today. He performed 2x to ensure understanding.  ? ? ?Manual therapy: supine while on moist heat ? ?B Hamstring stretching x 20 sec x 3 sets ?B Single knee to chest x 20 sec x 3 sets  ?B Lower trunk Rotation x 20 sec x 3 sets ? ? ? ? ? ? ? ? ? ? ? ? ? ? ? ? ? ? ? ? ? ? PT Education - 06/24/21 1426   ? ? Education Details Pain relieving techniques; purpose of functional outcome testing.   ? Person(s) Educated Patient   ? Methods Explanation;Demonstration;Tactile cues;Verbal cues   ? Comprehension Verbalized  understanding;Returned demonstration;Verbal cues required;Need further instruction;Tactile cues required   ? ?  ?  ? ?  ? ? ? PT Short Term Goals - 06/06/21 1201   ? ?  ? PT SHORT TERM GOAL #1  ? Title Patient will be independent in home exercise program to improve balance, and strength/mobility for better functional independence with ADLs and decreased fall risk.   ? Baseline 03/14/21: to be initiated within next 1-2 sessions 2/6: HEP compliance   ? Time 6   ? Period Weeks   ? Status Achieved   ? Target Date 06/06/21   ?  ? PT SHORT TERM GOAL #2  ? Title Pt will improve gross BLE strength to 5/5 in order to increase ease and safety with mobility and ADLs.   ? Baseline 03/14/21: strength 4+/5 B, greatest deficit in B ankle DFs 4-/5 B 2/6: grossly 4+/5 with df 4/5 3/13: see note   ? Time 6   ? Period Weeks   ? Status Partially Met   ? Target Date 04/25/21   ? ?  ?  ? ?  ? ? ? ? PT Long Term Goals - 06/24/21 1136   ? ?  ? PT LONG TERM GOAL #1  ? Title Patient will increase FOTO score to greater than 59 to demonstrate statistically significant improvement in mobility and quality of life.   ? Baseline 03/14/21: 58 2/6: 57% 3/13: 57.4%; 3/30: 62   ? Time 12   ? Period Weeks   ? Status Achieved   ? Target Date 06/06/21   ?  ? PT LONG TERM GOAL #2  ? Title Patient (> 52 years old) will complete five times sit to stand test in < 10 seconds indicating increased LE strength and decreased fall risk   ? Baseline 03/14/21: 12 seconds 2/6: 12 seconds 3/13: 13.8 seconds without hands; 3/30: 13 sec; 4/1   ? Time 12   ? Period Weeks   ? Status On-going   ? Target Date 08/29/21   ?  ? PT LONG TERM GOAL #3  ? Title Pt will improve DGI by at least 3 points in order to demonstrate clinically significant improvement in balance and decreased risk for falls.   ? Baseline 03/14/21: 15/24 2/6: 17/24 3/13: 19/24; 3/30: 21/24   ? Time 12   ? Period Weeks   ? Status Achieved   ? Target Date 06/06/21   ?  ? PT LONG TERM GOAL #4  ? Title Pt will increase  6MWT by at least 63m(  164f) in order to demonstrate clinically significant improvement in cardiopulmonary endurance and community ambulation   ? Baseline 03/14/21: 795 ft with QC 2/6: 865 ft without AD 3/13: 770 ft with walking stick with knee pain; 3/30: 1056 ft   ? Time 12   ? Period Weeks   ? Status Achieved   ? Target Date 08/29/21   ?  ? PT LONG TERM GOAL #5  ? Title The patient will be able to maintain >5 seconds of SLB on BLEs in order to decrease risk of falls when navigating obstacles, curbs and steps.   ? Baseline 03/14/21: RLE 4 seconds, LLE 2 seconds 2/6: L 3 seconds R 4 seconds 3/13: L 4 seconds with pain R 15 seconds; 3/30: L 4 sec R 8 sec   ? Time 12   ? Period Weeks   ? Status Partially Met   ? Target Date 08/29/21   ?  ? PT LONG TERM GOAL #6  ? Title Patient will increase 10 meter walk test to at least 1.0 m/s with LRD as to improve gait speed for better community ambulation and to reduce fall risk.   ? Baseline 03/14/21: 0.67 m/s 2/6: 1.3 m/s   ? Time 12   ? Period Weeks   ? Status Achieved   ? Target Date 06/06/21   ? ?  ?  ? ?  ? ? ? ? ? ? ? ? Plan - 06/24/21 1113   ? ? Clinical Impression Statement Patient presents with increased tightness today overall - hamstring flexibility <70 deg B and right LE tighter than left. Unable to reproduce low back symptoms during session today. Patient reports "Feeling much better after manual techniques." He did demo progress overall despite report of pain- including improved functional LE strength (as seen by 5xSTS) and improved standing balance (as seen by improved SLS time). Patient's condition has the potential to improve in response to therapy. Maximum improvement is yet to be obtained. The anticipated improvement is attainable and reasonable in a generally predictable time   ? Personal Factors and Comorbidities Age;Past/Current Experience;Comorbidity 3+   ? Comorbidities A-fib, lumbar disc disease, prostate CA, TIA, gout, hx of pulmonary embolism   ?  Examination-Activity Limitations Carry;Lift;Stand;Locomotion Level;Bend;Dressing;Reach Overhead;Squat;Transfers;Caring for Others;Stairs   ? Examination-Participation Restrictions Community Activity;Yard Work;Cleaning;Laundry;Shop;

## 2021-06-27 ENCOUNTER — Ambulatory Visit: Payer: Medicare Other

## 2021-06-27 DIAGNOSIS — G8929 Other chronic pain: Secondary | ICD-10-CM

## 2021-06-27 DIAGNOSIS — M6281 Muscle weakness (generalized): Secondary | ICD-10-CM

## 2021-06-27 DIAGNOSIS — R262 Difficulty in walking, not elsewhere classified: Secondary | ICD-10-CM

## 2021-06-27 DIAGNOSIS — R2681 Unsteadiness on feet: Secondary | ICD-10-CM

## 2021-06-27 NOTE — Therapy (Signed)
[place] [facility] ?[place], [name], [address] ?Phone: [phone]   Fax:  [phone] ? ?Physical Therapy Treatment ? ?Patient Details  ?Name: Nicholas [name] ?MRN: [id number] ?Date of Birth: 12-28-1945 ?No data recorded ? ?Encounter Date: 06/27/2021 ? ? PT End of Session - 06/27/21 1108   ? ? Visit Number 31   ? Number of Visits 49   ? Date for PT Re-Evaluation 08/29/21   ? Authorization Type 4/10 PN 3/13   ? Authorization Time Period 03/14/2021-06/06/2021   ? Progress Note Due on Visit 20   ? PT Start Time 1101   ? PT Stop Time 0938   ? PT Time Calculation (min) 42 min   ? Equipment Utilized During Treatment Gait belt   ? Activity Tolerance Patient tolerated treatment well   ? Behavior During Therapy [facility] for tasks assessed/performed   ? ?  ?  ? ?  ? ? ?Past Medical History:  ?Diagnosis Date  ? A-fib ([place])   ? Cancer ([place]) 10/25/2019  ? Prostate  ? 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 Dysrhythmia   ? Pulmonary embolism ([place])   ? Skin cancer   ? TIA (transient ischemic attack)   ? ? ?Past Surgical History:  ?Procedure Laterality Date  ? APPENDECTOMY    ? COLONOSCOPY WITH PROPOFOL N/A 11/23/2019  ? Procedure: COLONOSCOPY WITH PROPOFOL;  Surgeon: Toledo, Benay Pike, MD;  Location: [facility] ENDOSCOPY;  Service: Gastroenterology;  Laterality: N/A;  ? cyber knife surgery    ? PROSTATE SURGERY    ? ? ?There were no vitals filed for this visit. ? ? Subjective Assessment - 06/27/21 1106   ? ? Subjective Pt denies any falls, pt still having some back stiffness that he notices when ambulating.  Pt reporting 3/10 pain with the movement and notes he was prescribed prednisone, and believe it to be working since is pain has decreased.   ? Pertinent History Pt is a 76 y/o male, returning to PT following admission to ED for evaluation of weakness s/p fall where pt was found to be COVID positive. Pt was in the hospital from 02/17/2021-02/23/2021. Pt was then d/c to home where  he received [place] PT. Pt reports increased LBP since d/c from hospital and decreased balance since last in outpatient PT. While pt using QC currently, he reports he has been using a RW at home or grabbing onto the wall to steady himself while walking. Pt reports he has been walking home distances. PMH significant for TIA, a-fib, pulmonary embolism, TIA, gout, rhabdomyolysis.   ? Limitations Standing;Walking;House hold activities;Lifting   ? How long can you sit comfortably? not limited   ? How long can you stand comfortably? pt reports limited due to feeling unsteady   ? How long can you walk comfortably? Pt reports he is currently walking household distances   ? Patient Stated Goals Pt would like to improve his walking and balance   ? Pain Onset 1 to 4 weeks ago   ? ?  ?  ? ?  ? ? ? ? ?INTERVENTIONS -  ?  ?NMR:  ? ?Gait belt donned, CGA provided throughout, interventions performed at support surface ?Forward/backward gait with vertical, horizontal, and diagonal head turns 4x each length of bars for each condition. Pt uses intermittent UE support throughout due to decreased postural stability. ?Marches on airex pad for SLB 20x each LE. Pt uses intermittent UE support. Pt requires verbal cuing for slowed  approach to target muscles for eccentric control and to regain balance prior to attempting again ?Basketball toss on airex 3 rounds of 15 throws each, dynamic reaching for balls in basket, forward, lateral, with no hands in // bars with CGA. ?On airex cone taps 2-3 cones 20x alternating LE. Pt with only 3 knocked over cones during, uses intermittent UE support.  ?  ?Pt performed seated physioball roll-outs, x10 each side and forward ? ?Pt educated throughout session about proper posture and technique with exercises. Improved exercise technique, movement at target joints, use of target muscles after min to mod verbal, visual, tactile cues. ? ? ? ? ? ? ? PT Short Term Goals - 06/06/21 1201   ? ?  ? PT SHORT TERM GOAL #1   ? Title Patient will be independent in home exercise program to improve balance, and strength/mobility for better functional independence with ADLs and decreased fall risk.   ? Baseline 03/14/21: to be initiated within next 1-2 sessions 2/6: HEP compliance   ? Time 6   ? Period Weeks   ? Status Achieved   ? Target Date 06/06/21   ?  ? PT SHORT TERM GOAL #2  ? Title Pt will improve gross BLE strength to 5/5 in order to increase ease and safety with mobility and ADLs.   ? Baseline 03/14/21: strength 4+/5 B, greatest deficit in B ankle DFs 4-/5 B 2/6: grossly 4+/5 with df 4/5 3/13: see note   ? Time 6   ? Period Weeks   ? Status Partially Met   ? Target Date 04/25/21   ? ?  ?  ? ?  ? ? ? ? PT Long Term Goals - 06/24/21 1136   ? ?  ? PT LONG TERM GOAL #1  ? Title Patient will increase FOTO score to greater than 59 to demonstrate statistically significant improvement in mobility and quality of life.   ? Baseline 03/14/21: 58 2/6: 57% 3/13: 57.4%; 3/30: 62   ? Time 12   ? Period Weeks   ? Status Achieved   ? Target Date 06/06/21   ?  ? PT LONG TERM GOAL #2  ? Title Patient (> 67 years old) will complete five times sit to stand test in < 10 seconds indicating increased LE strength and decreased fall risk   ? Baseline 03/14/21: 12 seconds 2/6: 12 seconds 3/13: 13.8 seconds without hands; 3/30: 13 sec; 4/1   ? Time 12   ? Period Weeks   ? Status On-going   ? Target Date 08/29/21   ?  ? PT LONG TERM GOAL #3  ? Title Pt will improve DGI by at least 3 points in order to demonstrate clinically significant improvement in balance and decreased risk for falls.   ? Baseline 03/14/21: 15/24 2/6: 17/24 3/13: 19/24; 3/30: 21/24   ? Time 12   ? Period Weeks   ? Status Achieved   ? Target Date 06/06/21   ?  ? PT LONG TERM GOAL #4  ? Title Pt will increase 6MWT by at least 53m(1681f in order to demonstrate clinically significant improvement in cardiopulmonary endurance and community ambulation   ? Baseline 03/14/21: 795 ft with QC 2/6: 865 ft  without AD 3/13: 770 ft with walking stick with knee pain; 3/30: 1056 ft   ? Time 12   ? Period Weeks   ? Status Achieved   ? Target Date 08/29/21   ?  ? PT LONG TERM GOAL #  5  ? Title The patient will be able to maintain >5 seconds of SLB on BLEs in order to decrease risk of falls when navigating obstacles, curbs and steps.   ? Baseline 03/14/21: RLE 4 seconds, LLE 2 seconds 2/6: L 3 seconds R 4 seconds 3/13: L 4 seconds with pain R 15 seconds; 3/30: L 4 sec R 8 sec   ? Time 12   ? Period Weeks   ? Status Partially Met   ? Target Date 08/29/21   ?  ? PT LONG TERM GOAL #6  ? Title Patient will increase 10 meter walk test to at least 1.0 m/s with LRD as to improve gait speed for better community ambulation and to reduce fall risk.   ? Baseline 03/14/21: 0.67 m/s 2/6: 1.3 m/s   ? Time 12   ? Period Weeks   ? Status Achieved   ? Target Date 06/06/21   ? ?  ?  ? ?  ? ? ? ? ? ? ? ? Plan - 06/27/21 1108   ? ? Personal Factors and Comorbidities Age;Past/Current Experience;Comorbidity 3+   ? Comorbidities A-fib, lumbar disc disease, prostate CA, TIA, gout, hx of pulmonary embolism   ? Examination-Activity Limitations Carry;Lift;Stand;Locomotion Level;Bend;Dressing;Reach Overhead;Squat;Transfers;Caring for Others;Stairs   ? Examination-Participation Restrictions Community Activity;Yard Work;Cleaning;Laundry;Shop;Church   ? Stability/Clinical Decision Making Evolving/Moderate complexity   ? Rehab Potential Good   ? PT Frequency 2x / week   ? PT Duration 12 weeks   ? PT Treatment/Interventions ADLs/Self Care Home Management;Biofeedback;Aquatic Therapy;Canalith Repostioning;Cryotherapy;Electrical Stimulation;Iontophoresis 6m/ml Dexamethasone;Moist Heat;Traction;Ultrasound;Gait training;Stair training;DME Instruction;Functional mobility training;Therapeutic activities;Therapeutic exercise;Balance training;Neuromuscular re-education;Patient/family education;Manual techniques;Passive range of motion;Dry needling;Energy  conservation;Vestibular;Joint Manipulations;Orthotic Fit/Training;Splinting;Taping;Visual/perceptual remediation/compensation   ? PT Next Visit Plan Progress balance, gait, cardioresp. endurance, strengthening, continue

## 2021-07-01 ENCOUNTER — Ambulatory Visit: Payer: Medicare Other

## 2021-07-01 DIAGNOSIS — M545 Low back pain, unspecified: Secondary | ICD-10-CM

## 2021-07-01 DIAGNOSIS — R2681 Unsteadiness on feet: Secondary | ICD-10-CM | POA: Diagnosis not present

## 2021-07-01 DIAGNOSIS — R262 Difficulty in walking, not elsewhere classified: Secondary | ICD-10-CM

## 2021-07-01 DIAGNOSIS — M6281 Muscle weakness (generalized): Secondary | ICD-10-CM

## 2021-07-01 NOTE — Therapy (Signed)
[facility] ?[place], [name], [address] ?Phone: [phone]   Fax:  [phone] ? ?Physical Therapy Treatment ? ?Patient Details  ?Name: Nicholas [name] ?MRN: [id number] ?Date of Birth: [birth year] ?No data recorded ? ?Encounter Date: 07/01/2021 ? ? PT End of Session - 07/01/21 1138   ? ? Visit Number 32   ? Number of Visits 49   ? Date for PT Re-Evaluation 08/29/21   ? Authorization Type 4/10 PN 3/13   ? Authorization Time Period 03/14/2021-06/06/2021   ? Progress Note Due on Visit 20   ? PT Start Time 1140   ? PT Stop Time 1226   ? PT Time Calculation (min) 46 min   ? Equipment Utilized During Treatment Gait belt   ? Activity Tolerance Patient tolerated treatment well   ? Behavior During Therapy [facility] for tasks assessed/performed   ? ?  ?  ? ?  ? ? ?Past Medical History:  ?Diagnosis Date  ? A-fib ([place])   ? Cancer ([place]) 10/25/2019  ? Prostate  ? 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 Dysrhythmia   ? Pulmonary embolism ([place])   ? Skin cancer   ? TIA (transient ischemic attack)   ? ? ?Past Surgical History:  ?Procedure Laterality Date  ? APPENDECTOMY    ? COLONOSCOPY WITH PROPOFOL N/A 11/23/2019  ? Procedure: COLONOSCOPY WITH PROPOFOL;  Surgeon: Toledo, Benay Pike, MD;  Location: [facility] ENDOSCOPY;  Service: Gastroenterology;  Laterality: N/A;  ? cyber knife surgery    ? PROSTATE SURGERY    ? ? ?There were no vitals filed for this visit. ? ? Subjective Assessment - 07/01/21 1139   ? ? Subjective Pt reports increased back pain and stiffness. He stacked chairs this weekend.   ? Pertinent History Pt is a 76 y/o male, returning to PT following admission to ED for evaluation of weakness s/p fall where pt was found to be COVID positive. Pt was in the hospital from 02/17/2021-02/23/2021. Pt was then d/c to home where he received [facility]. Pt reports increased LBP since d/c from hospital and decreased balance since last in outpatient PT. While pt using QC currently, he  reports he has been using a RW at home or grabbing onto the wall to steady himself while walking. Pt reports he has been walking home distances. PMH significant for TIA, a-fib, pulmonary embolism, TIA, gout, rhabdomyolysis.   ? Limitations Standing;Walking;House hold activities;Lifting   ? How long can you sit comfortably? not limited   ? How long can you stand comfortably? pt reports limited due to feeling unsteady   ? How long can you walk comfortably? Pt reports he is currently walking household distances   ? Patient Stated Goals Pt would like to improve his walking and balance   ? Currently in Pain? Yes   ? Pain Location Back   ? Pain Orientation Right;Lower   ? Pain Onset More than a month ago   ? ?  ?  ? ?  ? ?INTERVENTIONS -  ?  ?Therex - heat donned to low back while pt performs the following- ? ?On mat table: ?LTRs - 10x assisted, 10x bilat without assist - reports improvement in symptoms  ? ?Assisted hamstring stretch 2x30-40 sec B ? ?Figure 4 stretch 2x30 sec B  ? ?Glute bridge 15x with 3 sec holds  ? ?Hot pack removed. No adverse reaction to treatment observed or reported.  ? ?Seated diagonal p.ball rollouts forward/backward 5x,  diagonal 10x   ? ?Seated rows at matrix cable machine: ?7.5# 10x  ?17.5# 10x ?22.5# 20x ? ?Seated thoracic extension over chair 15x  ? ?Seated thoracic rotation - 15x bilat. limited secondary to low back pain felt B  ?  ?Treadmill LE and cardioresp. endurance training: pt base speed 1.6-1.7 mph. Pt performs HIIT with 30 sec bursts in range 2.0-2.4 mph. Pt performs at 1% elevation for 6.5 minutes.   ? ?Seated p.ball rollouts forward/back 10x ? ?Pt reports no back pain at rest at end of session. Pt reports overall improvement in back pain with movement at end of session.   ? ?  ?Pt educated throughout session about proper posture and technique with exercises. Improved exercise technique, movement at target joints, use of target muscles after min to mod verbal, visual, tactile  cues. ?  ? ? ? PT Education - 07/01/21 1620   ? ? Education Details exercise technique   ? Person(s) Educated Patient   ? Methods Explanation;Demonstration;Tactile cues;Verbal cues   ? Comprehension Verbalized understanding;Returned demonstration;Need further instruction   ? ?  ?  ? ?  ? ? ? PT Short Term Goals - 06/06/21 1201   ? ?  ? PT SHORT TERM GOAL #1  ? Title Patient will be independent in home exercise program to improve balance, and strength/mobility for better functional independence with ADLs and decreased fall risk.   ? Baseline 03/14/21: to be initiated within next 1-2 sessions 2/6: HEP compliance   ? Time 6   ? Period Weeks   ? Status Achieved   ? Target Date 06/06/21   ?  ? PT SHORT TERM GOAL #2  ? Title Pt will improve gross BLE strength to 5/5 in order to increase ease and safety with mobility and ADLs.   ? Baseline 03/14/21: strength 4+/5 B, greatest deficit in B ankle DFs 4-/5 B 2/6: grossly 4+/5 with df 4/5 3/13: see note   ? Time 6   ? Period Weeks   ? Status Partially Met   ? Target Date 04/25/21   ? ?  ?  ? ?  ? ? ? ? PT Long Term Goals - 06/24/21 1136   ? ?  ? PT LONG TERM GOAL #1  ? Title Patient will increase FOTO score to greater than 59 to demonstrate statistically significant improvement in mobility and quality of life.   ? Baseline 03/14/21: 58 2/6: 57% 3/13: 57.4%; 3/30: 62   ? Time 12   ? Period Weeks   ? Status Achieved   ? Target Date 06/06/21   ?  ? PT LONG TERM GOAL #2  ? Title Patient (> 75 years old) will complete five times sit to stand test in < 10 seconds indicating increased LE strength and decreased fall risk   ? Baseline 03/14/21: 12 seconds 2/6: 12 seconds 3/13: 13.8 seconds without hands; 3/30: 13 sec; 4/1   ? Time 12   ? Period Weeks   ? Status On-going   ? Target Date 08/29/21   ?  ? PT LONG TERM GOAL #3  ? Title Pt will improve DGI by at least 3 points in order to demonstrate clinically significant improvement in balance and decreased risk for falls.   ? Baseline 03/14/21:  15/24 2/6: 17/24 3/13: 19/24; 3/30: 21/24   ? Time 12   ? Period Weeks   ? Status Achieved   ? Target Date 06/06/21   ?  ? PT LONG TERM GOAL #  4  ? Title Pt will increase 6MWT by at least 24m(1613f in order to demonstrate clinically significant improvement in cardiopulmonary endurance and community ambulation   ? Baseline 03/14/21: 795 ft with QC 2/6: 865 ft without AD 3/13: 770 ft with walking stick with knee pain; 3/30: 1056 ft   ? Time 12   ? Period Weeks   ? Status Achieved   ? Target Date 08/29/21   ?  ? PT LONG TERM GOAL #5  ? Title The patient will be able to maintain >5 seconds of SLB on BLEs in order to decrease risk of falls when navigating obstacles, curbs and steps.   ? Baseline 03/14/21: RLE 4 seconds, LLE 2 seconds 2/6: L 3 seconds R 4 seconds 3/13: L 4 seconds with pain R 15 seconds; 3/30: L 4 sec R 8 sec   ? Time 12   ? Period Weeks   ? Status Partially Met   ? Target Date 08/29/21   ?  ? PT LONG TERM GOAL #6  ? Title Patient will increase 10 meter walk test to at least 1.0 m/s with LRD as to improve gait speed for better community ambulation and to reduce fall risk.   ? Baseline 03/14/21: 0.67 m/s 2/6: 1.3 m/s   ? Time 12   ? Period Weeks   ? Status Achieved   ? Target Date 06/06/21   ? ?  ?  ? ?  ? ? ? ? ? ? ? ? Plan - 07/01/21 1621   ? ? Clinical Impression Statement Pt presented to PT with increased LBP and with noted flexed posture and decreased postural stability with gait (furniture walking noted). Session focused on pain modulation techniques, spinal mobility and strengthening of postural muscles. By end of session pt noted to ambulate with improved upright posture and stability, with reports of improvement in back pain at end of session.   ? Personal Factors and Comorbidities Age;Past/Current Experience;Comorbidity 3+   ? Comorbidities A-fib, lumbar disc disease, prostate CA, TIA, gout, hx of pulmonary embolism   ? Examination-Activity Limitations Carry;Lift;Stand;Locomotion  Level;Bend;Dressing;Reach Overhead;Squat;Transfers;Caring for Others;Stairs   ? Examination-Participation Restrictions Community Activity;Yard Work;Cleaning;Laundry;Shop;Church   ? Stability/Clinical Decision Making Evolving/Moder

## 2021-07-04 ENCOUNTER — Ambulatory Visit: Payer: Medicare Other

## 2021-07-04 DIAGNOSIS — R2681 Unsteadiness on feet: Secondary | ICD-10-CM | POA: Diagnosis not present

## 2021-07-04 DIAGNOSIS — G8929 Other chronic pain: Secondary | ICD-10-CM

## 2021-07-04 DIAGNOSIS — M6281 Muscle weakness (generalized): Secondary | ICD-10-CM

## 2021-07-04 NOTE — Therapy (Signed)
[facility] ?[place], [name], [address] ?Phone: [phone]   Fax:  [phone] ? ?Physical Therapy Treatment ? ?Patient Details  ?Name: Nicholas [name] ?MRN: [id number] ?Date of Birth: 1946/02/28 ?No data recorded ? ?Encounter Date: 07/04/2021 ? ? PT End of Session - 07/04/21 1157   ? ? Visit Number 33   ? Number of Visits 49   ? Date for PT Re-Evaluation 08/29/21   ? Authorization Type 4/10 PN 3/13   ? Authorization Time Period 03/14/2021-06/06/2021   ? Progress Note Due on Visit 20   ? PT Start Time 1102   ? PT Stop Time 1144   ? PT Time Calculation (min) 42 min   ? Equipment Utilized During Treatment Gait belt   ? Activity Tolerance Patient tolerated treatment well;No increased pain   ? Behavior During Therapy [facility] for tasks assessed/performed   ? ?  ?  ? ?  ? ? ?Past Medical History:  ?Diagnosis Date  ? A-fib ([place])   ? Cancer ([place]) 10/25/2019  ? Prostate  ? 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 Dysrhythmia   ? Pulmonary embolism ([place])   ? Skin cancer   ? TIA (transient ischemic attack)   ? ? ?Past Surgical History:  ?Procedure Laterality Date  ? APPENDECTOMY    ? COLONOSCOPY WITH PROPOFOL N/A 11/23/2019  ? Procedure: COLONOSCOPY WITH PROPOFOL;  Surgeon: Toledo, Benay Pike, MD;  Location: [facility] ENDOSCOPY;  Service: Gastroenterology;  Laterality: N/A;  ? cyber knife surgery    ? PROSTATE SURGERY    ? ? ?There were no vitals filed for this visit. ? ? ? Subjective Assessment - 07/04/21 1106   ? ? Subjective Pt reports improvement in his pain following last session, but that today his back pain is increased again/stiff.   ? Pertinent History Pt is a 76 y/o male, returning to PT following admission to ED for evaluation of weakness s/p fall where pt was found to be COVID positive. Pt was in the hospital from 02/17/2021-02/23/2021. Pt was then d/c to home where he received [facility]. Pt reports increased LBP since d/c from hospital and decreased balance since  last in outpatient PT. While pt using QC currently, he reports he has been using a RW at home or grabbing onto the wall to steady himself while walking. Pt reports he has been walking home distances. PMH significant for TIA, a-fib, pulmonary embolism, TIA, gout, rhabdomyolysis.   ? Limitations Standing;Walking;House hold activities;Lifting   ? How long can you sit comfortably? not limited   ? How long can you stand comfortably? pt reports limited due to feeling unsteady   ? How long can you walk comfortably? Pt reports he is currently walking household distances   ? Patient Stated Goals Pt would like to improve his walking and balance   ? Currently in Pain? Yes   ? Pain Location Back   ? Pain Orientation Lower   ? Pain Onset More than a month ago   ? ?  ?  ? ?  ? ? ? ?INTERVENTIONS -  ?  ?Therex - heat donned to low back while pt performs the following on mat table: ?  ?On mat table: ?LTRs - 20x B ?Green pball hamstring curls 20x ?Glute bridge 20x  ?SLR 11x each LE ?Deadbug 2x10 alt LE ?LTRs 10x ? ?Hot pack removed. Pt reports improvement in symptoms with heat. No adverse reaction to treatment.  ? ?Heel raises  2x20 BLEs ? ?NMR: ?Rocker board fwd/backwd/side to side x2 min, x 1 min ? ?Red mat walking, close CGA, decrease noted with postural stability, step-strategy utilized, but no LOB. ? ?SLB 90 sec each LE; intermittent UE support ? ?Tandem stance 2x60 sec each LE  ? ?// bars: ?Dynamic gait task with vertical and horizontal head turns fwd/bckwd 10x for each ? ?One foot on floor one on 6 inch step 2x30 seconds each LE; intermittent UE support ? ?Instructed pt in HEP: ? ?Access Code: [email or web address] ?URL: [email or web address] ?Date: 07/04/2021 ?Prepared by: Ricard Dillon ? ?Exercises ?- Dead Bug  - 1 x daily - 5 x weekly - 2 sets - 10 reps ? ? Pt educated throughout session about proper posture and technique with exercises. Improved exercise technique, movement at target joints, use of target muscles after min  to mod verbal, visual, tactile cues. ? ? ? ? PT Education - 07/04/21 1157   ? ? Education Details exercise technique, body mechanics   ? Person(s) Educated Patient   ? Methods Explanation;Demonstration;Verbal cues;Handout   ? Comprehension Verbalized understanding;Returned demonstration;Verbal cues required;Need further instruction   ? ?  ?  ? ?  ? ? ? PT Short Term Goals - 06/06/21 1201   ? ?  ? PT SHORT TERM GOAL #1  ? Title Patient will be independent in home exercise program to improve balance, and strength/mobility for better functional independence with ADLs and decreased fall risk.   ? Baseline 03/14/21: to be initiated within next 1-2 sessions 2/6: HEP compliance   ? Time 6   ? Period Weeks   ? Status Achieved   ? Target Date 06/06/21   ?  ? PT SHORT TERM GOAL #2  ? Title Pt will improve gross BLE strength to 5/5 in order to increase ease and safety with mobility and ADLs.   ? Baseline 03/14/21: strength 4+/5 B, greatest deficit in B ankle DFs 4-/5 B 2/6: grossly 4+/5 with df 4/5 3/13: see note   ? Time 6   ? Period Weeks   ? Status Partially Met   ? Target Date 04/25/21   ? ?  ?  ? ?  ? ? ? ? PT Long Term Goals - 06/24/21 1136   ? ?  ? PT LONG TERM GOAL #1  ? Title Patient will increase FOTO score to greater than 59 to demonstrate statistically significant improvement in mobility and quality of life.   ? Baseline 03/14/21: 58 2/6: 57% 3/13: 57.4%; 3/30: 62   ? Time 12   ? Period Weeks   ? Status Achieved   ? Target Date 06/06/21   ?  ? PT LONG TERM GOAL #2  ? Title Patient (> 46 years old) will complete five times sit to stand test in < 10 seconds indicating increased LE strength and decreased fall risk   ? Baseline 03/14/21: 12 seconds 2/6: 12 seconds 3/13: 13.8 seconds without hands; 3/30: 13 sec; 4/1   ? Time 12   ? Period Weeks   ? Status On-going   ? Target Date 08/29/21   ?  ? PT LONG TERM GOAL #3  ? Title Pt will improve DGI by at least 3 points in order to demonstrate clinically significant improvement in  balance and decreased risk for falls.   ? Baseline 03/14/21: 15/24 2/6: 17/24 3/13: 19/24; 3/30: 21/24   ? Time 12   ? Period Weeks   ? Status Achieved   ? Target Date 06/06/21   ?  ?  PT LONG TERM GOAL #4  ? Title Pt will increase 6MWT by at least 44m(1669f in order to demonstrate clinically significant improvement in cardiopulmonary endurance and community ambulation   ? Baseline 03/14/21: 795 ft with QC 2/6: 865 ft without AD 3/13: 770 ft with walking stick with knee pain; 3/30: 1056 ft   ? Time 12   ? Period Weeks   ? Status Achieved   ? Target Date 08/29/21   ?  ? PT LONG TERM GOAL #5  ? Title The patient will be able to maintain >5 seconds of SLB on BLEs in order to decrease risk of falls when navigating obstacles, curbs and steps.   ? Baseline 03/14/21: RLE 4 seconds, LLE 2 seconds 2/6: L 3 seconds R 4 seconds 3/13: L 4 seconds with pain R 15 seconds; 3/30: L 4 sec R 8 sec   ? Time 12   ? Period Weeks   ? Status Partially Met   ? Target Date 08/29/21   ?  ? PT LONG TERM GOAL #6  ? Title Patient will increase 10 meter walk test to at least 1.0 m/s with LRD as to improve gait speed for better community ambulation and to reduce fall risk.   ? Baseline 03/14/21: 0.67 m/s 2/6: 1.3 m/s   ? Time 12   ? Period Weeks   ? Status Achieved   ? Target Date 06/06/21   ? ?  ?  ? ?  ? ? ? ? ? ? ? ? Plan - 07/04/21 1153   ? ? Clinical Impression Statement Split focus on interventions to address LBP and balance this session. PT responds well to supine therex reporting feeling his back "loosen up" and improve symptoms. Pt challenged with deadbugs. PT added this exercise to HEP. Pt able to sustain 30 sec tandem stance and 5-10 sec SLB each LE today. The pt will benefit from further skilled PT to improve strength, balance and mobility.   ? Personal Factors and Comorbidities Age;Past/Current Experience;Comorbidity 3+   ? Comorbidities A-fib, lumbar disc disease, prostate CA, TIA, gout, hx of pulmonary embolism   ? Examination-Activity  Limitations Carry;Lift;Stand;Locomotion Level;Bend;Dressing;Reach Overhead;Squat;Transfers;Caring for Others;Stairs   ? Examination-Participation Restrictions Community Activity;Yard Work;Cleaning;Laund

## 2021-07-08 ENCOUNTER — Ambulatory Visit: Payer: Medicare Other | Attending: Internal Medicine

## 2021-07-08 DIAGNOSIS — M6281 Muscle weakness (generalized): Secondary | ICD-10-CM | POA: Diagnosis present

## 2021-07-08 DIAGNOSIS — M545 Low back pain, unspecified: Secondary | ICD-10-CM | POA: Diagnosis present

## 2021-07-08 DIAGNOSIS — G8929 Other chronic pain: Secondary | ICD-10-CM | POA: Diagnosis present

## 2021-07-08 DIAGNOSIS — R2681 Unsteadiness on feet: Secondary | ICD-10-CM | POA: Diagnosis present

## 2021-07-08 DIAGNOSIS — R262 Difficulty in walking, not elsewhere classified: Secondary | ICD-10-CM | POA: Diagnosis present

## 2021-07-08 NOTE — Therapy (Signed)
?[facility] ?[place], [name], [address] ?Phone: [phone]   Fax:  [phone] ? ?Physical Therapy Treatment ? ?Patient Details  ?Name: Nicholas [name] ?MRN: [id number] ?Date of Birth: Feb 25, 1946 ?No data recorded ? ?Encounter Date: 07/08/2021 ? ? PT End of Session - 07/08/21 1113   ? ? Visit Number 34   ? Number of Visits 49   ? Date for PT Re-Evaluation 08/29/21   ? Authorization Type 4/10 PN 3/13   ? Authorization Time Period 03/14/2021-06/06/2021   ? Progress Note Due on Visit 20   ? PT Start Time 1100   ? PT Stop Time 1144   ? PT Time Calculation (min) 44 min   ? Equipment Utilized During Treatment Gait belt   ? Activity Tolerance Patient tolerated treatment well;No increased pain   ? Behavior During Therapy [facility] for tasks assessed/performed   ? ?  ?  ? ?  ? ? ?Past Medical History:  ?Diagnosis Date  ? A-fib ([place])   ? Cancer ([place]) 10/25/2019  ? Prostate  ? 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 Dysrhythmia   ? Pulmonary embolism ([place])   ? Skin cancer   ? TIA (transient ischemic attack)   ? ? ?Past Surgical History:  ?Procedure Laterality Date  ? APPENDECTOMY    ? COLONOSCOPY WITH PROPOFOL N/A 11/23/2019  ? Procedure: COLONOSCOPY WITH PROPOFOL;  Surgeon: Toledo, Benay Pike, MD;  Location: [facility] ENDOSCOPY;  Service: Gastroenterology;  Laterality: N/A;  ? cyber knife surgery    ? PROSTATE SURGERY    ? ? ?There were no vitals filed for this visit. ? ? Subjective Assessment - 07/08/21 1104   ? ? Subjective Patient reports his back is hurting today. Hurts when he walks.   ? Pertinent History Pt is a 76 y/o male, returning to PT following admission to ED for evaluation of weakness s/p fall where pt was found to be COVID positive. Pt was in the hospital from 02/17/2021-02/23/2021. Pt was then d/c to home where he received [facility] PT. Pt reports increased LBP since d/c from hospital and decreased balance since last in outpatient PT. While pt using QC currently,  he reports he has been using a RW at home or grabbing onto the wall to steady himself while walking. Pt reports he has been walking home distances. PMH significant for TIA, a-fib, pulmonary embolism, TIA, gout, rhabdomyolysis.   ? Limitations Standing;Walking;House hold activities;Lifting   ? How long can you sit comfortably? not limited   ? How long can you stand comfortably? pt reports limited due to feeling unsteady   ? How long can you walk comfortably? Pt reports he is currently walking household distances   ? Patient Stated Goals Pt would like to improve his walking and balance   ? Currently in Pain? Yes   ? Pain Score 4    ? Pain Location Back   ? Pain Orientation Lower   ? Pain Descriptors / Indicators Aching   ? Pain Type Chronic pain;Acute pain   ? Pain Onset More than a month ago   ? Pain Frequency Intermittent   ? ?  ?  ? ?  ? ? ? ? ? ? ? ? ? ?  ?INTERVENTIONS -  ?  ?Supine:  ?Hamstring lengthening with leg on PT shoulder 60 seconds each LE ?Single knee to chest 60 seconds each LE ?Figure four stretch with PT overpressure 60 seconds each LE  ?LE rotation  60 seconds ? ?On mat table: ?LE windwhield whipers 10x  ?Green pball hamstring curls 20x ?Glute bridge 10x 3 second holds  ?SLR 11x each LE ?TrA contraction with green swiss ball pressing into ball with hands and knees 15x 3 second holds ?TrA contraction with swiss ball LE heel side 10x each LE  ?TrA contraction with alternating cross body UE/LE raises 10x each side with green swiss ball   ?Posterior pelvic tilt 10x into PT hand  ? ?Standing: ?Heel raises x20 BLEs ?  ?  ?Seated:  ?10x STS ?Large swiss ball forward trunk rollout 10x, diagonal 10x each side ?LAQ with adduction of ball between feet 15x  ?Adduction ball squeezes 12x 3 second holds ?Seated hamstring length 60 seconds each LE  ?6 " step toe taps 30 seconds for coordination and sequencing x2 trials  ?  ? ? ? ?Pt educated throughout session about proper posture and technique with exercises.  Improved exercise technique, movement at target joints, use of target muscles after min to mod verbal, visual, tactile cues. ? ? ?Patient presents with continuation of back pain this session requiring alteration of interventions. Patient responds well to heat and lengthening interventions in combination with strengthening interventions. Core activation tolerated well with patient voicing enjoyment/work with task. The pt will benefit from further skilled PT to improve strength, balance and mobility. ? ? ? ? ? ? ? ? ? ? ? ? ? ? ? ? PT Education - 07/08/21 1112   ? ? Education Details exercise technique, body mechanics   ? Person(s) Educated Patient   ? Methods Explanation;Demonstration;Tactile cues;Verbal cues   ? Comprehension Verbalized understanding;Returned demonstration;Verbal cues required;Tactile cues required   ? ?  ?  ? ?  ? ? ? PT Short Term Goals - 06/06/21 1201   ? ?  ? PT SHORT TERM GOAL #1  ? Title Patient will be independent in home exercise program to improve balance, and strength/mobility for better functional independence with ADLs and decreased fall risk.   ? Baseline 03/14/21: to be initiated within next 1-2 sessions 2/6: HEP compliance   ? Time 6   ? Period Weeks   ? Status Achieved   ? Target Date 06/06/21   ?  ? PT SHORT TERM GOAL #2  ? Title Pt will improve gross BLE strength to 5/5 in order to increase ease and safety with mobility and ADLs.   ? Baseline 03/14/21: strength 4+/5 B, greatest deficit in B ankle DFs 4-/5 B 2/6: grossly 4+/5 with df 4/5 3/13: see note   ? Time 6   ? Period Weeks   ? Status Partially Met   ? Target Date 04/25/21   ? ?  ?  ? ?  ? ? ? ? PT Long Term Goals - 06/24/21 1136   ? ?  ? PT LONG TERM GOAL #1  ? Title Patient will increase FOTO score to greater than 59 to demonstrate statistically significant improvement in mobility and quality of life.   ? Baseline 03/14/21: 58 2/6: 57% 3/13: 57.4%; 3/30: 62   ? Time 12   ? Period Weeks   ? Status Achieved   ? Target Date 06/06/21    ?  ? PT LONG TERM GOAL #2  ? Title Patient (> 32 years old) will complete five times sit to stand test in < 10 seconds indicating increased LE strength and decreased fall risk   ? Baseline 03/14/21: 12 seconds 2/6: 12 seconds 3/13: 13.8 seconds without  hands; 3/30: 13 sec; 4/1   ? Time 12   ? Period Weeks   ? Status On-going   ? Target Date 08/29/21   ?  ? PT LONG TERM GOAL #3  ? Title Pt will improve DGI by at least 3 points in order to demonstrate clinically significant improvement in balance and decreased risk for falls.   ? Baseline 03/14/21: 15/24 2/6: 17/24 3/13: 19/24; 3/30: 21/24   ? Time 12   ? Period Weeks   ? Status Achieved   ? Target Date 06/06/21   ?  ? PT LONG TERM GOAL #4  ? Title Pt will increase 6MWT by at least 54m(1664f in order to demonstrate clinically significant improvement in cardiopulmonary endurance and community ambulation   ? Baseline 03/14/21: 795 ft with QC 2/6: 865 ft without AD 3/13: 770 ft with walking stick with knee pain; 3/30: 1056 ft   ? Time 12   ? Period Weeks   ? Status Achieved   ? Target Date 08/29/21   ?  ? PT LONG TERM GOAL #5  ? Title The patient will be able to maintain >5 seconds of SLB on BLEs in order to decrease risk of falls when navigating obstacles, curbs and steps.   ? Baseline 03/14/21: RLE 4 seconds, LLE 2 seconds 2/6: L 3 seconds R 4 seconds 3/13: L 4 seconds with pain R 15 seconds; 3/30: L 4 sec R 8 sec   ? Time 12   ? Period Weeks   ? Status Partially Met   ? Target Date 08/29/21   ?  ? PT LONG TERM GOAL #6  ? Title Patient will increase 10 meter walk test to at least 1.0 m/s with LRD as to improve gait speed for better community ambulation and to reduce fall risk.   ? Baseline 03/14/21: 0.67 m/s 2/6: 1.3 m/s   ? Time 12   ? Period Weeks   ? Status Achieved   ? Target Date 06/06/21   ? ?  ?  ? ?  ? ? ? ? ? ? ? ? Plan - 07/08/21 1135   ? ? Clinical Impression Statement Patient presents with continuation of back pain this session requiring alteration of  interventions. Patient responds well to heat and lengthening interventions in combination with strengthening interventions. Core activation tolerated well with patient voicing enjoyment/work with task. The pt will b

## 2021-07-11 ENCOUNTER — Ambulatory Visit: Payer: Medicare Other

## 2021-07-11 DIAGNOSIS — G8929 Other chronic pain: Secondary | ICD-10-CM

## 2021-07-11 DIAGNOSIS — R262 Difficulty in walking, not elsewhere classified: Secondary | ICD-10-CM

## 2021-07-11 DIAGNOSIS — R2681 Unsteadiness on feet: Secondary | ICD-10-CM | POA: Diagnosis not present

## 2021-07-11 DIAGNOSIS — M6281 Muscle weakness (generalized): Secondary | ICD-10-CM

## 2021-07-11 NOTE — Therapy (Signed)
[facility] ?[place], [name], [address] ?Phone: [phone]   Fax:  [phone] ? ?Physical Therapy Treatment ? ?Patient Details  ?Name: Nicholas [name] ?MRN: [id number] ?Date of Birth: 26-May-1945 ?No data recorded ? ?Encounter Date: 07/11/2021 ? ? PT End of Session - 07/11/21 1135   ? ? Visit Number 35   ? Number of Visits 49   ? Date for PT Re-Evaluation 08/29/21   ? Authorization Type 4/10 PN 3/13   ? Authorization Time Period 03/14/2021-06/06/2021   ? Progress Note Due on Visit 20   ? PT Start Time 1102   ? PT Stop Time 1145   ? PT Time Calculation (min) 43 min   ? Equipment Utilized During Treatment Gait belt   ? Activity Tolerance Patient tolerated treatment well;No increased pain   ? Behavior During Therapy [facility] for tasks assessed/performed   ? ?  ?  ? ?  ? ? ?Past Medical History:  ?Diagnosis Date  ? A-fib ([place])   ? Cancer ([place]) 10/25/2019  ? Prostate  ? 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 Dysrhythmia   ? Pulmonary embolism ([place])   ? Skin cancer   ? TIA (transient ischemic attack)   ? ? ?Past Surgical History:  ?Procedure Laterality Date  ? APPENDECTOMY    ? COLONOSCOPY WITH PROPOFOL N/A 11/23/2019  ? Procedure: COLONOSCOPY WITH PROPOFOL;  Surgeon: Toledo, Benay Pike, MD;  Location: [facility] ENDOSCOPY;  Service: Gastroenterology;  Laterality: N/A;  ? cyber knife surgery    ? PROSTATE SURGERY    ? ? ?There were no vitals filed for this visit. ? ? Subjective Assessment - 07/11/21 1436   ? ? Subjective Pt reports some improvement in his back symptoms, but that it is not back to normal yet.   ? Pertinent History Pt is a 76 y/o male, returning to PT following admission to ED for evaluation of weakness s/p fall where pt was found to be COVID positive. Pt was in the hospital from 02/17/2021-02/23/2021. Pt was then d/c to home where he received [facility] PT. Pt reports increased LBP since d/c from hospital and decreased balance since last in outpatient PT.  While pt using QC currently, he reports he has been using a RW at home or grabbing onto the wall to steady himself while walking. Pt reports he has been walking home distances. PMH significant for TIA, a-fib, pulmonary embolism, TIA, gout, rhabdomyolysis.   ? Limitations Standing;Walking;House hold activities;Lifting   ? How long can you sit comfortably? not limited   ? How long can you stand comfortably? pt reports limited due to feeling unsteady   ? How long can you walk comfortably? Pt reports he is currently walking household distances   ? Patient Stated Goals Pt would like to improve his walking and balance   ? Currently in Pain? Yes   ? Pain Location Back   ? Pain Onset More than a month ago   ? ?  ?  ? ?  ? ? ?INTERVENTIONS ? ?Pt prone on plinth- ?Manual: ? ?Assessment of thoracic and lumbar spine. Pt hypomobile throughout. Pt TTP L1-4.  ?Central PA grade I-II L1-4 2x20-30 sec per level (pt found to be TTP in this region). Pt reports improvement with pain following CPAs.  ? ?TherEx: ?Prone press-up on forearms with 10 reps x 3 sec holds/rep ?Prone press-up on hands 5x ? ?cat-camel 5x. Pt difficulty moving into spinal ext. ? ?Glute bridge 15x ? ?  Supine: ?LTRs 16x each side ? ?Deadbug opposite UE to LE touch with march 16x each side  ? ?Deadbug isometric hip knee/flex 90 sec 20x sec, 3x30 sec.  ? ?Open book 10x Bilat ? ?Seated pball rollouts forward/backward,diagonal 10x for each ? ?Seated hamstring stretch 20 sec B ?Pt educated throughout session about proper posture and technique with exercises. Improved exercise technique, movement at target joints, use of target muscles after min to mod verbal, visual, tactile cues. ? PT Education - 07/11/21 1437   ? ? Education Details exercise technique, body mechanics   ? Person(s) Educated Patient   ? Methods Explanation;Demonstration;Tactile cues;Verbal cues   ? Comprehension Verbalized understanding;Returned demonstration;Verbal cues required;Need further instruction    ? ?  ?  ? ?  ? ? ? PT Short Term Goals - 06/06/21 1201   ? ?  ? PT SHORT TERM GOAL #1  ? Title Patient will be independent in home exercise program to improve balance, and strength/mobility for better functional independence with ADLs and decreased fall risk.   ? Baseline 03/14/21: to be initiated within next 1-2 sessions 2/6: HEP compliance   ? Time 6   ? Period Weeks   ? Status Achieved   ? Target Date 06/06/21   ?  ? PT SHORT TERM GOAL #2  ? Title Pt will improve gross BLE strength to 5/5 in order to increase ease and safety with mobility and ADLs.   ? Baseline 03/14/21: strength 4+/5 B, greatest deficit in B ankle DFs 4-/5 B 2/6: grossly 4+/5 with df 4/5 3/13: see note   ? Time 6   ? Period Weeks   ? Status Partially Met   ? Target Date 04/25/21   ? ?  ?  ? ?  ? ? ? ? PT Long Term Goals - 06/24/21 1136   ? ?  ? PT LONG TERM GOAL #1  ? Title Patient will increase FOTO score to greater than 59 to demonstrate statistically significant improvement in mobility and quality of life.   ? Baseline 03/14/21: 58 2/6: 57% 3/13: 57.4%; 3/30: 62   ? Time 12   ? Period Weeks   ? Status Achieved   ? Target Date 06/06/21   ?  ? PT LONG TERM GOAL #2  ? Title Patient (> 10 years old) will complete five times sit to stand test in < 10 seconds indicating increased LE strength and decreased fall risk   ? Baseline 03/14/21: 12 seconds 2/6: 12 seconds 3/13: 13.8 seconds without hands; 3/30: 13 sec; 4/1   ? Time 12   ? Period Weeks   ? Status On-going   ? Target Date 08/29/21   ?  ? PT LONG TERM GOAL #3  ? Title Pt will improve DGI by at least 3 points in order to demonstrate clinically significant improvement in balance and decreased risk for falls.   ? Baseline 03/14/21: 15/24 2/6: 17/24 3/13: 19/24; 3/30: 21/24   ? Time 12   ? Period Weeks   ? Status Achieved   ? Target Date 06/06/21   ?  ? PT LONG TERM GOAL #4  ? Title Pt will increase 6MWT by at least 61m(166f in order to demonstrate clinically significant improvement in cardiopulmonary  endurance and community ambulation   ? Baseline 03/14/21: 795 ft with QC 2/6: 865 ft without AD 3/13: 770 ft with walking stick with knee pain; 3/30: 1056 ft   ? Time 12   ? Period Weeks   ?  Status Achieved   ? Target Date 08/29/21   ?  ? PT LONG TERM GOAL #5  ? Title The patient will be able to maintain >5 seconds of SLB on BLEs in order to decrease risk of falls when navigating obstacles, curbs and steps.   ? Baseline 03/14/21: RLE 4 seconds, LLE 2 seconds 2/6: L 3 seconds R 4 seconds 3/13: L 4 seconds with pain R 15 seconds; 3/30: L 4 sec R 8 sec   ? Time 12   ? Period Weeks   ? Status Partially Met   ? Target Date 08/29/21   ?  ? PT LONG TERM GOAL #6  ? Title Patient will increase 10 meter walk test to at least 1.0 m/s with LRD as to improve gait speed for better community ambulation and to reduce fall risk.   ? Baseline 03/14/21: 0.67 m/s 2/6: 1.3 m/s   ? Time 12   ? Period Weeks   ? Status Achieved   ? Target Date 06/06/21   ? ?  ?  ? ?  ? ? ? ? ? ? ? ? Plan - 07/11/21 1452   ? ? Clinical Impression Statement Pt continues to report LBP, although it has improved. Pain affecting pt's postural stability with STS, gait. Continued focus this session on interventions to target pain, strength and mobility. Pt hypomobile throughout lumbar and thoracic spine where pt has difficulty moving into an extended position. Pt responded well to CPAs over L1-4, reporting it decrease his back pain. The pt will benefit from further skilled PT to improve strength, balance and mobility.   ? Personal Factors and Comorbidities Age;Past/Current Experience;Comorbidity 3+   ? Comorbidities A-fib, lumbar disc disease, prostate CA, TIA, gout, hx of pulmonary embolism   ? Examination-Activity Limitations Carry;Lift;Stand;Locomotion Level;Bend;Dressing;Reach Overhead;Squat;Transfers;Caring for Others;Stairs   ? Examination-Participation Restrictions Community Activity;Yard Work;Cleaning;Laundry;Shop;Church   ? Stability/Clinical Decision Making  Evolving/Moderate complexity   ? Rehab Potential Good   ? PT Frequency 2x / week   ? PT Duration 12 weeks   ? PT Treatment/Interventions ADLs/Self Care Home Management;Biofeedback;Aquatic Therapy;Canalith

## 2021-07-15 ENCOUNTER — Ambulatory Visit: Payer: Medicare Other

## 2021-07-15 DIAGNOSIS — R262 Difficulty in walking, not elsewhere classified: Secondary | ICD-10-CM

## 2021-07-15 DIAGNOSIS — M6281 Muscle weakness (generalized): Secondary | ICD-10-CM

## 2021-07-15 DIAGNOSIS — M545 Low back pain, unspecified: Secondary | ICD-10-CM

## 2021-07-15 DIAGNOSIS — R2681 Unsteadiness on feet: Secondary | ICD-10-CM | POA: Diagnosis not present

## 2021-07-15 NOTE — Therapy (Signed)
[facility] MAIN [facility] SERVICES [address] [place], [place], [address] Phone: [phone]   Fax:  [phone]  Physical Therapy Treatment  Patient Details  Name: Nicholas [name] MRN: [id number] Date of Birth: 1945/09/01 No data recorded  Encounter Date: 07/15/2021   PT End of Session - 07/15/21 1719     Visit Number 36    Number of Visits 49    Date for PT Re-Evaluation 08/29/21    Authorization Type 4/10 PN 3/13    Authorization Time Period 03/14/2021-06/06/2021    Progress Note Due on Visit 20    PT Start Time 1141    PT Stop Time 1228    PT Time Calculation (min) 47 min    Equipment Utilized During Treatment Gait belt    Activity Tolerance Patient tolerated treatment well;No increased pain    Behavior During Therapy [facility] for tasks assessed/performed             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   Subjective Assessment - 07/15/21 1143     Subjective Pt reports he has felt less steady than usual over the past couple of days. He reports continued back issues, but says he has been leaning over " a lot" recently to put files away.    Pertinent History Pt is a 76 y/o male, returning to PT following admission to ED for evaluation of weakness s/p fall where pt was found to be COVID positive. Pt was in the hospital from 02/17/2021-02/23/2021. Pt was then d/c to home where he received HH PT. Pt reports  increased LBP since d/c from hospital and decreased balance since last in outpatient PT. While pt using QC currently, he reports he has been using a RW at home or grabbing onto the wall to steady himself while walking. Pt reports he has been walking home distances. PMH significant for TIA, a-fib, pulmonary embolism, TIA, gout, rhabdomyolysis.    Limitations Standing;Walking;House hold activities;Lifting    How long can you sit comfortably? not limited    How long can you stand comfortably? pt reports limited due to feeling unsteady    How long can you walk comfortably? Pt reports he is currently walking household distances    Patient Stated Goals Pt would like to improve his walking and balance    Currently in Pain? Yes    Pain Location Back    Pain Orientation Lower    Pain Onset More than a month ago               INTERVENTIONS  TherEx- Seated pball rollouts forward/backward,diagonal 10x for each  On mat table: Modified child's pose for quad stretch 30 sec B Table top position cat-cow 12x. Exhibits improved ability to ext. At lumbar spine Open book 15x Bilat  Prone stretch on wedge for extension x several minutes Prone  press-up (wedge supported) 10x 2 sets. Challenging  Glute bridge 21x  LTRs x 1 min   Deadbug opposite UE to LE touch with march 16x each side    LTRs x 1 min    NMR:  Tandem gait fwd/bckwd in // bars 5x each direction. Intermittent UE support Tandem stance 2x30 sec each LE. Cuing for upright posture. Intermittent UE support Standing NBOS EC 2x60 sec   Pt educated throughout session about proper posture and technique with exercises. Improved exercise technique, movement at target joints, use of target muscles after min to mod verbal, visual, tactile cues.     PT Short Term Goals - 06/06/21 1201       PT SHORT TERM GOAL #1   Title Patient will be independent in home exercise program to improve balance, and strength/mobility for better functional  independence with ADLs and decreased fall risk.    Baseline 03/14/21: to be initiated within next 1-2 sessions 2/6: HEP compliance    Time 6    Period Weeks    Status Achieved    Target Date 06/06/21      PT SHORT TERM GOAL #2   Title Pt will improve gross BLE strength to 5/5 in order to increase ease and safety with mobility and ADLs.    Baseline 03/14/21: strength 4+/5 B, greatest deficit in B ankle DFs 4-/5 B 2/6: grossly 4+/5 with df 4/5 3/13: see note    Time 6    Period Weeks    Status Partially Met    Target Date 04/25/21               PT Long Term Goals - 06/24/21 1136       PT LONG TERM GOAL #1   Title Patient will increase FOTO score to greater than 59 to demonstrate statistically significant improvement in mobility and quality of life.    Baseline 03/14/21: 58 2/6: 57% 3/13: 57.4%; 3/30: 62    Time 12    Period Weeks    Status Achieved    Target Date 06/06/21      PT LONG TERM GOAL #2   Title Patient (> 25 years old) will complete five times sit to stand test in < 10 seconds indicating increased LE strength and decreased fall risk    Baseline 03/14/21: 12 seconds 2/6: 12 seconds 3/13: 13.8 seconds without hands; 3/30: 13 sec; 4/1    Time 12    Period Weeks    Status On-going    Target Date 08/29/21      PT LONG TERM GOAL #3   Title Pt will improve DGI by at least 3 points in order to demonstrate clinically significant improvement in balance and decreased risk for falls.    Baseline 03/14/21: 15/24 2/6: 17/24 3/13: 19/24; 3/30: 21/24    Time 12    Period Weeks    Status Achieved    Target Date 06/06/21      PT LONG TERM GOAL #4   Title Pt will increase by at least 38m (163ft) in order to demonstrate clinically significant improvement in cardiopulmonary endurance and community ambulation    Baseline 03/14/21: 795 ft with QC 2/6: 865 ft without AD 3/13: 770 ft with walking stick with knee pain; 3/30: 1056 ft    Time 12    Period Weeks    Status Achieved     Target Date 08/29/21      PT LONG TERM GOAL #5   Title The patient  will be able to maintain >5 seconds of SLB on BLEs in order to decrease risk of falls when navigating obstacles, curbs and steps.    Baseline 03/14/21: RLE 4 seconds, LLE 2 seconds 2/6: L 3 seconds R 4 seconds 3/13: L 4 seconds with pain R 15 seconds; 3/30: L 4 sec R 8 sec    Time 12    Period Weeks    Status Partially Met    Target Date 08/29/21      PT LONG TERM GOAL #6   Title Patient will increase 10 meter walk test to at least 1.0 m/s with LRD as to improve gait speed for better community ambulation and to reduce fall risk.    Baseline 03/14/21: 0.67 m/s 2/6: 1.3 m/s    Time 12    Period Weeks    Status Achieved    Target Date 06/06/21                   Plan - 07/15/21 1721     Clinical Impression Statement Continued focus on improve back mobility and LBP. Following therex pt did report his back felt "loosened up." He exhibited improved ability to extend at lumbar spine with cat-cow, still has significant trouble with prone press-up and prone stretch with wedge support. Pt does appear to have lateral shift at trunk with gait, will further assess future visit. Pt required intermittent UE support with all balance interventions, and had most difficulty with NBOS EC. The pt will benefit from further skilled PT to improve pain, balance, strength and mobility.    Personal Factors and Comorbidities Age;Past/Current Experience;Comorbidity 3+    Comorbidities A-fib, lumbar disc disease, prostate CA, TIA, gout, hx of pulmonary embolism    Examination-Activity Limitations Carry;Lift;Stand;Locomotion Level;Bend;Dressing;Reach Overhead;Squat;Transfers;Caring for Others;Stairs    Examination-Participation Restrictions Community Activity;Yard Work;Cleaning;Laundry;Shop;Church    Stability/Clinical Decision Making Evolving/Moderate complexity    Rehab Potential Good    PT Frequency 2x / week    PT Duration 12 weeks    PT  Treatment/Interventions ADLs/Self Care Home Management;Biofeedback;Aquatic Therapy;Canalith Repostioning;Cryotherapy;Electrical Stimulation;Iontophoresis 4mg /ml Dexamethasone;Moist Heat;Traction;Ultrasound;Gait training;Stair training;DME Instruction;Functional mobility training;Therapeutic activities;Therapeutic exercise;Balance training;Neuromuscular re-education;Patient/family education;Manual techniques;Passive range of motion;Dry needling;Energy conservation;Vestibular;Joint Manipulations;Orthotic Fit/Training;Splinting;Taping;Visual/perceptual remediation/compensation    PT Next Visit Plan Progress balance, gait, cardioresp. endurance, strengthening, continue POC as previously indicated    PT Home Exercise Plan 1/9: Access Code: [email or web address]; 4/27: Access Code: [email or web address]    Consulted and Agree with Plan of Care Patient             Patient will benefit from skilled therapeutic intervention in order to improve the following deficits and impairments:  Abnormal gait, Decreased balance, Decreased range of motion, Decreased strength, Hypomobility, Impaired sensation, Pain, Improper body mechanics, Impaired flexibility, Decreased mobility, Decreased activity tolerance, Decreased endurance, Difficulty walking, Increased muscle spasms, Postural dysfunction  Visit Diagnosis: Chronic bilateral low back pain, unspecified whether sciatica present  Muscle weakness (generalized)  Unsteadiness on feet  Difficulty in walking, not elsewhere classified     Problem List Patient Active Problem List   Diagnosis Date Noted   COVID-19 virus infection 02/17/2021   A-fib (HCC)    TIA (transient ischemic attack)    Lactic acidosis    Hyponatremia    Hypotension    Generalized weakness     Baird Kay, PT 07/15/2021, 5:25 PM  [facility] MAIN [facility] SERVICES [address] [place], [place], [address] Phone: [phone]   Fax:  [phone]  Name: Iziaha Mahood  Goebel MRN: [id number] Date of Birth: 1945/06/16

## 2021-07-18 ENCOUNTER — Ambulatory Visit: Payer: Medicare Other

## 2021-07-18 DIAGNOSIS — R262 Difficulty in walking, not elsewhere classified: Secondary | ICD-10-CM

## 2021-07-18 DIAGNOSIS — R2681 Unsteadiness on feet: Secondary | ICD-10-CM

## 2021-07-18 DIAGNOSIS — M545 Low back pain, unspecified: Secondary | ICD-10-CM

## 2021-07-18 DIAGNOSIS — M6281 Muscle weakness (generalized): Secondary | ICD-10-CM

## 2021-07-18 NOTE — Therapy (Signed)
[place] [facility] MAIN REHAB SERVICES ?[place], [name], [address] ?Phone: [phone]   Fax:  [phone] ? ?Physical Therapy Treatment ? ?Patient Details  ?Name: Nicholas [name] ?MRN: [id number] ?Date of Birth: 02-13-1946 ?No data recorded ? ?Encounter Date: 07/18/2021 ? ? PT End of Session - 07/18/21 1444   ? ? Visit Number 39   ? Number of Visits 49   ? Date for PT Re-Evaluation 08/29/21   ? Authorization Type 4/10 PN 3/13   ? Authorization Time Period 03/14/2021-06/06/2021   ? Progress Note Due on Visit 20   ? PT Start Time 1101   ? PT Stop Time 1145   ? PT Time Calculation (min) 44 min   ? Equipment Utilized During Treatment Gait belt   ? Activity Tolerance Patient tolerated treatment well;No increased pain   ? Behavior During Therapy [facility] for tasks assessed/performed   ? ?  ?  ? ?  ? ? ?Past Medical History:  ?Diagnosis Date  ? A-fib ([place])   ? Cancer ([place]) 10/25/2019  ? Prostate  ? 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 Dysrhythmia   ? Pulmonary embolism ([place])   ? Skin cancer   ? TIA (transient ischemic attack)   ? ? ?Past Surgical History:  ?Procedure Laterality Date  ? APPENDECTOMY    ? COLONOSCOPY WITH PROPOFOL N/A 11/23/2019  ? Procedure: COLONOSCOPY WITH PROPOFOL;  Surgeon: Toledo, Benay Pike, MD;  Location: [facility] ENDOSCOPY;  Service: Gastroenterology;  Laterality: N/A;  ? cyber knife surgery    ? PROSTATE SURGERY    ? ? ?There were no vitals filed for this visit. ? ? Subjective Assessment - 07/18/21 1104   ? ? Subjective Pt reports his back has felt stiff lately but it feels better today. He slept better last night. Pt reports he hsa had low energy lately due to poor sleep.   ? Pertinent History Pt is a 76 y/o male, returning to PT following admission to ED for evaluation of weakness s/p fall where pt was found to be COVID positive. Pt was in the hospital from 02/17/2021-02/23/2021. Pt was then d/c to home where he received [facility] PT. Pt reports increased LBP since d/c  from hospital and decreased balance since last in outpatient PT. While pt using QC currently, he reports he has been using a RW at home or grabbing onto the wall to steady himself while walking. Pt reports he has been walking home distances. PMH significant for TIA, a-fib, pulmonary embolism, TIA, gout, rhabdomyolysis.   ? Limitations Standing;Walking;House hold activities;Lifting   ? How long can you sit comfortably? not limited   ? How long can you stand comfortably? pt reports limited due to feeling unsteady   ? How long can you walk comfortably? Pt reports he is currently walking household distances   ? Patient Stated Goals Pt would like to improve his walking and balance   ? Currently in Pain? Yes   ? Pain Location Back   ? Pain Onset More than a month ago   ? ?  ?  ? ?  ? ? ?INTERVENTIONS ?  ?TherEx- ?Supine on mat table: ?LTRs 20x bilat. Reports R side feels tighter ? ?Knee to chest stretch 2x30 sec bilat. Pt reports feels like a gentle stretch ? ?Glute bridge 20x ?  ?Deadbug opposite UE to LE touch with march 21x 2 sets each side. Pt rates as medium ? ?LTR 10x bilat  ?  ?Treadmill LE and  cardio endurance training - HIIT - pt base speed 1.6-1.7 mph. He ambulates up to 2.0, 2.2, 2.3, 2.5 (2x) mph and 30 sec bursts. Performed at 1% and 2% elevation. ? ?  ?NMR: gait belt donned and CGA provided throughout ? ?Over 5 and 10 meter track: ?Semi-tandem gait 4x ?Forward ambulation with EC x multiple reps, followed by EO ambulating backwards x multiple reps ?Comments: general unsteadiness throughout. Use of step-strategy. ? ? ? ?Pt educated throughout session about proper posture and technique with exercises. Improved exercise technique, movement at target joints, use of target muscles after min to mod verbal, visual, tactile cues. ? ? ? ? PT Education - 07/18/21 1444   ? ? Education Details exercise technique, body mechanics   ? Person(s) Educated Patient   ? Methods Explanation;Demonstration;Verbal cues   ?  Comprehension Verbalized understanding;Returned demonstration;Verbal cues required;Need further instruction   ? ?  ?  ? ?  ? ? ? PT Short Term Goals - 06/06/21 1201   ? ?  ? PT SHORT TERM GOAL #1  ? Title Patient will be independent in home exercise program to improve balance, and strength/mobility for better functional independence with ADLs and decreased fall risk.   ? Baseline 03/14/21: to be initiated within next 1-2 sessions 2/6: HEP compliance   ? Time 6   ? Period Weeks   ? Status Achieved   ? Target Date 06/06/21   ?  ? PT SHORT TERM GOAL #2  ? Title Pt will improve gross BLE strength to 5/5 in order to increase ease and safety with mobility and ADLs.   ? Baseline 03/14/21: strength 4+/5 B, greatest deficit in B ankle DFs 4-/5 B 2/6: grossly 4+/5 with df 4/5 3/13: see note   ? Time 6   ? Period Weeks   ? Status Partially Met   ? Target Date 04/25/21   ? ?  ?  ? ?  ? ? ? ? PT Long Term Goals - 06/24/21 1136   ? ?  ? PT LONG TERM GOAL #1  ? Title Patient will increase FOTO score to greater than 59 to demonstrate statistically significant improvement in mobility and quality of life.   ? Baseline 03/14/21: 58 2/6: 57% 3/13: 57.4%; 3/30: 62   ? Time 12   ? Period Weeks   ? Status Achieved   ? Target Date 06/06/21   ?  ? PT LONG TERM GOAL #2  ? Title Patient (> 77 years old) will complete five times sit to stand test in < 10 seconds indicating increased LE strength and decreased fall risk   ? Baseline 03/14/21: 12 seconds 2/6: 12 seconds 3/13: 13.8 seconds without hands; 3/30: 13 sec; 4/1   ? Time 12   ? Period Weeks   ? Status On-going   ? Target Date 08/29/21   ?  ? PT LONG TERM GOAL #3  ? Title Pt will improve DGI by at least 3 points in order to demonstrate clinically significant improvement in balance and decreased risk for falls.   ? Baseline 03/14/21: 15/24 2/6: 17/24 3/13: 19/24; 3/30: 21/24   ? Time 12   ? Period Weeks   ? Status Achieved   ? Target Date 06/06/21   ?  ? PT LONG TERM GOAL #4  ? Title Pt will  increase 6MWT by at least 57m(1664f in order to demonstrate clinically significant improvement in cardiopulmonary endurance and community ambulation   ? Baseline 03/14/21: 795 ft  with QC 2/6: 865 ft without AD 3/13: 770 ft with walking stick with knee pain; 3/30: 1056 ft   ? Time 12   ? Period Weeks   ? Status Achieved   ? Target Date 08/29/21   ?  ? PT LONG TERM GOAL #5  ? Title The patient will be able to maintain >5 seconds of SLB on BLEs in order to decrease risk of falls when navigating obstacles, curbs and steps.   ? Baseline 03/14/21: RLE 4 seconds, LLE 2 seconds 2/6: L 3 seconds R 4 seconds 3/13: L 4 seconds with pain R 15 seconds; 3/30: L 4 sec R 8 sec   ? Time 12   ? Period Weeks   ? Status Partially Met   ? Target Date 08/29/21   ?  ? PT LONG TERM GOAL #6  ? Title Patient will increase 10 meter walk test to at least 1.0 m/s with LRD as to improve gait speed for better community ambulation and to reduce fall risk.   ? Baseline 03/14/21: 0.67 m/s 2/6: 1.3 m/s   ? Time 12   ? Period Weeks   ? Status Achieved   ? Target Date 06/06/21   ? ?  ?  ? ?  ? ? ? ? ? ? ? ? Plan - 07/18/21 1445   ? ? Clinical Impression Statement Pt tolerates interventions well without pain. Pt returns to treadmill HIIT, ambulating up to 2.5 mph  at 1-2% elevation. Pt with notable fatigue. Pt challenged with all dynamic balance interventions on this date. The pt will benefit from further skilled PT to improve pain, balance, strength and mobility.   ? Personal Factors and Comorbidities Age;Past/Current Experience;Comorbidity 3+   ? Comorbidities A-fib, lumbar disc disease, prostate CA, TIA, gout, hx of pulmonary embolism   ? Examination-Activity Limitations Carry;Lift;Stand;Locomotion Level;Bend;Dressing;Reach Overhead;Squat;Transfers;Caring for Others;Stairs   ? Examination-Participation Restrictions Community Activity;Yard Work;Cleaning;Laundry;Shop;Church   ? Stability/Clinical Decision Making Evolving/Moderate complexity   ? Rehab  Potential Good   ? PT Frequency 2x / week   ? PT Duration 12 weeks   ? PT Treatment/Interventions ADLs/Self Care Home Management;Biofeedback;Aquatic Therapy;Canalith Repostioning;Cryotherapy;Electrical Stimulation;Iontopho

## 2021-07-22 ENCOUNTER — Ambulatory Visit: Payer: Medicare Other

## 2021-07-22 DIAGNOSIS — R262 Difficulty in walking, not elsewhere classified: Secondary | ICD-10-CM

## 2021-07-22 DIAGNOSIS — R2681 Unsteadiness on feet: Secondary | ICD-10-CM | POA: Diagnosis not present

## 2021-07-22 DIAGNOSIS — M545 Low back pain, unspecified: Secondary | ICD-10-CM

## 2021-07-22 DIAGNOSIS — M6281 Muscle weakness (generalized): Secondary | ICD-10-CM

## 2021-07-22 NOTE — Therapy (Signed)
[place] ?[place], [name], [address] ?Phone: [phone]   Fax:  [phone] ? ?Physical Therapy Treatment ? ?Patient Details  ?Name: Nicholas [name] ?MRN: [id number] ?Date of Birth: [birth year] ?No data recorded ? ?Encounter Date: 07/22/2021 ? ? PT End of Session - 07/22/21 1059   ? ? Visit Number 38   ? Number of Visits 49   ? Date for PT Re-Evaluation 08/29/21   ? Authorization Type 8/10 PN 3/13   ? Authorization Time Period 03/14/2021-06/06/2021   ? Progress Note Due on Visit 20   ? PT Start Time 1100   ? PT Stop Time 1144   ? PT Time Calculation (min) 44 min   ? Equipment Utilized During Treatment Gait belt   ? Activity Tolerance Patient tolerated treatment well;No increased pain   ? Behavior During Therapy [facility] for tasks assessed/performed   ? ?  ?  ? ?  ? ? ?Past Medical History:  ?Diagnosis Date  ? A-fib ([place])   ? Cancer ([place]) 10/25/2019  ? Prostate  ? 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 Dysrhythmia   ? Pulmonary embolism ([place])   ? Skin cancer   ? TIA (transient ischemic attack)   ? ? ?Past Surgical History:  ?Procedure Laterality Date  ? APPENDECTOMY    ? COLONOSCOPY WITH PROPOFOL N/A 11/23/2019  ? Procedure: COLONOSCOPY WITH PROPOFOL;  Surgeon: Toledo, Benay Pike, MD;  Location: [facility] ENDOSCOPY;  Service: Gastroenterology;  Laterality: N/A;  ? cyber knife surgery    ? PROSTATE SURGERY    ? ? ?There were no vitals filed for this visit. ? ? Subjective Assessment - 07/22/21 1115   ? ? Subjective Patient reports his back felt very stiff. Has been having difficulty picking up objects.   ? Pertinent History Pt is a 76 y/o male, returning to PT following admission to ED for evaluation of weakness s/p fall where pt was found to be COVID positive. Pt was in the hospital from 02/17/2021-02/23/2021. Pt was then d/c to home where he received [facility] PT. Pt reports increased LBP since d/c from hospital and decreased balance since last in outpatient PT.  While pt using QC currently, he reports he has been using a RW at home or grabbing onto the wall to steady himself while walking. Pt reports he has been walking home distances. PMH significant for TIA, a-fib, pulmonary embolism, TIA, gout, rhabdomyolysis.   ? Limitations Standing;Walking;House hold activities;Lifting   ? How long can you sit comfortably? not limited   ? How long can you stand comfortably? pt reports limited due to feeling unsteady   ? How long can you walk comfortably? Pt reports he is currently walking household distances   ? Patient Stated Goals Pt would like to improve his walking and balance   ? Currently in Pain? Yes   ? Pain Score 1    ? Pain Location Back   ? Pain Orientation Lower   ? Pain Descriptors / Indicators Aching   ? Pain Type Chronic pain   ? Pain Onset More than a month ago   ? Pain Frequency Intermittent   ? ?  ?  ? ?  ? ? ? ? ? ? ?INTERVENTIONS ?  ?TherEx- ?Seated pball rollouts forward/backward, diagonal 10x for each ?  ?On mat table: ?Supine: ?Hamstring lengthening with leg on PT shoulder 30 seconds each LE ?Figure four stretch with overpressure 30 seconds each LE ?LE rotation 60 seconds ?  Glute bridge 11x 2 trials  ?Deadbug opposite UE to LE touch with march 16x each side  ?Posterior pelvic tilt 10x 5 second holds ? ?prone ?Prone press-up 10x 2 sets. ?Hip flexor lengthening stretch 30 seconds  ?  ?  ?  ?  ?NMR: ?  ?Standing with CGA next to support surface:  ?Airex pad: static stand 30 seconds x 2 trials, noticeable trembling of ankles/LE's with fatigue and challenge to maintain stability ?Airex pad: horizontal head turns 30 seconds scanning room 10x ; cueing for arc of motion  ?Airex pad: vertical head turns 30 seconds, cueing for arc of motion, noticeable sway with upward gaze increasing demand on ankle righting reaction musculature ? ?Airex pad beam: lateral stepping 4x length of // bars ?Airex balance beam: tandem walking 4x length of // bars ?Airex balance beam: PVC chest  press 10x, straight arm raise 10x ? ?  ?Pt educated throughout session about proper posture and technique with exercises. Improved exercise technique, movement at target joints, use of target muscles after min to mod verbal, visual, tactile cues. ? ? ? ? ?Patient presents with excellent motivation. He has improved core activation and sequencing with no pain increase. Reports his back feels looser by end of session. Patient wants to practice getting up/down from the floor in a future session due to challenge of doing it. The pt will benefit from further skilled PT to improve pain, balance, strength and mobility ? ? ? ? ? ? ? ? ? ? ? ? ? ? ? ? ? ? PT Education - 07/22/21 1059   ? ? Education Details exercise technique, body mechanics   ? Person(s) Educated Patient   ? Methods Explanation;Demonstration;Tactile cues;Verbal cues   ? Comprehension Verbalized understanding;Returned demonstration;Verbal cues required;Tactile cues required   ? ?  ?  ? ?  ? ? ? PT Short Term Goals - 06/06/21 1201   ? ?  ? PT SHORT TERM GOAL #1  ? Title Patient will be independent in home exercise program to improve balance, and strength/mobility for better functional independence with ADLs and decreased fall risk.   ? Baseline 03/14/21: to be initiated within next 1-2 sessions 2/6: HEP compliance   ? Time 6   ? Period Weeks   ? Status Achieved   ? Target Date 06/06/21   ?  ? PT SHORT TERM GOAL #2  ? Title Pt will improve gross BLE strength to 5/5 in order to increase ease and safety with mobility and ADLs.   ? Baseline 03/14/21: strength 4+/5 B, greatest deficit in B ankle DFs 4-/5 B 2/6: grossly 4+/5 with df 4/5 3/13: see note   ? Time 6   ? Period Weeks   ? Status Partially Met   ? Target Date 04/25/21   ? ?  ?  ? ?  ? ? ? ? PT Long Term Goals - 06/24/21 1136   ? ?  ? PT LONG TERM GOAL #1  ? Title Patient will increase FOTO score to greater than 59 to demonstrate statistically significant improvement in mobility and quality of life.   ?  Baseline 03/14/21: 58 2/6: 57% 3/13: 57.4%; 3/30: 62   ? Time 12   ? Period Weeks   ? Status Achieved   ? Target Date 06/06/21   ?  ? PT LONG TERM GOAL #2  ? Title Patient (> 40 years old) will complete five times sit to stand test in < 10 seconds indicating increased LE strength  and decreased fall risk   ? Baseline 03/14/21: 12 seconds 2/6: 12 seconds 3/13: 13.8 seconds without hands; 3/30: 13 sec; 4/1   ? Time 12   ? Period Weeks   ? Status On-going   ? Target Date 08/29/21   ?  ? PT LONG TERM GOAL #3  ? Title Pt will improve DGI by at least 3 points in order to demonstrate clinically significant improvement in balance and decreased risk for falls.   ? Baseline 03/14/21: 15/24 2/6: 17/24 3/13: 19/24; 3/30: 21/24   ? Time 12   ? Period Weeks   ? Status Achieved   ? Target Date 06/06/21   ?  ? PT LONG TERM GOAL #4  ? Title Pt will increase 6MWT by at least 35m(1672f in order to demonstrate clinically significant improvement in cardiopulmonary endurance and community ambulation   ? Baseline 03/14/21: 795 ft with QC 2/6: 865 ft without AD 3/13: 770 ft with walking stick with knee pain; 3/30: 1056 ft   ? Time 12   ? Period Weeks   ? Status Achieved   ? Target Date 08/29/21   ?  ? PT LONG TERM GOAL #5  ? Title The patient will be able to maintain >5 seconds of SLB on BLEs in order to decrease risk of falls when navigating obstacles, curbs and steps.   ? Baseline 03/14/21: RLE 4 seconds, LLE 2 seconds 2/6: L 3 seconds R 4 seconds 3/13: L 4 seconds with pain R 15 seconds; 3/30: L 4 sec R 8 sec   ? Time 12   ? Period Weeks   ? Status Partially Met   ? Target Date 08/29/21   ?  ? PT LONG TERM GOAL #6  ? Title Patient will increase 10 meter walk test to at least 1.0 m/s with LRD as to improve gait speed for better community ambulation and to reduce fall risk.   ? Baseline 03/14/21: 0.67 m/s 2/6: 1.3 m/s   ? Time 12   ? Period Weeks   ? Status Achieved   ? Target Date 06/06/21   ? ?  ?  ? ?  ? ? ? ? ? ? ? ? Plan - 07/22/21 1149   ? ?  Clinical Impression Statement Patient presents with excellent motivation. He has improved core activation and sequencing with no pain increase. Reports his back feels looser by end of session. Patient wants to p

## 2021-07-25 ENCOUNTER — Ambulatory Visit: Payer: Medicare Other

## 2021-07-25 DIAGNOSIS — M545 Low back pain, unspecified: Secondary | ICD-10-CM

## 2021-07-25 DIAGNOSIS — M6281 Muscle weakness (generalized): Secondary | ICD-10-CM

## 2021-07-25 DIAGNOSIS — R2681 Unsteadiness on feet: Secondary | ICD-10-CM

## 2021-07-25 NOTE — Therapy (Signed)
[place] [facility] [address] [place], [name], [address] Phone: [phone]   Fax:  [phone]  Physical Therapy Treatment  Patient Details  Name: Nicholas [name] MRN: [id number] Date of Birth: 11/04/1945 No data recorded  Encounter Date: 07/25/2021   PT End of Session - 07/25/21 1702     Visit Number 39    Number of Visits 56    Date for PT Re-Evaluation 08/29/21    Authorization Type 8/10 PN 3/13    Authorization Time Period 03/14/2021-06/06/2021    Progress Note Due on Visit 20    PT Start Time 1103    PT Stop Time 1145    PT Time Calculation (min) 42 min    Equipment Utilized During Treatment Gait belt    Activity Tolerance Patient tolerated treatment well;No increased pain    Behavior During Therapy [facility] for tasks assessed/performed             Past Medical History:  Diagnosis Date   A-fib ([place])    Cancer ([place]) 10/25/2019   Prostate   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   Subjective Assessment - 07/25/21 1106     Subjective Pt reports his back is feeling better. He took a walk yesterday (3/4 mile) and reports that it helped. He spent an hour walking in a store as well.    Pertinent History Pt is a 76 y/o male, returning to PT following admission to ED for evaluation of weakness s/p fall where pt was found to be COVID positive. Pt was in the hospital from 02/17/2021-02/23/2021. Pt was then d/c to home where he received [facility] PT. Pt reports increased LBP since d/c from  hospital and decreased balance since last in outpatient PT. While pt using QC currently, he reports he has been using a RW at home or grabbing onto the wall to steady himself while walking. Pt reports he has been walking home distances. PMH significant for TIA, a-fib, pulmonary embolism, TIA, gout, rhabdomyolysis.    Limitations Standing;Walking;House hold activities;Lifting    How long can you sit comfortably? not limited    How long can you stand comfortably? pt reports limited due to feeling unsteady    How long can you walk comfortably? Pt reports he is currently walking household distances    Patient Stated Goals Pt would like to improve his walking and balance    Currently in Pain? No/denies    Pain Onset More than a month ago               INTERVENTIONS   TherEx-  Treadmill mm and cardio endurance - 2% elevation, base level of ambulation 1.7 mph, ambulates in 30 sec bouts to 2.2, 2.3, 2.5, 2.6 mph x 6 minutes Seated rest break  Heel raises 21x bilat  STS 16x2 sets    NMR: In // bars with gait belt donned and CGA provided unless otherwise specified  FWD/BCKWD with horizontal, vertical head turns  x multiple reps length of bars. Intermittent UE support required.   Airex beam lateral stepping 10x length of beam. More difficulty stepping to the R side/increased unsteadiness. Intermittent UE support throughout.  Airex beam static standing: Static NBOS 30 sec Static WBOS EC x several minutes. PT provides up to min assist. Pt with multiple instances of LOB. Pt utilizes intermittent UE support on bar.    Pt educated throughout session about proper posture and technique with exercises. Improved exercise technique, movement at target joints, use of target muscles after min to mod verbal, visual, tactile cues.    PT Education - 07/25/21 1702     Education Details exercise technique, body mechanics    Person(s) Educated Patient    Methods Explanation;Demonstration;Verbal cues     Comprehension Verbalized understanding;Returned demonstration;Verbal cues required;Need further instruction              PT Short Term Goals - 06/06/21 1201       PT SHORT TERM GOAL #1   Title Patient will be independent in home exercise program to improve balance, and strength/mobility for better functional independence with ADLs and decreased fall risk.    Baseline 03/14/21: to be initiated within next 1-2 sessions 2/6: HEP compliance    Time 6    Period Weeks    Status Achieved    Target Date 06/06/21      PT SHORT TERM GOAL #2   Title Pt will improve gross BLE strength to 5/5 in order to increase ease and safety with mobility and ADLs.    Baseline 03/14/21: strength 4+/5 B, greatest deficit in B ankle DFs 4-/5 B 2/6: grossly 4+/5 with df 4/5 3/13: see note    Time 6    Period Weeks    Status Partially Met    Target Date 04/25/21               PT Long Term Goals - 06/24/21 1136       PT LONG TERM GOAL #1   Title Patient will increase FOTO score to greater than 59 to demonstrate statistically significant improvement in mobility and quality of life.    Baseline 03/14/21: 58 2/6: 57% 3/13: 57.4%; 3/30: 62    Time 12    Period Weeks    Status Achieved    Target Date 06/06/21      PT LONG TERM GOAL #2   Title Patient (> 78 years old) will complete five times sit to stand test in < 10 seconds indicating increased LE strength and decreased fall risk    Baseline 03/14/21: 12 seconds 2/6: 12 seconds 3/13: 13.8 seconds without hands; 3/30: 13 sec; 4/1    Time 12    Period Weeks    Status On-going    Target Date 08/29/21      PT LONG TERM GOAL #3   Title Pt will improve DGI by at least 3 points in order to demonstrate clinically significant improvement in balance and decreased risk for falls.    Baseline 03/14/21: 15/24 2/6: 17/24 3/13: 19/24; 3/30: 21/24    Time 12    Period Weeks    Status Achieved    Target Date 06/06/21      PT LONG TERM GOAL #4   Title Pt will  increase 6MWT by at least 74m(1673f in order to demonstrate clinically significant improvement in cardiopulmonary endurance and community ambulation    Baseline 03/14/21: 795 ft with QC 2/6: 865 ft without AD 3/13: 770  ft with walking stick with knee pain; 3/30: 1056 ft    Time 12    Period Weeks    Status Achieved    Target Date 08/29/21      PT LONG TERM GOAL #5   Title The patient will be able to maintain >5 seconds of SLB on BLEs in order to decrease risk of falls when navigating obstacles, curbs and steps.    Baseline 03/14/21: RLE 4 seconds, LLE 2 seconds 2/6: L 3 seconds R 4 seconds 3/13: L 4 seconds with pain R 15 seconds; 3/30: L 4 sec R 8 sec    Time 12    Period Weeks    Status Partially Met    Target Date 08/29/21      PT LONG TERM GOAL #6   Title Patient will increase 10 meter walk test to at least 1.0 m/s with LRD as to improve gait speed for better community ambulation and to reduce fall risk.    Baseline 03/14/21: 0.67 m/s 2/6: 1.3 m/s    Time 12    Period Weeks    Status Achieved    Target Date 06/06/21                   Plan - 07/25/21 1703     Clinical Impression Statement Pt with excellent motivation to participate in session. He tolerates interventions well without pain. Pt challenged with all compliant surface training and interventions with use of head turns. The pt will benefit from further skilled PT to continue to improve strength, endurance, balance and mobility.    Personal Factors and Comorbidities Age;Past/Current Experience;Comorbidity 3+    Comorbidities A-fib, lumbar disc disease, prostate CA, TIA, gout, hx of pulmonary embolism    Examination-Activity Limitations Carry;Lift;Stand;Locomotion Level;Bend;Dressing;Reach Overhead;Squat;Transfers;Caring for Others;Stairs    Examination-Participation Restrictions Community Activity;Yard Work;Cleaning;Laundry;Shop;Church    Stability/Clinical Decision Making Evolving/Moderate complexity    Rehab  Potential Good    PT Frequency 2x / week    PT Duration 12 weeks    PT Treatment/Interventions ADLs/Self Care Home Management;Biofeedback;Aquatic Therapy;Canalith Repostioning;Cryotherapy;Electrical Stimulation;Iontophoresis 56m/ml Dexamethasone;Moist Heat;Traction;Ultrasound;Gait training;Stair training;DME Instruction;Functional mobility training;Therapeutic activities;Therapeutic exercise;Balance training;Neuromuscular re-education;Patient/family education;Manual techniques;Passive range of motion;Dry needling;Energy conservation;Vestibular;Joint Manipulations;Orthotic Fit/Training;Splinting;Taping;Visual/perceptual remediation/compensation    PT Next Visit Plan Progress balance, gait, cardioresp. endurance, strengthening, continue POC as previously indicated    PT Home Exercise Plan 1/9: Access Code: [email or web address] 4/27: Access Code: [email or web address] no updates    Consulted and Agree with Plan of Care Patient             Patient will benefit from skilled therapeutic intervention in order to improve the following deficits and impairments:  Abnormal gait, Decreased balance, Decreased range of motion, Decreased strength, Hypomobility, Impaired sensation, Pain, Improper body mechanics, Impaired flexibility, Decreased mobility, Decreased activity tolerance, Decreased endurance, Difficulty walking, Increased muscle spasms, Postural dysfunction  Visit Diagnosis: Unsteadiness on feet  Muscle weakness (generalized)  Chronic bilateral low back pain, unspecified whether sciatica present     Problem List Patient Active Problem List   Diagnosis Date Noted   COVID-19 virus infection 02/17/2021   [place] ([place]    TIA (transient ischemic attack)    Lactic acidosis    Hyponatremia    Hypotension    Generalized weakness     HZollie Pee PT 07/25/2021, 5:12 PM  [place] [place] [address]Phone: [phone]  Fax:   [phone] Name: Nicholas SALAYMRN:  [id number] Date of Birth: 1945/09/24

## 2021-07-29 ENCOUNTER — Ambulatory Visit: Payer: Medicare Other

## 2021-07-29 DIAGNOSIS — R2681 Unsteadiness on feet: Secondary | ICD-10-CM | POA: Diagnosis not present

## 2021-07-29 DIAGNOSIS — M6281 Muscle weakness (generalized): Secondary | ICD-10-CM

## 2021-07-29 DIAGNOSIS — R262 Difficulty in walking, not elsewhere classified: Secondary | ICD-10-CM

## 2021-07-29 NOTE — Therapy (Signed)
[place] MAIN [facility] SERVICES [address] [place], [name], [address] Phone: [phone]   Fax:  [phone]  Physical Therapy Treatment/Physical Therapy Progress Note   Dates of reporting period  06/24/2021   to   07/29/2021   Patient Details  Name: Nicholas [name] MRN: [id number] Date of Birth: Aug 23, 1945 No data recorded  Encounter Date: 07/29/2021   PT End of Session - 07/29/21 1116     Visit Number 40    Number of Visits 47    Date for PT Re-Evaluation 08/29/21    Authorization Type 8/10 PN 3/13    Authorization Time Period 03/14/2021-06/06/2021    Progress Note Due on Visit 20    PT Start Time 0938    PT Stop Time 1017    PT Time Calculation (min) 39 min    Equipment Utilized During Treatment Gait belt    Activity Tolerance Patient tolerated treatment well;No increased pain    Behavior During Therapy [facility] for tasks assessed/performed             Past Medical History:  Diagnosis Date   A-fib ([place])    Cancer ([place]) 10/25/2019   Prostate   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   Subjective Assessment - 07/29/21 1114     Subjective Pt reports low back feels OK. He has been walking more. Pt does report increased neuropathy related pain affecting his feet and decreasing his sleep quality. He plans to follow up with his physician regarding this.    Pertinent History Pt is a 76 y/o male, returning to PT following admission to ED for evaluation of weakness s/p fall where pt was found to be  COVID positive. Pt was in the hospital from 02/17/2021-02/23/2021. Pt was then d/c to home where he received [place] PT. Pt reports increased LBP since d/c from hospital and decreased balance since last in outpatient PT. While pt using QC currently, he reports he has been using a RW at home or grabbing onto the wall to steady himself while walking. Pt reports he has been walking home distances. PMH significant for TIA, a-fib, pulmonary embolism, TIA, gout, rhabdomyolysis.    Limitations Standing;Walking;House hold activities;Lifting    How long can you sit comfortably? not limited    How long can you stand comfortably? pt reports limited due to feeling unsteady    How long can you walk comfortably? Pt reports he is currently walking household distances    Patient Stated Goals Pt would like to improve his walking and balance    Currently in Pain? No/denies    Pain Onset More than a month ago               INTERVENTIONS   MMT: grossly 4+/5 bilat   STS - 12 sec (partially met)  SLB (decreased performance compared to 3/30 assessment) 2 seconds RLE 3 seconds LLE  SLB 2x30 sec each LE  One foot on airex, one foot on 6"  step 2x30 sec each LE  Progressed to performing with vertical and horizontal head turns, each LE, and 10x vertical and horizontal  Airex EC NBOS 60 sec    TherEx-  Treadmill mm and cardio endurance - 2% elevation, base level of ambulation 1.8 mph, ambulates in 30 sec bouts to 2.3, 2.5, 2.6, 2.7 mph x 6 minutes 30 seconds total. Seated rest break following intervention due to fatigue.   Heel raises 25x 2 sets bilat   STS 16x    Pt educated throughout session about proper posture and technique with exercises. Improved exercise technique, movement at target joints, use of target muscles after min to mod verbal, visual, tactile cues.     PT Education - 07/29/21 1115     Education Details goals, exercise technique    Person(s) Educated Patient    Methods  Explanation;Demonstration;Verbal cues    Comprehension Verbalized understanding;Returned demonstration;Need further instruction              PT Short Term Goals - 07/29/21 1117       PT SHORT TERM GOAL #1   Title Patient will be independent in home exercise program to improve balance, and strength/mobility for better functional independence with ADLs and decreased fall risk.    Baseline 03/14/21: to be initiated within next 1-2 sessions 2/6: HEP compliance    Time 6    Period Weeks    Status Achieved    Target Date 06/06/21      PT SHORT TERM GOAL #2   Title Pt will improve gross BLE strength to 5/5 in order to increase ease and safety with mobility and ADLs.    Baseline 03/14/21: strength 4+/5 B, greatest deficit in B ankle DFs 4-/5 B 2/6: grossly 4+/5 with df 4/5 3/13: see note; 5/22: grossly 4+/5 bilat, weakness still most prominent in PF    Time 6    Period Weeks    Status Partially Met    Target Date 09/09/21               PT Long Term Goals - 07/29/21 0940       PT LONG TERM GOAL #1   Title Patient will increase FOTO score to greater than 59 to demonstrate statistically significant improvement in mobility and quality of life.    Baseline 03/14/21: 58 2/6: 57% 3/13: 57.4%; 3/30: 62    Time 12    Period Weeks    Status Achieved    Target Date 06/06/21      PT LONG TERM GOAL #2   Title Patient (> 34 years old) will complete five times sit to stand test in < 10 seconds indicating increased LE strength and decreased fall risk    Baseline 03/14/21: 12 seconds 2/6: 12 seconds 3/13: 13.8 seconds without hands; 3/30: 13 sec; 4/1; 5/22: 12 sec hands free    Time 12    Period Weeks    Status Partially Met    Target Date 08/29/21      PT LONG TERM GOAL #3   Title Pt will improve DGI by at least 3 points in order to demonstrate clinically significant improvement in balance and decreased risk for falls.    Baseline 03/14/21: 15/24 2/6: 17/24 3/13: 19/24; 3/30: 21/24    Time 12     Period Weeks    Status Achieved    Target Date 06/06/21      PT LONG TERM GOAL #4   Title Pt will increase 6MWT by at  least 53m(1676f in order to demonstrate clinically significant improvement in cardiopulmonary endurance and community ambulation    Baseline 03/14/21: 795 ft with QC 2/6: 865 ft without AD 3/13: 770 ft with walking stick with knee pain; 3/30: 1056 ft    Time 12    Period Weeks    Status Achieved    Target Date 08/29/21      PT LONG TERM GOAL #5   Title The patient will be able to maintain >5 seconds of SLB on BLEs in order to decrease risk of falls when navigating obstacles, curbs and steps.    Baseline 03/14/21: RLE 4 seconds, LLE 2 seconds 2/6: L 3 seconds R 4 seconds 3/13: L 4 seconds with pain R 15 seconds; 3/30: L 4 sec R 8 sec; 5/22: 2 sec RLE, 3 sec LLE    Time 12    Period Weeks    Status On-going    Target Date 08/29/21      PT LONG TERM GOAL #6   Title Patient will increase 10 meter walk test to at least 1.0 m/s with LRD as to improve gait speed for better community ambulation and to reduce fall risk.    Baseline 03/14/21: 0.67 m/s 2/6: 1.3 m/s    Time 12    Period Weeks    Status Achieved    Target Date 06/06/21                   Plan - 07/29/21 1119     Clinical Impression Statement Goals reassessed for progress note. All remaining goals have been partially met except for SLB goal. This indicates pt has improved BLE strength/power. Future sessions will focus on improving SLB to address remaining defictis. Patient's condition has the potential to improve in response to therapy. Maximum improvement is yet to be obtained. The anticipated improvement is attainable and reasonable in a generally predictable time. The pt will benefit from further skilled PT to improve strength, endurance, balance and mobility.    Personal Factors and Comorbidities Age;Past/Current Experience;Comorbidity 3+    Comorbidities A-fib, lumbar disc disease, prostate CA, TIA,  gout, hx of pulmonary embolism    Examination-Activity Limitations Carry;Lift;Stand;Locomotion Level;Bend;Dressing;Reach Overhead;Squat;Transfers;Caring for Others;Stairs    Examination-Participation Restrictions Community Activity;Yard Work;Cleaning;Laundry;Shop;Church    Stability/Clinical Decision Making Evolving/Moderate complexity    Rehab Potential Good    PT Frequency 2x / week    PT Duration 12 weeks    PT Treatment/Interventions ADLs/Self Care Home Management;Biofeedback;Aquatic Therapy;Canalith Repostioning;Cryotherapy;Electrical Stimulation;Iontophoresis 38m99ml Dexamethasone;Moist Heat;Traction;Ultrasound;Gait training;Stair training;DME Instruction;Functional mobility training;Therapeutic activities;Therapeutic exercise;Balance training;Neuromuscular re-education;Patient/family education;Manual techniques;Passive range of motion;Dry needling;Energy conservation;Vestibular;Joint Manipulations;Orthotic Fit/Training;Splinting;Taping;Visual/perceptual remediation/compensation    PT Next Visit Plan Progress balance, gait, cardioresp. endurance, strengthening, continue POC as previously indicated    PT Home Exercise Plan 1/9: Access Code: [email or web address] Access Code: [email or web address] updates    Consulted and Agree with Plan of Care Patient             Patient will benefit from skilled therapeutic intervention in order to improve the following deficits and impairments:  Abnormal gait, Decreased balance, Decreased range of motion, Decreased strength, Hypomobility, Impaired sensation, Pain, Improper body mechanics, Impaired flexibility, Decreased mobility, Decreased activity tolerance, Decreased endurance, Difficulty walking, Increased muscle spasms, Postural dysfunction  Visit Diagnosis: Unsteadiness on feet  Muscle weakness (generalized)  Difficulty in walking, not elsewhere classified     Problem List Patient Active Problem List   Diagnosis Date Noted   COVID-19 virus infection  02/17/2021  A-fib ([place])    TIA (transient ischemic attack)    Lactic acidosis    Hyponatremia    Hypotension    Generalized weakness     Zollie Pee, PT 07/29/2021, 11:23 AM  [place] MAIN [facility] SERVICES [address] [place], [name], [address] Phone: [phone]   Fax:  [phone]  Name: RONELL DUFFUS MRN: [id number] Date of Birth: December 27, 1945

## 2021-08-01 ENCOUNTER — Ambulatory Visit: Payer: Medicare Other

## 2021-08-01 DIAGNOSIS — R2681 Unsteadiness on feet: Secondary | ICD-10-CM

## 2021-08-01 DIAGNOSIS — R262 Difficulty in walking, not elsewhere classified: Secondary | ICD-10-CM

## 2021-08-01 DIAGNOSIS — G8929 Other chronic pain: Secondary | ICD-10-CM

## 2021-08-01 DIAGNOSIS — M6281 Muscle weakness (generalized): Secondary | ICD-10-CM

## 2021-08-01 NOTE — Therapy (Signed)
[place] MAIN [facility] SERVICES [address] [place], [name], [address] Phone: [phone]   Fax:  [phone]  Physical Therapy Treatment  Patient Details  Name: Nicholas [name] MRN: [id number] Date of Birth: 17-Dec-1945 No data recorded  Encounter Date: 08/01/2021   PT End of Session - 08/01/21 1055     Visit Number 41    Number of Visits 51    Date for PT Re-Evaluation 08/29/21    Authorization Type 8/10 PN 3/13    Authorization Time Period 03/14/2021-06/06/2021    Progress Note Due on Visit 20    PT Start Time 1100    PT Stop Time 1144    PT Time Calculation (min) 44 min    Equipment Utilized During Treatment Gait belt    Activity Tolerance Patient tolerated treatment well;No increased pain    Behavior During Therapy [facility] for tasks assessed/performed             Past Medical History:  Diagnosis Date   A-fib ([place])    Cancer ([place]) 10/25/2019   Prostate   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   Subjective Assessment - 08/01/21 1056     Subjective Pt reports his back has felt increasingly stiff over the past few days. He has been walking more. Pt reports he had two migraines yesterday. Reports this is unusual for him. Pt reports sleep has been good lately.    Pertinent History Pt is a 76 y/o male, returning to PT following admission to ED for evaluation of weakness s/p fall where pt was found to be COVID positive. Pt was in the hospital from 02/17/2021-02/23/2021. Pt was then d/c to home  where he received [place] PT. Pt reports increased LBP since d/c from hospital and decreased balance since last in outpatient PT. While pt using QC currently, he reports he has been using a RW at home or grabbing onto the wall to steady himself while walking. Pt reports he has been walking home distances. PMH significant for TIA, a-fib, pulmonary embolism, TIA, gout, rhabdomyolysis.    Limitations Standing;Walking;House hold activities;Lifting    How long can you sit comfortably? not limited    How long can you stand comfortably? pt reports limited due to feeling unsteady    How long can you walk comfortably? Pt reports he is currently walking household distances    Patient Stated Goals Pt would like to improve his walking and balance    Currently in Pain? No/denies    Pain Onset More than a month ago            INTERVENTIONS   NMR: One foot on airex, one foot on 6" step 2x30 sec each LE             Progressed to performing with horizontal head turns 10 bilat, each LE  Alternating LE cone taps x multiple reps each LE: 2 and 3 cones to increase challenge  SLB with intermittent UE/toe-touch support  3x30 sec each LE   Airex EC NBOS 3x30-40 sec; Pt rates as difficult  High knee march on airex in place to promote SLB x multiple reps each LE     TherEx:   Seated p.ball rollouts forward/backward and diagonally x 3 minutes  Seated BP: taken as pt reported recent headaches, no current symptoms LUE: 111/74 mmHg HR 80 bpm  Prone on plinth - heat donned to low back  Prone press-up 3x30 sec holds Prone press-up 2x6 Following manual therapy (below) Prone press-ups 6x. Reports improved ease of movement.  Manual: CPA grade I-II mobs L1-2 (TTP), T6-8 (hypomobile, T6 TTP) x multiple 20-30 sec bouts per level. Pain improves with mobilizations.    Pt educated throughout session about proper posture and technique with exercises. Improved exercise technique, movement at target joints, use of target  muscles after min to mod verbal, visual, tactile cues.  Pt reports improved back symptoms at end of session.   PT Education - 08/01/21 1545     Education Details purpose of mobilizations, exercise technique    Person(s) Educated Patient    Methods Explanation;Demonstration;Tactile cues;Verbal cues    Comprehension Verbalized understanding;Need further instruction;Verbal cues required;Returned demonstration              PT Short Term Goals - 07/29/21 1117       PT SHORT TERM GOAL #1   Title Patient will be independent in home exercise program to improve balance, and strength/mobility for better functional independence with ADLs and decreased fall risk.    Baseline 03/14/21: to be initiated within next 1-2 sessions 2/6: HEP compliance    Time 6    Period Weeks    Status Achieved    Target Date 06/06/21      PT SHORT TERM GOAL #2   Title Pt will improve gross BLE strength to 5/5 in order to increase ease and safety with mobility and ADLs.    Baseline 03/14/21: strength 4+/5 B, greatest deficit in B ankle DFs 4-/5 B 2/6: grossly 4+/5 with df 4/5 3/13: see note; 5/22: grossly 4+/5 bilat, weakness still most prominent in PF    Time 6    Period Weeks    Status Partially Met    Target Date 09/09/21               PT Long Term Goals - 07/29/21 0940       PT LONG TERM GOAL #1   Title Patient will increase FOTO score to greater than 59 to demonstrate statistically significant improvement in mobility and quality of life.    Baseline 03/14/21: 58 2/6: 57% 3/13: 57.4%; 3/30: 62    Time 12    Period Weeks    Status Achieved    Target Date 06/06/21      PT LONG TERM GOAL #2   Title Patient (> 2 years old) will complete five times sit to stand test in < 10 seconds indicating increased LE strength and decreased fall risk    Baseline 03/14/21: 12 seconds 2/6: 12 seconds 3/13: 13.8 seconds without hands; 3/30: 13 sec; 4/1; 5/22: 12 sec hands free    Time 12    Period Weeks    Status  Partially Met    Target Date 08/29/21      PT LONG TERM GOAL #3   Title Pt will improve DGI by at least 3 points in order to demonstrate clinically significant improvement in balance and decreased risk for falls.    Baseline  03/14/21: 15/24 2/6: 17/24 3/13: 19/24; 3/30: 21/24    Time 12    Period Weeks    Status Achieved    Target Date 06/06/21      PT LONG TERM GOAL #4   Title Pt will increase 6MWT by at least 238m(1649f in order to demonstrate clinically significant improvement in cardiopulmonary endurance and community ambulation    Baseline 03/14/21: 795 ft with QC 2/6: 865 ft without AD 3/13: 770 ft with walking stick with knee pain; 3/30: 1056 ft    Time 12    Period Weeks    Status Achieved    Target Date 08/29/21      PT LONG TERM GOAL #5   Title The patient will be able to maintain >5 seconds of SLB on BLEs in order to decrease risk of falls when navigating obstacles, curbs and steps.    Baseline 03/14/21: RLE 4 seconds, LLE 2 seconds 2/6: L 3 seconds R 4 seconds 3/13: L 4 seconds with pain R 15 seconds; 3/30: L 4 sec R 8 sec; 5/22: 2 sec RLE, 3 sec LLE    Time 12    Period Weeks    Status On-going    Target Date 08/29/21      PT LONG TERM GOAL #6   Title Patient will increase 10 meter walk test to at least 1.0 m/s with LRD as to improve gait speed for better community ambulation and to reduce fall risk.    Baseline 03/14/21: 0.67 m/s 2/6: 1.3 m/s    Time 12    Period Weeks    Status Achieved    Target Date 06/06/21                   Plan - 08/01/21 1547     Clinical Impression Statement Focus of session on SLB and SLB progressions. Pt was challenged with all SLB interventions but did exhibit within session improvement by maintaining single leg stance for 4-5 sec each LE. Pt with reports of increased stiffness in his back today. PT provided CPA grade I-II mobiliazations, after which pt reported improvement in pain. The pt will benefit from further skilled PT to  improve strength, endurance, balance and mobility.    Personal Factors and Comorbidities Age;Past/Current Experience;Comorbidity 3+    Comorbidities A-fib, lumbar disc disease, prostate CA, TIA, gout, hx of pulmonary embolism    Examination-Activity Limitations Carry;Lift;Stand;Locomotion Level;Bend;Dressing;Reach Overhead;Squat;Transfers;Caring for Others;Stairs    Examination-Participation Restrictions Community Activity;Yard Work;Cleaning;Laundry;Shop;Church    Stability/Clinical Decision Making Evolving/Moderate complexity    Rehab Potential Good    PT Frequency 2x / week    PT Duration 12 weeks    PT Treatment/Interventions ADLs/Self Care Home Management;Biofeedback;Aquatic Therapy;Canalith Repostioning;Cryotherapy;Electrical Stimulation;Iontophoresis 38m65ml Dexamethasone;Moist Heat;Traction;Ultrasound;Gait training;Stair training;DME Instruction;Functional mobility training;Therapeutic activities;Therapeutic exercise;Balance training;Neuromuscular re-education;Patient/family education;Manual techniques;Passive range of motion;Dry needling;Energy conservation;Vestibular;Joint Manipulations;Orthotic Fit/Training;Splinting;Taping;Visual/perceptual remediation/compensation    PT Next Visit Plan Progress balance, gait, cardioresp. endurance, strengthening, continue POC as previously indicated    PT Home Exercise Plan 1/9: Access Code: [email or web address] Access Code: [email or web address]    Consulted and Agree with Plan of Care Patient             Patient will benefit from skilled therapeutic intervention in order to improve the following deficits and impairments:  Abnormal gait, Decreased balance, Decreased range of motion, Decreased strength, Hypomobility, Impaired sensation, Pain, Improper body mechanics, Impaired flexibility, Decreased mobility, Decreased activity tolerance, Decreased endurance, Difficulty walking, Increased muscle spasms, Postural dysfunction  Visit  Diagnosis: Unsteadiness  on feet  Muscle weakness (generalized)  Chronic bilateral low back pain, unspecified whether sciatica present  Difficulty in walking, not elsewhere classified     Problem List Patient Active Problem List   Diagnosis Date Noted   COVID-19 virus infection 02/17/2021   A-fib ([place])    TIA (transient ischemic attack)    Lactic acidosis    Hyponatremia    Hypotension    Generalized weakness     Zollie Pee, PT 08/01/2021, 4:00 PM  [place] MAIN [facility] SERVICES [address] [place], [name], [address] Phone: [phone]   Fax:  [phone]  Name: JOSEGUADALUPE STAN MRN: [id number] Date of Birth: Oct 28, 1945

## 2021-08-08 ENCOUNTER — Ambulatory Visit: Payer: Medicare Other | Attending: Internal Medicine

## 2021-08-08 DIAGNOSIS — R262 Difficulty in walking, not elsewhere classified: Secondary | ICD-10-CM | POA: Insufficient documentation

## 2021-08-08 DIAGNOSIS — R278 Other lack of coordination: Secondary | ICD-10-CM | POA: Insufficient documentation

## 2021-08-08 DIAGNOSIS — M6281 Muscle weakness (generalized): Secondary | ICD-10-CM | POA: Diagnosis present

## 2021-08-08 DIAGNOSIS — R2681 Unsteadiness on feet: Secondary | ICD-10-CM | POA: Insufficient documentation

## 2021-08-08 DIAGNOSIS — G8929 Other chronic pain: Secondary | ICD-10-CM | POA: Diagnosis present

## 2021-08-08 DIAGNOSIS — M545 Low back pain, unspecified: Secondary | ICD-10-CM | POA: Diagnosis present

## 2021-08-08 NOTE — Therapy (Signed)
[place] MAIN [facility] SERVICES [address] [place], [name], [address] Phone: [phone]   Fax:  [phone]  Physical Therapy Treatment  Patient Details  Name: [name] MRN: [id number] Date of Birth: May 22, 1945 No data recorded  Encounter Date: 08/08/2021   PT End of Session - 08/08/21 1543     Visit Number 42    Number of Visits 55    Date for PT Re-Evaluation 08/29/21    Authorization Type 8/10 PN 3/13    Authorization Time Period 03/14/2021-06/06/2021    Progress Note Due on Visit 20    PT Start Time 1101    PT Stop Time 1145    PT Time Calculation (min) 44 min    Equipment Utilized During Treatment Gait belt    Activity Tolerance Patient tolerated treatment well;No increased pain    Behavior During Therapy [facility] for tasks assessed/performed             Past Medical History:  Diagnosis Date   A-fib ([place])    Cancer ([place]) 10/25/2019   Prostate   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   Subjective Assessment - 08/08/21 1103     Subjective Pt has been walking 30+ minutes/day. Pt reports no back pain but back stiffness. Had a good weekend with grandchildren visiting.  Pt has continued to have foot pain at night.    Pertinent History Pt is a 76 y/o male, returning to PT following admission to ED for evaluation of weakness s/p fall where pt was found to be COVID positive. Pt was in the hospital from 02/17/2021-02/23/2021. Pt was then d/c to home where he received [facility]. Pt reports  increased LBP since d/c from hospital and decreased balance since last in outpatient PT. While pt using QC currently, he reports he has been using a RW at home or grabbing onto the wall to steady himself while walking. Pt reports he has been walking home distances. PMH significant for TIA, a-fib, pulmonary embolism, TIA, gout, rhabdomyolysis.    Limitations Standing;Walking;House hold activities;Lifting    How long can you sit comfortably? not limited    How long can you stand comfortably? pt reports limited due to feeling unsteady    How long can you walk comfortably? Pt reports he is currently walking household distances    Patient Stated Goals Pt would like to improve his walking and balance    Currently in Pain? No/denies    Pain Onset More than a month ago              INTERVENTIONS - gait belt donned and CGA provided throughout unless otherwise noted   TherEx:   Treadmill endurance training HIIT - 7 min 30 sec: Base speed 1.8 mph, elevation 1-2% Increased speed in 30 sec intervals from 2-2.6 mph  STS: 2x16  Heel raises 26x bilat. Challenging  NMR: FWD and LTL lunge 8x for each LE onto bosu (round side up) for each condition,  with intermittent UE support.  SLB progression - one foot on floor, one on half-bosu (round side up) 2x30 sec each LE   Progressed to performing with basketball shot 1 round in each LE position with multiple reps per round. Very challenging for pt.  Pt reports improvement in back stiffness sensation by end of session.   Pt educated throughout session about proper posture and technique with exercises. Improved exercise technique, movement at target joints, use of target muscles after min to mod verbal, visual, tactile cues.  Rationale for Evaluation and Treatment Rehabilitation     PT Education - 08/08/21 1542     Education Details exercise technique    Person(s) Educated Patient    Methods Explanation;Demonstration;Verbal cues     Comprehension Returned demonstration;Verbalized understanding;Verbal cues required;Need further instruction              PT Short Term Goals - 07/29/21 1117       PT SHORT TERM GOAL #1   Title Patient will be independent in home exercise program to improve balance, and strength/mobility for better functional independence with ADLs and decreased fall risk.    Baseline 03/14/21: to be initiated within next 1-2 sessions 2/6: HEP compliance    Time 6    Period Weeks    Status Achieved    Target Date 06/06/21      PT SHORT TERM GOAL #2   Title Pt will improve gross BLE strength to 5/5 in order to increase ease and safety with mobility and ADLs.    Baseline 03/14/21: strength 4+/5 B, greatest deficit in B ankle DFs 4-/5 B 2/6: grossly 4+/5 with df 4/5 3/13: see note; 5/22: grossly 4+/5 bilat, weakness still most prominent in PF    Time 6    Period Weeks    Status Partially Met    Target Date 09/09/21               PT Long Term Goals - 07/29/21 0940       PT LONG TERM GOAL #1   Title Patient will increase FOTO score to greater than 59 to demonstrate statistically significant improvement in mobility and quality of life.    Baseline 03/14/21: 58 2/6: 57% 3/13: 57.4%; 3/30: 62    Time 12    Period Weeks    Status Achieved    Target Date 06/06/21      PT LONG TERM GOAL #2   Title Patient (> 57 years old) will complete five times sit to stand test in < 10 seconds indicating increased LE strength and decreased fall risk    Baseline 03/14/21: 12 seconds 2/6: 12 seconds 3/13: 13.8 seconds without hands; 3/30: 13 sec; 4/1; 5/22: 12 sec hands free    Time 12    Period Weeks    Status Partially Met    Target Date 08/29/21      PT LONG TERM GOAL #3   Title Pt will improve DGI by at least 3 points in order to demonstrate clinically significant improvement in balance and decreased risk for falls.    Baseline 03/14/21: 15/24 2/6: 17/24 3/13: 19/24; 3/30: 21/24    Time 12    Period Weeks     Status Achieved    Target Date 06/06/21      PT LONG TERM GOAL #4   Title Pt will increase 6MWT by at least 61m(1617f in order to demonstrate clinically significant improvement in cardiopulmonary endurance and community ambulation    Baseline  03/14/21: 795 ft with QC 2/6: 865 ft without AD 3/13: 770 ft with walking stick with knee pain; 3/30: 1056 ft    Time 12    Period Weeks    Status Achieved    Target Date 08/29/21      PT LONG TERM GOAL #5   Title The patient will be able to maintain >5 seconds of SLB on BLEs in order to decrease risk of falls when navigating obstacles, curbs and steps.    Baseline 03/14/21: RLE 4 seconds, LLE 2 seconds 2/6: L 3 seconds R 4 seconds 3/13: L 4 seconds with pain R 15 seconds; 3/30: L 4 sec R 8 sec; 5/22: 2 sec RLE, 3 sec LLE    Time 12    Period Weeks    Status On-going    Target Date 08/29/21      PT LONG TERM GOAL #6   Title Patient will increase 10 meter walk test to at least 1.0 m/s with LRD as to improve gait speed for better community ambulation and to reduce fall risk.    Baseline 03/14/21: 0.67 m/s 2/6: 1.3 m/s    Time 12    Period Weeks    Status Achieved    Target Date 06/06/21                   Plan - 08/08/21 1543     Clinical Impression Statement Pt shows progress with no LBP currently or recently outside of PT, however, does endorse stiffness of his back. This sensation improved following interventions today. Pt tolerates treadmill, high volume STS and heel raise exercise without pain. Pt still challenged with SLB progressions, but was able to perform balance interventions with no greater than CGA. The pt will benefit from further skilled PT to improve strength, balance and mobility.    Personal Factors and Comorbidities Age;Past/Current Experience;Comorbidity 3+    Comorbidities A-fib, lumbar disc disease, prostate CA, TIA, gout, hx of pulmonary embolism    Examination-Activity Limitations Carry;Lift;Stand;Locomotion  Level;Bend;Dressing;Reach Overhead;Squat;Transfers;Caring for Others;Stairs    Examination-Participation Restrictions Community Activity;Yard Work;Cleaning;Laundry;Shop;Church    Stability/Clinical Decision Making Evolving/Moderate complexity    Rehab Potential Good    PT Frequency 2x / week    PT Duration 12 weeks    PT Treatment/Interventions ADLs/Self Care Home Management;Biofeedback;Aquatic Therapy;Canalith Repostioning;Cryotherapy;Electrical Stimulation;Iontophoresis 73m/ml Dexamethasone;Moist Heat;Traction;Ultrasound;Gait training;Stair training;DME Instruction;Functional mobility training;Therapeutic activities;Therapeutic exercise;Balance training;Neuromuscular re-education;Patient/family education;Manual techniques;Passive range of motion;Dry needling;Energy conservation;Vestibular;Joint Manipulations;Orthotic Fit/Training;Splinting;Taping;Visual/perceptual remediation/compensation    PT Next Visit Plan Progress balance, gait, cardioresp. endurance, strengthening, continue POC as previously indicated    PT Home Exercise Plan 1/9: Access Code: [email or web address]/27: Access Code: [email or web address] no updates    Consulted and Agree with Plan of Care Patient             Patient will benefit from skilled therapeutic intervention in order to improve the following deficits and impairments:  Abnormal gait, Decreased balance, Decreased range of motion, Decreased strength, Hypomobility, Impaired sensation, Pain, Improper body mechanics, Impaired flexibility, Decreased mobility, Decreased activity tolerance, Decreased endurance, Difficulty walking, Increased muscle spasms, Postural dysfunction  Visit Diagnosis: Unsteadiness on feet  Muscle weakness (generalized)  Difficulty in walking, not elsewhere classified     Problem List Patient Active Problem List   Diagnosis Date Noted   COVID-19 virus infection 02/17/2021   A-fib ([place]    TIA (transient ischemic attack)    Lactic acidosis    Hyponatremia     Hypotension    Generalized weakness  Zollie Pee, PT 08/08/2021, 3:52 PM  [place] MAIN [facility] SERVICES [address] [place], [name], [address] Phone: [phone]   Fax:  [phone]  Name: APOSTOLOS BLAGG MRN: [id number] Date of Birth: Dec 25, 1945

## 2021-08-12 ENCOUNTER — Ambulatory Visit: Payer: Medicare Other

## 2021-08-12 DIAGNOSIS — M6281 Muscle weakness (generalized): Secondary | ICD-10-CM

## 2021-08-12 DIAGNOSIS — R262 Difficulty in walking, not elsewhere classified: Secondary | ICD-10-CM

## 2021-08-12 DIAGNOSIS — R2681 Unsteadiness on feet: Secondary | ICD-10-CM

## 2021-08-12 NOTE — Therapy (Signed)
[place] MAIN [facility] SERVICES [address] [place], [name], [address] Phone: [phone]   Fax:  [phone]  Physical Therapy Treatment  Patient Details  Name: Nicholas [name] MRN: [id number] Date of Birth: 02-Sep-1945 No data recorded  Encounter Date: 08/12/2021   PT End of Session - 08/12/21 1156     Visit Number 43    Number of Visits 64    Date for PT Re-Evaluation 08/29/21    Authorization Type 8/10 PN 3/13    Authorization Time Period 03/14/2021-06/06/2021    Progress Note Due on Visit 20    PT Start Time 1018    PT Stop Time 1100    PT Time Calculation (min) 42 min    Equipment Utilized During Treatment Gait belt    Activity Tolerance Patient tolerated treatment well;No increased pain;Patient limited by fatigue    Behavior During Therapy [name] for tasks assessed/performed             Past Medical History:  Diagnosis Date   A-fib ([place])    Cancer ([place]) 10/25/2019   Prostate   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   Subjective Assessment - 08/12/21 1151     Subjective Pt reports that he did "lots of walking" recently and is sore as a result.    Pertinent History Pt is a 76 y/o male, returning to PT following admission to ED for evaluation of weakness s/p fall where pt was found to be COVID positive. Pt was in the [name] from 02/17/2021-02/23/2021. Pt was then d/c to home where he received [facility]. Pt reports increased LBP since d/c from [name] and decreased balance since last in  outpatient PT. While pt using QC currently, he reports he has been using a RW at home or grabbing onto the wall to steady himself while walking. Pt reports he has been walking home distances. PMH significant for TIA, a-fib, pulmonary embolism, TIA, gout, rhabdomyolysis.    Limitations Standing;Walking;House hold activities;Lifting    How long can you sit comfortably? not limited    How long can you stand comfortably? pt reports limited due to feeling unsteady    How long can you walk comfortably? Pt reports he is currently walking household distances    Patient Stated Goals Pt would like to improve his walking and balance    Currently in Pain? Yes    Pain Location Back    Pain Onset More than a month ago             INTERVENTIONS - gait belt donned and CGA provided throughout unless otherwise noted   TherEx:   STS warm-up: 15x without BUE support, pt rates as medium STS: 2x11 with intermittent UE support for minor LOB  Resisted walking in Matrix:  FWD/BWD - 8x at 22.5# LTL - 5x at 22.5# on L, 1x at 17.5# on R (stopped due to RLE fatigue/discomfort)  Seated: LAQ: 2x12 B, 5# ankle weights donned each LE  NMR:  Walking in // bars: Tandem FWD/Regular BWD - 5x each with intermittent UE support length of bars  Cones taps with varying color cues: B with multiple reps per round. Intermittent UE support required  NBOS on Airex: EO - 60 sec with head turns - 10x vertical head turns, 10x horizontal head turns EC - 60 sec   Pt educated throughout session about proper posture and technique with exercises. Improved exercise technique, movement at target joints, use of target muscles after min to mod verbal, visual, tactile cues.  Rationale for Evaluation and Treatment Rehabilitation  Izola Price, SPT  This entire session was performed under direct supervision and direction of a licensed therapist . I have personally read, edited and approve of the note as written. Ricard Dillon  PT, DPT      PT Education - 08/12/21 1156     Education Details exercies techniques, body mechanics    Person(s) Educated Patient    Methods Explanation;Demonstration;Verbal cues    Comprehension Verbalized understanding;Returned demonstration              PT Short Term Goals - 07/29/21 1117       PT SHORT TERM GOAL #1   Title Patient will be independent in home exercise program to improve balance, and strength/mobility for better functional independence with ADLs and decreased fall risk.    Baseline 03/14/21: to be initiated within next 1-2 sessions 2/6: HEP compliance    Time 6    Period Weeks    Status Achieved    Target Date 06/06/21      PT SHORT TERM GOAL #2   Title Pt will improve gross BLE strength to 5/5 in order to increase ease and safety with mobility and ADLs.    Baseline 03/14/21: strength 4+/5 B, greatest deficit in B ankle DFs 4-/5 B 2/6: grossly 4+/5 with df 4/5 3/13: see note; 5/22: grossly 4+/5 bilat, weakness still most prominent in PF    Time 6    Period Weeks    Status Partially Met    Target Date 09/09/21               PT Long Term Goals - 07/29/21 0940       PT LONG TERM GOAL #1   Title Patient will increase FOTO score to greater than 59 to demonstrate statistically significant improvement in mobility and quality of life.    Baseline 03/14/21: 58 2/6: 57% 3/13: 57.4%; 3/30: 62    Time 12    Period Weeks    Status Achieved    Target Date 06/06/21      PT LONG TERM GOAL #2   Title Patient (> 76 years old) will complete five times sit to stand test in < 10 seconds indicating increased LE strength and decreased fall risk    Baseline 03/14/21: 12 seconds 2/6: 12 seconds 3/13: 13.8 seconds without hands; 3/30: 13 sec; 4/1; 5/22: 12 sec hands free    Time 12    Period Weeks    Status Partially Met    Target Date 08/29/21      PT LONG TERM GOAL #3   Title Pt will improve DGI by at least 3 points in order to demonstrate clinically significant  improvement in balance and decreased risk for falls.    Baseline 03/14/21: 15/24 2/6: 17/24 3/13: 19/24; 3/30: 21/24    Time 12    Period Weeks    Status Achieved    Target Date 06/06/21  PT LONG TERM GOAL #4   Title Pt will increase 6MWT by at least 12m(1657f in order to demonstrate clinically significant improvement in cardiopulmonary endurance and community ambulation    Baseline 03/14/21: 795 ft with QC 2/6: 865 ft without AD 3/13: 770 ft with walking stick with knee pain; 3/30: 1056 ft    Time 12    Period Weeks    Status Achieved    Target Date 08/29/21      PT LONG TERM GOAL #5   Title The patient will be able to maintain >5 seconds of SLB on BLEs in order to decrease risk of falls when navigating obstacles, curbs and steps.    Baseline 03/14/21: RLE 4 seconds, LLE 2 seconds 2/6: L 3 seconds R 4 seconds 3/13: L 4 seconds with pain R 15 seconds; 3/30: L 4 sec R 8 sec; 5/22: 2 sec RLE, 3 sec LLE    Time 12    Period Weeks    Status On-going    Target Date 08/29/21      PT LONG TERM GOAL #6   Title Patient will increase 10 meter walk test to at least 1.0 m/s with LRD as to improve gait speed for better community ambulation and to reduce fall risk.    Baseline 03/14/21: 0.67 m/s 2/6: 1.3 m/s    Time 12    Period Weeks    Status Achieved    Target Date 06/06/21                   Plan - 08/12/21 1200     Clinical Impression Statement Pt shows progress with treatment session today. Pt tolerates STS with no increase in pain. During resisted walking in Matrix, pt limited by reports of fatigue and discomfort in RLE with lateral walking to R. Pt still challenged by SLB progressions, but was able to perform with intermittent UE support and no greater than CGA. The pt will benefit from further skilled PT to improve strength, balance and mobility.    Personal Factors and Comorbidities Age;Past/Current Experience;Comorbidity 3+    Comorbidities A-fib, lumbar disc disease, prostate  CA, TIA, gout, hx of pulmonary embolism    Examination-Activity Limitations Carry;Lift;Stand;Locomotion Level;Bend;Dressing;Reach Overhead;Squat;Transfers;Caring for Others;Stairs    Examination-Participation Restrictions Community Activity;Yard Work;Cleaning;Laundry;Shop;Church    Stability/Clinical Decision Making Evolving/Moderate complexity    Rehab Potential Good    PT Frequency 2x / week    PT Duration 12 weeks    PT Treatment/Interventions ADLs/Self Care Home Management;Biofeedback;Aquatic Therapy;Canalith Repostioning;Cryotherapy;Electrical Stimulation;Iontophoresis 29m48ml Dexamethasone;Moist Heat;Traction;Ultrasound;Gait training;Stair training;DME Instruction;Functional mobility training;Therapeutic activities;Therapeutic exercise;Balance training;Neuromuscular re-education;Patient/family education;Manual techniques;Passive range of motion;Dry needling;Energy conservation;Vestibular;Joint Manipulations;Orthotic Fit/Training;Splinting;Taping;Visual/perceptual remediation/compensation    PT Next Visit Plan Progress balance, gait, cardioresp. endurance, strengthening, continue POC as previously indicated    PT Home Exercise Plan 1/9: Access Code: [email or web address] Access Code: [email or web address] updates    Consulted and Agree with Plan of Care Patient             Patient will benefit from skilled therapeutic intervention in order to improve the following deficits and impairments:  Abnormal gait, Decreased balance, Decreased range of motion, Decreased strength, Hypomobility, Impaired sensation, Pain, Improper body mechanics, Impaired flexibility, Decreased mobility, Decreased activity tolerance, Decreased endurance, Difficulty walking, Increased muscle spasms, Postural dysfunction  Visit Diagnosis: Unsteadiness on feet  Muscle weakness (generalized)  Difficulty in walking, not elsewhere classified     Problem List Patient Active Problem List   Diagnosis Date Noted   COVID-19 virus  infection 02/17/2021   A-fib ([place])    TIA (transient ischemic attack)    Lactic acidosis    Hyponatremia    Hypotension    Generalized weakness     Zollie Pee, PT 08/12/2021, 1:06 PM  [place] MAIN [facility] SERVICES [address] [place], [name], [address] Phone: [phone]   Fax:  [phone]  Name: REYNOLDS KITTEL MRN: [id number] Date of Birth: 1945/05/27

## 2021-08-12 NOTE — Therapy (Deleted)
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   INTERVENTIONS - gait belt donned and CGA provided throughout unless otherwise noted   TherEx:   STS warm-up: 15x without BUE support, pt rates as medium STS: 2x11 with intermittent UE support for minor LOB  Resisted walking in Matrix:  FWD/BWD - 8x at 22.5# LTL - 5x at 22.5# on L, 1x at 17.5# on R  Seated: LAQ: 2x12 B, 5#  NMR:  Walking in // bars: Tandem FWD/Regular BWD - 5x with intermittent UE support  Cones taps with varying color cues: B with multiple reps per round  NBOS on Airex: EO - 60 sec with head turns - 30 sec each direction, very challenging for pt EC - 60 sec                               PT Short Term Goals - 07/29/21 1117       PT SHORT TERM GOAL #1   Title Patient will be independent in home exercise program to improve balance, and strength/mobility for better functional independence with ADLs and decreased fall risk.    Baseline 03/14/21: to be initiated within next 1-2 sessions 2/6: HEP compliance    Time 6    Period Weeks    Status  Achieved    Target Date 06/06/21      PT SHORT TERM GOAL #2   Title Pt will improve gross BLE strength to 5/5 in order to increase ease and safety with mobility and ADLs.    Baseline 03/14/21: strength 4+/5 B, greatest deficit in B ankle DFs 4-/5 B 2/6: grossly 4+/5 with df 4/5 3/13: see note; 5/22: grossly 4+/5 bilat, weakness still most prominent in PF    Time 6    Period Weeks    Status Partially Met    Target Date 09/09/21               PT Long Term Goals - 07/29/21 0940       PT LONG TERM GOAL #1   Title Patient will increase FOTO score to greater than 59 to demonstrate statistically significant improvement in mobility and quality of life.    Baseline 03/14/21: 58 2/6: 57% 3/13: 57.4%; 3/30: 62    Time 12    Period Weeks    Status Achieved    Target Date 06/06/21      PT LONG TERM GOAL #2   Title Patient (> 84 years old) will complete five times sit to stand test  in < 10 seconds indicating increased LE strength and decreased fall risk    Baseline 03/14/21: 12 seconds 2/6: 12 seconds 3/13: 13.8 seconds without hands; 3/30: 13 sec; 4/1; 5/22: 12 sec hands free    Time 12    Period Weeks    Status Partially Met    Target Date 08/29/21      PT LONG TERM GOAL #3   Title Pt will improve DGI by at least 3 points in order to demonstrate clinically significant improvement in balance and decreased risk for falls.    Baseline 03/14/21: 15/24 2/6: 17/24 3/13: 19/24; 3/30: 21/24    Time 12    Period Weeks    Status Achieved    Target Date 06/06/21      PT LONG TERM GOAL #4   Title Pt will increase 6MWT by at least 56m(1690f in order to demonstrate clinically significant improvement in cardiopulmonary endurance and community ambulation    Baseline 03/14/21: 795 ft with QC 2/6: 865 ft without AD 3/13: 770 ft with walking stick with knee pain; 3/30: 1056 ft    Time 12    Period Weeks    Status Achieved    Target Date 08/29/21      PT LONG TERM GOAL #5   Title The patient will be  able to maintain >5 seconds of SLB on BLEs in order to decrease risk of falls when navigating obstacles, curbs and steps.    Baseline 03/14/21: RLE 4 seconds, LLE 2 seconds 2/6: L 3 seconds R 4 seconds 3/13: L 4 seconds with pain R 15 seconds; 3/30: L 4 sec R 8 sec; 5/22: 2 sec RLE, 3 sec LLE    Time 12    Period Weeks    Status On-going    Target Date 08/29/21      PT LONG TERM GOAL #6   Title Patient will increase 10 meter walk test to at least 1.0 m/s with LRD as to improve gait speed for better community ambulation and to reduce fall risk.    Baseline 03/14/21: 0.67 m/s 2/6: 1.3 m/s    Time 12    Period Weeks    Status Achieved    Target Date 06/06/21                    Patient will benefit from skilled therapeutic intervention in order to improve the following deficits and impairments:     Visit Diagnosis: No diagnosis found.     Problem List Patient Active Problem List   Diagnosis Date Noted   COVID-19 virus infection 02/17/2021   A-fib ([facility]   TIA (transient ischemic attack)    Lactic acidosis    Hyponatremia    Hypotension    Generalized weakness     NiIzola PriceStudent-PT 08/12/2021, 10:16 AM  [place] [facility] [address]hone: [phone] Fax:  [phone]: Nicholas BRINKMEIERRN: [id number] of Birth: 12Apr 21, 1947

## 2021-08-15 ENCOUNTER — Ambulatory Visit: Payer: Medicare Other

## 2021-08-15 DIAGNOSIS — R262 Difficulty in walking, not elsewhere classified: Secondary | ICD-10-CM

## 2021-08-15 DIAGNOSIS — R2681 Unsteadiness on feet: Secondary | ICD-10-CM

## 2021-08-15 DIAGNOSIS — M6281 Muscle weakness (generalized): Secondary | ICD-10-CM

## 2021-08-15 NOTE — Therapy (Addendum)
[place] MAIN [facility] SERVICES [address] [place], [name], [address] Phone: [phone]   Fax:  [phone]  Physical Therapy Treatment  Patient Details  Name: Nicholas [name] MRN: [id number] Date of Birth: 12-Mar-1945 No data recorded  Encounter Date: 08/15/2021   PT End of Session - 08/15/21 1404     Visit Number 44    Number of Visits 70    Date for PT Re-Evaluation 08/29/21    Authorization Type 8/10 PN 3/13    Authorization Time Period 03/14/2021-06/06/2021    Progress Note Due on Visit 20    PT Start Time 1100    PT Stop Time 1146    PT Time Calculation (min) 46 min    Equipment Utilized During Treatment Gait belt    Activity Tolerance Patient tolerated treatment well;No increased pain;Patient limited by fatigue    Behavior During Therapy [facility] for tasks assessed/performed             Past Medical History:  Diagnosis Date   A-fib ([place])    Cancer ([place]) 10/25/2019   Prostate   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   Subjective Assessment - 08/15/21 1101     Subjective Pt reports he is still stiff from last session but overall doing well. Has been doing extra housework recently because daughter and family is coming to visit today.    Pertinent History Pt is a 76 y/o male, returning to PT following admission to ED for evaluation of weakness s/p fall where pt was found to be COVID positive. Pt was in the hospital from 02/17/2021-02/23/2021. Pt was then d/c to home where he received [place] PT.  Pt reports increased LBP since d/c from hospital and decreased balance since last in outpatient PT. While pt using QC currently, he reports he has been using a RW at home or grabbing onto the wall to steady himself while walking. Pt reports he has been walking home distances. PMH significant for TIA, a-fib, pulmonary embolism, TIA, gout, rhabdomyolysis.    Limitations Standing;Walking;House hold activities;Lifting    How long can you sit comfortably? not limited    How long can you stand comfortably? pt reports limited due to feeling unsteady    How long can you walk comfortably? Pt reports he is currently walking household distances    Patient Stated Goals Pt would like to improve his walking and balance    Currently in Pain? No/denies    Pain Onset More than a month ago               INTERVENTIONS - gait belt donned and CGA provided throughout unless otherwise noted   TherEx:   Physio-ball roll outs: FWD/BWD & LTL, 10x each   STS: 15x, then 10x   Seated: LAQ: 2x15 B, 5# ankle weights donned each LE   NMR:  Airex: WBOS with reaching via magnet organization by color & alphabetically - 6 min NBOS, EO - 2x30 sec NBOS,  EC - 2x30 sec, min sway NBOS, ball toss - multiple reps with varying tosses, min LOB  Hurdle step overs - FWD/BWD & LTL, 20x each direction, min LOB  Alternating FWD lunge onto BOSU - 2x10, mod LOB, intermittent handheld assist provided by SPT, pt rates as challenging   Pt educated throughout session about proper posture and technique with exercises. Improved exercise technique, movement at target joints, use of target muscles after min to mod verbal, visual, tactile cues.  Rationale for Evaluation and Treatment Rehabilitation  Izola Price, SPT  This entire session was performed under direct supervision and direction of a licensed therapist/therapist assistant . I have personally read, edited and approve of the note as written. Ricard Dillon PT,  DPT       PT Education - 08/15/21 1404     Education Details exercise techniques, body mechanics    Person(s) Educated Patient    Methods Explanation;Demonstration;Verbal cues    Comprehension Verbalized understanding;Returned demonstration              PT Short Term Goals - 07/29/21 1117       PT SHORT TERM GOAL #1   Title Patient will be independent in home exercise program to improve balance, and strength/mobility for better functional independence with ADLs and decreased fall risk.    Baseline 03/14/21: to be initiated within next 1-2 sessions 2/6: HEP compliance    Time 6    Period Weeks    Status Achieved    Target Date 06/06/21      PT SHORT TERM GOAL #2   Title Pt will improve gross BLE strength to 5/5 in order to increase ease and safety with mobility and ADLs.    Baseline 03/14/21: strength 4+/5 B, greatest deficit in B ankle DFs 4-/5 B 2/6: grossly 4+/5 with df 4/5 3/13: see note; 5/22: grossly 4+/5 bilat, weakness still most prominent in PF    Time 6    Period Weeks    Status Partially Met    Target Date 09/09/21               PT Long Term Goals - 07/29/21 0940       PT LONG TERM GOAL #1   Title Patient will increase FOTO score to greater than 59 to demonstrate statistically significant improvement in mobility and quality of life.    Baseline 03/14/21: 58 2/6: 57% 3/13: 57.4%; 3/30: 62    Time 12    Period Weeks    Status Achieved    Target Date 06/06/21      PT LONG TERM GOAL #2   Title Patient (> 8 years old) will complete five times sit to stand test in < 10 seconds indicating increased LE strength and decreased fall risk    Baseline 03/14/21: 12 seconds 2/6: 12 seconds 3/13: 13.8 seconds without hands; 3/30: 13 sec; 4/1; 5/22: 12 sec hands free    Time 12    Period Weeks    Status Partially Met    Target Date 08/29/21      PT LONG TERM GOAL #3   Title Pt will improve DGI by at least 3 points in order to demonstrate clinically significant  improvement in balance and decreased risk for falls.    Baseline 03/14/21: 15/24 2/6: 17/24 3/13: 19/24; 3/30: 21/24    Time 12    Period Weeks    Status Achieved    Target Date 06/06/21      PT LONG TERM GOAL #  4   Title Pt will increase 6MWT by at least 33m(1662f in order to demonstrate clinically significant improvement in cardiopulmonary endurance and community ambulation    Baseline 03/14/21: 795 ft with QC 2/6: 865 ft without AD 3/13: 770 ft with walking stick with knee pain; 3/30: 1056 ft    Time 12    Period Weeks    Status Achieved    Target Date 08/29/21      PT LONG TERM GOAL #5   Title The patient will be able to maintain >5 seconds of SLB on BLEs in order to decrease risk of falls when navigating obstacles, curbs and steps.    Baseline 03/14/21: RLE 4 seconds, LLE 2 seconds 2/6: L 3 seconds R 4 seconds 3/13: L 4 seconds with pain R 15 seconds; 3/30: L 4 sec R 8 sec; 5/22: 2 sec RLE, 3 sec LLE    Time 12    Period Weeks    Status On-going    Target Date 08/29/21      PT LONG TERM GOAL #6   Title Patient will increase 10 meter walk test to at least 1.0 m/s with LRD as to improve gait speed for better community ambulation and to reduce fall risk.    Baseline 03/14/21: 0.67 m/s 2/6: 1.3 m/s    Time 12    Period Weeks    Status Achieved    Target Date 06/06/21                  Plan - 08/15/21 1405     Clinical Impression Statement Pt is highly motivated for physical therapy today. Ball rollouts performed in order to loosen up the back following reports of stiffness. Improved balance noted with WBOS activities. Pt challenged by balance progressions involving NBOS and dual tasks, but was able to perform with intermittent UE support and only minor LOB noted. The pt will benefit from further skilled PT to improve strength, balance and mobility.    Personal Factors and Comorbidities Age;Past/Current Experience;Comorbidity 3+    Comorbidities A-fib, lumbar disc disease,  prostate CA, TIA, gout, hx of pulmonary embolism    Examination-Activity Limitations Carry;Lift;Stand;Locomotion Level;Bend;Dressing;Reach Overhead;Squat;Transfers;Caring for Others;Stairs    Examination-Participation Restrictions Community Activity;Yard Work;Cleaning;Laundry;Shop;Church    Stability/Clinical Decision Making Evolving/Moderate complexity    Rehab Potential Good    PT Frequency 2x / week    PT Duration 12 weeks    PT Treatment/Interventions ADLs/Self Care Home Management;Biofeedback;Aquatic Therapy;Canalith Repostioning;Cryotherapy;Electrical Stimulation;Iontophoresis 5m82ml Dexamethasone;Moist Heat;Traction;Ultrasound;Gait training;Stair training;DME Instruction;Functional mobility training;Therapeutic activities;Therapeutic exercise;Balance training;Neuromuscular re-education;Patient/family education;Manual techniques;Passive range of motion;Dry needling;Energy conservation;Vestibular;Joint Manipulations;Orthotic Fit/Training;Splinting;Taping;Visual/perceptual remediation/compensation    PT Next Visit Plan strengthening, gait, balance, continue with POC    PT Home Exercise Plan 1/9: Access Code: [email or web address] Access Code: [email or web address]    Consulted and Agree with Plan of Care Patient             Patient will benefit from skilled therapeutic intervention in order to improve the following deficits and impairments:  Abnormal gait, Decreased balance, Decreased range of motion, Decreased strength, Hypomobility, Impaired sensation, Pain, Improper body mechanics, Impaired flexibility, Decreased mobility, Decreased activity tolerance, Decreased endurance, Difficulty walking, Increased muscle spasms, Postural dysfunction  Visit Diagnosis: Unsteadiness on feet  Muscle weakness (generalized)  Difficulty in walking, not elsewhere classified     Problem List Patient Active Problem List   Diagnosis Date Noted   COVID-19 virus infection 02/17/2021   [place]  TIA  (transient ischemic  attack)    Lactic acidosis    Hyponatremia    Hypotension    Generalized weakness     Zollie Pee, PT 08/15/2021, 2:42 PM  [place] MAIN [facility] SERVICES [address] [place], [name], [address] Phone: [phone]   Fax:  [phone]  Name: Nicholas [name] MRN: [id number] Date of Birth: 05-12-1945

## 2021-08-19 ENCOUNTER — Ambulatory Visit: Payer: Medicare Other

## 2021-08-19 ENCOUNTER — Telehealth: Payer: Self-pay | Admitting: Cardiovascular Disease

## 2021-08-19 DIAGNOSIS — M6281 Muscle weakness (generalized): Secondary | ICD-10-CM

## 2021-08-19 DIAGNOSIS — R2681 Unsteadiness on feet: Secondary | ICD-10-CM

## 2021-08-19 DIAGNOSIS — R262 Difficulty in walking, not elsewhere classified: Secondary | ICD-10-CM

## 2021-08-19 DIAGNOSIS — R278 Other lack of coordination: Secondary | ICD-10-CM

## 2021-08-19 NOTE — Telephone Encounter (Signed)
3 attempts to schedule fu appt from recall list.   Deleting recall.   

## 2021-08-19 NOTE — Therapy (Signed)
[place] MAIN [facility] SERVICES [address] [place], [name], [address] Phone: [phone]   Fax:  [phone]  Physical Therapy Treatment  Patient Details  Name: Nicholas [name] MRN: [id number] Date of Birth: 30-Nov-1945 No data recorded  Encounter Date: 08/19/2021   PT End of Session - 08/19/21 1024     Visit Number 45    Number of Visits 81    Date for PT Re-Evaluation 08/29/21    Authorization Type 8/10 PN 3/13    Authorization Time Period 03/14/2021-06/06/2021    Progress Note Due on Visit 20    PT Start Time 1017    PT Stop Time 1103    PT Time Calculation (min) 46 min    Equipment Utilized During Treatment Gait belt    Activity Tolerance Patient tolerated treatment well;No increased pain    Behavior During Therapy [facility] for tasks assessed/performed             Past Medical History:  Diagnosis Date   A-fib ([place])    Cancer ([place]) 10/25/2019   Prostate   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   Subjective Assessment - 08/19/21 1017     Subjective Pt reports grandson visited this weekend. They played mini golf and he had to bend to pick up golf balls. He reports initially it hurt his back but then overall improved his back symptoms.    Pertinent History Pt is a 76 y/o male, returning to PT following admission to ED for evaluation of weakness s/p fall where pt was found to be COVID positive. Pt was in the hospital from 02/17/2021-02/23/2021. Pt was then d/c to home where he received [place] PT. Pt  reports increased LBP since d/c from hospital and decreased balance since last in outpatient PT. While pt using QC currently, he reports he has been using a RW at home or grabbing onto the wall to steady himself while walking. Pt reports he has been walking home distances. PMH significant for TIA, a-fib, pulmonary embolism, TIA, gout, rhabdomyolysis.    Limitations Standing;Walking;House hold activities;Lifting    How long can you sit comfortably? not limited    How long can you stand comfortably? pt reports limited due to feeling unsteady    How long can you walk comfortably? Pt reports he is currently walking household distances    Patient Stated Goals Pt would like to improve his walking and balance    Currently in Pain? No/denies    Pain Onset More than a month ago              INTERVENTIONS - gait belt donned and CGA provided throughout unless otherwise noted   TherEx:  Treadmill endurance training HIIT  @ 2% elevation: base speed 1.7 mph to 2.5 mph for 30 sec intervals - 5 min 20 sec total    STS: 2x13  5# weights donned each LE- Seated 5 marches followed by 5 LAQ x 4 sets each LE .  Cuing to modify ROM of intervention due to L knee discomfort. With modification pt reports improvement in knee discomfort.    NMR:  In // bars: 3 half-foam bolsters placed on floor: pt cued to step over half-foam FWD/BCKWD with BUE support and without BUE support 12x each direction Pt exhibits decreased postural stability with retro-stepping Changed to lateral stepping. Attempts without UE support but requires intermittent UE support 12x each direction  Lunge step onto half bosu (round side up) with holding lunge position 2-3 sec with 2 mini-lunges per rep x multiple reps each LE. Intermittent UE support. Pt rates medium +  Airex: Tandem 1x30 sec each LE. Intermittent UE support. NBOS EC 30 sec. Intermittent UE support   Pt educated throughout session about proper posture and technique with  exercises. Improved exercise technique, movement at target joints, use of target muscles after min to mod verbal, visual, tactile cues.   Rationale for Evaluation and Treatment Rehabilitation   Izola Price, SPT   This entire session was performed under direct supervision and direction of a licensed therapist. I have personally read, edited and approve of the note as written. Ricard Dillon PT, DPT      PT Education - 08/19/21 1023     Education Details exercise technique    Person(s) Educated Patient    Methods Explanation;Demonstration;Verbal cues    Comprehension Verbalized understanding;Returned demonstration;Verbal cues required;Need further instruction              PT Short Term Goals - 07/29/21 1117       PT SHORT TERM GOAL #1   Title Patient will be independent in home exercise program to improve balance, and strength/mobility for better functional independence with ADLs and decreased fall risk.    Baseline 03/14/21: to be initiated within next 1-2 sessions 2/6: HEP compliance    Time 6    Period Weeks    Status Achieved    Target Date 06/06/21      PT SHORT TERM GOAL #2   Title Pt will improve gross BLE strength to 5/5 in order to increase ease and safety with mobility and ADLs.    Baseline 03/14/21: strength 4+/5 B, greatest deficit in B ankle DFs 4-/5 B 2/6: grossly 4+/5 with df 4/5 3/13: see note; 5/22: grossly 4+/5 bilat, weakness still most prominent in PF    Time 6    Period Weeks    Status Partially Met    Target Date 09/09/21               PT Long Term Goals - 07/29/21 0940       PT LONG TERM GOAL #1   Title Patient will increase FOTO score to greater than 59 to demonstrate statistically significant improvement in mobility and quality of life.    Baseline 03/14/21: 58 2/6: 57% 3/13: 57.4%; 3/30: 62    Time 12    Period Weeks    Status Achieved    Target Date 06/06/21      PT LONG TERM GOAL #2   Title Patient (> 62 years old) will complete five  times sit to stand test in < 10 seconds indicating increased LE strength and decreased fall risk    Baseline 03/14/21: 12 seconds 2/6: 12 seconds 3/13: 13.8 seconds without hands; 3/30: 13 sec; 4/1; 5/22: 12 sec hands free    Time 12    Period Weeks    Status Partially Met    Target Date 08/29/21  PT LONG TERM GOAL #3   Title Pt will improve DGI by at least 3 points in order to demonstrate clinically significant improvement in balance and decreased risk for falls.    Baseline 03/14/21: 15/24 2/6: 17/24 3/13: 19/24; 3/30: 21/24    Time 12    Period Weeks    Status Achieved    Target Date 06/06/21      PT LONG TERM GOAL #4   Title Pt will increase 6MWT by at least 22m(1661f in order to demonstrate clinically significant improvement in cardiopulmonary endurance and community ambulation    Baseline 03/14/21: 795 ft with QC 2/6: 865 ft without AD 3/13: 770 ft with walking stick with knee pain; 3/30: 1056 ft    Time 12    Period Weeks    Status Achieved    Target Date 08/29/21      PT LONG TERM GOAL #5   Title The patient will be able to maintain >5 seconds of SLB on BLEs in order to decrease risk of falls when navigating obstacles, curbs and steps.    Baseline 03/14/21: RLE 4 seconds, LLE 2 seconds 2/6: L 3 seconds R 4 seconds 3/13: L 4 seconds with pain R 15 seconds; 3/30: L 4 sec R 8 sec; 5/22: 2 sec RLE, 3 sec LLE    Time 12    Period Weeks    Status On-going    Target Date 08/29/21      PT LONG TERM GOAL #6   Title Patient will increase 10 meter walk test to at least 1.0 m/s with LRD as to improve gait speed for better community ambulation and to reduce fall risk.    Baseline 03/14/21: 0.67 m/s 2/6: 1.3 m/s    Time 12    Period Weeks    Status Achieved    Target Date 06/06/21                   Plan - 08/19/21 1034     Clinical Impression Statement Pt with excellent motivation to participate in session. Focus on SLB progressions and retro-stepping activities. Pt requires  frequent use of step strategy with retrostepping, however, pt balance did improve with reps. Pt generally requires intermittent UE support for SLB progressions. The pt will benefit from further skilled PT to improve strength, balance and mobility to decrease fall risk.    Personal Factors and Comorbidities Age;Past/Current Experience;Comorbidity 3+    Comorbidities A-fib, lumbar disc disease, prostate CA, TIA, gout, hx of pulmonary embolism    Examination-Activity Limitations Carry;Lift;Stand;Locomotion Level;Bend;Dressing;Reach Overhead;Squat;Transfers;Caring for Others;Stairs    Examination-Participation Restrictions Community Activity;Yard Work;Cleaning;Laundry;Shop;Church    Stability/Clinical Decision Making Evolving/Moderate complexity    Rehab Potential Good    PT Frequency 2x / week    PT Duration 12 weeks    PT Treatment/Interventions ADLs/Self Care Home Management;Biofeedback;Aquatic Therapy;Canalith Repostioning;Cryotherapy;Electrical Stimulation;Iontophoresis 61m71ml Dexamethasone;Moist Heat;Traction;Ultrasound;Gait training;Stair training;DME Instruction;Functional mobility training;Therapeutic activities;Therapeutic exercise;Balance training;Neuromuscular re-education;Patient/family education;Manual techniques;Passive range of motion;Dry needling;Energy conservation;Vestibular;Joint Manipulations;Orthotic Fit/Training;Splinting;Taping;Visual/perceptual remediation/compensation    PT Next Visit Plan strengthening, gait, balance, continue with POC    PT Home Exercise Plan 1/9: Access Code: [email or web address]: Access Code: [email or web address] updates    Consulted and Agree with Plan of Care Patient             Patient will benefit from skilled therapeutic intervention in order to improve the following deficits and impairments:  Abnormal gait, Decreased balance, Decreased range of motion, Decreased strength, Hypomobility, Impaired sensation, Pain, Improper body  mechanics, Impaired flexibility,  Decreased mobility, Decreased activity tolerance, Decreased endurance, Difficulty walking, Increased muscle spasms, Postural dysfunction  Visit Diagnosis: Unsteadiness on feet  Muscle weakness (generalized)  Difficulty in walking, not elsewhere classified  Other lack of coordination     Problem List Patient Active Problem List   Diagnosis Date Noted   COVID-19 virus infection 02/17/2021   A-fib ([place])    TIA (transient ischemic attack)    Lactic acidosis    Hyponatremia    Hypotension    Generalized weakness     Zollie Pee, PT 08/19/2021, 1:20 PM  [place] MAIN [facility] SERVICES [place], [name], [address] Phone: [phone]   Fax:  [phone]  Name: Nicholas [name] MRN: [id number] Date of Birth: [birth year]

## 2021-08-22 ENCOUNTER — Ambulatory Visit: Payer: Medicare Other

## 2021-08-22 DIAGNOSIS — R2681 Unsteadiness on feet: Secondary | ICD-10-CM

## 2021-08-22 DIAGNOSIS — M6281 Muscle weakness (generalized): Secondary | ICD-10-CM

## 2021-08-22 DIAGNOSIS — R262 Difficulty in walking, not elsewhere classified: Secondary | ICD-10-CM

## 2021-08-22 DIAGNOSIS — R278 Other lack of coordination: Secondary | ICD-10-CM

## 2021-08-22 NOTE — Therapy (Signed)
[place] MAIN [facility] SERVICES [address] [place], [name], [address] Phone: [phone]   Fax:  [phone]  Physical Therapy Treatment  Patient Details  Name: Nicholas [name] MRN: [id number] Date of Birth: 20-Jun-1945 No data recorded  Encounter Date: 08/22/2021   PT End of Session - 08/22/21 1200     Visit Number 95    Number of Visits 74    Date for PT Re-Evaluation 08/29/21    Authorization Type 8/10 PN 3/13    Authorization Time Period 03/14/2021-06/06/2021    Progress Note Due on Visit 20    PT Start Time 1101    PT Stop Time 1145    PT Time Calculation (min) 44 min    Equipment Utilized During Treatment Gait belt    Activity Tolerance Patient tolerated treatment well;No increased pain    Behavior During Therapy [facility] for tasks assessed/performed             Past Medical History:  Diagnosis Date   A-fib ([place])    Cancer ([place]) 10/25/2019   Prostate   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   Subjective Assessment - 08/22/21 1101     Subjective Pt reports that back has been stiff for the last 2 days, "yesterday was strangle stiff". Pt's grandkids will be visiting this weekend.    Pertinent History Pt is a 76 y/o male, returning to PT following admission to ED for evaluation of weakness s/p fall where pt was found to be COVID positive. Pt was in the hospital from 02/17/2021-02/23/2021. Pt was then d/c to home where he received [place] PT. Pt reports increased LBP since d/c from hospital and  decreased balance since last in outpatient PT. While pt using QC currently, he reports he has been using a RW at home or grabbing onto the wall to steady himself while walking. Pt reports he has been walking home distances. PMH significant for TIA, a-fib, pulmonary embolism, TIA, gout, rhabdomyolysis.    Limitations Standing;Walking;House hold activities;Lifting    How long can you sit comfortably? not limited    How long can you stand comfortably? pt reports limited due to feeling unsteady    How long can you walk comfortably? Pt reports he is currently walking household distances    Patient Stated Goals Pt would like to improve his walking and balance    Currently in Pain? No/denies    Pain Onset More than a month ago             INTERVENTIONS - gait belt donned and CGA provided throughout unless otherwise noted   TherEx:  Physioball rollouts: FWD & LTL, 10x each with 3-5 sec hold Seated trunk extension: 10x   Grade I-II mobilizations, L1-5, 5 x 20 sec bouts each, pt prone with wedge under chest for comfort  Prone press ups: 2 x10, with wedge under chest for comfort, VC for small arc to start  STS: 10x, minor LOB noted  Treadmill  for LE and cardiovascular endurance: 5 mins, 1.3 mph, up to 4% elevation (increased every minute)    NMR:   In // bars: Lunge step onto half bosu (round side up): 10x each side, intermittent UE support   Airex: WBOS EC 30 sec NBOS EC 30 sec, intermittent UE support, min-mod sway Tandem stance with shooting basketballs: 1 round each LE, intermittent UE support   Pt educated throughout session about proper posture and technique with exercises. Improved exercise technique, movement at target joints, use of target muscles after min to mod verbal, visual, tactile cues.  Rationale for Evaluation and Treatment Rehabilitation  Izola Price, SPT   This entire session was performed under direct supervision and direction of a licensed therapist. I  have personally read, edited and approve of the note as written. Ricard Dillon PT, DPT     PT Education - 08/22/21 1200     Education Details exercise technique, body mechanics    Person(s) Educated Patient    Methods Explanation;Demonstration;Tactile cues;Verbal cues    Comprehension Verbalized understanding;Returned demonstration;Verbal cues required              PT Short Term Goals - 07/29/21 1117       PT SHORT TERM GOAL #1   Title Patient will be independent in home exercise program to improve balance, and strength/mobility for better functional independence with ADLs and decreased fall risk.    Baseline 03/14/21: to be initiated within next 1-2 sessions 2/6: HEP compliance    Time 6    Period Weeks    Status Achieved    Target Date 06/06/21      PT SHORT TERM GOAL #2   Title Pt will improve gross BLE strength to 5/5 in order to increase ease and safety with mobility and ADLs.    Baseline 03/14/21: strength 4+/5 B, greatest deficit in B ankle DFs 4-/5 B 2/6: grossly 4+/5 with df 4/5 3/13: see note; 5/22: grossly 4+/5 bilat, weakness still most prominent in PF    Time 6    Period Weeks    Status Partially Met    Target Date 09/09/21               PT Long Term Goals - 07/29/21 0940       PT LONG TERM GOAL #1   Title Patient will increase FOTO score to greater than 59 to demonstrate statistically significant improvement in mobility and quality of life.    Baseline 03/14/21: 58 2/6: 57% 3/13: 57.4%; 3/30: 62    Time 12    Period Weeks    Status Achieved    Target Date 06/06/21      PT LONG TERM GOAL #2   Title Patient (> 17 years old) will complete five times sit to stand test in < 10 seconds indicating increased LE strength and decreased fall risk    Baseline 03/14/21: 12 seconds 2/6: 12 seconds 3/13: 13.8 seconds without hands; 3/30: 13 sec; 4/1; 5/22: 12 sec hands free    Time 12    Period Weeks    Status Partially Met    Target Date 08/29/21      PT LONG TERM  GOAL #3   Title Pt will improve DGI by at least 3 points in order to demonstrate clinically significant improvement in balance and decreased risk for falls.    Baseline 03/14/21: 15/24 2/6: 17/24 3/13: 19/24; 3/30: 21/24    Time 12    Period Weeks  Status Achieved    Target Date 06/06/21      PT LONG TERM GOAL #4   Title Pt will increase 6MWT by at least 75m(165f in order to demonstrate clinically significant improvement in cardiopulmonary endurance and community ambulation    Baseline 03/14/21: 795 ft with QC 2/6: 865 ft without AD 3/13: 770 ft with walking stick with knee pain; 3/30: 1056 ft    Time 12    Period Weeks    Status Achieved    Target Date 08/29/21      PT LONG TERM GOAL #5   Title The patient will be able to maintain >5 seconds of SLB on BLEs in order to decrease risk of falls when navigating obstacles, curbs and steps.    Baseline 03/14/21: RLE 4 seconds, LLE 2 seconds 2/6: L 3 seconds R 4 seconds 3/13: L 4 seconds with pain R 15 seconds; 3/30: L 4 sec R 8 sec; 5/22: 2 sec RLE, 3 sec LLE    Time 12    Period Weeks    Status On-going    Target Date 08/29/21      PT LONG TERM GOAL #6   Title Patient will increase 10 meter walk test to at least 1.0 m/s with LRD as to improve gait speed for better community ambulation and to reduce fall risk.    Baseline 03/14/21: 0.67 m/s 2/6: 1.3 m/s    Time 12    Period Weeks    Status Achieved    Target Date 06/06/21                   Plan - 08/22/21 1201     Clinical Impression Statement Pt presented with increase lower back pain, however highly motivated to participate in session. Focus on lumbar mobility and pain, and balance. Pt reports improvement in LBP following manual and prone exercise. Pt continues to require intermittent UE support for SLB progressions, but demonstrates improvement with LOB and sway noted. The pt will benefit from further skilled PT to improve strength, balance and mobility to decrease fall risk     Personal Factors and Comorbidities Age;Past/Current Experience;Comorbidity 3+    Comorbidities A-fib, lumbar disc disease, prostate CA, TIA, gout, hx of pulmonary embolism    Examination-Activity Limitations Carry;Lift;Stand;Locomotion Level;Bend;Dressing;Reach Overhead;Squat;Transfers;Caring for Others;Stairs    Examination-Participation Restrictions Community Activity;Yard Work;Cleaning;Laundry;Shop;Church    Stability/Clinical Decision Making Evolving/Moderate complexity    Rehab Potential Good    PT Frequency 2x / week    PT Duration 12 weeks    PT Treatment/Interventions ADLs/Self Care Home Management;Biofeedback;Aquatic Therapy;Canalith Repostioning;Cryotherapy;Electrical Stimulation;Iontophoresis 35m57ml Dexamethasone;Moist Heat;Traction;Ultrasound;Gait training;Stair training;DME Instruction;Functional mobility training;Therapeutic activities;Therapeutic exercise;Balance training;Neuromuscular re-education;Patient/family education;Manual techniques;Passive range of motion;Dry needling;Energy conservation;Vestibular;Joint Manipulations;Orthotic Fit/Training;Splinting;Taping;Visual/perceptual remediation/compensation    PT Next Visit Plan strengthening, mobility, gait, balance, continue with POC    PT Home Exercise Plan 1/9: Access Code: [email or web address]: Access Code: [email or web address]    Consulted and Agree with Plan of Care Patient             Patient will benefit from skilled therapeutic intervention in order to improve the following deficits and impairments:  Abnormal gait, Decreased balance, Decreased range of motion, Decreased strength, Hypomobility, Impaired sensation, Pain, Improper body mechanics, Impaired flexibility, Decreased mobility, Decreased activity tolerance, Decreased endurance, Difficulty walking, Increased muscle spasms, Postural dysfunction  Visit Diagnosis: Unsteadiness on feet  Muscle weakness (generalized)  Difficulty in walking, not elsewhere  classified  Other lack of coordination     Problem  List Patient Active Problem List   Diagnosis Date Noted   COVID-19 virus infection 02/17/2021   A-fib ([place])    TIA (transient ischemic attack)    Lactic acidosis    Hyponatremia    Hypotension    Generalized weakness     Izola Price, Student-PT 08/22/2021, 12:08 PM  [place] MAIN [facility] SERVICES [address] [place], [name], [address] Phone: [phone]   Fax:  [phone]  Name: ELL TISO MRN: [id number] Date of Birth: 1946-02-07

## 2021-08-26 ENCOUNTER — Ambulatory Visit: Payer: Medicare Other

## 2021-08-26 DIAGNOSIS — R2681 Unsteadiness on feet: Secondary | ICD-10-CM

## 2021-08-26 DIAGNOSIS — R262 Difficulty in walking, not elsewhere classified: Secondary | ICD-10-CM

## 2021-08-26 DIAGNOSIS — R278 Other lack of coordination: Secondary | ICD-10-CM

## 2021-08-26 DIAGNOSIS — M6281 Muscle weakness (generalized): Secondary | ICD-10-CM

## 2021-08-26 NOTE — Therapy (Signed)
[place] MAIN [facility] SERVICES [address] [place], [name], [address] Phone: [phone]   Fax:  [phone]  Physical Therapy Treatment  Patient Details  Name: BASSAM DRESCH MRN: [id number] Date of Birth: 09-Jun-1945 No data recorded  Encounter Date: 08/26/2021   PT End of Session - 08/26/21 1151     Visit Number 51    Number of Visits 85    Date for PT Re-Evaluation 08/29/21    Authorization Type 8/10 PN 3/13    Authorization Time Period 03/14/2021-06/06/2021    Progress Note Due on Visit 20    PT Start Time 1147    PT Stop Time 1230    PT Time Calculation (min) 43 min    Equipment Utilized During Treatment Gait belt    Activity Tolerance Patient tolerated treatment well;No increased pain    Behavior During Therapy 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   Subjective Assessment - 08/26/21 1149     Subjective Pt reports back "hurt a lot yesterday" but is doing better today.    Pertinent History Pt is a 76 y/o male, returning to PT following admission to ED for evaluation of weakness s/p fall where pt was found to be COVID positive. Pt was in the hospital from 02/17/2021-02/23/2021. Pt was then d/c to home where he received [facility]. Pt reports increased LBP since d/c from hospital and decreased balance since last in outpatient PT. While pt using QC  currently, he reports he has been using a RW at home or grabbing onto the wall to steady himself while walking. Pt reports he has been walking home distances. PMH significant for TIA, a-fib, pulmonary embolism, TIA, gout, rhabdomyolysis.    Limitations Standing;Walking;House hold activities;Lifting    How long can you sit comfortably? not limited    How long can you stand comfortably? pt reports limited due to feeling unsteady    How long can you walk comfortably? Pt reports he is currently walking household distances    Patient Stated Goals Pt would like to improve his walking and balance    Currently in Pain? No/denies    Pain Onset More than a month ago             INTERVENTIONS - gait belt donned and CGA provided throughout unless otherwise noted     TherEx:   Physioball rollouts: FWD & LTL, 20x each with 5 sec hold   Endurance HIIT Training: STS 10x immediately followed by a big lap around clinic, 2 rounds, 3# ankle weights donned, rest break taken between rounds Comments: minor LOB noted with STS & minor path deviations/sway during ambulation, able to self correct      NMR:   In // bars: Airex: - NBOS, EO: 60  sec - NBOS, EC: 2 x 30 sec, intermittent UE support - Tandem stance: 2 x 30 sec each LE, inc difficulty for pt - NBOS, balloon toss: 3 mins, occ mod sway requiring UE support   Alt lunge step onto half bosu (round side up): 2 x 10 each side, intermittent UE support    Pt educated throughout session about proper posture and technique with exercises. Improved exercise technique, movement at target joints, use of target muscles after min to mod verbal, visual, tactile cues.  Rationale for Evaluation and Treatment Rehabilitation  Izola Price, SPT  This entire session was performed under direct supervision and direction of a licensed therapist/therapist assistant . I have personally read, edited and approve of the note as written. Ricard Dillon PT, DPT     PT  Education - 08/26/21 1150     Education Details exercise technique, body mechanics    Person(s) Educated Patient    Methods Explanation;Demonstration;Verbal cues;Tactile cues    Comprehension Verbalized understanding;Returned demonstration;Verbal cues required              PT Short Term Goals - 07/29/21 1117       PT SHORT TERM GOAL #1   Title Patient will be independent in home exercise program to improve balance, and strength/mobility for better functional independence with ADLs and decreased fall risk.    Baseline 03/14/21: to be initiated within next 1-2 sessions 2/6: HEP compliance    Time 6    Period Weeks    Status Achieved    Target Date 06/06/21      PT SHORT TERM GOAL #2   Title Pt will improve gross BLE strength to 5/5 in order to increase ease and safety with mobility and ADLs.    Baseline 03/14/21: strength 4+/5 B, greatest deficit in B ankle DFs 4-/5 B 2/6: grossly 4+/5 with df 4/5 3/13: see note; 5/22: grossly 4+/5 bilat, weakness still most prominent in PF    Time 6    Period Weeks    Status Partially Met    Target Date 09/09/21               PT Long Term Goals - 07/29/21 0940       PT LONG TERM GOAL #1   Title Patient will increase FOTO score to greater than 59 to demonstrate statistically significant improvement in mobility and quality of life.    Baseline 03/14/21: 58 2/6: 57% 3/13: 57.4%; 3/30: 62    Time 12    Period Weeks    Status Achieved    Target Date 06/06/21      PT LONG TERM GOAL #2   Title Patient (> 67 years old) will complete five times sit to stand test in < 10 seconds indicating increased LE strength and decreased fall risk    Baseline 03/14/21: 12 seconds 2/6: 12 seconds 3/13: 13.8 seconds without hands; 3/30: 13 sec; 4/1; 5/22: 12 sec hands free    Time 12    Period Weeks    Status Partially Met    Target Date 08/29/21      PT LONG TERM GOAL #3   Title Pt will improve DGI by at least 3 points in order to demonstrate clinically  significant improvement in balance and decreased risk for falls.    Baseline 03/14/21: 15/24 2/6: 17/24 3/13: 19/24; 3/30: 21/24    Time 12    Period Weeks    Status Achieved    Target Date 06/06/21  PT LONG TERM GOAL #4   Title Pt will increase 6MWT by at least 65m(1668f in order to demonstrate clinically significant improvement in cardiopulmonary endurance and community ambulation    Baseline 03/14/21: 795 ft with QC 2/6: 865 ft without AD 3/13: 770 ft with walking stick with knee pain; 3/30: 1056 ft    Time 12    Period Weeks    Status Achieved    Target Date 08/29/21      PT LONG TERM GOAL #5   Title The patient will be able to maintain >5 seconds of SLB on BLEs in order to decrease risk of falls when navigating obstacles, curbs and steps.    Baseline 03/14/21: RLE 4 seconds, LLE 2 seconds 2/6: L 3 seconds R 4 seconds 3/13: L 4 seconds with pain R 15 seconds; 3/30: L 4 sec R 8 sec; 5/22: 2 sec RLE, 3 sec LLE    Time 12    Period Weeks    Status On-going    Target Date 08/29/21      PT LONG TERM GOAL #6   Title Patient will increase 10 meter walk test to at least 1.0 m/s with LRD as to improve gait speed for better community ambulation and to reduce fall risk.    Baseline 03/14/21: 0.67 m/s 2/6: 1.3 m/s    Time 12    Period Weeks    Status Achieved    Target Date 06/06/21                   Plan - 08/26/21 1259     Clinical Impression Statement Pt presented highly motivated to participate in session. Focus on endurance and progressing balance. Pt tolerated endurance HIIT training implemented today well, with only occ VC for controlling movements secondary to minor LOB during STS. Intermittent UE support still required during more advanced balance exercises. The pt will benefit from further skilled PT to improve strength, balance and mobility to decrease fall risk.    Personal Factors and Comorbidities Age;Past/Current Experience;Comorbidity 3+    Comorbidities A-fib,  lumbar disc disease, prostate CA, TIA, gout, hx of pulmonary embolism    Examination-Activity Limitations Carry;Lift;Stand;Locomotion Level;Bend;Dressing;Reach Overhead;Squat;Transfers;Caring for Others;Stairs    Examination-Participation Restrictions Community Activity;Yard Work;Cleaning;Laundry;Shop;Church    Stability/Clinical Decision Making Evolving/Moderate complexity    Rehab Potential Good    PT Frequency 2x / week    PT Duration 12 weeks    PT Treatment/Interventions ADLs/Self Care Home Management;Biofeedback;Aquatic Therapy;Canalith Repostioning;Cryotherapy;Electrical Stimulation;Iontophoresis 59m37ml Dexamethasone;Moist Heat;Traction;Ultrasound;Gait training;Stair training;DME Instruction;Functional mobility training;Therapeutic activities;Therapeutic exercise;Balance training;Neuromuscular re-education;Patient/family education;Manual techniques;Passive range of motion;Dry needling;Energy conservation;Vestibular;Joint Manipulations;Orthotic Fit/Training;Splinting;Taping;Visual/perceptual remediation/compensation    PT Next Visit Plan strengthening, mobility, gait, balance, continue with POC    PT Home Exercise Plan 1/9: Access Code: [email or web address]: Access Code: [email or web address]    Consulted and Agree with Plan of Care Patient             Patient will benefit from skilled therapeutic intervention in order to improve the following deficits and impairments:  Abnormal gait, Decreased balance, Decreased range of motion, Decreased strength, Hypomobility, Impaired sensation, Pain, Improper body mechanics, Impaired flexibility, Decreased mobility, Decreased activity tolerance, Decreased endurance, Difficulty walking, Increased muscle spasms, Postural dysfunction  Visit Diagnosis: Unsteadiness on feet  Difficulty in walking, not elsewhere classified  Muscle weakness (generalized)  Other lack of coordination     Problem List Patient Active Problem List   Diagnosis Date Noted    COVID-19 virus infection 02/17/2021  A-fib ([place])    TIA (transient ischemic attack)    Lactic acidosis    Hyponatremia    Hypotension    Generalized weakness     Izola Price, Student-PT 08/26/2021, 1:12 PM  [place] MAIN [facility] SERVICES [address] [place], [name], [address] Phone: [phone]   Fax:  [phone]  Name: JACORIAN GOLASZEWSKI MRN: [id number] Date of Birth: Nov 30, 1945

## 2021-08-27 ENCOUNTER — Ambulatory Visit (INDEPENDENT_AMBULATORY_CARE_PROVIDER_SITE_OTHER): Payer: Medicare Other | Admitting: Dermatology

## 2021-08-27 DIAGNOSIS — Z1283 Encounter for screening for malignant neoplasm of skin: Secondary | ICD-10-CM

## 2021-08-27 DIAGNOSIS — L578 Other skin changes due to chronic exposure to nonionizing radiation: Secondary | ICD-10-CM

## 2021-08-27 DIAGNOSIS — L570 Actinic keratosis: Secondary | ICD-10-CM

## 2021-08-27 DIAGNOSIS — L820 Inflamed seborrheic keratosis: Secondary | ICD-10-CM | POA: Diagnosis not present

## 2021-08-27 DIAGNOSIS — L821 Other seborrheic keratosis: Secondary | ICD-10-CM

## 2021-08-27 DIAGNOSIS — L814 Other melanin hyperpigmentation: Secondary | ICD-10-CM | POA: Diagnosis not present

## 2021-08-27 DIAGNOSIS — D229 Melanocytic nevi, unspecified: Secondary | ICD-10-CM

## 2021-08-27 DIAGNOSIS — D1800 Hemangioma unspecified site: Secondary | ICD-10-CM

## 2021-08-27 MED ORDER — FLUOROURACIL 5 % EX CREA: TOPICAL_CREAM | CUTANEOUS | 0 refills | Status: DC

## 2021-08-27 NOTE — Patient Instructions (Addendum)
Continue Ketoconazole 2 % cream apply topically daily to affected areas of face on Monday Wednesday and Friday.  Continue hydrocortisone 2.5 % cream - apply topically to affected areas of face daily on Tuesday, Thursday, Saturday,    5-Fluorouracil/Calcipotriene Patient Education   Actinic keratoses are the dry, red scaly spots on the skin caused by sun damage. A portion of these spots can turn into skin cancer with time, and treating them can help prevent development of skin cancer.   Treatment of these spots requires removal of the defective skin cells. There are various ways to remove actinic keratoses, including freezing with liquid nitrogen, treatment with creams, or treatment with a blue light procedure in the office.   5-fluorouracil cream is a topical cream used to treat actinic keratoses. It works by interfering with the growth of abnormal fast-growing skin cells, such as actinic keratoses. These cells peel off and are replaced by healthy ones.   5-fluorouracil/calcipotriene is a combination of the 5-fluorouracil cream with a vitamin D analog cream called calcipotriene. The calcipotriene alone does not treat actinic keratoses. However, when it is combined with 5-fluorouracil, it helps the 5-fluorouracil treat the actinic keratoses much faster so that the same results can be achieved with a much shorter treatment time.  INSTRUCTIONS FOR 5-FLUOROURACIL/CALCIPOTRIENE CREAM:   5-fluorouracil/calcipotriene cream typically only needs to be used for 4-7 days. A thin layer should be applied twice a day to the treatment areas recommended by your physician.   If your physician prescribed you separate tubes of 5-fluourouracil and calcipotriene, apply a thin layer of 5-fluorouracil followed by a thin layer of calcipotriene.   Avoid contact with your eyes, nostrils, and mouth. Do not use 5-fluorouracil/calcipotriene cream on infected or open wounds.   You will develop redness, irritation and some  crusting at areas where you have pre-cancer damage/actinic keratoses. IF YOU DEVELOP PAIN, BLEEDING, OR SIGNIFICANT CRUSTING, STOP THE TREATMENT EARLY - you have already gotten a good response and the actinic keratoses should clear up well.  Wash your hands after applying 5-fluorouracil 5% cream on your skin.   A moisturizer or sunscreen with a minimum SPF 30 should be applied each morning.   Once you have finished the treatment, you can apply a thin layer of Vaseline twice a day to irritated areas to soothe and calm the areas more quickly. If you experience significant discomfort, contact your physician.  For some patients it is necessary to repeat the treatment for best results.  SIDE EFFECTS: When using 5-fluorouracil/calcipotriene cream, you may have mild irritation, such as redness, dryness, swelling, or a mild burning sensation. This usually resolves within 2 weeks. The more actinic keratoses you have, the more redness and inflammation you can expect during treatment. Eye irritation has been reported rarely. If this occurs, please let us know.   If you have any trouble using this cream, please call the office. If you have any other questions about this information, please do not hesitate to ask me before you leave the office.  5-fluorouracil/calcipotriene cream is is a type of field treatment used to treat precancers, thin skin cancers, and areas of sun damage. Expected reaction includes irritation and mild inflammation potentially progressing to more severe inflammation including redness, scaling, crusting and open sores/erosions.  If too much irritation occurs, ensure application of only a thin layer and decrease frequency of use to achieve a tolerable level of inflammation. Recommend applying Vaseline ointment to open sores as needed.  Minimize sun exposure while under treatment.  Recommend daily broad spectrum sunscreen SPF 30+ to sun-exposed areas, reapply every 2 hours as needed.   Start  5-fluorouracil/calcipotriene cream twice a day for 7 days to affected areas including the nose. Prescription sent to [facility]. Patient provided with contact information for pharmacy and advised the pharmacy will mail the prescription to their home. Patient provided with handout reviewing treatment course and side effects and advised to call or message [place] on MyChart with any concerns.    Due to recent changes in healthcare laws, you may see results of your pathology and/or laboratory studies on MyChart before the doctors have had a chance to review them. We understand that in some cases there may be results that are confusing or concerning to you. Please understand that not all results are received at the same time and often the doctors may need to interpret multiple results in order to provide you with the best plan of care or course of treatment. Therefore, we ask that you please give [place] 2 business days to thoroughly review all your results before contacting the office for clarification. Should we see a critical lab result, you will be contacted sooner.   If You Need Anything After Your Visit  If you have any questions or concerns for your doctor, please call our main line at [phone] and press option 4 to reach your doctor's medical assistant. If no one answers, please leave a voicemail as directed and we will return your call as soon as possible. Messages left after 4 pm will be answered the following business day.   You may also send [place] a message via [place]. We typically respond to MyChart messages within 1-2 business days.  For prescription refills, please ask your pharmacy to contact our office. Our fax number is [phone].  If you have an urgent issue when the clinic is closed that cannot wait until the next business day, you can page your doctor at the number below.    Please note that while we do our best to be available for urgent issues outside of office hours, we are not  available 24/7.   If you have an urgent issue and are unable to reach [place], you may choose to seek medical care at your doctor's office, retail clinic, urgent care center, or emergency room.  If you have a medical emergency, please immediately call 911 or go to the emergency department.  Pager Numbers  - Dr. Nehemiah Massed: [phone]  - Dr. Laurence Ferrari: [phone]  - Dr. Nicole Kindred: [phone]  In the event of inclement weather, please call our main line at [phone] for an update on the status of any delays or closures.  Dermatology Medication Tips: Please keep the boxes that topical medications come in in order to help keep track of the instructions about where and how to use these. Pharmacies typically print the medication instructions only on the boxes and not directly on the medication tubes.   If your medication is too expensive, please contact our office at [phone] option 4 or send [place] a message through [place].   We are unable to tell what your co-pay for medications will be in advance as this is different depending on your insurance coverage. However, we may be able to find a substitute medication at lower cost or fill out paperwork to get insurance to cover a needed medication.   If a prior authorization is required to get your medication covered by your insurance company, please allow [place] 1-2 business days to complete  this process.  Drug prices often vary depending on where the prescription is filled and some pharmacies may offer cheaper prices.  The website www.goodrx.com contains coupons for medications through different pharmacies. The prices here do not account for what the cost may be with help from insurance (it may be cheaper with your insurance), but the website can give you the price if you did not use any insurance.  - You can print the associated coupon and take it with your prescription to the pharmacy.  - You may also stop by our office during regular business hours  and pick up a GoodRx coupon card.  - If you need your prescription sent electronically to a different pharmacy, notify our office through [facility] or by phone at [phone] option 4.     Si Usted Necesita Algo Despus de Su Visita  Tambin puede enviarnos un mensaje a travs de Pharmacist, community. Por lo general respondemos a los mensajes de MyChart en el transcurso de 1 a 2 das hbiles.  Para renovar recetas, por favor pida a su farmacia que se ponga en contacto con nuestra oficina. Harland Dingwall de fax es [place] [phone].  Si tiene un asunto urgente cuando la clnica est cerrada y que no puede esperar hasta el siguiente da hbil, puede llamar/localizar a su doctor(a) al nmero que aparece a continuacin.   Por favor, tenga en cuenta que aunque hacemos todo lo posible para estar disponibles para asuntos urgentes fuera del horario de [place], no estamos disponibles las 24 horas del da, los 7 das de la [place].   Si tiene un problema urgente y no puede comunicarse con nosotros, puede optar por buscar atencin mdica  en el consultorio de su doctor(a), en una clnica privada, en un centro de atencin urgente o en una sala de emergencias.  Si tiene Engineering geologist, por favor llame inmediatamente al 911 o vaya a la sala de emergencias.  Nmeros de bper  - Dr. Nehemiah Massed: [phone]  - Dra. Moye: [phone]  - Dra. Nicole Kindred: [phone]  En caso de inclemencias del [place], por favor llame a Johnsie Kindred principal al [phone] para una actualizacin sobre el [place] de cualquier retraso o cierre.  Consejos para la medicacin en dermatologa: Por favor, guarde las cajas en las que vienen los medicamentos de uso tpico para ayudarle a seguir las instrucciones sobre dnde y cmo usarlos. Las farmacias generalmente imprimen las instrucciones del medicamento slo en las cajas y no directamente en los tubos del [place].   Si su medicamento es muy caro, por favor, pngase  en contacto con Zigmund Daniel llamando al [phone] y presione la opcin 4 o envenos un mensaje a travs de Pharmacist, community.   No podemos decirle cul ser su copago por los medicamentos por adelantado ya que esto es diferente dependiendo de la cobertura de su seguro. Sin embargo, es posible que podamos encontrar un medicamento sustituto a Electrical engineer un formulario para que el seguro cubra el medicamento que se considera necesario.   Si se requiere una autorizacin previa para que su compaa de seguros [place] su medicamento, por favor permtanos de 1 a 2 das hbiles para completar este proceso.  Los precios de los medicamentos varan con frecuencia dependiendo del Environmental consultant de dnde se surte la receta y alguna farmacias pueden ofrecer precios ms baratos.  El sitio web www.goodrx.com tiene cupones para medicamentos de Airline pilot. Los precios aqu no tienen en cuenta lo que podra costar con la ayuda del seguro (  puede ser ms barato con su seguro), pero el sitio web puede darle el precio si no utiliz ningn seguro.  - Puede imprimir el cupn correspondiente y llevarlo con su receta a la farmacia.  - Tambin puede pasar por nuestra oficina durante el horario de atencin regular y recoger una tarjeta de cupones de GoodRx.  - Si necesita que su receta se enve electrnicamente a una farmacia diferente, informe a nuestra oficina a travs de MyChart de [place] o por telfono llamando al [phone] y presione la opcin 4.  

## 2021-08-27 NOTE — Progress Notes (Signed)
Follow-Up Visit   Subjective  Nicholas [name] is a 76 y.o. male who presents for the following: UBSE (Patient c/o itching behind the ears and he would like the areas checked today). The patient presents for Upper Body Skin Exam (UBSE) for skin cancer screening and mole check.  The patient has spots, moles and lesions to be evaluated, some may be new or changing and the patient has concerns that these could be cancer.  The following portions of the chart were reviewed this encounter and updated as appropriate:   Tobacco  Allergies  Meds  Problems  Med Hx  Surg Hx  Fam Hx     Review of Systems:  No other skin or systemic complaints except as noted in HPI or Assessment and Plan.  Objective  Well appearing patient in no apparent distress; mood and affect are within normal limits.  All skin waist up examined.  Nose x 1 Erythematous thin papules/macules with gritty scale.   R neck x 12, L neck x 10 (22) Erythematous stuck-on, waxy papule or plaque   Assessment & Plan  AK (actinic keratosis) Nose x 1 Destruction of lesion - Nose x 1 Complexity: simple   Destruction method: cryotherapy   Informed consent: discussed and consent obtained   Timeout:  patient name, date of birth, surgical site, and procedure verified Lesion destroyed using liquid nitrogen: Yes   Region frozen until ice ball extended beyond lesion: Yes   Outcome: patient tolerated procedure well with no complications   Post-procedure details: wound care instructions given    fluorouracil (EFUDEX) 5 % cream - Nose x 1 Apply to the nose BID x 1 week.  Inflamed seborrheic keratosis (22) R neck x 12, L neck x 10 Symptomatic, irritating, patient would like treated. Destruction of lesion - R neck x 12, L neck x 10 Complexity: simple   Destruction method: cryotherapy   Informed consent: discussed and consent obtained   Timeout:  patient name, date of birth, surgical site, and procedure verified Lesion destroyed  using liquid nitrogen: Yes   Region frozen until ice ball extended beyond lesion: Yes   Outcome: patient tolerated procedure well with no complications   Post-procedure details: wound care instructions given    Skin cancer screening  Lentigines - Scattered tan macules - Due to sun exposure - Benign-appearing, observe - Recommend daily broad spectrum sunscreen SPF 30+ to sun-exposed areas, reapply every 2 hours as needed. - Call for any changes  Seborrheic Keratoses - Stuck-on, waxy, tan-brown papules and/or plaques  - Benign-appearing - Discussed benign etiology and prognosis. - Observe - Call for any changes  Melanocytic Nevi - Tan-brown and/or pink-flesh-colored symmetric macules and papules - Benign appearing on exam today - Observation - Call clinic for new or changing moles - Recommend daily use of broad spectrum spf 30+ sunscreen to sun-exposed areas.   Hemangiomas - Red papules - Discussed benign nature - Observe - Call for any changes  Actinic Damage - Severe, confluent actinic changes with pre-cancerous actinic keratoses  - Severe, chronic, not at goal, secondary to cumulative UV radiation exposure over time - diffuse scaly erythematous macules and papules with underlying dyspigmentation - Discussed Prescription "Field Treatment" for Severe, Chronic Confluent Actinic Changes with Pre-Cancerous Actinic Keratoses Field treatment involves treatment of an entire area of skin that has confluent Actinic Changes (Sun/ Ultraviolet light damage) and PreCancerous Actinic Keratoses by method of PhotoDynamic Therapy (PDT) and/or prescription Topical Chemotherapy agents such as 5-fluorouracil, 5-fluorouracil/calcipotriene, and/or imiquimod.  The purpose is to decrease the number of clinically evident and subclinical PreCancerous lesions to prevent progression to development of skin cancer by chemically destroying early precancer changes that may or may not be visible.  It has been  shown to reduce the risk of developing skin cancer in the treated area. As a result of treatment, redness, scaling, crusting, and open sores may occur during treatment course. One or more than one of these methods may be used and may have to be used several times to control, suppress and eliminate the PreCancerous changes. Discussed treatment course, expected reaction, and possible side effects. - Recommend daily broad spectrum sunscreen SPF 30+ to sun-exposed areas, reapply every 2 hours as needed.  - Staying in the shade or wearing long sleeves, sun glasses (UVA+UVB protection) and wide brim hats (4-inch brim around the entire circumference of the hat) are also recommended. - Call for new or changing lesions. - Start 5FU/Calcipotriene cream BID x 1 week on the nose.   Skin cancer screening performed today.  Return in about 3 months (around 11/27/2021) for ISK and AK follow up .  Luther Redo, CMA, am acting as scribe for Sarina Ser, MD . Documentation: I have reviewed the above documentation for accuracy and completeness, and I agree with the above.  Sarina Ser, MD

## 2021-08-29 ENCOUNTER — Ambulatory Visit: Payer: Medicare Other

## 2021-08-29 ENCOUNTER — Encounter: Payer: Self-pay | Admitting: Dermatology

## 2021-08-29 DIAGNOSIS — R2681 Unsteadiness on feet: Secondary | ICD-10-CM | POA: Diagnosis not present

## 2021-08-29 DIAGNOSIS — R278 Other lack of coordination: Secondary | ICD-10-CM

## 2021-08-29 DIAGNOSIS — M6281 Muscle weakness (generalized): Secondary | ICD-10-CM

## 2021-08-29 DIAGNOSIS — R262 Difficulty in walking, not elsewhere classified: Secondary | ICD-10-CM

## 2021-08-29 NOTE — Therapy (Signed)
OUTPATIENT PHYSICAL THERAPY TREATMENT NOTE   Patient Name: Nicholas [name] MRN: [id number] DOB:[birth year], 76 y.o., male Today's Date: 08/29/2021  PCP: Rusty Aus, MD REFERRING PROVIDER: Rusty Aus, MD   PT End of Session - 08/29/21 1051     Visit Number 48    Number of Visits 85    Date for PT Re-Evaluation 08/29/21    Authorization Type 8/10 PN 3/13    Authorization Time Period 03/14/2021-06/06/2021    Progress Note Due on Visit 20    PT Start Time 1100    PT Stop Time 1145    PT Time Calculation (min) 45 min    Equipment Utilized During Treatment Gait belt    Activity Tolerance Patient tolerated treatment well;No increased pain    Behavior During Therapy [facility] for tasks assessed/performed             Past Medical History:  Diagnosis Date   A-fib ([place])    Cancer ([place]) 10/25/2019   Prostate   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   Dysrhythmia    Pulmonary embolism ([facility])    Skin cancer    TIA (transient ischemic attack)    Past Surgical History:  Procedure Laterality Date   APPENDECTOMY     COLONOSCOPY WITH PROPOFOL N/A 11/23/2019   Procedure: COLONOSCOPY WITH PROPOFOL;  Surgeon: Toledo, Benay Pike, MD;  Location: [facility] ENDOSCOPY;  Service: Gastroenterology;  Laterality: N/A;   cyber knife surgery     PROSTATE SURGERY     Patient Active Problem List   Diagnosis Date Noted   COVID-19 virus infection 02/17/2021   A-fib ([place])    TIA (transient ischemic attack)    Lactic acidosis    Hyponatremia    Hypotension    Generalized weakness     REFERRING DIAG: Other symptoms and signs involving the musculoskeletal system  THERAPY DIAG:  Unsteadiness on feet  Difficulty in walking, not elsewhere classified  Muscle weakness (generalized)  Other lack of coordination  Rationale for Evaluation and Treatment Rehabilitation  PERTINENT HISTORY: Pt is a 76 y/o male, returning to PT following admission to ED for evaluation of weakness s/p fall where pt was found  to be COVID positive. Pt was in the hospital from 02/17/2021-02/23/2021. Pt was then d/c to home where he received [place] PT. Pt reports increased LBP since d/c from hospital and decreased balance since last in outpatient PT. While pt using QC currently, he reports he has been using a RW at home or grabbing onto the wall to steady himself while walking. Pt reports he has been walking home distances. PMH significant for TIA, a-fib, pulmonary embolism, TIA, gout, rhabdomyolysis.   PRECAUTIONS: fall risk  SUBJECTIVE: Pt reports back has been doing "a little bit better" but has not been able to walk as much recently.  PAIN:  Are you having pain? No     TODAY'S TREATMENT:  08/29/21 - gait belt donned and CGA provided throughout unless otherwise noted  TherEx:   Physioball rollouts: FWD & LTL, 20x each with 5 sec hold  Treadmill endurance HIIT training: @ 2% elevation: base speed 1.7 mph to 2.5 mph for 30 sec intervals - 5 min 23 sec total, with 1 min warm-up and 30 sec cool down at 1.4 mph   STS: 2 x 13, minor LOB noted when standing up too fast, VC for controlling speed of movement  NMR:   In // bars:  Airex: - NBOS, EO: 60 sec - WBOS, EC: 2 x 30  sec, intermittent UE support - Tandem stance: 2 x 30 sec each LE, noted inc difficulty for pt - Tandem stance with shooting basketballs: 1 round each LE, intermittent UE support  FWD tandem walking & large BKWD steps in // bars: 5x each, intermittent UUE support, occ VC for sequencing   LTL stepping on airex balance beam: 5x each direction, intermittent UE support   Alt FWD lunge step onto half bosu (round side up): 2 x 10 each side, intermittent UE support  LTL lunge step onto half bosu (round side up): 10x each side, intermittent UE supports, reports slight discomfort in L knee when stepping onto bosu with LLE  Static surface: - NBOS, ball toss: 2 x 90 sec, varying passes to encourage reaching in various directions while maintaining  balance      PATIENT EDUCATION: Education details: Pt educated throughout session about proper posture and technique with exercises. Improved exercise technique, movement at target joints, use of target muscles after min to mod verbal, visual, tactile cues.  Person educated: Patient Education method: Explanation, Demonstration, and Verbal cues Education comprehension: verbalized understanding, returned demonstration, and verbal cues required   HOME EXERCISE PROGRAM: 1/9: Access Code: [email or web address]; 4/27: Access Code: [email or web address]; no updates as of 08/29/21   PT Short Term Goals       PT SHORT TERM GOAL #1   Title Patient will be independent in home exercise program to improve balance, and strength/mobility for better functional independence with ADLs and decreased fall risk.    Baseline 03/14/21: to be initiated within next 1-2 sessions 2/6: HEP compliance    Time 6    Period Weeks    Status Achieved    Target Date 06/06/21      PT SHORT TERM GOAL #2   Title Pt will improve gross BLE strength to 5/5 in order to increase ease and safety with mobility and ADLs.    Baseline 03/14/21: strength 4+/5 B, greatest deficit in B ankle DFs 4-/5 B 2/6: grossly 4+/5 with df 4/5 3/13: see note; 5/22: grossly 4+/5 bilat, weakness still most prominent in PF    Time 6    Period Weeks    Status Partially Met    Target Date 09/09/21              PT Long Term Goals       PT LONG TERM GOAL #1   Title Patient will increase FOTO score to greater than 59 to demonstrate statistically significant improvement in mobility and quality of life.    Baseline 03/14/21: 58 2/6: 57% 3/13: 57.4%; 3/30: 62    Time 12    Period Weeks    Status Achieved    Target Date 06/06/21      PT LONG TERM GOAL #2   Title Patient (> 53 years old) will complete five times sit to stand test in < 10 seconds indicating increased LE strength and decreased fall risk    Baseline 03/14/21: 12 seconds 2/6: 12 seconds 3/13: 13.8 seconds  without hands; 3/30: 13 sec; 4/1; 5/22: 12 sec hands free    Time 12    Period Weeks    Status Partially Met    Target Date 08/29/21      PT LONG TERM GOAL #3   Title Pt will improve DGI by at least 3 points in order to demonstrate clinically significant improvement in balance and decreased risk for falls.    Baseline 03/14/21: 15/24 2/6: 17/24 3/13: 19/24; 3/30: 21/24  Time 12    Period Weeks    Status Achieved    Target Date 06/06/21      PT LONG TERM GOAL #4   Title Pt will increase 6MWT by at least 57m(1653f in order to demonstrate clinically significant improvement in cardiopulmonary endurance and community ambulation    Baseline 03/14/21: 795 ft with QC 2/6: 865 ft without AD 3/13: 770 ft with walking stick with knee pain; 3/30: 1056 ft    Time 12    Period Weeks    Status Achieved    Target Date 08/29/21      PT LONG TERM GOAL #5   Title The patient will be able to maintain >5 seconds of SLB on BLEs in order to decrease risk of falls when navigating obstacles, curbs and steps.    Baseline 03/14/21: RLE 4 seconds, LLE 2 seconds 2/6: L 3 seconds R 4 seconds 3/13: L 4 seconds with pain R 15 seconds; 3/30: L 4 sec R 8 sec; 5/22: 2 sec RLE, 3 sec LLE    Time 12    Period Weeks    Status On-going    Target Date 08/29/21      PT LONG TERM GOAL #6   Title Patient will increase 10 meter walk test to at least 1.0 m/s with LRD as to improve gait speed for better community ambulation and to reduce fall risk.    Baseline 03/14/21: 0.67 m/s 2/6: 1.3 m/s    Time 12    Period Weeks    Status Achieved    Target Date 06/06/21              Plan     Clinical Impression Statement Started session with lumbar mobility secondary to pt reports of slight stiffness in lower back. Progressed dynamic balance interventions with SLB progressions and ambulation over compliant surfaces. Pt with minor LOB throughout, however, the pt was able to self correct and improved with practice. The pt continues  to benefit from further skilled PT to improve strength, balance, and mobility to decrease fall risk.    Personal Factors and Comorbidities Age;Past/Current Experience;Comorbidity 3+    Comorbidities A-fib, lumbar disc disease, prostate CA, TIA, gout, hx of pulmonary embolism    Examination-Activity Limitations Carry;Lift;Stand;Locomotion Level;Bend;Dressing;Reach Overhead;Squat;Transfers;Caring for Others;Stairs    Examination-Participation Restrictions Community Activity;Yard Work;Cleaning;Laundry;Shop;Church    Stability/Clinical Decision Making Evolving/Moderate complexity    Rehab Potential Good    PT Frequency 2x / week    PT Duration 12 weeks    PT Treatment/Interventions ADLs/Self Care Home Management;Biofeedback;Aquatic Therapy;Canalith Repostioning;Cryotherapy;Electrical Stimulation;Iontophoresis 19m7ml Dexamethasone;Moist Heat;Traction;Ultrasound;Gait training;Stair training;DME Instruction;Functional mobility training;Therapeutic activities;Therapeutic exercise;Balance training;Neuromuscular re-education;Patient/family education;Manual techniques;Passive range of motion;Dry needling;Energy conservation;Vestibular;Joint Manipulations;Orthotic Fit/Training;Splinting;Taping;Visual/perceptual remediation/compensation    PT Next Visit Plan strengthening, mobility, gait, balance, continue with POC    PT Home Exercise Plan 1/9: Access Code: [email or web address]: Access Code: [email or web address] updates    Consulted and Agree with Plan of Care Patient             NicIzola PricePT  This entire session was performed under direct supervision and direction of a licensed therapist/therapist assistant . I have personally read, edited and approve of the note as written. HalRicard Dillon, DPT   NicIzola Pricetudent-PT 08/29/2021, 2:02 PM

## 2021-09-02 ENCOUNTER — Ambulatory Visit: Payer: Medicare Other

## 2021-09-02 DIAGNOSIS — R2681 Unsteadiness on feet: Secondary | ICD-10-CM | POA: Diagnosis not present

## 2021-09-02 DIAGNOSIS — M545 Low back pain, unspecified: Secondary | ICD-10-CM

## 2021-09-02 DIAGNOSIS — R262 Difficulty in walking, not elsewhere classified: Secondary | ICD-10-CM

## 2021-09-02 DIAGNOSIS — R278 Other lack of coordination: Secondary | ICD-10-CM

## 2021-09-02 DIAGNOSIS — M6281 Muscle weakness (generalized): Secondary | ICD-10-CM

## 2021-09-05 ENCOUNTER — Ambulatory Visit: Payer: Medicare Other

## 2021-09-05 DIAGNOSIS — M6281 Muscle weakness (generalized): Secondary | ICD-10-CM

## 2021-09-05 DIAGNOSIS — R2681 Unsteadiness on feet: Secondary | ICD-10-CM

## 2021-09-05 DIAGNOSIS — R278 Other lack of coordination: Secondary | ICD-10-CM

## 2021-09-05 DIAGNOSIS — G8929 Other chronic pain: Secondary | ICD-10-CM

## 2021-09-05 DIAGNOSIS — R262 Difficulty in walking, not elsewhere classified: Secondary | ICD-10-CM

## 2021-09-05 NOTE — Therapy (Signed)
OUTPATIENT PHYSICAL THERAPY TREATMENT NOTE/Physical Therapy Progress Note   Dates of reporting period  07/29/2021   to   09/05/2021    Patient Name: Nicholas [name] MRN: [id number] DOB:[birth year], 76 y.o., male Today's Date: 09/05/2021  PCP: Rusty Aus, MD REFERRING PROVIDER: Rusty Aus, MD   PT End of Session - 09/05/21 1102     Visit Number 50    Number of Visits 51    Date for PT Re-Evaluation 11/25/21    Progress Note Due on Visit 20    PT Start Time 1103    PT Stop Time 1148    PT Time Calculation (min) 45 min    Equipment Utilized During Treatment Gait belt    Activity Tolerance Patient tolerated treatment well;No increased pain    Behavior During Therapy [facility] for tasks assessed/performed               Past Medical History:  Diagnosis Date   A-fib ([place])    Cancer ([place]) 10/25/2019   Prostate   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   Dysrhythmia    Pulmonary embolism ([facility])    Skin cancer    TIA (transient ischemic attack)    Past Surgical History:  Procedure Laterality Date   APPENDECTOMY     COLONOSCOPY WITH PROPOFOL N/A 11/23/2019   Procedure: COLONOSCOPY WITH PROPOFOL;  Surgeon: Toledo, Benay Pike, MD;  Location: [facility] ENDOSCOPY;  Service: Gastroenterology;  Laterality: N/A;   cyber knife surgery     PROSTATE SURGERY     Patient Active Problem List   Diagnosis Date Noted   COVID-19 virus infection 02/17/2021   A-fib ([place])    TIA (transient ischemic attack)    Lactic acidosis    Hyponatremia    Hypotension    Generalized weakness     REFERRING DIAG: Other symptoms and signs involving the musculoskeletal system  THERAPY DIAG:  Unsteadiness on feet  Muscle weakness (generalized)  Other lack of coordination  Difficulty in walking, not elsewhere classified  Chronic bilateral low back pain, unspecified whether sciatica present  Rationale for Evaluation and Treatment Rehabilitation  PERTINENT HISTORY: Pt is a 76 y/o male, returning to  PT following admission to ED for evaluation of weakness s/p fall where pt was found to be COVID positive. Pt was in the hospital from 02/17/2021-02/23/2021. Pt was then d/c to home where he received [place] PT. Pt reports increased LBP since d/c from hospital and decreased balance since last in outpatient PT. While pt using QC currently, he reports he has been using a RW at home or grabbing onto the wall to steady himself while walking. Pt reports he has been walking home distances. PMH significant for TIA, a-fib, pulmonary embolism, TIA, gout, rhabdomyolysis.   PRECAUTIONS: fall risk  SUBJECTIVE: Pt reports he did not sleep well secondary to B feet burning. Pt reports back is feeling "alright right now, was bothering me last night." Pt and pt's spouse reports his nose has been getting worse since procedure and feels the prescription cream is not helping, PT instructs pt to reach out to physician, pt verbalizes understanding and agrees.  PAIN:  Are you having pain? No     TODAY'S TREATMENT:  Goals reassessed last visit. Refer to note from 09/02/21 for goal reassessment  - Gait belt donned and CGA provided throughout unless otherwise noted  TherEx:   Physioball rollouts: 10x with 3 sec hold each direction, FWD & side-to-side  In Wellzone: - Leg press: #6, 10x - Hamstring  curl: #5, 10x - Leg extension: #3, 10x - TRX mini squats: 6x - Lat pulldown: #2, 8x - Rows: #2, 6x   NMR:  In // bars on airex pad: - NBOS, EO: 30 sec - NBOS, head turns: horizontal & vertical, multiple reps each direction - Tandem: 30 sec each direction Comments: overall increased instability, more difficult with head turns    PATIENT EDUCATION:  Education details: exercise technique, body mechanics, wellzone machines & programs, plan Person educated: Patient Education method: Explanation, Demonstration, and Verbal cues Education comprehension: verbalized understanding, returned demonstration, and verbal cues  required   HOME EXERCISE PROGRAM: No updates as of 09/05/21  1/9: Access Code: [email or web address]; 4/27: Access Code: [email or web address]   PT Short Term Goals       PT SHORT TERM GOAL #1   Title Patient will be independent in home exercise program to improve balance, and strength/mobility for better functional independence with ADLs and decreased fall risk.    Baseline 03/14/21: to be initiated within next 1-2 sessions 2/6: HEP compliance    Time 6    Period Weeks    Status Achieved    Target Date 06/06/21      PT SHORT TERM GOAL #2   Title Pt will improve gross BLE strength to 5/5 in order to increase ease and safety with mobility and ADLs.    Baseline 03/14/21: strength 4+/5 B, greatest deficit in B ankle DFs 4-/5 B 2/6: grossly 4+/5 with df 4/5 3/13: see note; 5/22: grossly 4+/5 bilat, weakness still most prominent in PF, 6/26: grossly 5/5, weakness prominent in DF (4-/5)   Time 6    Period Weeks    Status Partially Met    Target Date 09/09/21              PT Long Term Goals       PT LONG TERM GOAL #1   Title Patient will increase FOTO score to greater than 59 to demonstrate statistically significant improvement in mobility and quality of life.    Baseline 03/14/21: 58 2/6: 57% 3/13: 57.4%; 3/30: 62    Time 12    Period Weeks    Status Achieved    Target Date 06/06/21      PT LONG TERM GOAL #2   Title Patient (> 42 years old) will complete five times sit to stand test in < 10 seconds indicating increased LE strength and decreased fall risk    Baseline 03/14/21: 12 seconds 2/6: 12 seconds 3/13: 13.8 seconds without hands; 3/30: 13 sec; 4/1; 5/22: 12 sec hands free, 6/26: 12 sec hands free   Time 12    Period Weeks    Status Partially Met    Target Date 11/25/2021      PT LONG TERM GOAL #3   Title Pt will improve DGI by at least 3 points in order to demonstrate clinically significant improvement in balance and decreased risk for falls.    Baseline 03/14/21: 15/24 2/6: 17/24 3/13: 19/24; 3/30:  21/24    Time 12    Period Weeks    Status Achieved    Target Date 06/06/21      PT LONG TERM GOAL #4   Title Pt will increase 6MWT by at least 66m(1681f in order to demonstrate clinically significant improvement in cardiopulmonary endurance and community ambulation    Baseline 03/14/21: 795 ft with QC 2/6: 865 ft without AD 3/13: 770 ft with walking stick with knee pain; 3/30: 1056 ft  Time 12    Period Weeks    Status Achieved    Target Date 08/29/21      PT LONG TERM GOAL #5   Title The patient will be able to maintain >5 seconds of SLB on BLEs in order to decrease risk of falls when navigating obstacles, curbs and steps.    Baseline 03/14/21: RLE 4 seconds, LLE 2 seconds 2/6: L 3 seconds R 4 seconds 3/13: L 4 seconds with pain R 15 seconds; 3/30: L 4 sec R 8 sec; 5/22: 2 sec RLE, 3 sec LLE; 6/26: LLE 11 sec, RLE 7 sec   Time 12    Period Weeks    Status MET   Target Date 08/29/21      PT LONG TERM GOAL #6   Title Patient will increase 10 meter walk test to at least 1.0 m/s with LRD as to improve gait speed for better community ambulation and to reduce fall risk.    Baseline 03/14/21: 0.67 m/s 2/6: 1.3 m/s    Time 12    Period Weeks    Status Achieved    Target Date 06/06/21     PT LONG TERM GOAL #7  Title Pt will improve bilateral DF strength to 5/5 in order to increase ease and safety with mobility and ADLs.  Baseline 6/26: 4-/5  Time 12   Period Weeks   Status New  Target Date 11/25/21                 Plan     Clinical Impression Statement PT and SPT discussed reducing frequency down to 1x/week with patient and introduced him to the [place] today. Utilized various weight machines for strengthening interventions while in the [place] (see treatment for details). Ended session with balance exercises, pt had more difficulty today but continues to show progress with decrease use of UE support. Pt was hesitant but agreeable to reduction in frequency and working out at  the [employer] the other day of the week, allowing for therapy to focus more on improving balance. The pt continues to benefit from further skilled PT to improve balance and mobility to decrease fall risk, and improve QoL.   Personal Factors and Comorbidities Age;Past/Current Experience;Comorbidity 3+    Comorbidities A-fib, lumbar disc disease, prostate CA, TIA, gout, hx of pulmonary embolism    Examination-Activity Limitations Carry;Lift;Stand;Locomotion Level;Bend;Dressing;Reach Overhead;Squat;Transfers;Caring for Others;Stairs    Examination-Participation Restrictions Community Activity;Yard Work;Cleaning;Laundry;Shop;Church    Stability/Clinical Decision Making Evolving/Moderate complexity    Rehab Potential Good    PT Frequency 2x / week    PT Duration 12 weeks    PT Treatment/Interventions ADLs/Self Care Home Management;Biofeedback;Aquatic Therapy;Canalith Repostioning;Cryotherapy;Electrical Stimulation;Iontophoresis 41m/ml Dexamethasone;Moist Heat;Traction;Ultrasound;Gait training;Stair training;DME Instruction;Functional mobility training;Therapeutic activities;Therapeutic exercise;Balance training;Neuromuscular re-education;Patient/family education;Manual techniques;Passive range of motion;Dry needling;Energy conservation;Vestibular;Joint Manipulations;Orthotic Fit/Training;Splinting;Taping;Visual/perceptual remediation/compensation    PT Next Visit Plan Dynamic & static balance, transfers to/from floor, continue POC    PT Home Exercise Plan 1/9: Access Code: [email or web address] 4/27: Access Code: [email or web address] no updates    Consulted and Agree with Plan of Care Patient             NIzola Price SPT  This entire session was performed under direct supervision and direction of a licensed therapist/therapist assistant . I have personally read, edited and approve of the note as written. HRicard DillonPT, DPT    HZollie Pee PT 09/05/2021, 4:17 PM

## 2021-09-09 ENCOUNTER — Ambulatory Visit: Payer: Medicare Other

## 2021-09-12 ENCOUNTER — Ambulatory Visit: Payer: Medicare Other | Attending: Internal Medicine

## 2021-09-12 DIAGNOSIS — R278 Other lack of coordination: Secondary | ICD-10-CM | POA: Insufficient documentation

## 2021-09-12 DIAGNOSIS — M6281 Muscle weakness (generalized): Secondary | ICD-10-CM | POA: Insufficient documentation

## 2021-09-12 DIAGNOSIS — R2681 Unsteadiness on feet: Secondary | ICD-10-CM | POA: Insufficient documentation

## 2021-09-12 DIAGNOSIS — M25562 Pain in left knee: Secondary | ICD-10-CM | POA: Insufficient documentation

## 2021-09-12 DIAGNOSIS — R262 Difficulty in walking, not elsewhere classified: Secondary | ICD-10-CM | POA: Diagnosis present

## 2021-09-12 NOTE — Therapy (Signed)
OUTPATIENT PHYSICAL THERAPY TREATMENT NOTE    Patient Name: Nicholas [name] MRN: [id number] DOB:[birth year], 76 y.o., male Today's Date: 09/12/2021  PCP: Rusty Aus, MD REFERRING PROVIDER: Rusty Aus, MD   PT End of Session - 09/12/21 1055     Visit Number 51    Number of Visits 21    Date for PT Re-Evaluation 11/25/21    Progress Note Due on Visit 20    PT Start Time 1055    PT Stop Time 1150    PT Time Calculation (min) 55 min    Equipment Utilized During Treatment Gait belt    Activity Tolerance Patient tolerated treatment well;No increased pain    Behavior During Therapy [facility] for tasks assessed/performed                Past Medical History:  Diagnosis Date   A-fib ([place])    Cancer ([place]) 10/25/2019   Prostate   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   Dysrhythmia    Pulmonary embolism (HCC)    Skin cancer    TIA (transient ischemic attack)    Past Surgical History:  Procedure Laterality Date   APPENDECTOMY     COLONOSCOPY WITH PROPOFOL N/A 11/23/2019   Procedure: COLONOSCOPY WITH PROPOFOL;  Surgeon: Toledo, Benay Pike, MD;  Location: [facility] ENDOSCOPY;  Service: Gastroenterology;  Laterality: N/A;   cyber knife surgery     PROSTATE SURGERY     Patient Active Problem List   Diagnosis Date Noted   COVID-19 virus infection 02/17/2021   A-fib ([place])    TIA (transient ischemic attack)    Lactic acidosis    Hyponatremia    Hypotension    Generalized weakness     REFERRING DIAG: Other symptoms and signs involving the musculoskeletal system  THERAPY DIAG:  Unsteadiness on feet  Muscle weakness (generalized)  Difficulty in walking, not elsewhere classified  Other lack of coordination  Acute pain of left knee  Rationale for Evaluation and Treatment Rehabilitation  PERTINENT HISTORY: Pt is a 76 y/o male, returning to PT following admission to ED for evaluation of weakness s/p fall where pt was found to be COVID positive. Pt was in the hospital from  02/17/2021-02/23/2021. Pt was then d/c to home where he received [facility]. Pt reports increased LBP since d/c from hospital and decreased balance since last in outpatient PT. While pt using QC currently, he reports he has been using a RW at home or grabbing onto the wall to steady himself while walking. Pt reports he has been walking home distances. PMH significant for TIA, a-fib, pulmonary embolism, TIA, gout, rhabdomyolysis.   PRECAUTIONS: fall risk  SUBJECTIVE: Pt reports he worked out in the [employer] twice this week and legs are a little fatigued. Reports doing various exercises for BUE and BLE. Denies pain but states back is "a little stiff". Pt reports he would benefit from an HEP to guide him when in the [place].  PAIN:  Are you having pain? No     TODAY'S TREATMENT: 09/12/2021 - Gait belt donned and CGA provided throughout unless otherwise noted  Rising from floor to stand on red mat: 3x, VC/demo for technique  In [employer]: - Mid row 1x12 1# - Lat pulldown 1x12 1#, VC for technique, noted pt difficulty getting bar down safely. Instructed pt to not perform this specific intervention on this machine until he is able to demo safe technique in PT. Pt verbalized understanding. - Shoulder press 1P50 1#, noted diffuliculty for pt  to get in & out of machine safely. Instructed pt to not perform this specific intervention on this machine until he is able to demo safe technique in PT. Pt verbalized understanding. - Hamstring curl 1x12 2# - Leg extension 1x12 2#, pt reports increase in L knee pain following exercise. Discussed with pt to hold off on using leg extension machine until he is able to perform pain-free in PT. Pt verbalized understanding. - Leg press 1x12 2# - Heel raise 1x12 2#   Pt reports knee pain resolved by end of session.   PATIENT EDUCATION:  Education details: exercise technique, body mechanics, [employer] HEP Person educated: Patient Education method: Explanation,  Demonstration, Verbal cues, and Handouts Education comprehension: verbalized understanding, returned demonstration, and verbal cues required   HOME EXERCISE PROGRAM: 09/12/21: [employer] HEP - Seated Row 2 sets of 15 reps at a weight that feels like a moderate challenge 2. Seated hamstring curls 2 sets of 15 reps at a weight that feels like a moderate challenge 3. Seated leg press 2 sets of 15 reps at a weight that feels like a moderate challenge 4. Seated leg press heel raises 2 sets of 15 reps at a weight that feels like a moderate challenge Pictures of interventions also provided to pt in handout.  1/9: Access Code: [email or web address]; 4/27: Access Code: [email or web address]   PT Short Term Goals       PT SHORT TERM GOAL #1   Title Patient will be independent in home exercise program to improve balance, and strength/mobility for better functional independence with ADLs and decreased fall risk.    Baseline 03/14/21: to be initiated within next 1-2 sessions 2/6: HEP compliance    Time 6    Period Weeks    Status Achieved    Target Date 06/06/21      PT SHORT TERM GOAL #2   Title Pt will improve gross BLE strength to 5/5 in order to increase ease and safety with mobility and ADLs.    Baseline 03/14/21: strength 4+/5 B, greatest deficit in B ankle DFs 4-/5 B 2/6: grossly 4+/5 with df 4/5 3/13: see note; 5/22: grossly 4+/5 bilat, weakness still most prominent in PF, 6/26: grossly 5/5, weakness prominent in DF (4-/5)   Time 6    Period Weeks    Status Partially Met    Target Date 09/09/21              PT Long Term Goals       PT LONG TERM GOAL #1   Title Patient will increase FOTO score to greater than 59 to demonstrate statistically significant improvement in mobility and quality of life.    Baseline 03/14/21: 58 2/6: 57% 3/13: 57.4%; 3/30: 62    Time 12    Period Weeks    Status Achieved    Target Date 06/06/21      PT LONG TERM GOAL #2   Title Patient (> 85 years old) will complete five times sit  to stand test in < 10 seconds indicating increased LE strength and decreased fall risk    Baseline 03/14/21: 12 seconds 2/6: 12 seconds 3/13: 13.8 seconds without hands; 3/30: 13 sec; 4/1; 5/22: 12 sec hands free, 6/26: 12 sec hands free   Time 12    Period Weeks    Status Partially Met    Target Date 11/25/2021      PT LONG TERM GOAL #3   Title Pt will improve DGI by at least 3 points in  order to demonstrate clinically significant improvement in balance and decreased risk for falls.    Baseline 03/14/21: 15/24 2/6: 17/24 3/13: 19/24; 3/30: 21/24    Time 12    Period Weeks    Status Achieved    Target Date 06/06/21      PT LONG TERM GOAL #4   Title Pt will increase 6MWT by at least 83m(1651f in order to demonstrate clinically significant improvement in cardiopulmonary endurance and community ambulation    Baseline 03/14/21: 795 ft with QC 2/6: 865 ft without AD 3/13: 770 ft with walking stick with knee pain; 3/30: 1056 ft    Time 12    Period Weeks    Status Achieved    Target Date 08/29/21      PT LONG TERM GOAL #5   Title The patient will be able to maintain >5 seconds of SLB on BLEs in order to decrease risk of falls when navigating obstacles, curbs and steps.    Baseline 03/14/21: RLE 4 seconds, LLE 2 seconds 2/6: L 3 seconds R 4 seconds 3/13: L 4 seconds with pain R 15 seconds; 3/30: L 4 sec R 8 sec; 5/22: 2 sec RLE, 3 sec LLE; 6/26: LLE 11 sec, RLE 7 sec   Time 12    Period Weeks    Status MET   Target Date 08/29/21      PT LONG TERM GOAL #6   Title Patient will increase 10 meter walk test to at least 1.0 m/s with LRD as to improve gait speed for better community ambulation and to reduce fall risk.    Baseline 03/14/21: 0.67 m/s 2/6: 1.3 m/s    Time 12    Period Weeks    Status Achieved    Target Date 06/06/21     PT LONG TERM GOAL #7  Title Pt will improve bilateral DF strength to 5/5 in order to increase ease and safety with mobility and ADLs.  Baseline 6/26: 4-/5  Time 12    Period Weeks   Status New  Target Date 11/25/21                 Plan     Clinical Impression Statement Focused on technique when rising from the floor at start of session today, pt able to successfully execute with additional verbal cues for proximity to support surface prior to raising up. PT and SPT discussed using today session to formulate an HEP for the [facility]pt agreed and expressed desire to have a set program. Pt demonstrated difficulties with safely getting onto and off of certain machines, resulting in those not being included in HEP (see note for details). Pt demonstrated and verbalized understanding of HEP prior to leaving session today. The pt continues to benefit from further skilled PT to improve balance and mobility to decrease fall risk, and improve QoL.    Personal Factors and Comorbidities Age;Past/Current Experience;Comorbidity 3+    Comorbidities A-fib, lumbar disc disease, prostate CA, TIA, gout, hx of pulmonary embolism    Examination-Activity Limitations Carry;Lift;Stand;Locomotion Level;Bend;Dressing;Reach Overhead;Squat;Transfers;Caring for Others;Stairs    Examination-Participation Restrictions Community Activity;Yard Work;Cleaning;Laundry;Shop;Church    Stability/Clinical Decision Making Evolving/Moderate complexity    Rehab Potential Good    PT Frequency 2x / week    PT Duration 12 weeks    PT Treatment/Interventions ADLs/Self Care Home Management;Biofeedback;Aquatic Therapy;Canalith Repostioning;Cryotherapy;Electrical Stimulation;Iontophoresis 35m69ml Dexamethasone;Moist Heat;Traction;Ultrasound;Gait training;Stair training;DME Instruction;Functional mobility training;Therapeutic activities;Therapeutic exercise;Balance training;Neuromuscular re-education;Patient/family education;Manual techniques;Passive range of motion;Dry needling;Energy conservation;Vestibular;Joint Manipulations;Orthotic Fit/Training;Splinting;Taping;Visual/perceptual  remediation/compensation  PT Next Visit Plan Dynamic & static balance, transfers to/from floor, continue POC        Consulted and Agree with Plan of Care Patient             Izola Price, SPT  This entire session was performed under direct supervision and direction of a licensed therapist. I have personally read, edited and approve of the note as written. Ricard Dillon PT, DPT     Zollie Pee, PT 09/12/2021, 4:34 PM

## 2021-09-16 ENCOUNTER — Ambulatory Visit: Payer: Medicare Other

## 2021-09-19 ENCOUNTER — Ambulatory Visit: Payer: Medicare Other

## 2021-09-19 DIAGNOSIS — R262 Difficulty in walking, not elsewhere classified: Secondary | ICD-10-CM

## 2021-09-19 DIAGNOSIS — R2681 Unsteadiness on feet: Secondary | ICD-10-CM

## 2021-09-19 DIAGNOSIS — R278 Other lack of coordination: Secondary | ICD-10-CM

## 2021-09-19 DIAGNOSIS — M6281 Muscle weakness (generalized): Secondary | ICD-10-CM

## 2021-09-19 NOTE — Therapy (Signed)
OUTPATIENT PHYSICAL THERAPY TREATMENT NOTE    Patient Name: Nicholas [name] MRN: [id number] DOB:September 06, 1945, 76 y.o., male Today's Date: 09/19/2021  PCP: Rusty Aus, MD REFERRING PROVIDER: Rusty Aus, MD   PT End of Session - 09/19/21 0849     Visit Number 52    Number of Visits 71    Date for PT Re-Evaluation 11/25/21    Progress Note Due on Visit 20    PT Start Time 0850    PT Stop Time 0933    PT Time Calculation (min) 43 min    Equipment Utilized During Treatment Gait belt    Activity Tolerance Patient tolerated treatment well;No increased pain    Behavior During Therapy [facility] for tasks assessed/performed                 Past Medical History:  Diagnosis Date   A-fib ([place])    Cancer ([place]) 10/25/2019   Prostate   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   Dysrhythmia    Pulmonary embolism (H[facility])    Skin cancer    TIA (transient ischemic attack)    Past Surgical History:  Procedure Laterality Date   APPENDECTOMY     COLONOSCOPY WITH PROPOFOL N/A 11/23/2019   Procedure: COLONOSCOPY WITH PROPOFOL;  Surgeon: Toledo, Benay Pike, MD;  Location: [facility] ENDOSCOPY;  Service: Gastroenterology;  Laterality: N/A;   cyber knife surgery     PROSTATE SURGERY     Patient Active Problem List   Diagnosis Date Noted   COVID-19 virus infection 02/17/2021   A-fib ([place])    TIA (transient ischemic attack)    Lactic acidosis    Hyponatremia    Hypotension    Generalized weakness     REFERRING DIAG: Other symptoms and signs involving the musculoskeletal system  THERAPY DIAG:  Unsteadiness on feet  Muscle weakness (generalized)  Difficulty in walking, not elsewhere classified  Other lack of coordination  Rationale for Evaluation and Treatment Rehabilitation  PERTINENT HISTORY: Pt is a 76 y/o male, returning to PT following admission to ED for evaluation of weakness s/p fall where pt was found to be COVID positive. Pt was in the hospital from 02/17/2021-02/23/2021. Pt  was then d/c to home where he received [facility] PT. Pt reports increased LBP since d/c from hospital and decreased balance since last in outpatient PT. While pt using QC currently, he reports he has been using a RW at home or grabbing onto the wall to steady himself while walking. Pt reports he has been walking home distances. PMH significant for TIA, a-fib, pulmonary embolism, TIA, gout, rhabdomyolysis.   PRECAUTIONS: fall risk  SUBJECTIVE: Pt reports R knee has still been bothering him since last session and has been using quad base cane periodically since. Pt states it was better yesterday, but overall has not been able to walk as much as a result. Pt reports no pain at rest today. Pt reports back is "a little stiff". Pt has continued to workout at [employer].  PAIN:  Are you having pain? No   TODAY'S TREATMENT:  09/19/2021 - Gait belt donned and CGA provided throughout unless otherwise noted  FWD physioball rollouts: 20x with 2-3 sec hold   LTL physioball rollouts: 20x with 2-3 sec hold each direction  Bosu FWD lunges: 10x each LE, intermittent UE support  Bosu LTL lunges: 10x each LE, intermittent UE support  Airex balance beam: - LTL stepping: 6x, length of // bars - Tandem gait: 10x, length of //bars  FWD/BKWD  step overs: 5x, 2 airex pads with orange hurdle sandwiched between  LTL step overs: 5x, 2 airex pads with orange hurdle sandwiched between   Pt reports no increase in knee pain at end of session.   PATIENT EDUCATION:  Education details: exercise technique, body mechanics Person educated: Patient Education method: Explanation, Demonstration, and Verbal cues Education comprehension: verbalized understanding, returned demonstration, verbal cues required, and needs further education   HOME EXERCISE PROGRAM: No changes on this date 09/19/2021 09/12/21: [employer] HEP - Seated Row 2 sets of 15 reps at a weight that feels like a moderate challenge 2. Seated hamstring curls 2  sets of 15 reps at a weight that feels like a moderate challenge 3. Seated leg press 2 sets of 15 reps at a weight that feels like a moderate challenge 4. Seated leg press heel raises 2 sets of 15 reps at a weight that feels like a moderate challenge Pictures of interventions also provided to pt in handout.  1/9: Access Code: [email or web address]; 4/27: Access Code: [email or web address]   PT Short Term Goals       PT SHORT TERM GOAL #1   Title Patient will be independent in home exercise program to improve balance, and strength/mobility for better functional independence with ADLs and decreased fall risk.    Baseline 03/14/21: to be initiated within next 1-2 sessions 2/6: HEP compliance    Time 6    Period Weeks    Status Achieved    Target Date 06/06/21      PT SHORT TERM GOAL #2   Title Pt will improve gross BLE strength to 5/5 in order to increase ease and safety with mobility and ADLs.    Baseline 03/14/21: strength 4+/5 B, greatest deficit in B ankle DFs 4-/5 B 2/6: grossly 4+/5 with df 4/5 3/13: see note; 5/22: grossly 4+/5 bilat, weakness still most prominent in PF, 6/26: grossly 5/5, weakness prominent in DF (4-/5)   Time 6    Period Weeks    Status Partially Met    Target Date 09/09/21              PT Long Term Goals       PT LONG TERM GOAL #1   Title Patient will increase FOTO score to greater than 59 to demonstrate statistically significant improvement in mobility and quality of life.    Baseline 03/14/21: 58 2/6: 57% 3/13: 57.4%; 3/30: 62    Time 12    Period Weeks    Status Achieved    Target Date 06/06/21      PT LONG TERM GOAL #2   Title Patient (> 34 years old) will complete five times sit to stand test in < 10 seconds indicating increased LE strength and decreased fall risk    Baseline 03/14/21: 12 seconds 2/6: 12 seconds 3/13: 13.8 seconds without hands; 3/30: 13 sec; 4/1; 5/22: 12 sec hands free, 6/26: 12 sec hands free   Time 12    Period Weeks    Status Partially Met    Target  Date 11/25/2021      PT LONG TERM GOAL #3   Title Pt will improve DGI by at least 3 points in order to demonstrate clinically significant improvement in balance and decreased risk for falls.    Baseline 03/14/21: 15/24 2/6: 17/24 3/13: 19/24; 3/30: 21/24    Time 12    Period Weeks    Status Achieved    Target Date 06/06/21      PT  LONG TERM GOAL #4   Title Pt will increase 6MWT by at least 14m (123ft) in order to demonstrate clinically significant improvement in cardiopulmonary endurance and community ambulation    Baseline 03/14/21: 795 ft with QC 2/6: 865 ft without AD 3/13: 770 ft with walking stick with knee pain; 3/30: 1056 ft    Time 12    Period Weeks    Status Achieved    Target Date 08/29/21      PT LONG TERM GOAL #5   Title The patient will be able to maintain >5 seconds of SLB on BLEs in order to decrease risk of falls when navigating obstacles, curbs and steps.    Baseline 03/14/21: RLE 4 seconds, LLE 2 seconds 2/6: L 3 seconds R 4 seconds 3/13: L 4 seconds with pain R 15 seconds; 3/30: L 4 sec R 8 sec; 5/22: 2 sec RLE, 3 sec LLE; 6/26: LLE 11 sec, RLE 7 sec   Time 12    Period Weeks    Status MET   Target Date 08/29/21      PT LONG TERM GOAL #6   Title Patient will increase 10 meter walk test to at least 1.0 m/s with LRD as to improve gait speed for better community ambulation and to reduce fall risk.    Baseline 03/14/21: 0.67 m/s 2/6: 1.3 m/s    Time 12    Period Weeks    Status Achieved    Target Date 06/06/21     PT LONG TERM GOAL #7  Title Pt will improve bilateral DF strength to 5/5 in order to increase ease and safety with mobility and ADLs.  Baseline 6/26: 4-/5  Time 12   Period Weeks   Status New  Target Date 11/25/21                 Plan     Clinical Impression Statement Pt pleasant and highly motivated for today's session, focusing on dynamic balance. Pt challenged by high level balance interventions, with increased instability noted. Pt required  intermittent UE support throughout to maintain balance and upright posture. SPT instructed pt on hip strategies when posterior LOB utilized, pt with improved strategies, however, still has difficult shifting weight forward off of heels. The pt continues to benefit from further skilled PT to improve balance and mobility to decrease fall risk and improve QoL.    Personal Factors and Comorbidities Age;Past/Current Experience;Comorbidity 3+    Comorbidities A-fib, lumbar disc disease, prostate CA, TIA, gout, hx of pulmonary embolism    Examination-Activity Limitations Carry;Lift;Stand;Locomotion Level;Bend;Dressing;Reach Overhead;Squat;Transfers;Caring for Others;Stairs    Examination-Participation Restrictions Community Activity;Yard Work;Cleaning;Laundry;Shop;Church    Stability/Clinical Decision Making Evolving/Moderate complexity    Rehab Potential Good    PT Frequency 2x / week    PT Duration 12 weeks    PT Treatment/Interventions ADLs/Self Care Home Management;Biofeedback;Aquatic Therapy;Canalith Repostioning;Cryotherapy;Electrical Stimulation;Iontophoresis 4mg /ml Dexamethasone;Moist Heat;Traction;Ultrasound;Gait training;Stair training;DME Instruction;Functional mobility training;Therapeutic activities;Therapeutic exercise;Balance training;Neuromuscular re-education;Patient/family education;Manual techniques;Passive range of motion;Dry needling;Energy conservation;Vestibular;Joint Manipulations;Orthotic Fit/Training;Splinting;Taping;Visual/perceptual remediation/compensation    PT Next Visit Plan Dynamic & static balance, transfers to/from floor, continue POC        Consulted and Agree with Plan of Care Patient             Izola Price, SPT This entire session was performed under direct supervision and direction of a licensed therapist. I have personally read, edited and approve of the note as written. Ricard Dillon PT, DPT    Zollie Pee, PT 09/19/2021, 1:29  PM

## 2021-09-23 ENCOUNTER — Ambulatory Visit: Payer: Medicare Other

## 2021-09-26 ENCOUNTER — Ambulatory Visit: Payer: Medicare Other

## 2021-09-26 DIAGNOSIS — R2681 Unsteadiness on feet: Secondary | ICD-10-CM | POA: Diagnosis not present

## 2021-09-26 DIAGNOSIS — R262 Difficulty in walking, not elsewhere classified: Secondary | ICD-10-CM

## 2021-09-26 DIAGNOSIS — M6281 Muscle weakness (generalized): Secondary | ICD-10-CM

## 2021-09-26 DIAGNOSIS — R278 Other lack of coordination: Secondary | ICD-10-CM

## 2021-09-26 NOTE — Therapy (Signed)
OUTPATIENT PHYSICAL THERAPY TREATMENT NOTE    Patient Name: Nicholas [name] MRN: [id number] DOB:1945-06-08, 76 y.o., male Today's Date: 09/26/2021  PCP: Rusty Aus, MD REFERRING PROVIDER: Rusty Aus, MD   PT End of Session - 09/26/21 1642     Visit Number 53    Number of Visits 73    Date for PT Re-Evaluation 11/25/21    PT Start Time 1103    PT Stop Time 1145    PT Time Calculation (min) 42 min    Equipment Utilized During Treatment Gait belt    Activity Tolerance Patient tolerated treatment well;No increased pain    Behavior During Therapy [facility] for tasks assessed/performed                  Past Medical History:  Diagnosis Date   A-fib ([place])    Cancer ([place]) 10/25/2019   Prostate   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   Dysrhythmia    Pulmonary embolism (H[facility])    Skin cancer    TIA (transient ischemic attack)    Past Surgical History:  Procedure Laterality Date   APPENDECTOMY     COLONOSCOPY WITH PROPOFOL N/A 11/23/2019   Procedure: COLONOSCOPY WITH PROPOFOL;  Surgeon: Toledo, Benay Pike, MD;  Location: [facility] ENDOSCOPY;  Service: Gastroenterology;  Laterality: N/A;   cyber knife surgery     PROSTATE SURGERY     Patient Active Problem List   Diagnosis Date Noted   COVID-19 virus infection 02/17/2021   A-fib ([place])    TIA (transient ischemic attack)    Lactic acidosis    Hyponatremia    Hypotension    Generalized weakness     REFERRING DIAG: Other symptoms and signs involving the musculoskeletal system  THERAPY DIAG:  Unsteadiness on feet  Muscle weakness (generalized)  Difficulty in walking, not elsewhere classified  Other lack of coordination  Rationale for Evaluation and Treatment Rehabilitation  PERTINENT HISTORY: Pt is a 76 y/o male, returning to PT following admission to ED for evaluation of weakness s/p fall where pt was found to be COVID positive. Pt was in the hospital from 02/17/2021-02/23/2021. Pt was then d/c to home where he  received [facility]. Pt reports increased LBP since d/c from hospital and decreased balance since last in outpatient PT. While pt using QC currently, he reports he has been using a RW at home or grabbing onto the wall to steady himself while walking. Pt reports he has been walking home distances. PMH significant for TIA, a-fib, pulmonary embolism, TIA, gout, rhabdomyolysis.   PRECAUTIONS: fall risk  SUBJECTIVE: Pt reports gym has been going well, legs are a little sore today. "Some days I feel more off balance than others". Pt reports no stumbles/falls since last visit.   PAIN:  Are you having pain? No   TODAY'S TREATMENT:  09/26/2021 - Gait belt donned and CGA provided throughout unless otherwise noted  Trunk rotation ball handoffs: 2 x 20 reps  Squatting: - Squat with hold for 3 sec: 15x - Depth squats: 2 x 10  Frequent education on technique and body mechanics throughout  LTL stepping on airex balance beam: 4x each direction  Tandem gait on airex balance beam: 8x, improved with repetitions  Bosu FWD lunges: 10x each LE, intermittent UE support   Pt reports no increase in knee pain at end of session.   PATIENT EDUCATION:  Education details: exercise technique, body mechanics Person educated: Patient Education method: Explanation, Demonstration, and Verbal cues Education comprehension: verbalized  understanding, returned demonstration, verbal cues required, and needs further education   HOME EXERCISE PROGRAM: 09/26/2021: No updates  09/12/21: [employer] HEP - Seated Row 2 sets of 15 reps at a weight that feels like a moderate challenge 2. Seated hamstring curls 2 sets of 15 reps at a weight that feels like a moderate challenge 3. Seated leg press 2 sets of 15 reps at a weight that feels like a moderate challenge 4. Seated leg press heel raises 2 sets of 15 reps at a weight that feels like a moderate challenge Pictures of interventions also provided to pt in handout.  1/9: Access  Code: [email or web address]; 4/27: Access Code: [email or web address]   PT Short Term Goals       PT SHORT TERM GOAL #1   Title Patient will be independent in home exercise program to improve balance, and strength/mobility for better functional independence with ADLs and decreased fall risk.    Baseline 03/14/21: to be initiated within next 1-2 sessions 2/6: HEP compliance    Time 6    Period Weeks    Status Achieved    Target Date 06/06/21      PT SHORT TERM GOAL #2   Title Pt will improve gross BLE strength to 5/5 in order to increase ease and safety with mobility and ADLs.    Baseline 03/14/21: strength 4+/5 B, greatest deficit in B ankle DFs 4-/5 B 2/6: grossly 4+/5 with df 4/5 3/13: see note; 5/22: grossly 4+/5 bilat, weakness still most prominent in PF, 6/26: grossly 5/5, weakness prominent in DF (4-/5)   Time 6    Period Weeks    Status Partially Met    Target Date 09/09/21              PT Long Term Goals       PT LONG TERM GOAL #1   Title Patient will increase FOTO score to greater than 59 to demonstrate statistically significant improvement in mobility and quality of life.    Baseline 03/14/21: 58 2/6: 57% 3/13: 57.4%; 3/30: 62    Time 12    Period Weeks    Status Achieved    Target Date 06/06/21      PT LONG TERM GOAL #2   Title Patient (> 19 years old) will complete five times sit to stand test in < 10 seconds indicating increased LE strength and decreased fall risk    Baseline 03/14/21: 12 seconds 2/6: 12 seconds 3/13: 13.8 seconds without hands; 3/30: 13 sec; 4/1; 5/22: 12 sec hands free, 6/26: 12 sec hands free   Time 12    Period Weeks    Status Partially Met    Target Date 11/25/2021      PT LONG TERM GOAL #3   Title Pt will improve DGI by at least 3 points in order to demonstrate clinically significant improvement in balance and decreased risk for falls.    Baseline 03/14/21: 15/24 2/6: 17/24 3/13: 19/24; 3/30: 21/24    Time 12    Period Weeks    Status Achieved    Target Date  06/06/21      PT LONG TERM GOAL #4   Title Pt will increase 6MWT by at least 9m(1651f in order to demonstrate clinically significant improvement in cardiopulmonary endurance and community ambulation    Baseline 03/14/21: 795 ft with QC 2/6: 865 ft without AD 3/13: 770 ft with walking stick with knee pain; 3/30: 1056 ft    Time 12  Period Weeks    Status Achieved    Target Date 08/29/21      PT LONG TERM GOAL #5   Title The patient will be able to maintain >5 seconds of SLB on BLEs in order to decrease risk of falls when navigating obstacles, curbs and steps.    Baseline 03/14/21: RLE 4 seconds, LLE 2 seconds 2/6: L 3 seconds R 4 seconds 3/13: L 4 seconds with pain R 15 seconds; 3/30: L 4 sec R 8 sec; 5/22: 2 sec RLE, 3 sec LLE; 6/26: LLE 11 sec, RLE 7 sec   Time 12    Period Weeks    Status MET   Target Date 08/29/21      PT LONG TERM GOAL #6   Title Patient will increase 10 meter walk test to at least 1.0 m/s with LRD as to improve gait speed for better community ambulation and to reduce fall risk.    Baseline 03/14/21: 0.67 m/s 2/6: 1.3 m/s    Time 12    Period Weeks    Status Achieved    Target Date 06/06/21     PT LONG TERM GOAL #7  Title Pt will improve bilateral DF strength to 5/5 in order to increase ease and safety with mobility and ADLs.  Baseline 6/26: 4-/5  Time 12   Period Weeks   Status New  Target Date 11/25/21                 Plan     Clinical Impression Statement Majority of today's session focused on squatting techniques secondary to pt reports of difficulties picking item off the floor. Pt reported feeling more comfortable squatting following practice. Balance improved with sidestepping with repetitions, noting decreased need for UE support. The pt continues to benefit from further skilled PT to improve balance and mobility to decrease fall risk and improve QoL.    Personal Factors and Comorbidities Age;Past/Current Experience;Comorbidity 3+     Comorbidities A-fib, lumbar disc disease, prostate CA, TIA, gout, hx of pulmonary embolism    Examination-Activity Limitations Carry;Lift;Stand;Locomotion Level;Bend;Dressing;Reach Overhead;Squat;Transfers;Caring for Others;Stairs    Examination-Participation Restrictions Community Activity;Yard Work;Cleaning;Laundry;Shop;Church    Stability/Clinical Decision Making Evolving/Moderate complexity    Rehab Potential Good    PT Frequency 2x / week    PT Duration 12 weeks    PT Treatment/Interventions ADLs/Self Care Home Management;Biofeedback;Aquatic Therapy;Canalith Repostioning;Cryotherapy;Electrical Stimulation;Iontophoresis 36m/ml Dexamethasone;Moist Heat;Traction;Ultrasound;Gait training;Stair training;DME Instruction;Functional mobility training;Therapeutic activities;Therapeutic exercise;Balance training;Neuromuscular re-education;Patient/family education;Manual techniques;Passive range of motion;Dry needling;Energy conservation;Vestibular;Joint Manipulations;Orthotic Fit/Training;Splinting;Taping;Visual/perceptual remediation/compensation    PT Next Visit Plan Dynamic & static balance, transfers to/from floor, continue POC        Consulted and Agree with Plan of Care Patient             NIzola Price SPT  HRicard DillonPT, DPT This entire session was performed under direct supervision and direction of a licensed therapist. I have personally read, edited and approve of the note as written.   HZollie Pee PT 09/26/2021, 5:09 PM

## 2021-09-30 ENCOUNTER — Ambulatory Visit: Payer: Medicare Other

## 2021-10-03 ENCOUNTER — Ambulatory Visit: Payer: Medicare Other

## 2021-10-03 DIAGNOSIS — R2681 Unsteadiness on feet: Secondary | ICD-10-CM | POA: Diagnosis not present

## 2021-10-03 DIAGNOSIS — M6281 Muscle weakness (generalized): Secondary | ICD-10-CM

## 2021-10-03 NOTE — Therapy (Signed)
OUTPATIENT PHYSICAL THERAPY TREATMENT NOTE    Patient Name: Nicholas [name] MRN: [id number] DOB:November 10, 1945, 76 y.o., male Today's Date: 10/04/2021  PCP: Rusty Aus, MD REFERRING PROVIDER: Rusty Aus, MD   PT End of Session - 10/04/21 [phone]     Visit Number 54    Number of Visits 73    Date for PT Re-Evaluation 11/25/21    PT Start Time 1102    PT Stop Time 1146    PT Time Calculation (min) 44 min    Equipment Utilized During Treatment Gait belt    Activity Tolerance Patient tolerated treatment well;No increased pain    Behavior During Therapy [facility] for tasks assessed/performed                   Past Medical History:  Diagnosis Date   A-fib ([place])    Cancer (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   Dysrhythmia    Pulmonary embolism (H[facility])    Skin cancer    TIA (transient ischemic attack)    Past Surgical History:  Procedure Laterality Date   APPENDECTOMY     COLONOSCOPY WITH PROPOFOL N/A 11/23/2019   Procedure: COLONOSCOPY WITH PROPOFOL;  Surgeon: Toledo, Benay Pike, MD;  Location: [facility] ENDOSCOPY;  Service: Gastroenterology;  Laterality: N/A;   cyber knife surgery     PROSTATE SURGERY     Patient Active Problem List   Diagnosis Date Noted   COVID-19 virus infection 02/17/2021   A-fib ([place])    TIA (transient ischemic attack)    Lactic acidosis    Hyponatremia    Hypotension    Generalized weakness     REFERRING DIAG: Other symptoms and signs involving the musculoskeletal system  THERAPY DIAG:  Muscle weakness (generalized)  Unsteadiness on feet  Rationale for Evaluation and Treatment Rehabilitation  PERTINENT HISTORY: Pt is a 76 y/o male, returning to PT following admission to ED for evaluation of weakness s/p fall where pt was found to be COVID positive. Pt was in the hospital from 02/17/2021-02/23/2021. Pt was then d/c to home where he received [place] PT. Pt reports increased LBP since d/c from hospital and decreased  balance since last in outpatient PT. While pt using QC currently, he reports he has been using a RW at home or grabbing onto the wall to steady himself while walking. Pt reports he has been walking home distances. PMH significant for TIA, a-fib, pulmonary embolism, TIA, gout, rhabdomyolysis.   PRECAUTIONS: fall risk  SUBJECTIVE: Knee is feeling better since doing [employer]. Pt reports had some loose stools after a meal the other day and felt fatigued. Feeling better currently.   PAIN:  Are you having pain? No   TODAY'S TREATMENT:  10/03/2021 - Gait belt donned and CGA provided throughout unless otherwise noted  [employer]- Treadmill ambulation for LE mm and cardioresp endurance - x 6 min, 1.6-2.4 mph at incline 2%, use of BUE support. Fatiguing. Pt requires rest break following intervention.   NMR- Basketball on airex pad in the following LE positions: WBOS, semi-tandem each LE - 1 round of each position. Intermittent UE support throughout, CGA-min a to help pt maintain stability.  [employer]- Floor to stand transfer training on red mat. Several minutes instructing pt from quadruped to half-kneeling to stand with BUE support on support surface to assist with stand. Practice in part and whole. Close CGA-min a throughout. Recovery intervals taken between reps as intervention is fatiguing for pt.  Pt reports no increase in knee pain at end of session.   PATIENT EDUCATION:  Education details: exercise technique, body mechanics, floor transfer technique Person educated: Patient Education method: Explanation, Demonstration, and Verbal cues Education comprehension: verbalized understanding, returned demonstration, verbal cues required, and needs further education   HOME EXERCISE PROGRAM: 10/03/2021: No updates, pt to continue HEP as previously given  09/12/21: [employer] HEP - Seated Row 2 sets of 15 reps at a weight that feels like a moderate challenge 2. Seated hamstring curls 2 sets  of 15 reps at a weight that feels like a moderate challenge 3. Seated leg press 2 sets of 15 reps at a weight that feels like a moderate challenge 4. Seated leg press heel raises 2 sets of 15 reps at a weight that feels like a moderate challenge Pictures of interventions also provided to pt in handout.  1/9: Access Code: [email or web address]; 4/27: Access Code: [email or web address]   PT Short Term Goals       PT SHORT TERM GOAL #1   Title Patient will be independent in home exercise program to improve balance, and strength/mobility for better functional independence with ADLs and decreased fall risk.    Baseline 03/14/21: to be initiated within next 1-2 sessions 2/6: HEP compliance    Time 6    Period Weeks    Status Achieved    Target Date 06/06/21      PT SHORT TERM GOAL #2   Title Pt will improve gross BLE strength to 5/5 in order to increase ease and safety with mobility and ADLs.    Baseline 03/14/21: strength 4+/5 B, greatest deficit in B ankle DFs 4-/5 B 2/6: grossly 4+/5 with df 4/5 3/13: see note; 5/22: grossly 4+/5 bilat, weakness still most prominent in PF, 6/26: grossly 5/5, weakness prominent in DF (4-/5)   Time 6    Period Weeks    Status Partially Met    Target Date 09/09/21              PT Long Term Goals       PT LONG TERM GOAL #1   Title Patient will increase FOTO score to greater than 59 to demonstrate statistically significant improvement in mobility and quality of life.    Baseline 03/14/21: 58 2/6: 57% 3/13: 57.4%; 3/30: 62    Time 12    Period Weeks    Status Achieved    Target Date 06/06/21      PT LONG TERM GOAL #2   Title Patient (> 29 years old) will complete five times sit to stand test in < 10 seconds indicating increased LE strength and decreased fall risk    Baseline 03/14/21: 12 seconds 2/6: 12 seconds 3/13: 13.8 seconds without hands; 3/30: 13 sec; 4/1; 5/22: 12 sec hands free, 6/26: 12 sec hands free   Time 12    Period Weeks    Status Partially Met    Target Date  11/25/2021      PT LONG TERM GOAL #3   Title Pt will improve DGI by at least 3 points in order to demonstrate clinically significant improvement in balance and decreased risk for falls.    Baseline 03/14/21: 15/24 2/6: 17/24 3/13: 19/24; 3/30: 21/24    Time 12    Period Weeks    Status Achieved    Target Date 06/06/21      PT LONG TERM GOAL #4   Title Pt will increase 6MWT by at least 70m (180ft) in order  to demonstrate clinically significant improvement in cardiopulmonary endurance and community ambulation    Baseline 03/14/21: 795 ft with QC 2/6: 865 ft without AD 3/13: 770 ft with walking stick with knee pain; 3/30: 1056 ft    Time 12    Period Weeks    Status Achieved    Target Date 08/29/21      PT LONG TERM GOAL #5   Title The patient will be able to maintain >5 seconds of SLB on BLEs in order to decrease risk of falls when navigating obstacles, curbs and steps.    Baseline 03/14/21: RLE 4 seconds, LLE 2 seconds 2/6: L 3 seconds R 4 seconds 3/13: L 4 seconds with pain R 15 seconds; 3/30: L 4 sec R 8 sec; 5/22: 2 sec RLE, 3 sec LLE; 6/26: LLE 11 sec, RLE 7 sec   Time 12    Period Weeks    Status MET   Target Date 08/29/21      PT LONG TERM GOAL #6   Title Patient will increase 10 meter walk test to at least 1.0 m/s with LRD as to improve gait speed for better community ambulation and to reduce fall risk.    Baseline 03/14/21: 0.67 m/s 2/6: 1.3 m/s    Time 12    Period Weeks    Status Achieved    Target Date 06/06/21     PT LONG TERM GOAL #7  Title Pt will improve bilateral DF strength to 5/5 in order to increase ease and safety with mobility and ADLs.  Baseline 6/26: 4-/5  Time 12   Period Weeks   Status New  Target Date 11/25/21                 Plan     Clinical Impression Statement Large part of session dedicated to floor transfers. This activity is fatiguing for pt. PT instructed pt through part and whole practice. Pt did improve technique with reps, but relies  heavily on BUE on support surface with difficulty transitioning from quadruped to half-kneeling. Will continue to focus on this intervention in future sessions. The pt continues to benefit from further skilled PT to improve balance and mobility to decrease fall risk and improve QoL.    Personal Factors and Comorbidities Age;Past/Current Experience;Comorbidity 3+    Comorbidities A-fib, lumbar disc disease, prostate CA, TIA, gout, hx of pulmonary embolism    Examination-Activity Limitations Carry;Lift;Stand;Locomotion Level;Bend;Dressing;Reach Overhead;Squat;Transfers;Caring for Others;Stairs    Examination-Participation Restrictions Community Activity;Yard Work;Cleaning;Laundry;Shop;Church    Stability/Clinical Decision Making Evolving/Moderate complexity    Rehab Potential Good    PT Frequency 2x / week    PT Duration 12 weeks    PT Treatment/Interventions ADLs/Self Care Home Management;Biofeedback;Aquatic Therapy;Canalith Repostioning;Cryotherapy;Electrical Stimulation;Iontophoresis 27m/ml Dexamethasone;Moist Heat;Traction;Ultrasound;Gait training;Stair training;DME Instruction;Functional mobility training;Therapeutic activities;Therapeutic exercise;Balance training;Neuromuscular re-education;Patient/family education;Manual techniques;Passive range of motion;Dry needling;Energy conservation;Vestibular;Joint Manipulations;Orthotic Fit/Training;Splinting;Taping;Visual/perceptual remediation/compensation    PT Next Visit Plan Dynamic & static balance, transfers to/from floor, continue POC        Consulted and Agree with Plan of Care Patient              HZollie Pee PT 10/04/2021, 8:19 AM

## 2021-10-07 ENCOUNTER — Ambulatory Visit: Payer: Medicare Other

## 2021-10-10 ENCOUNTER — Ambulatory Visit: Payer: Medicare Other | Attending: Internal Medicine

## 2021-10-10 DIAGNOSIS — R262 Difficulty in walking, not elsewhere classified: Secondary | ICD-10-CM

## 2021-10-10 DIAGNOSIS — M6281 Muscle weakness (generalized): Secondary | ICD-10-CM

## 2021-10-10 DIAGNOSIS — R2681 Unsteadiness on feet: Secondary | ICD-10-CM | POA: Diagnosis present

## 2021-10-10 NOTE — Therapy (Signed)
OUTPATIENT PHYSICAL THERAPY TREATMENT NOTE    Patient Name: Nicholas [name] MRN: [id number] DOB:02/08/1946, 76 y.o., male Today's Date: 10/10/2021  PCP: Miller, Mark F, MD REFERRING PROVIDER: Miller, Mark F, MD   PT End of Session - 10/10/21 1455     Visit Number 56    Number of Visits 73    Date for PT Re-Evaluation 11/25/21    PT Start Time 1103    PT Stop Time 1144    PT Time Calculation (min) 41 min    Equipment Utilized During Treatment Gait belt    Activity Tolerance Patient tolerated treatment well;No increased pain    Behavior During Therapy WFL for tasks assessed/performed                    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   Dysrhythmia    Pulmonary embolism (HCC)    Skin cancer    TIA (transient ischemic attack)    Past Surgical History:  Procedure Laterality Date   APPENDECTOMY     COLONOSCOPY WITH PROPOFOL N/A 11/23/2019   Procedure: COLONOSCOPY WITH PROPOFOL;  Surgeon: Toledo, Teodoro K, MD;  Location: [facility] ENDOSCOPY;  Service: Gastroenterology;  Laterality: N/A;   cyber knife surgery     PROSTATE SURGERY     Patient Active Problem List   Diagnosis Date Noted   COVID-19 virus infection 02/17/2021   A-fib (HCC)    TIA (transient ischemic attack)    Lactic acidosis    Hyponatremia    Hypotension    Generalized weakness     REFERRING DIAG: Other symptoms and signs involving the musculoskeletal system  THERAPY DIAG:  Muscle weakness (generalized)  Unsteadiness on feet  Difficulty in walking, not elsewhere classified  Rationale for Evaluation and Treatment Rehabilitation  PERTINENT HISTORY: Pt is a 76 y/o male, returning to PT following admission to ED for evaluation of weakness s/p fall where pt was found to be COVID positive. Pt was in the hospital from 02/17/2021-02/23/2021. Pt was then d/c to home where he received [facility] PT. Pt reports  increased LBP since d/c from hospital and decreased balance since last in outpatient PT. While pt using QC currently, he reports he has been using a RW at home or grabbing onto the wall to steady himself while walking. Pt reports he has been walking home distances. PMH significant for TIA, a-fib, pulmonary embolism, TIA, gout, rhabdomyolysis.   PRECAUTIONS: fall risk  SUBJECTIVE: Pt reports improvement in knee pain with exercise at the [facility]. Pt reports no pain currently, no stumbles/LOB. No other concerns currently.   PAIN:  Are you having pain? No   TODAY'S TREATMENT:  10/10/2021 - Gait belt donned and CGA provided throughout unless otherwise noted  TherEx-  STS without UE assist 11x. Relies on momentum of UE reach. Rates easy-medium  Heel raises 2x21 B. Fatiguing.   TherAct-  Pt performs several minutes of the following:   Floor to stand transfer training from red mat to standing with UE support. Continued practice and instruction in pt transitioning from quadruped to half-kneeling to stand with BUE support on support surface to assist with stand. Continued practice in part and whole for multiple reps. Close CGA-min a provided with intervention. Recovery intervals taken between reps. Pt with improved speed and technique on this date.  No knee pain with intervention  NMR-  In // bars   Heel raise isometric for balance on forefoot, performed B 2x30 sec  SLB 2x30-45 sec each LE  Tandem gait x multiple reps length of  // bars   EC NBOS on airex 2x30 sec  Comments for NMR: intermittent UE support throughout, no more than CGA     PATIENT EDUCATION:  Education details: exercise technique, body mechanics, continued instruction in floor transfer technique Person educated: Patient Education method: Explanation, Demonstration, and Verbal cues Education comprehension: verbalized understanding, returned demonstration, verbal cues required, and needs further education   HOME  EXERCISE PROGRAM: 10/10/2021: No updates, pt to continue HEP as previously given  09/12/21: [facility] HEP - Seated Row 2 sets of 15 reps at a weight that feels like a moderate challenge 2. Seated hamstring curls 2 sets of 15 reps at a weight that feels like a moderate challenge 3. Seated leg press 2 sets of 15 reps at a weight that feels like a moderate challenge 4. Seated leg press heel raises 2 sets of 15 reps at a weight that feels like a moderate challenge Pictures of interventions also provided to pt in handout.  1/9: Access Code: [email or web address]; 4/27: Access Code: [email or web address]   PT Short Term Goals       PT SHORT TERM GOAL #1   Title Patient will be independent in home exercise program to improve balance, and strength/mobility for better functional independence with ADLs and decreased fall risk.    Baseline 03/14/21: to be initiated within next 1-2 sessions 2/6: HEP compliance    Time 6    Period Weeks    Status Achieved    Target Date 06/06/21      PT SHORT TERM GOAL #2   Title Pt will improve gross BLE strength to 5/5 in order to increase ease and safety with mobility and ADLs.    Baseline 03/14/21: strength 4+/5 B, greatest deficit in B ankle DFs 4-/5 B 2/6: grossly 4+/5 with df 4/5 3/13: see note; 5/22: grossly 4+/5 bilat, weakness still most prominent in PF, 6/26: grossly 5/5, weakness prominent in DF (4-/5)   Time 6    Period Weeks    Status Partially Met    Target Date 09/09/21              PT Long Term Goals       PT LONG TERM GOAL #1   Title Patient will increase FOTO score to greater than 59 to demonstrate statistically significant improvement in mobility and quality of life.    Baseline 03/14/21: 58 2/6: 57% 3/13: 57.4%; 3/30: 62    Time 12    Period Weeks    Status Achieved    Target Date 06/06/21      PT LONG TERM GOAL #2   Title Patient (> 60 years old) will complete five times sit to stand test in < 10 seconds indicating increased LE strength and decreased fall risk     Baseline 03/14/21: 12 seconds 2/6: 12 seconds 3/13: 13.8 seconds without hands; 3/30: 13 sec; 4/1; 5/22: 12 sec hands free, 6/26: 12 sec hands free   Time 12    Period Weeks    Status Partially Met    Target Date 11/25/2021      PT LONG TERM GOAL #3   Title Pt will improve DGI by at least 3 points in order to demonstrate clinically significant improvement in balance and decreased risk for falls.    Baseline 03/14/21: 15/24 2/6: 17/24 3/13: 19/24; 3/30: 21/24      Time 12    Period Weeks    Status Achieved    Target Date 06/06/21      PT LONG TERM GOAL #4   Title Pt will increase 6MWT by at least 9m(1644f in order to demonstrate clinically significant improvement in cardiopulmonary endurance and community ambulation    Baseline 03/14/21: 795 ft with QC 2/6: 865 ft without AD 3/13: 770 ft with walking stick with knee pain; 3/30: 1056 ft    Time 12    Period Weeks    Status Achieved    Target Date 08/29/21      PT LONG TERM GOAL #5   Title The patient will be able to maintain >5 seconds of SLB on BLEs in order to decrease risk of falls when navigating obstacles, curbs and steps.    Baseline 03/14/21: RLE 4 seconds, LLE 2 seconds 2/6: L 3 seconds R 4 seconds 3/13: L 4 seconds with pain R 15 seconds; 3/30: L 4 sec R 8 sec; 5/22: 2 sec RLE, 3 sec LLE; 6/26: LLE 11 sec, RLE 7 sec   Time 12    Period Weeks    Status MET   Target Date 08/29/21      PT LONG TERM GOAL #6   Title Patient will increase 10 meter walk test to at least 1.0 m/s with LRD as to improve gait speed for better community ambulation and to reduce fall risk.    Baseline 03/14/21: 0.67 m/s 2/6: 1.3 m/s    Time 12    Period Weeks    Status Achieved    Target Date 06/06/21     PT LONG TERM GOAL #7  Title Pt will improve bilateral DF strength to 5/5 in order to increase ease and safety with mobility and ADLs.  Baseline 6/26: 4-/5  Time 12   Period Weeks   Status New  Target Date 11/25/21                 Plan      Clinical Impression Statement Continued focus on floor to stand transfer training. While intervention is still fatiguing for pt, he exhibits improved technique and speed on this date. The pt continues to benefit from further skilled PT to improve balance and mobility to decrease fall risk and improve QoL.    Personal Factors and Comorbidities Age;Past/Current Experience;Comorbidity 3+    Comorbidities A-fib, lumbar disc disease, prostate CA, TIA, gout, hx of pulmonary embolism    Examination-Activity Limitations Carry;Lift;Stand;Locomotion Level;Bend;Dressing;Reach Overhead;Squat;Transfers;Caring for Others;Stairs    Examination-Participation Restrictions Community Activity;Yard Work;Cleaning;Laundry;Shop;Church    Stability/Clinical Decision Making Evolving/Moderate complexity    Rehab Potential Good    PT Frequency 2x / week    PT Duration 12 weeks    PT Treatment/Interventions ADLs/Self Care Home Management;Biofeedback;Aquatic Therapy;Canalith Repostioning;Cryotherapy;Electrical Stimulation;Iontophoresis 64m70ml Dexamethasone;Moist Heat;Traction;Ultrasound;Gait training;Stair training;DME Instruction;Functional mobility training;Therapeutic activities;Therapeutic exercise;Balance training;Neuromuscular re-education;Patient/family education;Manual techniques;Passive range of motion;Dry needling;Energy conservation;Vestibular;Joint Manipulations;Orthotic Fit/Training;Splinting;Taping;Visual/perceptual remediation/compensation    PT Next Visit Plan Dynamic & static balance, transfers to/from floor, continue POC        Consulted and Agree with Plan of Care Patient              HalZollie PeeT 10/10/2021, 2:57 PM

## 2021-10-14 ENCOUNTER — Ambulatory Visit: Payer: Medicare Other

## 2021-10-17 ENCOUNTER — Ambulatory Visit: Payer: Medicare Other

## 2021-10-17 DIAGNOSIS — M6281 Muscle weakness (generalized): Secondary | ICD-10-CM

## 2021-10-17 DIAGNOSIS — R262 Difficulty in walking, not elsewhere classified: Secondary | ICD-10-CM

## 2021-10-17 DIAGNOSIS — R2681 Unsteadiness on feet: Secondary | ICD-10-CM

## 2021-10-17 NOTE — Therapy (Signed)
OUTPATIENT PHYSICAL THERAPY TREATMENT NOTE    Patient Name: Nicholas [name] MRN: [id number] DOB:[birth year], 76 y.o., male Today's Date: 10/17/2021  PCP: Rusty Aus, MD REFERRING PROVIDER: Rusty Aus, MD   PT End of Session - 10/17/21 1057     Visit Number 73    Number of Visits 73    Date for PT Re-Evaluation 11/25/21    PT Start Time 1102    PT Stop Time 1145    PT Time Calculation (min) 43 min    Equipment Utilized During Treatment Gait belt    Activity Tolerance Patient tolerated treatment well;No increased pain    Behavior During Therapy [facility] for tasks assessed/performed                    Past Medical History:  Diagnosis Date   A-fib ([place])    Cancer ([place]) 10/25/2019   Prostate   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   Dysrhythmia    Pulmonary embolism (HCC)    Skin cancer    TIA (transient ischemic attack)    Past Surgical History:  Procedure Laterality Date   APPENDECTOMY     COLONOSCOPY WITH PROPOFOL N/A 11/23/2019   Procedure: COLONOSCOPY WITH PROPOFOL;  Surgeon: Toledo, Benay Pike, MD;  Location: [facility] ENDOSCOPY;  Service: Gastroenterology;  Laterality: N/A;   cyber knife surgery     PROSTATE SURGERY     Patient Active Problem List   Diagnosis Date Noted   COVID-19 virus infection 02/17/2021   A-fib ([place])    TIA (transient ischemic attack)    Lactic acidosis    Hyponatremia    Hypotension    Generalized weakness     REFERRING DIAG: Other symptoms and signs involving the musculoskeletal system  THERAPY DIAG:  Unsteadiness on feet  Muscle weakness (generalized)  Difficulty in walking, not elsewhere classified  Rationale for Evaluation and Treatment Rehabilitation  PERTINENT HISTORY: Pt is a 76 y/o male, returning to PT following admission to ED for evaluation of weakness s/p fall where pt was found to be COVID positive. Pt was in the hospital from 02/17/2021-02/23/2021. Pt was then d/c to home where he received [facility] PT. Pt reports  increased LBP since d/c from hospital and decreased balance since last in outpatient PT. While pt using QC currently, he reports he has been using a RW at home or grabbing onto the wall to steady himself while walking. Pt reports he has been walking home distances. PMH significant for TIA, a-fib, pulmonary embolism, TIA, gout, rhabdomyolysis.   PRECAUTIONS: fall risk  SUBJECTIVE: Pt with no current concerns. He has continued to workout at the [facility] with good results. Feels it is improving his knee pain. He reports no stumbles/falls.    PAIN:  Are you having pain? No   TODAY'S TREATMENT:  10/17/2021 - Gait belt donned and CGA provided throughout unless otherwise noted  TherEx-  Treadmill ambulation for LE mm and cardioresp endurance. 1.6 mph gradual increase to 2.7 mph at 1% elevation in 30-40 sec intervals. Completes total of 7 min 30 sec   STS without UE assist 11x.   TherAct-  Pt performs several minutes of the following:   Floor to stand transfer training from red mat to standing with UE support. Continued practice and instruction in pt transitioning from quadruped to half-kneeling to stand with BUE support on support surface to assist with stand. Progressed to pt starting seated on mat away from mat table. He was then instructed to transition to  quadruped to crawl to table, then utilize UE support on table to transition to half-kneeling and then standing. Continued practice in part and whole for multiple reps. Close CGA-min a provided with intervention. Recovery intervals taken between reps.   No knee pain with intervention  NMR- In // bars Airex beam tandem walk x multiple reps SLB 2 x30 sec each LE Comments: intermittent UE support throughout     PATIENT EDUCATION:  Education details: exercise technique, body mechanics, continued instruction in floor transfer technique Person educated: Patient Education method: Explanation, Demonstration, and Verbal cues Education  comprehension: verbalized understanding, returned demonstration, verbal cues required, and needs further education   HOME EXERCISE PROGRAM: 10/17/2021: No updates, pt to continue HEP as previously given  09/12/21: [facility] HEP - Seated Row 2 sets of 15 reps at a weight that feels like a moderate challenge 2. Seated hamstring curls 2 sets of 15 reps at a weight that feels like a moderate challenge 3. Seated leg press 2 sets of 15 reps at a weight that feels like a moderate challenge 4. Seated leg press heel raises 2 sets of 15 reps at a weight that feels like a moderate challenge Pictures of interventions also provided to pt in handout.  1/9: Access Code: [email or web address]; 4/27: Access Code: [email or web address]   PT Short Term Goals       PT SHORT TERM GOAL #1   Title Patient will be independent in home exercise program to improve balance, and strength/mobility for better functional independence with ADLs and decreased fall risk.    Baseline 03/14/21: to be initiated within next 1-2 sessions 2/6: HEP compliance    Time 6    Period Weeks    Status Achieved    Target Date 06/06/21      PT SHORT TERM GOAL #2   Title Pt will improve gross BLE strength to 5/5 in order to increase ease and safety with mobility and ADLs.    Baseline 03/14/21: strength 4+/5 B, greatest deficit in B ankle DFs 4-/5 B 2/6: grossly 4+/5 with df 4/5 3/13: see note; 5/22: grossly 4+/5 bilat, weakness still most prominent in PF, 6/26: grossly 5/5, weakness prominent in DF (4-/5)   Time 6    Period Weeks    Status Partially Met    Target Date 09/09/21              PT Long Term Goals       PT LONG TERM GOAL #1   Title Patient will increase FOTO score to greater than 59 to demonstrate statistically significant improvement in mobility and quality of life.    Baseline 03/14/21: 58 2/6: 57% 3/13: 57.4%; 3/30: 62    Time 12    Period Weeks    Status Achieved    Target Date 06/06/21      PT LONG TERM GOAL #2   Title Patient (> 84  years old) will complete five times sit to stand test in < 10 seconds indicating increased LE strength and decreased fall risk    Baseline 03/14/21: 12 seconds 2/6: 12 seconds 3/13: 13.8 seconds without hands; 3/30: 13 sec; 4/1; 5/22: 12 sec hands free, 6/26: 12 sec hands free   Time 12    Period Weeks    Status Partially Met    Target Date 11/25/2021      PT LONG TERM GOAL #3   Title Pt will improve DGI by at least 3 points in order to demonstrate clinically significant improvement in balance  and decreased risk for falls.    Baseline 03/14/21: 15/24 2/6: 17/24 3/13: 19/24; 3/30: 21/24    Time 12    Period Weeks    Status Achieved    Target Date 06/06/21      PT LONG TERM GOAL #4   Title Pt will increase 6MWT by at least 90m(1667f in order to demonstrate clinically significant improvement in cardiopulmonary endurance and community ambulation    Baseline 03/14/21: 795 ft with QC 2/6: 865 ft without AD 3/13: 770 ft with walking stick with knee pain; 3/30: 1056 ft    Time 12    Period Weeks    Status Achieved    Target Date 08/29/21      PT LONG TERM GOAL #5   Title The patient will be able to maintain >5 seconds of SLB on BLEs in order to decrease risk of falls when navigating obstacles, curbs and steps.    Baseline 03/14/21: RLE 4 seconds, LLE 2 seconds 2/6: L 3 seconds R 4 seconds 3/13: L 4 seconds with pain R 15 seconds; 3/30: L 4 sec R 8 sec; 5/22: 2 sec RLE, 3 sec LLE; 6/26: LLE 11 sec, RLE 7 sec   Time 12    Period Weeks    Status MET   Target Date 08/29/21      PT LONG TERM GOAL #6   Title Patient will increase 10 meter walk test to at least 1.0 m/s with LRD as to improve gait speed for better community ambulation and to reduce fall risk.    Baseline 03/14/21: 0.67 m/s 2/6: 1.3 m/s    Time 12    Period Weeks    Status Achieved    Target Date 06/06/21     PT LONG TERM GOAL #7  Title Pt will improve bilateral DF strength to 5/5 in order to increase ease and safety with mobility and  ADLs.  Baseline 6/26: 4-/5  Time 12   Period Weeks   Status New  Target Date 11/25/21                 Plan     Clinical Impression Statement Pt able to advance to more challenging variation of floor to stand transfer. He continues to rate this as difficult, but exhibits overall improvement in technique. The pt continues to benefit from further skilled PT to improve balance and mobility to decrease fall risk and improve QoL.    Personal Factors and Comorbidities Age;Past/Current Experience;Comorbidity 3+    Comorbidities A-fib, lumbar disc disease, prostate CA, TIA, gout, hx of pulmonary embolism    Examination-Activity Limitations Carry;Lift;Stand;Locomotion Level;Bend;Dressing;Reach Overhead;Squat;Transfers;Caring for Others;Stairs    Examination-Participation Restrictions Community Activity;Yard Work;Cleaning;Laundry;Shop;Church    Stability/Clinical Decision Making Evolving/Moderate complexity    Rehab Potential Good    PT Frequency 2x / week    PT Duration 12 weeks    PT Treatment/Interventions ADLs/Self Care Home Management;Biofeedback;Aquatic Therapy;Canalith Repostioning;Cryotherapy;Electrical Stimulation;Iontophoresis 5m63ml Dexamethasone;Moist Heat;Traction;Ultrasound;Gait training;Stair training;DME Instruction;Functional mobility training;Therapeutic activities;Therapeutic exercise;Balance training;Neuromuscular re-education;Patient/family education;Manual techniques;Passive range of motion;Dry needling;Energy conservation;Vestibular;Joint Manipulations;Orthotic Fit/Training;Splinting;Taping;Visual/perceptual remediation/compensation    PT Next Visit Plan Dynamic & static balance, transfers to/from floor, continue POC        Consulted and Agree with Plan of Care Patient              HalZollie PeeT 10/17/2021, 5:11 PM

## 2021-10-21 ENCOUNTER — Ambulatory Visit: Payer: Medicare Other

## 2021-10-24 ENCOUNTER — Ambulatory Visit: Payer: Medicare Other

## 2021-10-24 DIAGNOSIS — M6281 Muscle weakness (generalized): Secondary | ICD-10-CM | POA: Diagnosis not present

## 2021-10-24 DIAGNOSIS — R2681 Unsteadiness on feet: Secondary | ICD-10-CM

## 2021-10-24 NOTE — Therapy (Signed)
OUTPATIENT PHYSICAL THERAPY TREATMENT NOTE    Patient Name: Nicholas [name] MRN: [id number] DOB:[birth year], 76 y.o., male Today's Date: 10/24/2021  PCP: Rusty Aus, MD REFERRING PROVIDER: Rusty Aus, MD   PT End of Session - 10/24/21 1054     Visit Number 17    Number of Visits 73    Date for PT Re-Evaluation 11/25/21    PT Start Time 1103    PT Stop Time 1145    PT Time Calculation (min) 42 min    Equipment Utilized During Treatment Gait belt    Activity Tolerance Patient tolerated treatment well;No increased pain    Behavior During Therapy [facility] for tasks assessed/performed                     Past Medical History:  Diagnosis Date   A-fib ([place])    Cancer ([place]) 10/25/2019   Prostate   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   Dysrhythmia    Pulmonary embolism (H[facility])    Skin cancer    TIA (transient ischemic attack)    Past Surgical History:  Procedure Laterality Date   APPENDECTOMY     COLONOSCOPY WITH PROPOFOL N/A 11/23/2019   Procedure: COLONOSCOPY WITH PROPOFOL;  Surgeon: Toledo, Benay Pike, MD;  Location: [facility] ENDOSCOPY;  Service: Gastroenterology;  Laterality: N/A;   cyber knife surgery     PROSTATE SURGERY     Patient Active Problem List   Diagnosis Date Noted   COVID-19 virus infection 02/17/2021   A-fib ([place])    TIA (transient ischemic attack)    Lactic acidosis    Hyponatremia    Hypotension    Generalized weakness     REFERRING DIAG: Other symptoms and signs involving the musculoskeletal system  THERAPY DIAG:  Unsteadiness on feet  Muscle weakness (generalized)  Rationale for Evaluation and Treatment Rehabilitation  PERTINENT HISTORY: Pt is a 76 y/o male, returning to PT following admission to ED for evaluation of weakness s/p fall where pt was found to be COVID positive. Pt was in the hospital from 02/17/2021-02/23/2021. Pt was then d/c to home where he received [facility]. Pt reports increased LBP since d/c from hospital and  decreased balance since last in outpatient PT. While pt using QC currently, he reports he has been using a RW at home or grabbing onto the wall to steady himself while walking. Pt reports he has been walking home distances. PMH significant for TIA, a-fib, pulmonary embolism, TIA, gout, rhabdomyolysis.   PRECAUTIONS: fall risk  SUBJECTIVE: Pt reports no pain today. He has not had any recent falls, but continues to stumble and feel balance has not really improved. He does report working out in [place] and that this has continued to help his LE strength and pain.   PAIN:  Are you having pain? No   TODAY'S TREATMENT:  10/24/2021 - Gait belt donned and CGA provided throughout unless otherwise noted   TherAct-  Pt performs several minutes of the following:   Floor to stand transfer training from red mat to standing with UE support on mat table. Continued practice in transitioning from seated on floor>quadruped> half-kneeling to stand with BUE support to assist to stand.Continued practice in part and whole for multiple reps. Close CGA-min a provided throughout. Fewer recovery intervals required on this date. Pt states, "it's a little easier" but still challenging.  No knee pain with intervention   NMR- Near support surface: Red mat, WBOS and semi-tandem each LE basketball shots for  dynamic balance. Requires up to min assist and intermittent UE support.  Standing on half-foam: EC 60 sec. Intermittent UE support EO WBOS vertical and horiz head turns 2x10 of each. Intermittent UE support  PATIENT EDUCATION:  Education details: exercise technique, body mechanics, continued instruction in floor transfer technique Person educated: Patient Education method: Explanation, Demonstration, and Verbal cues Education comprehension: verbalized understanding, returned demonstration, verbal cues required, and needs further education   HOME EXERCISE PROGRAM: 10/24/2021: No updates, pt to continue HEP  as previously given  09/12/21: [employer] HEP - Seated Row 2 sets of 15 reps at a weight that feels like a moderate challenge 2. Seated hamstring curls 2 sets of 15 reps at a weight that feels like a moderate challenge 3. Seated leg press 2 sets of 15 reps at a weight that feels like a moderate challenge 4. Seated leg press heel raises 2 sets of 15 reps at a weight that feels like a moderate challenge Pictures of interventions also provided to pt in handout.  1/9: Access Code: [email or web address]; 4/27: Access Code: [email or web address]   PT Short Term Goals       PT SHORT TERM GOAL #1   Title Patient will be independent in home exercise program to improve balance, and strength/mobility for better functional independence with ADLs and decreased fall risk.    Baseline 03/14/21: to be initiated within next 1-2 sessions 2/6: HEP compliance    Time 6    Period Weeks    Status Achieved    Target Date 06/06/21      PT SHORT TERM GOAL #2   Title Pt will improve gross BLE strength to 5/5 in order to increase ease and safety with mobility and ADLs.    Baseline 03/14/21: strength 4+/5 B, greatest deficit in B ankle DFs 4-/5 B 2/6: grossly 4+/5 with df 4/5 3/13: see note; 5/22: grossly 4+/5 bilat, weakness still most prominent in PF, 6/26: grossly 5/5, weakness prominent in DF (4-/5)   Time 6    Period Weeks    Status Partially Met    Target Date 09/09/21              PT Long Term Goals       PT LONG TERM GOAL #1   Title Patient will increase FOTO score to greater than 59 to demonstrate statistically significant improvement in mobility and quality of life.    Baseline 03/14/21: 58 2/6: 57% 3/13: 57.4%; 3/30: 62    Time 12    Period Weeks    Status Achieved    Target Date 06/06/21      PT LONG TERM GOAL #2   Title Patient (> 82 years old) will complete five times sit to stand test in < 10 seconds indicating increased LE strength and decreased fall risk    Baseline 03/14/21: 12 seconds 2/6: 12 seconds 3/13: 13.8  seconds without hands; 3/30: 13 sec; 4/1; 5/22: 12 sec hands free, 6/26: 12 sec hands free   Time 12    Period Weeks    Status Partially Met    Target Date 11/25/2021      PT LONG TERM GOAL #3   Title Pt will improve DGI by at least 3 points in order to demonstrate clinically significant improvement in balance and decreased risk for falls.    Baseline 03/14/21: 15/24 2/6: 17/24 3/13: 19/24; 3/30: 21/24    Time 12    Period Weeks    Status Achieved    Target Date  06/06/21      PT LONG TERM GOAL #4   Title Pt will increase 6MWT by at least 80m(1635f in order to demonstrate clinically significant improvement in cardiopulmonary endurance and community ambulation    Baseline 03/14/21: 795 ft with QC 2/6: 865 ft without AD 3/13: 770 ft with walking stick with knee pain; 3/30: 1056 ft    Time 12    Period Weeks    Status Achieved    Target Date 08/29/21      PT LONG TERM GOAL #5   Title The patient will be able to maintain >5 seconds of SLB on BLEs in order to decrease risk of falls when navigating obstacles, curbs and steps.    Baseline 03/14/21: RLE 4 seconds, LLE 2 seconds 2/6: L 3 seconds R 4 seconds 3/13: L 4 seconds with pain R 15 seconds; 3/30: L 4 sec R 8 sec; 5/22: 2 sec RLE, 3 sec LLE; 6/26: LLE 11 sec, RLE 7 sec   Time 12    Period Weeks    Status MET   Target Date 08/29/21      PT LONG TERM GOAL #6   Title Patient will increase 10 meter walk test to at least 1.0 m/s with LRD as to improve gait speed for better community ambulation and to reduce fall risk.    Baseline 03/14/21: 0.67 m/s 2/6: 1.3 m/s    Time 12    Period Weeks    Status Achieved    Target Date 06/06/21     PT LONG TERM GOAL #7  Title Pt will improve bilateral DF strength to 5/5 in order to increase ease and safety with mobility and ADLs.  Baseline 6/26: 4-/5  Time 12   Period Weeks   Status New  Target Date 11/25/21                 Plan     Clinical Impression Statement Pt reports today that floor  transfers feel somewhat easier although still challenging. Pt did require fewer rest breaks with this intervention. Remainder of session focused on compliant surface training. Pt required intermittent UE support throughout, exhibited ankle righting, but delay in reactive postural control responses. The pt continues to benefit from further skilled PT to improve balance and mobility to decrease fall risk and improve QoL.    Personal Factors and Comorbidities Age;Past/Current Experience;Comorbidity 3+    Comorbidities A-fib, lumbar disc disease, prostate CA, TIA, gout, hx of pulmonary embolism    Examination-Activity Limitations Carry;Lift;Stand;Locomotion Level;Bend;Dressing;Reach Overhead;Squat;Transfers;Caring for Others;Stairs    Examination-Participation Restrictions Community Activity;Yard Work;Cleaning;Laundry;Shop;Church    Stability/Clinical Decision Making Evolving/Moderate complexity    Rehab Potential Good    PT Frequency 2x / week    PT Duration 12 weeks    PT Treatment/Interventions ADLs/Self Care Home Management;Biofeedback;Aquatic Therapy;Canalith Repostioning;Cryotherapy;Electrical Stimulation;Iontophoresis 15m99ml Dexamethasone;Moist Heat;Traction;Ultrasound;Gait training;Stair training;DME Instruction;Functional mobility training;Therapeutic activities;Therapeutic exercise;Balance training;Neuromuscular re-education;Patient/family education;Manual techniques;Passive range of motion;Dry needling;Energy conservation;Vestibular;Joint Manipulations;Orthotic Fit/Training;Splinting;Taping;Visual/perceptual remediation/compensation    PT Next Visit Plan Dynamic & static balance, transfers to/from floor, continue POC        Consulted and Agree with Plan of Care Patient              HalZollie PeeT 10/24/2021, 2:07 PM

## 2021-10-28 ENCOUNTER — Ambulatory Visit: Payer: Medicare Other

## 2021-10-31 ENCOUNTER — Ambulatory Visit: Payer: Medicare Other

## 2021-10-31 DIAGNOSIS — M6281 Muscle weakness (generalized): Secondary | ICD-10-CM | POA: Diagnosis not present

## 2021-10-31 DIAGNOSIS — R262 Difficulty in walking, not elsewhere classified: Secondary | ICD-10-CM

## 2021-10-31 DIAGNOSIS — R2681 Unsteadiness on feet: Secondary | ICD-10-CM

## 2021-10-31 NOTE — Therapy (Signed)
OUTPATIENT PHYSICAL THERAPY TREATMENT NOTE    Patient Name: Nicholas [name] MRN: [id number] DOB:1945-09-02, 76 y.o., male Today's Date: 10/31/2021  PCP: Rusty Aus, MD REFERRING PROVIDER: Rusty Aus, MD   PT End of Session - 10/31/21 1619     Visit Number 59    Number of Visits 73    Date for PT Re-Evaluation 11/25/21    PT Start Time 1104    PT Stop Time 1145    PT Time Calculation (min) 41 min    Equipment Utilized During Treatment Gait belt    Activity Tolerance Patient tolerated treatment well;No increased pain    Behavior During Therapy [facility] for tasks assessed/performed                      Past Medical History:  Diagnosis Date   A-fib ([place])    Cancer ([place]) 10/25/2019   Prostate   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   Dysrhythmia    Pulmonary embolism (HCC)    Skin cancer    TIA (transient ischemic attack)    Past Surgical History:  Procedure Laterality Date   APPENDECTOMY     COLONOSCOPY WITH PROPOFOL N/A 11/23/2019   Procedure: COLONOSCOPY WITH PROPOFOL;  Surgeon: Toledo, Benay Pike, MD;  Location: [facility] ENDOSCOPY;  Service: Gastroenterology;  Laterality: N/A;   cyber knife surgery     PROSTATE SURGERY     Patient Active Problem List   Diagnosis Date Noted   COVID-19 virus infection 02/17/2021   A-fib ([place])    TIA (transient ischemic attack)    Lactic acidosis    Hyponatremia    Hypotension    Generalized weakness     REFERRING DIAG: Other symptoms and signs involving the musculoskeletal system  THERAPY DIAG:  Unsteadiness on feet  Muscle weakness (generalized)  Difficulty in walking, not elsewhere classified  Rationale for Evaluation and Treatment Rehabilitation  PERTINENT HISTORY: Pt is a 76 y/o male, returning to PT following admission to ED for evaluation of weakness s/p fall where pt was found to be COVID positive. Pt was in the hospital from 02/17/2021-02/23/2021. Pt was then d/c to home where he received [facility] PT. Pt  reports increased LBP since d/c from hospital and decreased balance since last in outpatient PT. While pt using QC currently, he reports he has been using a RW at home or grabbing onto the wall to steady himself while walking. Pt reports he has been walking home distances. PMH significant for TIA, a-fib, pulmonary embolism, TIA, gout, rhabdomyolysis.   PRECAUTIONS: fall risk  SUBJECTIVE: Pt reports no pain today. He reports R ankle and lower leg swelling starting the other day after he hit LE on stationary bike, exhibits scabs just below R knee. He reports swelling has been improving.    PAIN:  Are you having pain? No   TODAY'S TREATMENT:  10/31/2021 - Gait belt donned and CGA provided throughout unless otherwise noted  TherEx:  Toe crunches RLE x 2 minutes  BLE ankle alphabet 1 round  STS 11x  NMR: Airex beam lateral and cross-over stepping x multiple reps of each with intermittent UE support. Lunges one foot on floor one foot on airex beam 2x30 sec of each. Intermittent UE support NBOS EC 2x30 sec. PT provides up to min assist to correct for LOB. SLB 3x30 sec each LE; intermittent UE support    TherAct-  Pt performs several minutes of the following:   Floor to stand transfer training from red  mat to standing with UE support on mat table. Continued practice in transitioning from seated on floor>quadruped> half-kneeling to stand with BUE support to assist to stand.Continued practice in part and whole for multiple reps. Close CGA-min a provided throughout. Intervention remains fatiguing, but pt reports it does feel easier than before.  No knee/LE pain with interventions   PATIENT EDUCATION:  Education details: exercise technique, body mechanics, continued instruction in floor transfer technique Person educated: Patient Education method: Explanation, Demonstration, and Verbal cues Education comprehension: verbalized understanding, returned demonstration, verbal cues required, and  needs further education   HOME EXERCISE PROGRAM: 10/24/2021: No updates, pt to continue HEP as previously given  09/12/21: [employer] HEP - Seated Row 2 sets of 15 reps at a weight that feels like a moderate challenge 2. Seated hamstring curls 2 sets of 15 reps at a weight that feels like a moderate challenge 3. Seated leg press 2 sets of 15 reps at a weight that feels like a moderate challenge 4. Seated leg press heel raises 2 sets of 15 reps at a weight that feels like a moderate challenge Pictures of interventions also provided to pt in handout.  1/9: Access Code: [email or web address]; 4/27: Access Code: [email or web address]   PT Short Term Goals       PT SHORT TERM GOAL #1   Title Patient will be independent in home exercise program to improve balance, and strength/mobility for better functional independence with ADLs and decreased fall risk.    Baseline 03/14/21: to be initiated within next 1-2 sessions 2/6: HEP compliance    Time 6    Period Weeks    Status Achieved    Target Date 06/06/21      PT SHORT TERM GOAL #2   Title Pt will improve gross BLE strength to 5/5 in order to increase ease and safety with mobility and ADLs.    Baseline 03/14/21: strength 4+/5 B, greatest deficit in B ankle DFs 4-/5 B 2/6: grossly 4+/5 with df 4/5 3/13: see note; 5/22: grossly 4+/5 bilat, weakness still most prominent in PF, 6/26: grossly 5/5, weakness prominent in DF (4-/5)   Time 6    Period Weeks    Status Partially Met    Target Date 09/09/21              PT Long Term Goals       PT LONG TERM GOAL #1   Title Patient will increase FOTO score to greater than 59 to demonstrate statistically significant improvement in mobility and quality of life.    Baseline 03/14/21: 58 2/6: 57% 3/13: 57.4%; 3/30: 62    Time 12    Period Weeks    Status Achieved    Target Date 06/06/21      PT LONG TERM GOAL #2   Title Patient (> 43 years old) will complete five times sit to stand test in < 10 seconds indicating increased LE  strength and decreased fall risk    Baseline 03/14/21: 12 seconds 2/6: 12 seconds 3/13: 13.8 seconds without hands; 3/30: 13 sec; 4/1; 5/22: 12 sec hands free, 6/26: 12 sec hands free   Time 12    Period Weeks    Status Partially Met    Target Date 11/25/2021      PT LONG TERM GOAL #3   Title Pt will improve DGI by at least 3 points in order to demonstrate clinically significant improvement in balance and decreased risk for falls.    Baseline 03/14/21: 15/24 2/6:  17/24 3/13: 19/24; 3/30: 21/24    Time 12    Period Weeks    Status Achieved    Target Date 06/06/21      PT LONG TERM GOAL #4   Title Pt will increase 6MWT by at least 1m(1637f in order to demonstrate clinically significant improvement in cardiopulmonary endurance and community ambulation    Baseline 03/14/21: 795 ft with QC 2/6: 865 ft without AD 3/13: 770 ft with walking stick with knee pain; 3/30: 1056 ft    Time 12    Period Weeks    Status Achieved    Target Date 08/29/21      PT LONG TERM GOAL #5   Title The patient will be able to maintain >5 seconds of SLB on BLEs in order to decrease risk of falls when navigating obstacles, curbs and steps.    Baseline 03/14/21: RLE 4 seconds, LLE 2 seconds 2/6: L 3 seconds R 4 seconds 3/13: L 4 seconds with pain R 15 seconds; 3/30: L 4 sec R 8 sec; 5/22: 2 sec RLE, 3 sec LLE; 6/26: LLE 11 sec, RLE 7 sec   Time 12    Period Weeks    Status MET   Target Date 08/29/21      PT LONG TERM GOAL #6   Title Patient will increase 10 meter walk test to at least 1.0 m/s with LRD as to improve gait speed for better community ambulation and to reduce fall risk.    Baseline 03/14/21: 0.67 m/s 2/6: 1.3 m/s    Time 12    Period Weeks    Status Achieved    Target Date 06/06/21     PT LONG TERM GOAL #7  Title Pt will improve bilateral DF strength to 5/5 in order to increase ease and safety with mobility and ADLs.  Baseline 6/26: 4-/5  Time 12   Period Weeks   Status New  Target Date 11/25/21                  Plan     Clinical Impression Statement Pt shows progress by reporting further improvement in ease with floor transfers, though this is still fatiguing. Large part of session focused on balance on both firm and compliant surface. Pt generally requires CGA and intermittent UE support, except for EC intervention in NBOS. Pt had to provide up to min assist to correct LOB. The pt continues to benefit from further skilled PT to improve balance and mobility to decrease fall risk and improve QoL.    Personal Factors and Comorbidities Age;Past/Current Experience;Comorbidity 3+    Comorbidities A-fib, lumbar disc disease, prostate CA, TIA, gout, hx of pulmonary embolism    Examination-Activity Limitations Carry;Lift;Stand;Locomotion Level;Bend;Dressing;Reach Overhead;Squat;Transfers;Caring for Others;Stairs    Examination-Participation Restrictions Community Activity;Yard Work;Cleaning;Laundry;Shop;Church    Stability/Clinical Decision Making Evolving/Moderate complexity    Rehab Potential Good    PT Frequency 2x / week    PT Duration 12 weeks    PT Treatment/Interventions ADLs/Self Care Home Management;Biofeedback;Aquatic Therapy;Canalith Repostioning;Cryotherapy;Electrical Stimulation;Iontophoresis 78m9ml Dexamethasone;Moist Heat;Traction;Ultrasound;Gait training;Stair training;DME Instruction;Functional mobility training;Therapeutic activities;Therapeutic exercise;Balance training;Neuromuscular re-education;Patient/family education;Manual techniques;Passive range of motion;Dry needling;Energy conservation;Vestibular;Joint Manipulations;Orthotic Fit/Training;Splinting;Taping;Visual/perceptual remediation/compensation    PT Next Visit Plan Dynamic & static balance, transfers to/from floor, continue POC        Consulted and Agree with Plan of Care Patient              HalZollie PeeT 10/31/2021, 4:27 PM

## 2021-11-04 ENCOUNTER — Ambulatory Visit: Payer: Medicare Other

## 2021-11-07 ENCOUNTER — Ambulatory Visit: Payer: Medicare Other

## 2021-11-07 DIAGNOSIS — R262 Difficulty in walking, not elsewhere classified: Secondary | ICD-10-CM

## 2021-11-07 DIAGNOSIS — M6281 Muscle weakness (generalized): Secondary | ICD-10-CM

## 2021-11-07 DIAGNOSIS — R2681 Unsteadiness on feet: Secondary | ICD-10-CM

## 2021-11-07 NOTE — Therapy (Signed)
OUTPATIENT PHYSICAL THERAPY TREATMENT NOTE/Physical Therapy Progress Note   Dates of reporting period  09/05/2021   to   11/07/2021     Patient Name: Nicholas [name] MRN: [id number] DOB:12-Dec-1945, 76 y.o., male Today's Date: 11/07/2021  PCP: Rusty Aus, MD REFERRING PROVIDER: Rusty Aus, MD   PT End of Session - 11/07/21 1028     Visit Number 60    Number of Visits 73    Date for PT Re-Evaluation 11/25/21    PT Start Time 1103    PT Stop Time 1148    PT Time Calculation (min) 45 min    Equipment Utilized During Treatment Gait belt    Activity Tolerance Patient tolerated treatment well;No increased pain    Behavior During Therapy [facility] for tasks assessed/performed                       Past Medical History:  Diagnosis Date   A-fib ([place])    Cancer ([place]) 10/25/2019   Prostate   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   Dysrhythmia    Pulmonary embolism (HCC)    Skin cancer    TIA (transient ischemic attack)    Past Surgical History:  Procedure Laterality Date   APPENDECTOMY     COLONOSCOPY WITH PROPOFOL N/A 11/23/2019   Procedure: COLONOSCOPY WITH PROPOFOL;  Surgeon: Toledo, Benay Pike, MD;  Location: [facility] ENDOSCOPY;  Service: Gastroenterology;  Laterality: N/A;   cyber knife surgery     PROSTATE SURGERY     Patient Active Problem List   Diagnosis Date Noted   COVID-19 virus infection 02/17/2021   A-fib ([place])    TIA (transient ischemic attack)    Lactic acidosis    Hyponatremia    Hypotension    Generalized weakness     REFERRING DIAG: Other symptoms and signs involving the musculoskeletal system  THERAPY DIAG:  Unsteadiness on feet  Muscle weakness (generalized)  Difficulty in walking, not elsewhere classified  Rationale for Evaluation and Treatment Rehabilitation  PERTINENT HISTORY: Pt is a 76 y/o male, returning to PT following admission to ED for evaluation of weakness s/p fall where pt was found to be COVID positive. Pt was in the  hospital from 02/17/2021-02/23/2021. Pt was then d/c to home where he received [facility]. Pt reports increased LBP since d/c from hospital and decreased balance since last in outpatient PT. While pt using QC currently, he reports he has been using a RW at home or grabbing onto the wall to steady himself while walking. Pt reports he has been walking home distances. PMH significant for TIA, a-fib, pulmonary embolism, TIA, gout, rhabdomyolysis.   PRECAUTIONS: fall risk  SUBJECTIVE: Pt states, "four nights of being woken up with foot pains." Reports none of his usual methods to improve his pain worked. He called his doctor and was prescribed gabapentin. He reported this has so far helped his sleep and pain. PAIN:  Are you having pain? No   TODAY'S TREATMENT:  11/07/2021 - Gait belt donned and CGA provided throughout unless otherwise noted  Goals reviewed for progress note. PT instructed pt through technique with testing and indications of performance. See goal section below for details.  Other interventions-  TherEx:  Ankle alphabet 1 round B  Seated DF 20x 3 sec holds B  Standing heel raises 23x B, 5x RLE  TherAct-  Pt performs several minutes of the following:   Floor to stand transfer training from red mat to standing with UE  support on mat table. Continued practice in transitioning from seated on floor>quadruped> half-kneeling to stand with BUE support to assist to stand. Close CGA provided throughout. Pt rates intervention as medium today.  NMR: Static stand BOSU round side up 2x60 sec FWD lunge on bosu (static) 2x11 each LE Slow marches for SLB 2x22 alt LE Intermittent UE support throughout  No knee/LE pain with interventions   PATIENT EDUCATION:  Education details: exercise technique, body mechanics, instruction to perform standing march at counter support at home and to perform reps slowly Person educated: Patient Education method: Consulting civil engineer, Demonstration, and Verbal  cues Education comprehension: verbalized understanding, returned demonstration, verbal cues required, and needs further education   HOME EXERCISE PROGRAM: 8/31: instruction in slow marching in front of counter for support.  09/12/21: [employer] HEP - Seated Row 2 sets of 15 reps at a weight that feels like a moderate challenge 2. Seated hamstring curls 2 sets of 15 reps at a weight that feels like a moderate challenge 3. Seated leg press 2 sets of 15 reps at a weight that feels like a moderate challenge 4. Seated leg press heel raises 2 sets of 15 reps at a weight that feels like a moderate challenge Pictures of interventions also provided to pt in handout.  1/9: Access Code: [email or web address]; 4/27: Access Code: [email or web address]   PT Short Term Goals       PT SHORT TERM GOAL #1   Title Patient will be independent in home exercise program to improve balance, and strength/mobility for better functional independence with ADLs and decreased fall risk.    Baseline 03/14/21: to be initiated within next 1-2 sessions 2/6: HEP compliance    Time 6    Period Weeks    Status Achieved    Target Date 06/06/21      PT SHORT TERM GOAL #2   Title Pt will improve gross BLE strength to 5/5 in order to increase ease and safety with mobility and ADLs.    Baseline 03/14/21: strength 4+/5 B, greatest deficit in B ankle DFs 4-/5 B 2/6: grossly 4+/5 with df 4/5 3/13: see note; 5/22: grossly 4+/5 bilat, weakness still most prominent in PF, 6/26: grossly 5/5, weakness prominent in DF (4-/5); 8/31: grossly 5/5 all mm except DFs 4/5   Time 6    Period Weeks    Status Partially Met    Target Date 09/09/21              PT Long Term Goals       PT LONG TERM GOAL #1   Title Patient will increase FOTO score to greater than 59 to demonstrate statistically significant improvement in mobility and quality of life.    Baseline 03/14/21: 58 2/6: 57% 3/13: 57.4%; 3/30: 62    Time 12    Period Weeks    Status Achieved    Target Date  06/06/21      PT LONG TERM GOAL #2   Title Patient (> 78 years old) will complete five times sit to stand test in < 10 seconds indicating increased LE strength and decreased fall risk    Baseline 03/14/21: 12 seconds 2/6: 12 seconds 3/13: 13.8 seconds without hands; 3/30: 13 sec; 4/1; 5/22: 12 sec hands free, 6/26: 12 sec hands free; 8/31: 11 seconds hands-free, some decreased eccentric control   Time 12    Period Weeks    Status Partially Met    Target Date 11/25/2021      PT LONG TERM  GOAL #3   Title Pt will improve DGI by at least 3 points in order to demonstrate clinically significant improvement in balance and decreased risk for falls.    Baseline 03/14/21: 15/24 2/6: 17/24 3/13: 19/24; 3/30: 21/24    Time 12    Period Weeks    Status Achieved    Target Date 06/06/21      PT LONG TERM GOAL #4   Title Pt will increase 6MWT by at least 53m(1649f in order to demonstrate clinically significant improvement in cardiopulmonary endurance and community ambulation    Baseline 03/14/21: 795 ft with QC 2/6: 865 ft without AD 3/13: 770 ft with walking stick with knee pain; 3/30: 1056 ft    Time 12    Period Weeks    Status Achieved    Target Date 08/29/21      PT LONG TERM GOAL #5   Title The patient will be able to maintain >5 seconds of SLB on BLEs in order to decrease risk of falls when navigating obstacles, curbs and steps.    Baseline 03/14/21: RLE 4 seconds, LLE 2 seconds 2/6: L 3 seconds R 4 seconds 3/13: L 4 seconds with pain R 15 seconds; 3/30: L 4 sec R 8 sec; 5/22: 2 sec RLE, 3 sec LLE; 6/26: LLE 11 sec, RLE 7 sec   Time 12    Period Weeks    Status MET   Target Date 08/29/21      PT LONG TERM GOAL #6   Title Patient will increase 10 meter walk test to at least 1.0 m/s with LRD as to improve gait speed for better community ambulation and to reduce fall risk.    Baseline 03/14/21: 0.67 m/s 2/6: 1.3 m/s    Time 12    Period Weeks    Status Achieved    Target Date 06/06/21     PT  LONG TERM GOAL #7  Title Pt will improve bilateral DF strength to 5/5 in order to increase ease and safety with mobility and ADLs.  Baseline 6/26: 4-/5; 8/31: 4/5  Time 12   Period Weeks   Status New  Target Date 11/25/21                 Plan     Clinical Impression Statement Goals reassessed for progress note. Pt making steady and modest gains toward majority of remaining PT goals, indicating improved LE strength. Pt with no change in FOTO score on this date, reflecting continued difficulty with certain balance activities .The pt continues to benefit from further skilled PT to improve balance and mobility to decrease fall risk and improve QoL.    Personal Factors and Comorbidities Age;Past/Current Experience;Comorbidity 3+    Comorbidities A-fib, lumbar disc disease, prostate CA, TIA, gout, hx of pulmonary embolism    Examination-Activity Limitations Carry;Lift;Stand;Locomotion Level;Bend;Dressing;Reach Overhead;Squat;Transfers;Caring for Others;Stairs    Examination-Participation Restrictions Community Activity;Yard Work;Cleaning;Laundry;Shop;Church    Stability/Clinical Decision Making Evolving/Moderate complexity    Rehab Potential Good    PT Frequency 2x / week    PT Duration 12 weeks    PT Treatment/Interventions ADLs/Self Care Home Management;Biofeedback;Aquatic Therapy;Canalith Repostioning;Cryotherapy;Electrical Stimulation;Iontophoresis 74m11ml Dexamethasone;Moist Heat;Traction;Ultrasound;Gait training;Stair training;DME Instruction;Functional mobility training;Therapeutic activities;Therapeutic exercise;Balance training;Neuromuscular re-education;Patient/family education;Manual techniques;Passive range of motion;Dry needling;Energy conservation;Vestibular;Joint Manipulations;Orthotic Fit/Training;Splinting;Taping;Visual/perceptual remediation/compensation    PT Next Visit Plan Dynamic & static balance, transfers to/from floor, SLB progressions  continue POC        Consulted  and Agree with Plan of Care Patient  Zollie Pee, PT 11/07/2021, 12:01 PM

## 2021-11-14 ENCOUNTER — Ambulatory Visit: Payer: Medicare Other | Attending: Internal Medicine

## 2021-11-14 DIAGNOSIS — R2681 Unsteadiness on feet: Secondary | ICD-10-CM | POA: Diagnosis present

## 2021-11-14 DIAGNOSIS — M6281 Muscle weakness (generalized): Secondary | ICD-10-CM | POA: Diagnosis present

## 2021-11-14 DIAGNOSIS — R262 Difficulty in walking, not elsewhere classified: Secondary | ICD-10-CM | POA: Diagnosis present

## 2021-11-14 NOTE — Therapy (Signed)
OUTPATIENT PHYSICAL THERAPY TREATMENT NOTE     Patient Name: Nicholas [name] MRN: [id number] DOB:[birth year], 76 y.o., male Today's Date: 11/14/2021  PCP: Rusty Aus, MD REFERRING PROVIDER: Rusty Aus, MD   PT End of Session - 11/14/21 1104     Visit Number 61    Number of Visits 73    Date for PT Re-Evaluation 11/25/21    PT Start Time 1102    PT Stop Time 1147    PT Time Calculation (min) 45 min    Equipment Utilized During Treatment Gait belt    Activity Tolerance Patient tolerated treatment well;No increased pain    Behavior During Therapy [facility] for tasks assessed/performed                        Past Medical History:  Diagnosis Date   A-fib ([place])    Cancer ([place]) 10/25/2019   Prostate   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   Dysrhythmia    Pulmonary embolism (HCC)    Skin cancer    TIA (transient ischemic attack)    Past Surgical History:  Procedure Laterality Date   APPENDECTOMY     COLONOSCOPY WITH PROPOFOL N/A 11/23/2019   Procedure: COLONOSCOPY WITH PROPOFOL;  Surgeon: Toledo, Benay Pike, MD;  Location: [facility] ENDOSCOPY;  Service: Gastroenterology;  Laterality: N/A;   cyber knife surgery     PROSTATE SURGERY     Patient Active Problem List   Diagnosis Date Noted   COVID-19 virus infection 02/17/2021   A-fib ([place])    TIA (transient ischemic attack)    Lactic acidosis    Hyponatremia    Hypotension    Generalized weakness     REFERRING DIAG: Other symptoms and signs involving the musculoskeletal system  THERAPY DIAG:  Unsteadiness on feet  Muscle weakness (generalized)  Difficulty in walking, not elsewhere classified  Rationale for Evaluation and Treatment Rehabilitation  PERTINENT HISTORY: Pt is a 76 y/o male, returning to PT following admission to ED for evaluation of weakness s/p fall where pt was found to be COVID positive. Pt was in the hospital from 02/17/2021-02/23/2021. Pt was then d/c to home where he received [facility]. Pt  reports increased LBP since d/c from hospital and decreased balance since last in outpatient PT. While pt using QC currently, he reports he has been using a RW at home or grabbing onto the wall to steady himself while walking. Pt reports he has been walking home distances. PMH significant for TIA, a-fib, pulmonary embolism, TIA, gout, rhabdomyolysis.   PRECAUTIONS: fall risk  SUBJECTIVE: Pt plans to see a neurologist for his foot pain/neuropathy. No falls or stumbles reported. No other changes reported.   PAIN:  Are you having pain? No   TODAY'S TREATMENT:  11/14/2021 - Gait belt donned and CGA provided throughout unless otherwise noted   TherEx:  Heel raises 30x, 26x B  Standing, knee flexed -  DF 26x each LE; difficulty with technique, compensation with rocking  Seated DF 26x B  TherAct-  Pt performs several minutes of the following:   Floor to stand transfer training from red mat to standing with UE support on mat table. Continued practice in transitioning from seated on floor>quadruped> half-kneeling to stand with BUE support to assist to stand. Close CGA provided throughout. Pt continues to rate intervention as medium.  NMR: -Ariex beam slow march for SLB progression x multiple times length of airex beam. -SLB progression, standing on airex and tapping  on 2 cones placed in front x multiple reps each LE -SLB 4x30 sec  Comments: intermittent UE support on bars throughout and up to min assist provided.  No knee/LE pain with interventions  PT adds seated DF exercise to HEP to be an area of focus (see below)    PATIENT EDUCATION:  Education details: exercise technique, body mechanics, instruction to perform standing march at counter support at home and to perform reps slowly Person educated: Patient Education method: Explanation, Demonstration, and Verbal cues Education comprehension: verbalized understanding, returned demonstration, verbal cues required, and needs further  education   HOME EXERCISE PROGRAM:  9/7: Access Code: [email or web address] URL: [email or web address] Date: 11/14/2021 Prepared by: Ricard Dillon  Exercises - Seated Toe Raise  - 1 x daily - 5 x weekly - 2 sets - 25 reps - 1 second hold  8/31: instruction in slow marching in front of counter for support.  09/12/21: [employer] HEP - Seated Row 2 sets of 15 reps at a weight that feels like a moderate challenge 2. Seated hamstring curls 2 sets of 15 reps at a weight that feels like a moderate challenge 3. Seated leg press 2 sets of 15 reps at a weight that feels like a moderate challenge 4. Seated leg press heel raises 2 sets of 15 reps at a weight that feels like a moderate challenge Pictures of interventions also provided to pt in handout.  1/9: Access Code: [email or web address]; 4/27: Access Code: [email or web address]   PT Short Term Goals       PT SHORT TERM GOAL #1   Title Patient will be independent in home exercise program to improve balance, and strength/mobility for better functional independence with ADLs and decreased fall risk.    Baseline 03/14/21: to be initiated within next 1-2 sessions 2/6: HEP compliance    Time 6    Period Weeks    Status Achieved    Target Date 06/06/21      PT SHORT TERM GOAL #2   Title Pt will improve gross BLE strength to 5/5 in order to increase ease and safety with mobility and ADLs.    Baseline 03/14/21: strength 4+/5 B, greatest deficit in B ankle DFs 4-/5 B 2/6: grossly 4+/5 with df 4/5 3/13: see note; 5/22: grossly 4+/5 bilat, weakness still most prominent in PF, 6/26: grossly 5/5, weakness prominent in DF (4-/5); 8/31: grossly 5/5 all mm except DFs 4/5   Time 6    Period Weeks    Status Partially Met    Target Date 09/09/21              PT Long Term Goals       PT LONG TERM GOAL #1   Title Patient will increase FOTO score to greater than 59 to demonstrate statistically significant improvement in mobility and quality of life.    Baseline 03/14/21: 58 2/6:  57% 3/13: 57.4%; 3/30: 62    Time 12    Period Weeks    Status Achieved    Target Date 06/06/21      PT LONG TERM GOAL #2   Title Patient (> 23 years old) will complete five times sit to stand test in < 10 seconds indicating increased LE strength and decreased fall risk    Baseline 03/14/21: 12 seconds 2/6: 12 seconds 3/13: 13.8 seconds without hands; 3/30: 13 sec; 4/1; 5/22: 12 sec hands free, 6/26: 12 sec hands free; 8/31: 11 seconds hands-free, some decreased eccentric control   Time 12  Period Weeks    Status Partially Met    Target Date 11/25/2021      PT LONG TERM GOAL #3   Title Pt will improve DGI by at least 3 points in order to demonstrate clinically significant improvement in balance and decreased risk for falls.    Baseline 03/14/21: 15/24 2/6: 17/24 3/13: 19/24; 3/30: 21/24    Time 12    Period Weeks    Status Achieved    Target Date 06/06/21      PT LONG TERM GOAL #4   Title Pt will increase 6MWT by at least 53m(1631f in order to demonstrate clinically significant improvement in cardiopulmonary endurance and community ambulation    Baseline 03/14/21: 795 ft with QC 2/6: 865 ft without AD 3/13: 770 ft with walking stick with knee pain; 3/30: 1056 ft    Time 12    Period Weeks    Status Achieved    Target Date 08/29/21      PT LONG TERM GOAL #5   Title The patient will be able to maintain >5 seconds of SLB on BLEs in order to decrease risk of falls when navigating obstacles, curbs and steps.    Baseline 03/14/21: RLE 4 seconds, LLE 2 seconds 2/6: L 3 seconds R 4 seconds 3/13: L 4 seconds with pain R 15 seconds; 3/30: L 4 sec R 8 sec; 5/22: 2 sec RLE, 3 sec LLE; 6/26: LLE 11 sec, RLE 7 sec   Time 12    Period Weeks    Status MET   Target Date 08/29/21      PT LONG TERM GOAL #6   Title Patient will increase 10 meter walk test to at least 1.0 m/s with LRD as to improve gait speed for better community ambulation and to reduce fall risk.    Baseline 03/14/21: 0.67 m/s 2/6: 1.3  m/s    Time 12    Period Weeks    Status Achieved    Target Date 06/06/21     PT LONG TERM GOAL #7  Title Pt will improve bilateral DF strength to 5/5 in order to increase ease and safety with mobility and ADLs.  Baseline 6/26: 4-/5; 8/31: 4/5  Time 12   Period Weeks   Status New  Target Date 11/25/21                 Plan     Clinical Impression Statement DF exercise added to HEP today to address remaining deficit in this area. Pt attempted both standing and seated versions within session, and had significant difficulty completing DF in standing due to weakness. Pt also practiced floor transfers during appointment, and continues to rate this exercise as medium difficulty. Pt required rest breaks throughout due to fatigue. The pt will  benefit from further skilled PT to improve balance and mobility and to decrease fall risk and improve QoL.    Personal Factors and Comorbidities Age;Past/Current Experience;Comorbidity 3+    Comorbidities A-fib, lumbar disc disease, prostate CA, TIA, gout, hx of pulmonary embolism    Examination-Activity Limitations Carry;Lift;Stand;Locomotion Level;Bend;Dressing;Reach Overhead;Squat;Transfers;Caring for Others;Stairs    Examination-Participation Restrictions Community Activity;Yard Work;Cleaning;Laundry;Shop;Church    Stability/Clinical Decision Making Evolving/Moderate complexity    Rehab Potential Good    PT Frequency 2x / week    PT Duration 12 weeks    PT Treatment/Interventions ADLs/Self Care Home Management;Biofeedback;Aquatic Therapy;Canalith Repostioning;Cryotherapy;Electrical Stimulation;Iontophoresis 58m58ml Dexamethasone;Moist Heat;Traction;Ultrasound;Gait training;Stair training;DME Instruction;Functional mobility training;Therapeutic activities;Therapeutic exercise;Balance training;Neuromuscular re-education;Patient/family education;Manual techniques;Passive range of motion;Dry  needling;Energy conservation;Vestibular;Joint  Manipulations;Orthotic Fit/Training;Splinting;Taping;Visual/perceptual remediation/compensation    PT Next Visit Plan Dynamic & static balance, transfers to/from floor, SLB progressions  continue POC        Consulted and Agree with Plan of Care Patient              Zollie Pee, PT 11/14/2021, 4:17 PM

## 2021-11-18 ENCOUNTER — Ambulatory Visit: Payer: Medicare Other

## 2021-11-21 ENCOUNTER — Ambulatory Visit: Payer: Medicare Other

## 2021-11-21 DIAGNOSIS — R2681 Unsteadiness on feet: Secondary | ICD-10-CM | POA: Diagnosis not present

## 2021-11-21 DIAGNOSIS — R262 Difficulty in walking, not elsewhere classified: Secondary | ICD-10-CM

## 2021-11-21 DIAGNOSIS — M6281 Muscle weakness (generalized): Secondary | ICD-10-CM

## 2021-11-21 NOTE — Therapy (Signed)
OUTPATIENT PHYSICAL THERAPY TREATMENT NOTE     Patient Name: Nicholas [name] MRN: [id number] DOB:1945/11/13, 76 y.o., male Today's Date: 11/21/2021  PCP: Rusty Aus, MD REFERRING PROVIDER: Rusty Aus, MD   PT End of Session - 11/21/21 1125     Visit Number 62    Number of Visits 73    Date for PT Re-Evaluation 11/25/21    PT Start Time 1104    PT Stop Time 1146    PT Time Calculation (min) 42 min    Equipment Utilized During Treatment Gait belt    Activity Tolerance Patient tolerated treatment well;No increased pain    Behavior During Therapy [facility] for tasks assessed/performed                         Past Medical History:  Diagnosis Date   A-fib ([place])    Cancer ([place]) 10/25/2019   Prostate   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   Dysrhythmia    Pulmonary embolism (HCC)    Skin cancer    TIA (transient ischemic attack)    Past Surgical History:  Procedure Laterality Date   APPENDECTOMY     COLONOSCOPY WITH PROPOFOL N/A 11/23/2019   Procedure: COLONOSCOPY WITH PROPOFOL;  Surgeon: Toledo, Benay Pike, MD;  Location: [facility] ENDOSCOPY;  Service: Gastroenterology;  Laterality: N/A;   cyber knife surgery     PROSTATE SURGERY     Patient Active Problem List   Diagnosis Date Noted   COVID-19 virus infection 02/17/2021   A-fib ([place])    TIA (transient ischemic attack)    Lactic acidosis    Hyponatremia    Hypotension    Generalized weakness     REFERRING DIAG: Other symptoms and signs involving the musculoskeletal system  THERAPY DIAG:  Muscle weakness (generalized)  Unsteadiness on feet  Difficulty in walking, not elsewhere classified  Rationale for Evaluation and Treatment Rehabilitation  PERTINENT HISTORY: Pt is a 76 y/o male, returning to PT following admission to ED for evaluation of weakness s/p fall where pt was found to be COVID positive. Pt was in the hospital from 02/17/2021-02/23/2021. Pt was then d/c to home where he received [facility] PT.  Pt reports increased LBP since d/c from hospital and decreased balance since last in outpatient PT. While pt using QC currently, he reports he has been using a RW at home or grabbing onto the wall to steady himself while walking. Pt reports he has been walking home distances. PMH significant for TIA, a-fib, pulmonary embolism, TIA, gout, rhabdomyolysis.   PRECAUTIONS: fall risk  SUBJECTIVE: Pt unsure if balance is improving. He reports he has gotten better sleep since starting gabapentin due to improved comfort in his feet.   PAIN:  Are you having pain? No   TODAY'S TREATMENT:  11/21/2021 - Gait belt donned and CGA provided throughout unless otherwise noted   TherEx: Treadmill endurance training at 2% elevation 1.5-2.5 mph in 30-60 sec intervals for therapeutic range and cardio challenge x 8 min total. Pt fatigued by end of intervention, requiring brief rest break.   TherAct-  Pt performs several minutes of the following:   Floor to stand transfer training from red mat to standing with UE support on mat table. Continued practice in transitioning from seated on floor>quadruped> half-kneeling to stand with BUE support to assist to stand, also practiced in part and whole. Close CGA provided throughout.   NMR: at support surface SLB 4x30 sec each LE; intermittent UE  support throughout Airex beam marching - 2x20 alt LE Sustained heel raise 2x30 sec- Intermittent UE support EC on airex beam 4x30 sec; requires intermittent UE support and up to min assist   PATIENT EDUCATION:  Education details: exercise technique, body mechanics, instruction to perform standing march at counter support at home and to perform reps slowly Person educated: Patient Education method: Consulting civil engineer, Demonstration, and Verbal cues Education comprehension: verbalized understanding, returned demonstration, verbal cues required, and needs further education   HOME EXERCISE PROGRAM: No updates today, pt to continue  HEP as previously given  9/7: Access Code: [email or web address] URL: [email or web address] Date: 11/14/2021 Prepared by: Ricard Dillon  Exercises - Seated Toe Raise  - 1 x daily - 5 x weekly - 2 sets - 25 reps - 1 second hold  8/31: instruction in slow marching in front of counter for support.  09/12/21: [employer] HEP - Seated Row 2 sets of 15 reps at a weight that feels like a moderate challenge 2. Seated hamstring curls 2 sets of 15 reps at a weight that feels like a moderate challenge 3. Seated leg press 2 sets of 15 reps at a weight that feels like a moderate challenge 4. Seated leg press heel raises 2 sets of 15 reps at a weight that feels like a moderate challenge Pictures of interventions also provided to pt in handout.  1/9: Access Code: [email or web address]; 4/27: Access Code: [email or web address]   PT Short Term Goals       PT SHORT TERM GOAL #1   Title Patient will be independent in home exercise program to improve balance, and strength/mobility for better functional independence with ADLs and decreased fall risk.    Baseline 03/14/21: to be initiated within next 1-2 sessions 2/6: HEP compliance    Time 6    Period Weeks    Status Achieved    Target Date 06/06/21      PT SHORT TERM GOAL #2   Title Pt will improve gross BLE strength to 5/5 in order to increase ease and safety with mobility and ADLs.    Baseline 03/14/21: strength 4+/5 B, greatest deficit in B ankle DFs 4-/5 B 2/6: grossly 4+/5 with df 4/5 3/13: see note; 5/22: grossly 4+/5 bilat, weakness still most prominent in PF, 6/26: grossly 5/5, weakness prominent in DF (4-/5); 8/31: grossly 5/5 all mm except DFs 4/5   Time 6    Period Weeks    Status Partially Met    Target Date 09/09/21              PT Long Term Goals       PT LONG TERM GOAL #1   Title Patient will increase FOTO score to greater than 59 to demonstrate statistically significant improvement in mobility and quality of life.    Baseline 03/14/21: 58 2/6: 57% 3/13:  57.4%; 3/30: 62    Time 12    Period Weeks    Status Achieved    Target Date 06/06/21      PT LONG TERM GOAL #2   Title Patient (> 77 years old) will complete five times sit to stand test in < 10 seconds indicating increased LE strength and decreased fall risk    Baseline 03/14/21: 12 seconds 2/6: 12 seconds 3/13: 13.8 seconds without hands; 3/30: 13 sec; 4/1; 5/22: 12 sec hands free, 6/26: 12 sec hands free; 8/31: 11 seconds hands-free, some decreased eccentric control   Time 12    Period Weeks    Status Partially Met  Target Date 11/25/2021      PT LONG TERM GOAL #3   Title Pt will improve DGI by at least 3 points in order to demonstrate clinically significant improvement in balance and decreased risk for falls.    Baseline 03/14/21: 15/24 2/6: 17/24 3/13: 19/24; 3/30: 21/24    Time 12    Period Weeks    Status Achieved    Target Date 06/06/21      PT LONG TERM GOAL #4   Title Pt will increase 6MWT by at least 438m(1628f in order to demonstrate clinically significant improvement in cardiopulmonary endurance and community ambulation    Baseline 03/14/21: 795 ft with QC 2/6: 865 ft without AD 3/13: 770 ft with walking stick with knee pain; 3/30: 1056 ft    Time 12    Period Weeks    Status Achieved    Target Date 08/29/21      PT LONG TERM GOAL #5   Title The patient will be able to maintain >5 seconds of SLB on BLEs in order to decrease risk of falls when navigating obstacles, curbs and steps.    Baseline 03/14/21: RLE 4 seconds, LLE 2 seconds 2/6: L 3 seconds R 4 seconds 3/13: L 4 seconds with pain R 15 seconds; 3/30: L 4 sec R 8 sec; 5/22: 2 sec RLE, 3 sec LLE; 6/26: LLE 11 sec, RLE 7 sec   Time 12    Period Weeks    Status MET   Target Date 08/29/21      PT LONG TERM GOAL #6   Title Patient will increase 10 meter walk test to at least 1.0 m/s with LRD as to improve gait speed for better community ambulation and to reduce fall risk.    Baseline 03/14/21: 0.67 m/s 2/6: 1.3 m/s     Time 12    Period Weeks    Status Achieved    Target Date 06/06/21     PT LONG TERM GOAL #7  Title Pt will improve bilateral DF strength to 5/5 in order to increase ease and safety with mobility and ADLs.  Baseline 6/26: 4-/5; 8/31: 4/5  Time 12   Period Weeks   Status New  Target Date 11/25/21                 Plan     Clinical Impression Statement Continued plan focusing on improving technique and ease with floor transfers. Pt able to complete multiple reps, however, still fatigues and rates intervention medium. Pt also performed multiple balance interventions, still requires intermittent UE support with majority and CGA-min assist. The pt will  benefit from further skilled PT to improve balance and mobility and to decrease fall risk and improve QoL.    Personal Factors and Comorbidities Age;Past/Current Experience;Comorbidity 3+    Comorbidities A-fib, lumbar disc disease, prostate CA, TIA, gout, hx of pulmonary embolism    Examination-Activity Limitations Carry;Lift;Stand;Locomotion Level;Bend;Dressing;Reach Overhead;Squat;Transfers;Caring for Others;Stairs    Examination-Participation Restrictions Community Activity;Yard Work;Cleaning;Laundry;Shop;Church    Stability/Clinical Decision Making Evolving/Moderate complexity    Rehab Potential Good    PT Frequency 2x / week    PT Duration 12 weeks    PT Treatment/Interventions ADLs/Self Care Home Management;Biofeedback;Aquatic Therapy;Canalith Repostioning;Cryotherapy;Electrical Stimulation;Iontophoresis 38m1ml Dexamethasone;Moist Heat;Traction;Ultrasound;Gait training;Stair training;DME Instruction;Functional mobility training;Therapeutic activities;Therapeutic exercise;Balance training;Neuromuscular re-education;Patient/family education;Manual techniques;Passive range of motion;Dry needling;Energy conservation;Vestibular;Joint Manipulations;Orthotic Fit/Training;Splinting;Taping;Visual/perceptual remediation/compensation    PT Next  Visit Plan Dynamic & static balance, transfers to/from floor, SLB progressions  continue POC  Consulted and Agree with Plan of Care Patient              Zollie Pee, PT 11/21/2021, 3:01 PM

## 2021-11-25 ENCOUNTER — Ambulatory Visit: Payer: Medicare Other

## 2021-11-28 ENCOUNTER — Ambulatory Visit: Payer: Medicare Other

## 2021-11-28 DIAGNOSIS — R2681 Unsteadiness on feet: Secondary | ICD-10-CM | POA: Diagnosis not present

## 2021-11-28 DIAGNOSIS — M6281 Muscle weakness (generalized): Secondary | ICD-10-CM

## 2021-11-28 NOTE — Therapy (Signed)
OUTPATIENT PHYSICAL THERAPY TREATMENT NOTE/RECERT     Patient Name: Nicholas [name] MRN: [id number] DOB:02-Apr-1945, 76 y.o., male Today's Date: 11/28/2021  PCP: Rusty Aus, MD REFERRING PROVIDER: Rusty Aus, MD   PT End of Session - 11/28/21 1536     Visit Number 63    Number of Visits 73    Date for PT Re-Evaluation 02/06/22    PT Start Time 1103    PT Stop Time 1146    PT Time Calculation (min) 43 min    Equipment Utilized During Treatment Gait belt    Activity Tolerance Patient tolerated treatment well;No increased pain    Behavior During Therapy [facility] for tasks assessed/performed                          Past Medical History:  Diagnosis Date   A-fib ([place])    Cancer ([place]) 10/25/2019   Prostate   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   Dysrhythmia    Pulmonary embolism (HCC)    Skin cancer    TIA (transient ischemic attack)    Past Surgical History:  Procedure Laterality Date   APPENDECTOMY     COLONOSCOPY WITH PROPOFOL N/A 11/23/2019   Procedure: COLONOSCOPY WITH PROPOFOL;  Surgeon: Toledo, Benay Pike, MD;  Location: [facility] ENDOSCOPY;  Service: Gastroenterology;  Laterality: N/A;   cyber knife surgery     PROSTATE SURGERY     Patient Active Problem List   Diagnosis Date Noted   COVID-19 virus infection 02/17/2021   A-fib ([place])    TIA (transient ischemic attack)    Lactic acidosis    Hyponatremia    Hypotension    Generalized weakness     REFERRING DIAG: Other symptoms and signs involving the musculoskeletal system  THERAPY DIAG:  Unsteadiness on feet  Muscle weakness (generalized)  Rationale for Evaluation and Treatment Rehabilitation  PERTINENT HISTORY: Pt is a 76 y/o male, returning to PT following admission to ED for evaluation of weakness s/p fall where pt was found to be COVID positive. Pt was in the hospital from 02/17/2021-02/23/2021. Pt was then d/c to home where he received [facility] PT. Pt reports increased LBP since d/c from  hospital and decreased balance since last in outpatient PT. While pt using QC currently, he reports he has been using a RW at home or grabbing onto the wall to steady himself while walking. Pt reports he has been walking home distances. PMH significant for TIA, a-fib, pulmonary embolism, TIA, gout, rhabdomyolysis.   PRECAUTIONS: fall risk  SUBJECTIVE: Pt states he feels "less steady on my knees." Pt reports when he was getting dressed he noticed he had a tendency to go backwards. Pt reports no falls. He has not done HEP this week. He notes improvement with his back symptoms, stating, "I have noticed that my back is not noticeable. My back is not achey like it used to be."   PAIN:  Are you having pain? No   TODAY'S TREATMENT:  11/28/2021 - Gait belt donned and CGA provided throughout unless otherwise noted TherAct: Goal retesting completed on this date for recert. PT instructed pt in technique with testing and indications of test performance throughout. Please refer to goal section for details.   FOTO: 62 (unchanged). Pt still reports remaining deficits include difficulty with turning, or walking sideways, difficulty standing up from low surfaces.  Floor to stand transfer training from red mat to standing with UE support on mat table. Continued practice  in transitioning from seated on floor>quadruped> half-kneeling to stand with BUE support to assist to stand only able to complete 2 reps as pt became dizzy and required a rest break.  BP taken with pt seated: 127/82 mmHg LUE. Symptoms improved with rest. No further interventions completed.  TherEx: STS 2x5   NMR: progressed STS to performing 8 reps on airex, cuing for anterior weight-shift  On half-foam DF x multiple bouts of 30 sec (recreates backwards LOB), PF 2x30 sec  SLB x multiple 30 sec bouts each LE. Requires up to min assist, intermittent UE support    PATIENT EDUCATION:  Education details: exercise technique, body  mechanics, goals, plan, frequency Person educated: Patient Education method: Explanation, Demonstration, and Verbal cues Education comprehension: verbalized understanding, returned demonstration, verbal cues required, and needs further education   HOME EXERCISE PROGRAM: No updates today, pt to continue HEP as previously given  9/7: Access Code: [email or web address] URL: [email or web address] Date: 11/14/2021 Prepared by: Ricard Dillon  Exercises - Seated Toe Raise  - 1 x daily - 5 x weekly - 2 sets - 25 reps - 1 second hold  8/31: instruction in slow marching in front of counter for support.  09/12/21: [employer] HEP - Seated Row 2 sets of 15 reps at a weight that feels like a moderate challenge 2. Seated hamstring curls 2 sets of 15 reps at a weight that feels like a moderate challenge 3. Seated leg press 2 sets of 15 reps at a weight that feels like a moderate challenge 4. Seated leg press heel raises 2 sets of 15 reps at a weight that feels like a moderate challenge Pictures of interventions also provided to pt in handout.  1/9: Access Code: [email or web address]; 4/27: Access Code: [email or web address]   PT Short Term Goals       PT SHORT TERM GOAL #1   Title Patient will be independent in home exercise program to improve balance, and strength/mobility for better functional independence with ADLs and decreased fall risk.    Baseline 03/14/21: to be initiated within next 1-2 sessions 2/6: HEP compliance    Time 6    Period Weeks    Status Achieved    Target Date 06/06/21      PT SHORT TERM GOAL #2   Title Pt will improve gross BLE strength to 5/5 in order to increase ease and safety with mobility and ADLs.    Baseline 03/14/21: strength 4+/5 B, greatest deficit in B ankle DFs 4-/5 B 2/6: grossly 4+/5 with df 4/5 3/13: see note; 5/22: grossly 4+/5 bilat, weakness still most prominent in PF, 6/26: grossly 5/5, weakness prominent in DF (4-/5); 8/31: grossly 5/5 all mm except DFs 4/5; 9/21:DF R 4-/5, L 4+/5     Time 10   Period Weeks    Status Partially Met    Target Date 02/06/2022             PT Long Term Goals       PT LONG TERM GOAL #1   Title Patient will increase FOTO score to greater than 59 to demonstrate statistically significant improvement in mobility and quality of life.    Baseline 03/14/21: 58 2/6: 57% 3/13: 57.4%; 3/30: 62    Time 12    Period Weeks    Status Achieved    Target Date 06/06/21      PT LONG TERM GOAL #2   Title Patient (> 58 years old) will complete five times sit to stand test in < 10  seconds indicating increased LE strength and decreased fall risk    Baseline 03/14/21: 12 seconds 2/6: 12 seconds 3/13: 13.8 seconds without hands; 3/30: 13 sec; 4/1; 5/22: 12 sec hands free, 6/26: 12 sec hands free; 8/31: 11 seconds hands-free, some decreased eccentric control; 9/21: 11 sec hands- free   Time 10   Period Weeks    Status Partially Met    Target Date 02/06/2022      PT LONG TERM GOAL #3   Title Pt will improve DGI by at least 3 points in order to demonstrate clinically significant improvement in balance and decreased risk for falls.    Baseline 03/14/21: 15/24 2/6: 17/24 3/13: 19/24; 3/30: 21/24    Time 12    Period Weeks    Status Achieved    Target Date 06/06/21      PT LONG TERM GOAL #4   Title Pt will increase 6MWT by at least 50m(1669f in order to demonstrate clinically significant improvement in cardiopulmonary endurance and community ambulation    Baseline 03/14/21: 795 ft with QC 2/6: 865 ft without AD 3/13: 770 ft with walking stick with knee pain; 3/30: 1056 ft    Time 12    Period Weeks    Status Achieved    Target Date 08/29/21      PT LONG TERM GOAL #5   Title The patient will be able to maintain >5 seconds of SLB on BLEs in order to decrease risk of falls when navigating obstacles, curbs and steps.    Baseline 03/14/21: RLE 4 seconds, LLE 2 seconds 2/6: L 3 seconds R 4 seconds 3/13: L 4 seconds with pain R 15 seconds; 3/30: L 4 sec R 8 sec;  5/22: 2 sec RLE, 3 sec LLE; 6/26: LLE 11 sec, RLE 7 sec   Time 12    Period Weeks    Status MET   Target Date 08/29/21      PT LONG TERM GOAL #6   Title Patient will increase 10 meter walk test to at least 1.0 m/s with LRD as to improve gait speed for better community ambulation and to reduce fall risk.    Baseline 03/14/21: 0.67 m/s 2/6: 1.3 m/s    Time 12    Period Weeks    Status Achieved    Target Date 06/06/21     PT LONG TERM GOAL #7  Title Pt will improve bilateral DF strength to 5/5 in order to increase ease and safety with mobility and ADLs.  Baseline 6/26: 4-/5; 8/31: 4/5; 9/21: R 4-/5, L 4+/5   Time 10  Period Weeks   Status ONGOING  Target Date 02/06/2022                 Plan     Clinical Impression Statement Goals reassessed for recert. Pt with slight decrease in DF strength today and no change in FOTO score compared to previous assessment. Pt did present to session seemingly more fatigued than usual and floor transfer intervention did have to be discontinued as pt became dizzy (this improved with rest). Pt scores may have slightly been impacted by this, however, he seems to have reached plateau with progress. PT discussed reducing frequency by removing a few appointments following next session for trial period prior to dc from PT. PT [facility] Pt agreeable to plan and verbalized understanding. The pt will  benefit from further skilled PT to improve balance and mobility  and to decrease fall risk and improve QoL.    Personal Factors and Comorbidities Age;Past/Current Experience;Comorbidity 3+    Comorbidities A-fib, lumbar disc disease, prostate CA, TIA, gout, hx of pulmonary embolism    Examination-Activity Limitations Carry;Lift;Stand;Locomotion Level;Bend;Dressing;Reach Overhead;Squat;Transfers;Caring for Others;Stairs    Examination-Participation Restrictions Community Activity;Yard  Work;Cleaning;Laundry;Shop;Church    Stability/Clinical Decision Making Evolving/Moderate complexity    Rehab Potential Good    PT Frequency 1x / week    PT Duration 10 weeks    PT Treatment/Interventions ADLs/Self Care Home Management;Biofeedback;Aquatic Therapy;Canalith Repostioning;Cryotherapy;Electrical Stimulation;Iontophoresis 62m/ml Dexamethasone;Moist Heat;Traction;Ultrasound;Gait training;Stair training;DME Instruction;Functional mobility training;Therapeutic activities;Therapeutic exercise;Balance training;Neuromuscular re-education;Patient/family education;Manual techniques;Passive range of motion;Dry needling;Energy conservation;Vestibular;Joint Manipulations;Orthotic Fit/Training;Splinting;Taping;Visual/perceptual remediation/compensation    PT Next Visit Plan Dynamic & static balance, transfers to/from floor, SLB progressions  continue POC        Consulted and Agree with Plan of Care Patient              HZollie Pee PT 11/28/2021, 5:01 PM

## 2021-12-02 ENCOUNTER — Ambulatory Visit: Payer: Medicare Other

## 2021-12-03 ENCOUNTER — Ambulatory Visit (INDEPENDENT_AMBULATORY_CARE_PROVIDER_SITE_OTHER): Payer: Medicare Other | Admitting: Dermatology

## 2021-12-03 DIAGNOSIS — L570 Actinic keratosis: Secondary | ICD-10-CM | POA: Diagnosis not present

## 2021-12-03 DIAGNOSIS — L821 Other seborrheic keratosis: Secondary | ICD-10-CM | POA: Diagnosis not present

## 2021-12-03 DIAGNOSIS — L578 Other skin changes due to chronic exposure to nonionizing radiation: Secondary | ICD-10-CM | POA: Diagnosis not present

## 2021-12-03 DIAGNOSIS — L820 Inflamed seborrheic keratosis: Secondary | ICD-10-CM | POA: Diagnosis not present

## 2021-12-03 NOTE — Patient Instructions (Signed)
Cryotherapy Aftercare  Wash gently with soap and water everyday.   Apply Vaseline and Band-Aid daily until healed.     Due to recent changes in healthcare laws, you may see results of your pathology and/or laboratory studies on MyChart before the doctors have had a chance to review them. We understand that in some cases there may be results that are confusing or concerning to you. Please understand that not all results are received at the same time and often the doctors may need to interpret multiple results in order to provide you with the best plan of care or course of treatment. Therefore, we ask that you please give us 2 business days to thoroughly review all your results before contacting the office for clarification. Should we see a critical lab result, you will be contacted sooner.   If You Need Anything After Your Visit  If you have any questions or concerns for your doctor, please call our main line at [phone] and press option 4 to reach your doctor's medical assistant. If no one answers, please leave a voicemail as directed and we will return your call as soon as possible. Messages left after 4 pm will be answered the following business day.   You may also send us a message via MyChart. We typically respond to MyChart messages within 1-2 business days.  For prescription refills, please ask your pharmacy to contact our office. Our fax number is [phone].  If you have an urgent issue when the clinic is closed that cannot wait until the next business day, you can page your doctor at the number below.    Please note that while we do our best to be available for urgent issues outside of office hours, we are not available 24/7.   If you have an urgent issue and are unable to reach us, you may choose to seek medical care at your doctor's office, retail clinic, urgent care center, or emergency room.  If you have a medical emergency, please immediately call 911 or go to the  emergency department.  Pager Numbers  - Dr. Kowalski: [phone]  - Dr. Moye: [phone]  - Dr. Stewart: [phone]  In the event of inclement weather, please call our main line at [phone] for an update on the status of any delays or closures.  Dermatology Medication Tips: Please keep the boxes that topical medications come in in order to help keep track of the instructions about where and how to use these. Pharmacies typically print the medication instructions only on the boxes and not directly on the medication tubes.   If your medication is too expensive, please contact our office at [phone] option 4 or send us a message through MyChart.   We are unable to tell what your co-pay for medications will be in advance as this is different depending on your insurance coverage. However, we may be able to find a substitute medication at lower cost or fill out paperwork to get insurance to cover a needed medication.   If a prior authorization is required to get your medication covered by your insurance company, please allow us 1-2 business days to complete this process.  Drug prices often vary depending on where the prescription is filled and some pharmacies may offer cheaper prices.  The website www.goodrx.com contains coupons for medications through different pharmacies. The prices here do not account for what the cost may be with help from insurance (it may be cheaper with your insurance), but the website can   give you the price if you did not use any insurance.  - You can print the associated coupon and take it with your prescription to the pharmacy.  - You may also stop by our office during regular business hours and pick up a GoodRx coupon card.  - If you need your prescription sent electronically to a different pharmacy, notify our office through [facility] MyChart or by phone at [phone] option 4.     Si Usted Necesita Algo Despus de Su Visita  Tambin puede  enviarnos un mensaje a travs de MyChart. Por lo general respondemos a los mensajes de MyChart en el transcurso de 1 a 2 das hbiles.  Para renovar recetas, por favor pida a su farmacia que se ponga en contacto con nuestra oficina. Nuestro nmero de fax es el [phone].  Si tiene un asunto urgente cuando la clnica est cerrada y que no puede esperar hasta el siguiente da hbil, puede llamar/localizar a su doctor(a) al nmero que aparece a continuacin.   Por favor, tenga en cuenta que aunque hacemos todo lo posible para estar disponibles para asuntos urgentes fuera del horario de oficina, no estamos disponibles las 24 horas del da, los 7 das de la semana.   Si tiene un problema urgente y no puede comunicarse con nosotros, puede optar por buscar atencin mdica  en el consultorio de su doctor(a), en una clnica privada, en un centro de atencin urgente o en una sala de emergencias.  Si tiene una emergencia mdica, por favor llame inmediatamente al 911 o vaya a la sala de emergencias.  Nmeros de bper  - Dr. Kowalski: [phone]  - Dra. Moye: [phone]  - Dra. Stewart: [phone]  En caso de inclemencias del tiempo, por favor llame a nuestra lnea principal al [phone] para una actualizacin sobre el estado de cualquier retraso o cierre.  Consejos para la medicacin en dermatologa: Por favor, guarde las cajas en las que vienen los medicamentos de uso tpico para ayudarle a seguir las instrucciones sobre dnde y cmo usarlos. Las farmacias generalmente imprimen las instrucciones del medicamento slo en las cajas y no directamente en los tubos del medicamento.   Si su medicamento es muy caro, por favor, pngase en contacto con nuestra oficina llamando al [phone] y presione la opcin 4 o envenos un mensaje a travs de MyChart.   No podemos decirle cul ser su copago por los medicamentos por adelantado ya que esto es diferente dependiendo de la cobertura de su seguro.  Sin embargo, es posible que podamos encontrar un medicamento sustituto a menor costo o llenar un formulario para que el seguro cubra el medicamento que se considera necesario.   Si se requiere una autorizacin previa para que su compaa de seguros cubra su medicamento, por favor permtanos de 1 a 2 das hbiles para completar este proceso.  Los precios de los medicamentos varan con frecuencia dependiendo del lugar de dnde se surte la receta y alguna farmacias pueden ofrecer precios ms baratos.  El sitio web www.goodrx.com tiene cupones para medicamentos de diferentes farmacias. Los precios aqu no tienen en cuenta lo que podra costar con la ayuda del seguro (puede ser ms barato con su seguro), pero el sitio web puede darle el precio si no utiliz ningn seguro.  - Puede imprimir el cupn correspondiente y llevarlo con su receta a la farmacia.  - Tambin puede pasar por nuestra oficina durante el horario de atencin regular y recoger una tarjeta de cupones de GoodRx.  -   Si necesita que su receta se enve electrnicamente a una farmacia diferente, informe a nuestra oficina a travs de MyChart de [place] o por telfono llamando al [phone] y presione la opcin 4.  

## 2021-12-03 NOTE — Progress Notes (Unsigned)
   Follow-Up Visit   Subjective  Nicholas [name] is a 76 y.o. male who presents for the following: Actinic Keratosis (3 month follow up of nose treated with LN2 and 5FU/Calcipotriene) and Follow-up (ISK 3 month follow up of neck treated with LN2 x 22). The patient has spots, moles and lesions to be evaluated, some may be new or changing and the patient has concerns that these could be cancer.  Accompanied by wife  The following portions of the chart were reviewed this encounter and updated as appropriate:   Tobacco  Allergies  Meds  Problems  Med Hx  Surg Hx  Fam Hx     Review of Systems:  No other skin or systemic complaints except as noted in HPI or Assessment and Plan.  Objective  Well appearing patient in no apparent distress; mood and affect are within normal limits.  A focused examination was performed including face, scalp, trunk. Relevant physical exam findings are noted in the Assessment and Plan.  Right nose Erythematous thin papules/macules with gritty scale.   Right forearm x 2, back  x 16 (18) Erythematous stuck-on, waxy papule or plaque   Assessment & Plan  AK (actinic keratosis) Right nose  Destruction of lesion - Right nose Complexity: simple   Destruction method: cryotherapy   Informed consent: discussed and consent obtained   Timeout:  patient name, date of birth, surgical site, and procedure verified Lesion destroyed using liquid nitrogen: Yes   Region frozen until ice ball extended beyond lesion: Yes   Outcome: patient tolerated procedure well with no complications   Post-procedure details: wound care instructions given    Inflamed seborrheic keratosis (18) Right forearm x 2, back  x 16  Destruction of lesion - Right forearm x 2, back  x 16 Complexity: simple   Destruction method: cryotherapy   Informed consent: discussed and consent obtained   Timeout:  patient name, date of birth, surgical site, and procedure verified Lesion destroyed  using liquid nitrogen: Yes   Region frozen until ice ball extended beyond lesion: Yes   Outcome: patient tolerated procedure well with no complications   Post-procedure details: wound care instructions given    Actinic Damage - chronic, secondary to cumulative UV radiation exposure/sun exposure over time - diffuse scaly erythematous macules with underlying dyspigmentation - Recommend daily broad spectrum sunscreen SPF 30+ to sun-exposed areas, reapply every 2 hours as needed.  - Recommend staying in the shade or wearing long sleeves, sun glasses (UVA+UVB protection) and wide brim hats (4-inch brim around the entire circumference of the hat). - Call for new or changing lesions.  Seborrheic Keratoses - Stuck-on, waxy, tan-brown papules and/or plaques  - Benign-appearing - Discussed benign etiology and prognosis. - Observe - Call for any changes  Return in about 6 months (around 06/03/2022) for AK follow up, ISK follow up.  I, Ashok Cordia, CMA, am acting as scribe for Sarina Ser, MD . Documentation: I have reviewed the above documentation for accuracy and completeness, and I agree with the above.  Sarina Ser, MD

## 2021-12-04 ENCOUNTER — Encounter: Payer: Self-pay | Admitting: Dermatology

## 2021-12-05 ENCOUNTER — Ambulatory Visit: Payer: Medicare Other

## 2021-12-05 DIAGNOSIS — R262 Difficulty in walking, not elsewhere classified: Secondary | ICD-10-CM

## 2021-12-05 DIAGNOSIS — M6281 Muscle weakness (generalized): Secondary | ICD-10-CM

## 2021-12-05 DIAGNOSIS — R2681 Unsteadiness on feet: Secondary | ICD-10-CM | POA: Diagnosis not present

## 2021-12-05 NOTE — Therapy (Signed)
OUTPATIENT PHYSICAL THERAPY TREATMENT NOTE     Patient Name: Nicholas [name] MRN: [id number] DOB:06/11/1945, 76 y.o., male Today's Date: 12/05/2021  PCP: Rusty Aus, MD REFERRING PROVIDER: Rusty Aus, MD   PT End of Session - 12/05/21 1110     Visit Number [id number]    Number of Visits 73    Date for PT Re-Evaluation 02/06/22    PT Start Time 1110    PT Stop Time 1145    PT Time Calculation (min) 35 min    Equipment Utilized During Treatment Gait belt    Activity Tolerance Patient tolerated treatment well;No increased pain    Behavior During Therapy [facility] for tasks assessed/performed                           Past Medical History:  Diagnosis Date   A-fib ([place])    Cancer ([place]) 10/25/2019   Prostate   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   Dysrhythmia    Pulmonary embolism ([facility])    Skin cancer    TIA (transient ischemic attack)    Past Surgical History:  Procedure Laterality Date   APPENDECTOMY     COLONOSCOPY WITH PROPOFOL N/A 11/23/2019   Procedure: COLONOSCOPY WITH PROPOFOL;  Surgeon: Toledo, Benay Pike, MD;  Location: [facility] ENDOSCOPY;  Service: Gastroenterology;  Laterality: N/A;   cyber knife surgery     PROSTATE SURGERY     Patient Active Problem List   Diagnosis Date Noted   COVID-19 virus infection 02/17/2021   A-fib ([place])    TIA (transient ischemic attack)    Lactic acidosis    Hyponatremia    Hypotension    Generalized weakness     REFERRING DIAG: Other symptoms and signs involving the musculoskeletal system  THERAPY DIAG:  Muscle weakness (generalized)  Unsteadiness on feet  Difficulty in walking, not elsewhere classified  Rationale for Evaluation and Treatment Rehabilitation  PERTINENT HISTORY: Pt is a 76 y/o male, returning to PT following admission to ED for evaluation of weakness s/p fall where pt was found to be COVID positive. Pt was in the hospital from 02/17/2021-02/23/2021. Pt was then d/c to home where he received [facility]  PT. Pt reports increased LBP since d/c from hospital and decreased balance since last in outpatient PT. While pt using QC currently, he reports he has been using a RW at home or grabbing onto the wall to steady himself while walking. Pt reports he has been walking home distances. PMH significant for TIA, a-fib, pulmonary embolism, TIA, gout, rhabdomyolysis.   PRECAUTIONS: fall risk  SUBJECTIVE: Pt feeling better today, reports no other instances of dizziness/lightheadedness. Does report noticing some mild knee pain recently, but no pain reported currently.   PAIN:  Are you having pain? No   TODAY'S TREATMENT:  12/05/2021 - Gait belt donned and CGA provided throughout unless otherwise noted NMR: In // bars. BLE on airex to create low surface and to provide balance challenge 6 reps x 2 sets. Rates hard. Mildly unsteady with majority of reps, must utilize intermittent UE support to steady self  Airex beam side stepping and FWD tandem gait, with turning x multiple reps; intermittent  UE support throughout   Airex pad 360 deg turns 2x each direction and 1/4 turns x multiple reps each direction - intermittent UE support and mod a throughout.  One foot on floor one on 6" step for SLB progression 60 sec each LE. Intermittent UE support, use of  mid-guard   TherAct- Floor to stand transfer training from red mat to standing with UE support on mat table. Continued part and whole practice in transitioning from seated on floor>quadruped> half-kneeling to stand with BUE support to assist to stand. No lightheadedness today, able to complete multiple reps. Rates intervention medium.   PATIENT EDUCATION:  Education details: exercise technique, body mechanics, goals, plan, frequency Person educated: Patient Education method: Explanation, Demonstration, and Verbal cues Education comprehension: verbalized understanding, returned demonstration, verbal cues required, and needs further education   HOME  EXERCISE PROGRAM: No updates today, pt to continue HEP as previously given  9/7: Access Code: [email or web address] URL: [email or web address] Date: 11/14/2021 Prepared by: Ricard Dillon  Exercises - Seated Toe Raise  - 1 x daily - 5 x weekly - 2 sets - 25 reps - 1 second hold  8/31: instruction in slow marching in front of counter for support.  09/12/21: [employer] HEP - Seated Row 2 sets of 15 reps at a weight that feels like a moderate challenge 2. Seated hamstring curls 2 sets of 15 reps at a weight that feels like a moderate challenge 3. Seated leg press 2 sets of 15 reps at a weight that feels like a moderate challenge 4. Seated leg press heel raises 2 sets of 15 reps at a weight that feels like a moderate challenge Pictures of interventions also provided to pt in handout.  1/9: Access Code: [email or web address]; 4/27: Access Code: [email or web address]   PT Short Term Goals       PT SHORT TERM GOAL #1   Title Patient will be independent in home exercise program to improve balance, and strength/mobility for better functional independence with ADLs and decreased fall risk.    Baseline 03/14/21: to be initiated within next 1-2 sessions 2/6: HEP compliance    Time 6    Period Weeks    Status Achieved    Target Date 06/06/21      PT SHORT TERM GOAL #2   Title Pt will improve gross BLE strength to 5/5 in order to increase ease and safety with mobility and ADLs.    Baseline 03/14/21: strength 4+/5 B, greatest deficit in B ankle DFs 4-/5 B 2/6: grossly 4+/5 with df 4/5 3/13: see note; 5/22: grossly 4+/5 bilat, weakness still most prominent in PF, 6/26: grossly 5/5, weakness prominent in DF (4-/5); 8/31: grossly 5/5 all mm except DFs 4/5; 9/21:DF R 4-/5, L 4+/5    Time 10   Period Weeks    Status Partially Met    Target Date 02/06/2022             PT Long Term Goals       PT LONG TERM GOAL #1   Title Patient will increase FOTO score to greater than 59 to demonstrate statistically significant improvement  in mobility and quality of life.    Baseline 03/14/21: 58 2/6: 57% 3/13: 57.4%; 3/30: 62    Time 12    Period Weeks    Status Achieved    Target Date 06/06/21      PT LONG TERM GOAL #2   Title Patient (> 53 years old) will complete five times sit to stand test in < 10 seconds indicating increased LE strength and decreased fall risk    Baseline 03/14/21: 12 seconds 2/6: 12 seconds 3/13: 13.8 seconds without hands; 3/30: 13 sec; 4/1; 5/22: 12 sec hands free, 6/26: 12 sec hands free; 8/31: 11 seconds hands-free, some decreased eccentric control; 9/21: 11 sec  hands- free   Time 10   Period Weeks    Status Partially Met    Target Date 02/06/2022      PT LONG TERM GOAL #3   Title Pt will improve DGI by at least 3 points in order to demonstrate clinically significant improvement in balance and decreased risk for falls.    Baseline 03/14/21: 15/24 2/6: 17/24 3/13: 19/24; 3/30: 21/24    Time 12    Period Weeks    Status Achieved    Target Date 06/06/21      PT LONG TERM GOAL #4   Title Pt will increase 6MWT by at least 23m(1651f in order to demonstrate clinically significant improvement in cardiopulmonary endurance and community ambulation    Baseline 03/14/21: 795 ft with QC 2/6: 865 ft without AD 3/13: 770 ft with walking stick with knee pain; 3/30: 1056 ft    Time 12    Period Weeks    Status Achieved    Target Date 08/29/21      PT LONG TERM GOAL #5   Title The patient will be able to maintain >5 seconds of SLB on BLEs in order to decrease risk of falls when navigating obstacles, curbs and steps.    Baseline 03/14/21: RLE 4 seconds, LLE 2 seconds 2/6: L 3 seconds R 4 seconds 3/13: L 4 seconds with pain R 15 seconds; 3/30: L 4 sec R 8 sec; 5/22: 2 sec RLE, 3 sec LLE; 6/26: LLE 11 sec, RLE 7 sec   Time 12    Period Weeks    Status MET   Target Date 08/29/21      PT LONG TERM GOAL #6   Title Patient will increase 10 meter walk test to at least 1.0 m/s with LRD as to improve gait speed for  better community ambulation and to reduce fall risk.    Baseline 03/14/21: 0.67 m/s 2/6: 1.3 m/s    Time 12    Period Weeks    Status Achieved    Target Date 06/06/21     PT LONG TERM GOAL #7  Title Pt will improve bilateral DF strength to 5/5 in order to increase ease and safety with mobility and ADLs.  Baseline 6/26: 4-/5; 8/31: 4/5; 9/21: R 4-/5, L 4+/5   Time 10  Period Weeks   Status ONGOING  Target Date 02/06/2022                 Plan     Clinical Impression Statement Session somewhat limited secondary to pt bathroom break just prior to start of PT. However, pt feeling better today and motivated to participate. He shows progress with floor transfer training, rating activity as medium, with no lightheadedness, and reports he feels he is able to complete floor transfers more quickly. While pt shows progress, he continues to be challenged with compliant surface training, and is noted to have difficulty maintaining stability with turning. The pt will  benefit from further skilled PT to improve balance and mobility and to decrease fall risk and improve QoL.    Personal Factors and Comorbidities Age;Past/Current Experience;Comorbidity 3+    Comorbidities A-fib, lumbar disc disease, prostate CA, TIA, gout, hx of pulmonary embolism    Examination-Activity Limitations Carry;Lift;Stand;Locomotion Level;Bend;Dressing;Reach Overhead;Squat;Transfers;Caring for Others;Stairs    Examination-Participation Restrictions Community Activity;Yard Work;Cleaning;Laundry;Shop;Church    Stability/Clinical Decision Making Evolving/Moderate complexity    Rehab Potential Good    PT Frequency 1x / week    PT Duration 10 weeks  PT Treatment/Interventions ADLs/Self Care Home Management;Biofeedback;Aquatic Therapy;Canalith Repostioning;Cryotherapy;Electrical Stimulation;Iontophoresis 62m/ml Dexamethasone;Moist Heat;Traction;Ultrasound;Gait training;Stair training;DME Instruction;Functional mobility  training;Therapeutic activities;Therapeutic exercise;Balance training;Neuromuscular re-education;Patient/family education;Manual techniques;Passive range of motion;Dry needling;Energy conservation;Vestibular;Joint Manipulations;Orthotic Fit/Training;Splinting;Taping;Visual/perceptual remediation/compensation    PT Next Visit Plan Dynamic & static balance, transfers to/from floor, SLB progressions  continue POC        Consulted and Agree with Plan of Care Patient              HZollie Pee PT 12/05/2021, 3:15 PM

## 2021-12-09 ENCOUNTER — Ambulatory Visit: Payer: Medicare Other

## 2021-12-12 ENCOUNTER — Ambulatory Visit: Payer: Medicare Other

## 2021-12-16 ENCOUNTER — Ambulatory Visit: Payer: Medicare Other

## 2021-12-19 ENCOUNTER — Ambulatory Visit: Payer: Medicare Other | Attending: Internal Medicine

## 2021-12-19 DIAGNOSIS — R2681 Unsteadiness on feet: Secondary | ICD-10-CM | POA: Insufficient documentation

## 2021-12-19 DIAGNOSIS — R278 Other lack of coordination: Secondary | ICD-10-CM | POA: Diagnosis present

## 2021-12-19 DIAGNOSIS — R262 Difficulty in walking, not elsewhere classified: Secondary | ICD-10-CM | POA: Diagnosis present

## 2021-12-19 DIAGNOSIS — M25562 Pain in left knee: Secondary | ICD-10-CM | POA: Insufficient documentation

## 2021-12-19 DIAGNOSIS — M6281 Muscle weakness (generalized): Secondary | ICD-10-CM | POA: Insufficient documentation

## 2021-12-19 NOTE — Therapy (Signed)
OUTPATIENT PHYSICAL THERAPY TREATMENT NOTE     Patient Name: Nicholas [name] MRN: [id number] DOB:September 18, 1945, 76 y.o., male Today's Date: 12/19/2021  PCP: Rusty Aus, MD REFERRING PROVIDER: Rusty Aus, MD   PT End of Session - 12/19/21 1537     Visit Number 65    Number of Visits 57    Date for PT Re-Evaluation 02/06/22    PT Start Time 1103    PT Stop Time 1146    PT Time Calculation (min) 43 min    Equipment Utilized During Treatment Gait belt    Activity Tolerance Patient tolerated treatment well;Patient limited by pain    Behavior During Therapy [facility] for tasks assessed/performed                            Past Medical History:  Diagnosis Date   A-fib ([place])    Cancer ([place]) 10/25/2019   Prostate   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   Dysrhythmia    Pulmonary embolism (HCC)    Skin cancer    TIA (transient ischemic attack)    Past Surgical History:  Procedure Laterality Date   APPENDECTOMY     COLONOSCOPY WITH PROPOFOL N/A 11/23/2019   Procedure: COLONOSCOPY WITH PROPOFOL;  Surgeon: Toledo, Benay Pike, MD;  Location: [facility] ENDOSCOPY;  Service: Gastroenterology;  Laterality: N/A;   cyber knife surgery     PROSTATE SURGERY     Patient Active Problem List   Diagnosis Date Noted   COVID-19 virus infection 02/17/2021   A-fib ([place])    TIA (transient ischemic attack)    Lactic acidosis    Hyponatremia    Hypotension    Generalized weakness     REFERRING DIAG: Other symptoms and signs involving the musculoskeletal system  THERAPY DIAG:  Acute pain of left knee  Muscle weakness (generalized)  Unsteadiness on feet  Difficulty in walking, not elsewhere classified  Rationale for Evaluation and Treatment Rehabilitation  PERTINENT HISTORY: Pt is a 76 y/o male, returning to PT following admission to ED for evaluation of weakness s/p fall where pt was found to be COVID positive. Pt was in the hospital from 02/17/2021-02/23/2021. Pt was  then d/c to home where he received [place] PT. Pt reports increased LBP since d/c from hospital and decreased balance since last in outpatient PT. While pt using QC currently, he reports he has been using a RW at home or grabbing onto the wall to steady himself while walking. Pt reports he has been walking home distances. PMH significant for TIA, a-fib, pulmonary embolism, TIA, gout, rhabdomyolysis.   PRECAUTIONS: fall risk  SUBJECTIVE: Pt presents using SPC with antalgic gait. He said he was sitting in his chair and L knee was really swollen. Pt met with his physician and had knee "drained" on 12/13/2021. Pt said knee improved after it was drained. Reports Monday knee started hurting again. Pt has brace he is wearing and reports this helps. Pt does reports no falls, but some stumbles. Pt reports walking around does help. Pt reports issue has reached plateau, has not improve or worsened. Pt reports his physician gave him prednisone.    PAIN:  Are you having pain? No   TODAY'S TREATMENT:   Gait belt donned and CGA provided throughout unless otherwise noted  TherEx: Nustep interval training, Cuing for SPM to at least 60, maintains in 50s, difficulty achieving 60 Lvl 1 x 1 min 30 sec  Lvl 3 x 1  min Lvl 4 x 1 min Lvl 5 x attempted but increased knee discomfort so decreased to lvl 4 Decreased to lvl 4 x 1 min - reports is comfortable   Seated DF 20x 2 sets B Seated PF 20x B   NMR: Airex: EC WBOS 4x30 sec  Performed again with head turns both vertical and horizontal 10x for each  EO NBOS with and without head turns x multiple reps of each  One foot on airex one on 6" step with horiz head turns 10x for each head turn in each LE position  Single leg cone taps (SLB progression) x multiple reps of each  Comments: intermittent UE support throughout  PATIENT EDUCATION:  Education details: exercise technique, body mechanics, goals, plan, frequency Person educated: Patient Education method:  Explanation, Demonstration, and Verbal cues Education comprehension: verbalized understanding, returned demonstration, verbal cues required, and needs further education   HOME EXERCISE PROGRAM: No updates today, pt to continue HEP as previously given  9/7: Access Code: [email or web address] URL: [email or web address] Date: 11/14/2021 Prepared by: Ricard Dillon  Exercises - Seated Toe Raise  - 1 x daily - 5 x weekly - 2 sets - 25 reps - 1 second hold  8/31: instruction in slow marching in front of counter for support.  09/12/21: [employer] HEP - Seated Row 2 sets of 15 reps at a weight that feels like a moderate challenge 2. Seated hamstring curls 2 sets of 15 reps at a weight that feels like a moderate challenge 3. Seated leg press 2 sets of 15 reps at a weight that feels like a moderate challenge 4. Seated leg press heel raises 2 sets of 15 reps at a weight that feels like a moderate challenge Pictures of interventions also provided to pt in handout.  1/9: Access Code: [email or web address]; 4/27: Access Code: [email or web address]   PT Short Term Goals       PT SHORT TERM GOAL #1   Title Patient will be independent in home exercise program to improve balance, and strength/mobility for better functional independence with ADLs and decreased fall risk.    Baseline 03/14/21: to be initiated within next 1-2 sessions 2/6: HEP compliance    Time 6    Period Weeks    Status Achieved    Target Date 06/06/21      PT SHORT TERM GOAL #2   Title Pt will improve gross BLE strength to 5/5 in order to increase ease and safety with mobility and ADLs.    Baseline 03/14/21: strength 4+/5 B, greatest deficit in B ankle DFs 4-/5 B 2/6: grossly 4+/5 with df 4/5 3/13: see note; 5/22: grossly 4+/5 bilat, weakness still most prominent in PF, 6/26: grossly 5/5, weakness prominent in DF (4-/5); 8/31: grossly 5/5 all mm except DFs 4/5; 9/21:DF R 4-/5, L 4+/5    Time 10   Period Weeks    Status Partially Met    Target Date 02/06/2022              PT Long Term Goals       PT LONG TERM GOAL #1   Title Patient will increase FOTO score to greater than 59 to demonstrate statistically significant improvement in mobility and quality of life.    Baseline 03/14/21: 58 2/6: 57% 3/13: 57.4%; 3/30: 62    Time 12    Period Weeks    Status Achieved    Target Date 06/06/21      PT LONG TERM GOAL #2   Title Patient (>  72 years old) will complete five times sit to stand test in < 10 seconds indicating increased LE strength and decreased fall risk    Baseline 03/14/21: 12 seconds 2/6: 12 seconds 3/13: 13.8 seconds without hands; 3/30: 13 sec; 4/1; 5/22: 12 sec hands free, 6/26: 12 sec hands free; 8/31: 11 seconds hands-free, some decreased eccentric control; 9/21: 11 sec hands- free   Time 10   Period Weeks    Status Partially Met    Target Date 02/06/2022      PT LONG TERM GOAL #3   Title Pt will improve DGI by at least 3 points in order to demonstrate clinically significant improvement in balance and decreased risk for falls.    Baseline 03/14/21: 15/24 2/6: 17/24 3/13: 19/24; 3/30: 21/24    Time 12    Period Weeks    Status Achieved    Target Date 06/06/21      PT LONG TERM GOAL #4   Title Pt will increase 6MWT by at least 82m(1662f in order to demonstrate clinically significant improvement in cardiopulmonary endurance and community ambulation    Baseline 03/14/21: 795 ft with QC 2/6: 865 ft without AD 3/13: 770 ft with walking stick with knee pain; 3/30: 1056 ft    Time 12    Period Weeks    Status Achieved    Target Date 08/29/21      PT LONG TERM GOAL #5   Title The patient will be able to maintain >5 seconds of SLB on BLEs in order to decrease risk of falls when navigating obstacles, curbs and steps.    Baseline 03/14/21: RLE 4 seconds, LLE 2 seconds 2/6: L 3 seconds R 4 seconds 3/13: L 4 seconds with pain R 15 seconds; 3/30: L 4 sec R 8 sec; 5/22: 2 sec RLE, 3 sec LLE; 6/26: LLE 11 sec, RLE 7 sec   Time 12    Period  Weeks    Status MET   Target Date 08/29/21      PT LONG TERM GOAL #6   Title Patient will increase 10 meter walk test to at least 1.0 m/s with LRD as to improve gait speed for better community ambulation and to reduce fall risk.    Baseline 03/14/21: 0.67 m/s 2/6: 1.3 m/s    Time 12    Period Weeks    Status Achieved    Target Date 06/06/21     PT LONG TERM GOAL #7  Title Pt will improve bilateral DF strength to 5/5 in order to increase ease and safety with mobility and ADLs.  Baseline 6/26: 4-/5; 8/31: 4/5; 9/21: R 4-/5, L 4+/5   Time 10  Period Weeks   Status ONGOING  Target Date 02/06/2022                 Plan     Clinical Impression Statement Interventions regressed as pt presents with L knee pain, s/p [place]ided aspiration of knee (12/13/21). He tolerated interventions well, and was able to perform majority without pain. He noted mild increase in pain with lvl 5 on nustep, which was immediatly decreased, resulting in improved comfort. Will continue to monitor. The pt will  benefit from further skilled PT to improve balance and mobility and to decrease fall risk and improve QoL.    Personal Factors and Comorbidities Age;Past/Current Experience;Comorbidity 3+    Comorbidities A-fib, lumbar disc disease, prostate CA, TIA, gout, hx of pulmonary embolism    Examination-Activity Limitations Carry;Lift;Stand;Locomotion  Level;Bend;Dressing;Reach Overhead;Squat;Transfers;Caring for Others;Stairs    Examination-Participation Restrictions Community Activity;Yard Work;Cleaning;Laundry;Shop;Church    Stability/Clinical Decision Making Evolving/Moderate complexity    Rehab Potential Good    PT Frequency 1x / week    PT Duration 10 weeks    PT Treatment/Interventions ADLs/Self Care Home Management;Biofeedback;Aquatic Therapy;Canalith Repostioning;Cryotherapy;Electrical Stimulation;Iontophoresis 47m/ml Dexamethasone;Moist Heat;Traction;Ultrasound;Gait training;Stair training;DME  Instruction;Functional mobility training;Therapeutic activities;Therapeutic exercise;Balance training;Neuromuscular re-education;Patient/family education;Manual techniques;Passive range of motion;Dry needling;Energy conservation;Vestibular;Joint Manipulations;Orthotic Fit/Training;Splinting;Taping;Visual/perceptual remediation/compensation    PT Next Visit Plan Dynamic & static balance, transfers to/from floor, SLB progressions  continue POC        Consulted and Agree with Plan of Care Patient              HZollie Pee PT 12/19/2021, 3:48 PM

## 2021-12-23 ENCOUNTER — Ambulatory Visit: Payer: Medicare Other

## 2021-12-26 ENCOUNTER — Ambulatory Visit: Payer: Medicare Other

## 2021-12-30 ENCOUNTER — Ambulatory Visit: Payer: Medicare Other

## 2022-01-02 ENCOUNTER — Ambulatory Visit: Payer: Medicare Other

## 2022-01-02 DIAGNOSIS — R2681 Unsteadiness on feet: Secondary | ICD-10-CM

## 2022-01-02 DIAGNOSIS — M25562 Pain in left knee: Secondary | ICD-10-CM | POA: Diagnosis not present

## 2022-01-02 DIAGNOSIS — M6281 Muscle weakness (generalized): Secondary | ICD-10-CM

## 2022-01-02 DIAGNOSIS — R278 Other lack of coordination: Secondary | ICD-10-CM

## 2022-01-02 NOTE — Therapy (Signed)
OUTPATIENT PHYSICAL THERAPY TREATMENT NOTE     Patient Name: Nicholas [name] MRN: [id number] DOB:09/18/1945, 76 y.o., male Today's Date: 01/02/2022  PCP: Rusty Aus, MD REFERRING PROVIDER: Rusty Aus, MD   PT End of Session - 01/02/22 1105     Visit Number 66    Number of Visits 73    Date for PT Re-Evaluation 02/06/22    PT Start Time 1101    PT Stop Time 1147    PT Time Calculation (min) 46 min    Equipment Utilized During Treatment Gait belt    Activity Tolerance Patient tolerated treatment well;Patient limited by pain;No increased pain    Behavior During Therapy [facility] for tasks assessed/performed                             Past Medical History:  Diagnosis Date   A-fib ([place])    Cancer ([place]) 10/25/2019   Prostate   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   Dysrhythmia    Pulmonary embolism (HCC)    Skin cancer    TIA (transient ischemic attack)    Past Surgical History:  Procedure Laterality Date   APPENDECTOMY     COLONOSCOPY WITH PROPOFOL N/A 11/23/2019   Procedure: COLONOSCOPY WITH PROPOFOL;  Surgeon: Toledo, Benay Pike, MD;  Location: [facility] ENDOSCOPY;  Service: Gastroenterology;  Laterality: N/A;   cyber knife surgery     PROSTATE SURGERY     Patient Active Problem List   Diagnosis Date Noted   COVID-19 virus infection 02/17/2021   A-fib ([place])    TIA (transient ischemic attack)    Lactic acidosis    Hyponatremia    Hypotension    Generalized weakness     REFERRING DIAG: Other symptoms and signs involving the musculoskeletal system  THERAPY DIAG:  Unsteadiness on feet  Other lack of coordination  Muscle weakness (generalized)  Rationale for Evaluation and Treatment Rehabilitation  PERTINENT HISTORY: Pt is a 76 y/o male, returning to PT following admission to ED for evaluation of weakness s/p fall where pt was found to be COVID positive. Pt was in the hospital from 02/17/2021-02/23/2021. Pt was then d/c to home where he  received [place] PT. Pt reports increased LBP since d/c from hospital and decreased balance since last in outpatient PT. While pt using QC currently, he reports he has been using a RW at home or grabbing onto the wall to steady himself while walking. Pt reports he has been walking home distances. PMH significant for TIA, a-fib, pulmonary embolism, TIA, gout, rhabdomyolysis.   PRECAUTIONS: fall risk  SUBJECTIVE: Pt doing better, knee is feeling better and he is ambulating without an AD. He is wearing a soft brace over knee.  Pt rates pain as 0/10. He is frustrated with balance with turning and when he gets up, denies lightheaded sensation.   PAIN:  Are you having pain? No   TODAY'S TREATMENT:   Gait belt donned and CGA provided throughout unless otherwise noted  NMR- Turning in corner 8x each direction. Mild unsteadiness noted, but pt able to self-correct.  One foot on Airex, one on floor at side of Airex 2x30 sec each LE. Very unsteady, requires intermittent UE support  Standing on airex:  NBOS with head turns, vertical and horizontal 2x10 turns for each  NBOS EC 4x30 sec - very unsteady, up to mod a and intermittent UE support  Standing on half-foam (round side up) 2x45 sec intermittent UE  support  -Pt then static stands in DF position (toes on half foam, heels on floor) with and without dual cog task 30 sec each -Pt then static stands in PF position (heels on foam, toes on floor) with and without dual cog task 30 sec each  SLB  2x30 sec intermittent UE support and up to min a due to unsteadiness  Tandem stance 2x30 each LE, first without dual cognitive task, then sequence repeated each LE with dual cog task  TherEx- Heel raises 3x20 B - minimal ROM Standing hip abd 2x20 each LE Seated DF 2x20 with 2 sec holds/rep . Reports more difficulty on the R compared to L    PATIENT EDUCATION:  Education details: exercise technique, body mechanics, plan for d/c in a few visits Person  educated: Patient Education method: Explanation, Demonstration, and Verbal cues Education comprehension: verbalized understanding, returned demonstration, verbal cues required, and needs further education   HOME EXERCISE PROGRAM: No updates today, pt to continue HEP as previously given  9/7: Access Code: [email or web address] URL: [email or web address] Date: 11/14/2021 Prepared by: Ricard Dillon  Exercises - Seated Toe Raise  - 1 x daily - 5 x weekly - 2 sets - 25 reps - 1 second hold  8/31: instruction in slow marching in front of counter for support.  09/12/21: [employer] HEP - Seated Row 2 sets of 15 reps at a weight that feels like a moderate challenge 2. Seated hamstring curls 2 sets of 15 reps at a weight that feels like a moderate challenge 3. Seated leg press 2 sets of 15 reps at a weight that feels like a moderate challenge 4. Seated leg press heel raises 2 sets of 15 reps at a weight that feels like a moderate challenge Pictures of interventions also provided to pt in handout.  1/9: Access Code: [email or web address]; 4/27: Access Code: [email or web address]   PT Short Term Goals       PT SHORT TERM GOAL #1   Title Patient will be independent in home exercise program to improve balance, and strength/mobility for better functional independence with ADLs and decreased fall risk.    Baseline 03/14/21: to be initiated within next 1-2 sessions 2/6: HEP compliance    Time 6    Period Weeks    Status Achieved    Target Date 06/06/21      PT SHORT TERM GOAL #2   Title Pt will improve gross BLE strength to 5/5 in order to increase ease and safety with mobility and ADLs.    Baseline 03/14/21: strength 4+/5 B, greatest deficit in B ankle DFs 4-/5 B 2/6: grossly 4+/5 with df 4/5 3/13: see note; 5/22: grossly 4+/5 bilat, weakness still most prominent in PF, 6/26: grossly 5/5, weakness prominent in DF (4-/5); 8/31: grossly 5/5 all mm except DFs 4/5; 9/21:DF R 4-/5, L 4+/5    Time 10   Period Weeks    Status  Partially Met    Target Date 02/06/2022             PT Long Term Goals       PT LONG TERM GOAL #1   Title Patient will increase FOTO score to greater than 59 to demonstrate statistically significant improvement in mobility and quality of life.    Baseline 03/14/21: 58 2/6: 57% 3/13: 57.4%; 3/30: 62    Time 12    Period Weeks    Status Achieved    Target Date 06/06/21      PT LONG TERM GOAL #2  Title Patient (> 59 years old) will complete five times sit to stand test in < 10 seconds indicating increased LE strength and decreased fall risk    Baseline 03/14/21: 12 seconds 2/6: 12 seconds 3/13: 13.8 seconds without hands; 3/30: 13 sec; 4/1; 5/22: 12 sec hands free, 6/26: 12 sec hands free; 8/31: 11 seconds hands-free, some decreased eccentric control; 9/21: 11 sec hands- free   Time 10   Period Weeks    Status Partially Met    Target Date 02/06/2022      PT LONG TERM GOAL #3   Title Pt will improve DGI by at least 3 points in order to demonstrate clinically significant improvement in balance and decreased risk for falls.    Baseline 03/14/21: 15/24 2/6: 17/24 3/13: 19/24; 3/30: 21/24    Time 12    Period Weeks    Status Achieved    Target Date 06/06/21      PT LONG TERM GOAL #4   Title Pt will increase 6MWT by at least 16m(1645f in order to demonstrate clinically significant improvement in cardiopulmonary endurance and community ambulation    Baseline 03/14/21: 795 ft with QC 2/6: 865 ft without AD 3/13: 770 ft with walking stick with knee pain; 3/30: 1056 ft    Time 12    Period Weeks    Status Achieved    Target Date 08/29/21      PT LONG TERM GOAL #5   Title The patient will be able to maintain >5 seconds of SLB on BLEs in order to decrease risk of falls when navigating obstacles, curbs and steps.    Baseline 03/14/21: RLE 4 seconds, LLE 2 seconds 2/6: L 3 seconds R 4 seconds 3/13: L 4 seconds with pain R 15 seconds; 3/30: L 4 sec R 8 sec; 5/22: 2 sec RLE, 3 sec LLE; 6/26: LLE  11 sec, RLE 7 sec   Time 12    Period Weeks    Status MET   Target Date 08/29/21      PT LONG TERM GOAL #6   Title Patient will increase 10 meter walk test to at least 1.0 m/s with LRD as to improve gait speed for better community ambulation and to reduce fall risk.    Baseline 03/14/21: 0.67 m/s 2/6: 1.3 m/s    Time 12    Period Weeks    Status Achieved    Target Date 06/06/21     PT LONG TERM GOAL #7  Title Pt will improve bilateral DF strength to 5/5 in order to increase ease and safety with mobility and ADLs.  Baseline 6/26: 4-/5; 8/31: 4/5; 9/21: R 4-/5, L 4+/5   Time 10  Period Weeks   Status ONGOING  Target Date 02/06/2022                 Plan     Clinical Impression Statement Pt L knee improved today, with pt no longer using AD to ambulate and reporting no pain. Pt still very unsteady with EC on compliant surface and SLB activities. He did report he is not performing HEP as much as he should at home, however, pt still goes to [employer] work out. Pt also still with significant weakness in DFs and PFs bilaterally. Improving this deficit is to be an area of focus for remaining visits. If pt still reporting unsteadiness with standing he would benefit from orthostatics screen. The pt will  benefit from further skilled PT to improve balance  and mobility and to decrease fall risk and improve QoL.    Personal Factors and Comorbidities Age;Past/Current Experience;Comorbidity 3+    Comorbidities A-fib, lumbar disc disease, prostate CA, TIA, gout, hx of pulmonary embolism    Examination-Activity Limitations Carry;Lift;Stand;Locomotion Level;Bend;Dressing;Reach Overhead;Squat;Transfers;Caring for Others;Stairs    Examination-Participation Restrictions Community Activity;Yard Work;Cleaning;Laundry;Shop;Church    Stability/Clinical Decision Making Evolving/Moderate complexity    Rehab Potential Good    PT Frequency 1x / week    PT Duration 10 weeks    PT Treatment/Interventions  ADLs/Self Care Home Management;Biofeedback;Aquatic Therapy;Canalith Repostioning;Cryotherapy;Electrical Stimulation;Iontophoresis 20m/ml Dexamethasone;Moist Heat;Traction;Ultrasound;Gait training;Stair training;DME Instruction;Functional mobility training;Therapeutic activities;Therapeutic exercise;Balance training;Neuromuscular re-education;Patient/family education;Manual techniques;Passive range of motion;Dry needling;Energy conservation;Vestibular;Joint Manipulations;Orthotic Fit/Training;Splinting;Taping;Visual/perceptual remediation/compensation    PT Next Visit Plan Dynamic & static balance, transfers to/from floor, SLB progressions  continue POC        Consulted and Agree with Plan of Care Patient              HZollie Pee PT 01/02/2022, 4:32 PM

## 2022-01-06 ENCOUNTER — Ambulatory Visit: Payer: Medicare Other

## 2022-01-09 ENCOUNTER — Ambulatory Visit: Payer: Medicare Other

## 2022-01-13 ENCOUNTER — Ambulatory Visit: Payer: Medicare Other

## 2022-01-16 ENCOUNTER — Ambulatory Visit: Payer: Medicare Other | Attending: Internal Medicine

## 2022-01-16 DIAGNOSIS — R2681 Unsteadiness on feet: Secondary | ICD-10-CM | POA: Insufficient documentation

## 2022-01-16 DIAGNOSIS — M6281 Muscle weakness (generalized): Secondary | ICD-10-CM | POA: Diagnosis present

## 2022-01-16 DIAGNOSIS — R278 Other lack of coordination: Secondary | ICD-10-CM | POA: Insufficient documentation

## 2022-01-16 NOTE — Therapy (Signed)
OUTPATIENT PHYSICAL THERAPY TREATMENT NOTE     Patient Name: Nicholas [name] MRN: [id number] DOB:[birth year], 76 y.o., male Today's Date: 01/16/2022  PCP: Rusty Aus, MD REFERRING PROVIDER: Rusty Aus, MD   PT End of Session - 01/16/22 1117     Visit Number 65    Number of Visits 80    Date for PT Re-Evaluation 02/06/22    Authorization Type Medicare Medcaid primaryd; BCBS secondary    Authorization Time Period 11/28/21-02/06/22    Progress Note Due on Visit 80    PT Start Time 1104    PT Stop Time 1144    PT Time Calculation (min) 40 min    Equipment Utilized During Treatment Gait belt    Activity Tolerance Patient tolerated treatment well;Patient limited by pain;No increased pain    Behavior During Therapy [facility] for tasks assessed/performed                             Past Medical History:  Diagnosis Date   A-fib ([place])    Cancer ([place]) 10/25/2019   Prostate   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   Dysrhythmia    Pulmonary embolism (HCC)    Skin cancer    TIA (transient ischemic attack)    Past Surgical History:  Procedure Laterality Date   APPENDECTOMY     COLONOSCOPY WITH PROPOFOL N/A 11/23/2019   Procedure: COLONOSCOPY WITH PROPOFOL;  Surgeon: Toledo, Benay Pike, MD;  Location: [facility] ENDOSCOPY;  Service: Gastroenterology;  Laterality: N/A;   cyber knife surgery     PROSTATE SURGERY     Patient Active Problem List   Diagnosis Date Noted   COVID-19 virus infection 02/17/2021   A-fib ([place])    TIA (transient ischemic attack)    Lactic acidosis    Hyponatremia    Hypotension    Generalized weakness     REFERRING DIAG: Other symptoms and signs involving the musculoskeletal system  THERAPY DIAG:  Unsteadiness on feet  Other lack of coordination  Muscle weakness (generalized)  Rationale for Evaluation and Treatment Rehabilitation  PERTINENT HISTORY: Pt is a 76 y/o male, returning to PT following admission to ED for evaluation of  weakness s/p fall where pt was found to be COVID positive. Pt was in the hospital from 02/17/2021-02/23/2021. Pt was then d/c to home where he received [facility]. Pt reports increased LBP since d/c from hospital and decreased balance since last in outpatient PT. While pt using QC currently, he reports he has been using a RW at home or grabbing onto the wall to steady himself while walking. Pt reports he has been walking home distances. PMH significant for TIA, a-fib, pulmonary embolism, TIA, gout, rhabdomyolysis.   PRECAUTIONS: fall risk  SUBJECTIVE: Pt still having issues with knees, more feelings of swelling and tightness than true pain. Pt feels like he is unable to stand for long periods at church. Pt reports a 30cc hemarthrosis aspiration at orthopedics office.   PAIN:  Are you having pain? No   TODAY'S TREATMENT: -17.5lb lateral cable resistance over tile, short rocker board, red mat 2x forward/backward Standing BP 148/1mHg, 82bpm -lateral stepping airex/to low rocker/gray step and reverse  -lateral stepping over tile, short rocker board, red mat    PATIENT EDUCATION:  Education details: exercise technique, body mechanics, plan for d/c in a few visits Person educated: Patient Education method: Explanation, Demonstration, and Verbal cues Education comprehension: verbalized understanding, returned demonstration, verbal cues required,  and needs further education   HOME EXERCISE PROGRAM: No updates today, pt to continue HEP as previously given  9/7: Access Code: [email or web address] URL: [email or web address] Date: 11/14/2021 Prepared by: Ricard Dillon  Exercises - Seated Toe Raise  - 1 x daily - 5 x weekly - 2 sets - 25 reps - 1 second hold  8/31: instruction in slow marching in front of counter for support.  09/12/21: [employer] HEP - Seated Row 2 sets of 15 reps at a weight that feels like a moderate challenge 2. Seated hamstring curls 2 sets of 15 reps at a weight that feels  like a moderate challenge 3. Seated leg press 2 sets of 15 reps at a weight that feels like a moderate challenge 4. Seated leg press heel raises 2 sets of 15 reps at a weight that feels like a moderate challenge Pictures of interventions also provided to pt in handout.  1/9: Access Code: [email or web address]; 4/27: Access Code: [email or web address]   PT Short Term Goals       PT SHORT TERM GOAL #1   Title Patient will be independent in home exercise program to improve balance, and strength/mobility for better functional independence with ADLs and decreased fall risk.    Baseline 03/14/21: to be initiated within next 1-2 sessions 2/6: HEP compliance    Time 6    Period Weeks    Status Achieved    Target Date 06/06/21      PT SHORT TERM GOAL #2   Title Pt will improve gross BLE strength to 5/5 in order to increase ease and safety with mobility and ADLs.    Baseline 03/14/21: strength 4+/5 B, greatest deficit in B ankle DFs 4-/5 B 2/6: grossly 4+/5 with df 4/5 3/13: see note; 5/22: grossly 4+/5 bilat, weakness still most prominent in PF, 6/26: grossly 5/5, weakness prominent in DF (4-/5); 8/31: grossly 5/5 all mm except DFs 4/5; 9/21:DF R 4-/5, L 4+/5    Time 10   Period Weeks    Status Partially Met    Target Date 02/06/2022             PT Long Term Goals       PT LONG TERM GOAL #1   Title Patient will increase FOTO score to greater than 59 to demonstrate statistically significant improvement in mobility and quality of life.    Baseline 03/14/21: 58 2/6: 57% 3/13: 57.4%; 3/30: 62    Time 12    Period Weeks    Status Achieved    Target Date 06/06/21      PT LONG TERM GOAL #2   Title Patient (> 65 years old) will complete five times sit to stand test in < 10 seconds indicating increased LE strength and decreased fall risk    Baseline 03/14/21: 12 seconds 2/6: 12 seconds 3/13: 13.8 seconds without hands; 3/30: 13 sec; 4/1; 5/22: 12 sec hands free, 6/26: 12 sec hands free; 8/31: 11 seconds hands-free, some  decreased eccentric control; 9/21: 11 sec hands- free   Time 10   Period Weeks    Status Partially Met    Target Date 02/06/2022      PT LONG TERM GOAL #3   Title Pt will improve DGI by at least 3 points in order to demonstrate clinically significant improvement in balance and decreased risk for falls.    Baseline 03/14/21: 15/24 2/6: 17/24 3/13: 19/24; 3/30: 21/24    Time 12    Period Weeks    Status Achieved  Target Date 06/06/21      PT LONG TERM GOAL #4   Title Pt will increase 6MWT by at least 72m(1659f in order to demonstrate clinically significant improvement in cardiopulmonary endurance and community ambulation    Baseline 03/14/21: 795 ft with QC 2/6: 865 ft without AD 3/13: 770 ft with walking stick with knee pain; 3/30: 1056 ft    Time 12    Period Weeks    Status Achieved    Target Date 08/29/21      PT LONG TERM GOAL #5   Title The patient will be able to maintain >5 seconds of SLB on BLEs in order to decrease risk of falls when navigating obstacles, curbs and steps.    Baseline 03/14/21: RLE 4 seconds, LLE 2 seconds 2/6: L 3 seconds R 4 seconds 3/13: L 4 seconds with pain R 15 seconds; 3/30: L 4 sec R 8 sec; 5/22: 2 sec RLE, 3 sec LLE; 6/26: LLE 11 sec, RLE 7 sec   Time 12    Period Weeks    Status MET   Target Date 08/29/21      PT LONG TERM GOAL #6   Title Patient will increase 10 meter walk test to at least 1.0 m/s with LRD as to improve gait speed for better community ambulation and to reduce fall risk.    Baseline 03/14/21: 0.67 m/s 2/6: 1.3 m/s    Time 12    Period Weeks    Status Achieved    Target Date 06/06/21     PT LONG TERM GOAL #7  Title Pt will improve bilateral DF strength to 5/5 in order to increase ease and safety with mobility and ADLs.  Baseline 6/26: 4-/5; 8/31: 4/5; 9/21: R 4-/5, L 4+/5   Time 10  Period Weeks   Status ONGOING  Target Date 02/06/2022                 Plan     Clinical Impression Statement Pt continues to tolerate  very high level balance step training, dynamic surfaces are the most challenging for patient eliciting recurrent LOB and step strategy for recovery. Extensive discussion regarding the nature of peripheral neuropathy, progressive nature of DO and that the pt should be more anticipatory regarding daily instability and continue to work hard daily to maximize other systems. Pt is very remorseful hearing this news, likens it to 'getting old' and that he is going 'down hill.' AuChief Strategy Officerried to separate the presence of this limiting pathology and pt's age, as they are unrelated. The pt will benefit from further skilled PT to improve balance and mobility and to decrease fall risk and improve QoL.    Personal Factors and Comorbidities Age;Past/Current Experience;Comorbidity 3+    Comorbidities A-fib, lumbar disc disease, prostate CA, TIA, gout, hx of pulmonary embolism    Examination-Activity Limitations Carry;Lift;Stand;Locomotion Level;Bend;Dressing;Reach Overhead;Squat;Transfers;Caring for Others;Stairs    Examination-Participation Restrictions Community Activity;Yard Work;Cleaning;Laundry;Shop;Church    Stability/Clinical Decision Making Evolving/Moderate complexity    Rehab Potential Good    PT Frequency 1x / week    PT Duration 10 weeks    PT Treatment/Interventions ADLs/Self Care Home Management;Biofeedback;Aquatic Therapy;Canalith Repostioning;Cryotherapy;Electrical Stimulation;Iontophoresis 77m53ml Dexamethasone;Moist Heat;Traction;Ultrasound;Gait training;Stair training;DME Instruction;Functional mobility training;Therapeutic activities;Therapeutic exercise;Balance training;Neuromuscular re-education;Patient/family education;Manual techniques;Passive range of motion;Dry needling;Energy conservation;Vestibular;Joint Manipulations;Orthotic Fit/Training;Splinting;Taping;Visual/perceptual remediation/compensation    PT Next Visit Plan Dynamic & static balance, transfers to/from floor, SLB progressions  continue  POC        Consulted and Agree with Plan of  Care Patient            12:11 PM, 01/16/22 [name], PT, DPT Physical Therapist - [facility]  Outpatient Physical Therapy- [place] [phone]      [place], PT 01/16/2022, 11:21 AM

## 2022-01-20 ENCOUNTER — Ambulatory Visit: Payer: Medicare Other

## 2022-01-23 ENCOUNTER — Ambulatory Visit: Payer: Medicare Other

## 2022-01-27 ENCOUNTER — Ambulatory Visit: Payer: Medicare Other

## 2022-01-27 DIAGNOSIS — R2681 Unsteadiness on feet: Secondary | ICD-10-CM

## 2022-01-27 DIAGNOSIS — M6281 Muscle weakness (generalized): Secondary | ICD-10-CM

## 2022-01-27 NOTE — Therapy (Signed)
OUTPATIENT PHYSICAL THERAPY TREATMENT NOTE/RECERT     Patient Name: Nicholas [name] MRN: [id number] DOB:17-Nov-1945, 76 y.o., male Today's Date: 01/27/2022  PCP: Rusty Aus, MD REFERRING PROVIDER: Rusty Aus, MD   PT End of Session - 01/27/22 1613     Visit Number 76    Number of Visits 63    Date for PT Re-Evaluation 02/24/22    Authorization Type Medicare Medcaid primaryd; BCBS secondary    Authorization Time Period 11/28/21-02/06/22    Progress Note Due on Visit 61    PT Start Time 1145    PT Stop Time 1231    PT Time Calculation (min) 46 min    Equipment Utilized During Treatment Gait belt    Activity Tolerance Patient tolerated treatment well;No increased pain    Behavior During Therapy [facility] for tasks assessed/performed                             Past Medical History:  Diagnosis Date   A-fib ([place])    Cancer ([place]) 10/25/2019   Prostate   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   Dysrhythmia    Pulmonary embolism (H[facility])    Skin cancer    TIA (transient ischemic attack)    Past Surgical History:  Procedure Laterality Date   APPENDECTOMY     COLONOSCOPY WITH PROPOFOL N/A 11/23/2019   Procedure: COLONOSCOPY WITH PROPOFOL;  Surgeon: Toledo, Benay Pike, MD;  Location: [facility] ENDOSCOPY;  Service: Gastroenterology;  Laterality: N/A;   cyber knife surgery     PROSTATE SURGERY     Patient Active Problem List   Diagnosis Date Noted   COVID-19 virus infection 02/17/2021   A-fib ([place])    TIA (transient ischemic attack)    Lactic acidosis    Hyponatremia    Hypotension    Generalized weakness     REFERRING DIAG: Other symptoms and signs involving the musculoskeletal system  THERAPY DIAG:  Unsteadiness on feet  Muscle weakness (generalized)  Rationale for Evaluation and Treatment Rehabilitation  PERTINENT HISTORY: Pt is a 76 y/o male, returning to PT following admission to ED for evaluation of weakness s/p fall where pt was found to be COVID  positive. Pt was in the hospital from 02/17/2021-02/23/2021. Pt was then d/c to home where he received [facility]. Pt reports increased LBP since d/c from hospital and decreased balance since last in outpatient PT. While pt using QC currently, he reports he has been using a RW at home or grabbing onto the wall to steady himself while walking. Pt reports he has been walking home distances. PMH significant for TIA, a-fib, pulmonary embolism, TIA, gout, rhabdomyolysis.   PRECAUTIONS: fall risk  SUBJECTIVE: Pt reports his knee seems to get better and worse. He thinks he may have over done it with [facility]. He feels his knee has been better lately. He reports the R one swelled up recently a little bit more. Went to see his physician about his neuropathy to come up with plan. Reports he was instructed to practice SLB.  He has been practicing this more. He has a follow-up in a few weeks to determine progress.  PAIN:  Are you having pain? No   TODAY'S TREATMENT:  Gait belt donned and CGA provided throughout unless specified otherwise. Please refer to goal section below for details of retesting.   SLB 3x30 sec each LE. Sustained 8 seconds on RLE. Intermittent LE support. Unable to  sustain LLE SLB.  - Repeated this sequence on airex. Continues intermittent UE support.  On airex: Basket ball- performed in NBOS and semi-tandem (each LE) with pt making shots with R then LUE for coordination and increased balance challenge x several minutes. Intermittent UE support and min assist required throughout.   TherEx- MMT LE grossly 5/5, exception is DF 3 to 3+/5 B and PF 4/5 B  Seated DF 20x B  Standing heel raise 2x20 B  STS 2x13    Provided and reviewed updated HEP to address primary remaining deficits. Access Code: [email or web address] URL: [email or web address] Date: 01/27/2022 Prepared by: [place] with Counter Support  - 1 x daily - 5 x weekly - 2 sets -  20 reps - 2 seconds hold - Seated Toe Raise  - 1 x daily - 5 x weekly - 2 sets - 20 reps - 2 seconds hold - Single Leg Stance with Support  - 1 x daily - 7 x weekly - 3 sets - 1 reps - 60 seconds hold - Sit to Stand Without Arm Support  - 1 x daily - 5 x weekly - 2 sets - 12 reps prev   PATIENT EDUCATION:  Education details: exercise technique, body mechanics, plan for d/c in a few visits Person educated: Patient Education method: Explanation, Demonstration, and Verbal cues Education comprehension: verbalized understanding, returned demonstration, verbal cues required, and needs further education   HOME EXERCISE PROGRAM:   Updated for pt to focus on the following 11/20: Access Code: [email or web address] URL: [email or web address] Date: 01/27/2022 Prepared by: [place] with Counter Support  - 1 x daily - 5 x weekly - 2 sets - 20 reps - 2 seconds hold - Seated Toe Raise  - 1 x daily - 5 x weekly - 2 sets - 20 reps - 2 seconds hold - Single Leg Stance with Support  - 1 x daily - 7 x weekly - 3 sets - 1 reps - 60 seconds hold - Sit to Stand Without Arm Support  - 1 x daily - 5 x weekly - 2 sets - 12 reps prev  9/7: Access Code: [email or web address] URL: [email or web address] Date: 11/14/2021 Prepared by: Ricard Dillon  Exercises - Seated Toe Raise  - 1 x daily - 5 x weekly - 2 sets - 25 reps - 1 second hold  8/31: instruction in slow marching in front of counter for support.  09/12/21: [employer] HEP - Seated Row 2 sets of 15 reps at a weight that feels like a moderate challenge 2. Seated hamstring curls 2 sets of 15 reps at a weight that feels like a moderate challenge 3. Seated leg press 2 sets of 15 reps at a weight that feels like a moderate challenge 4. Seated leg press heel raises 2 sets of 15 reps at a weight that feels like a moderate challenge Pictures of interventions also provided to pt in handout.  1/9: Access Code: [email or web address]; 4/27: Access  Code: [email or web address]   PT Short Term Goals - TARGET DATE 02/17/2022        PT SHORT TERM GOAL #1   Title Patient will be independent in home exercise program to improve balance, and strength/mobility for better functional independence with ADLs and decreased fall risk.    Baseline 03/14/21: to be initiated within next 1-2 sessions 2/6: HEP compliance    Time 6    Period Weeks    Status Achieved    Target  Date 06/06/21      PT SHORT TERM GOAL #2   Title Pt will improve gross BLE strength to 5/5 in order to increase ease and safety with mobility and ADLs.    Baseline 03/14/21: strength 4+/5 B, greatest deficit in B ankle DFs 4-/5 B 2/6: grossly 4+/5 with df 4/5 3/13: see note; 5/22: grossly 4+/5 bilat, weakness still most prominent in PF, 6/26: grossly 5/5, weakness prominent in DF (4-/5); 8/31: grossly 5/5 all mm except DFs 4/5; 9/21:DF R 4-/5, L 4+/5 ; 11/20  MMT LE grossly 5/5, exception is DF 3 to 3+/5 B and PF 4/5 B   Time 10   Period Weeks    Status Partially Met    Target Date 02/06/2022             PT Long Term Goals       PT LONG TERM GOAL #1   Title Patient will increase FOTO score to greater than 59 to demonstrate statistically significant improvement in mobility and quality of life.    Baseline 03/14/21: 58 2/6: 57% 3/13: 57.4%; 3/30: 62    Time 12    Period Weeks    Status Achieved    Target Date 06/06/21      PT LONG TERM GOAL #2   Title Patient (> 44 years old) will complete five times sit to stand test in < 10 seconds indicating increased LE strength and decreased fall risk    Baseline 03/14/21: 12 seconds 2/6: 12 seconds 3/13: 13.8 seconds without hands; 3/30: 13 sec; 4/1; 5/22: 12 sec hands free, 6/26: 12 sec hands free; 8/31: 11 seconds hands-free, some decreased eccentric control; 9/21: 11 sec hands- free; 11/20 deferred to next visit   Time 10   Period Weeks    Status Partially Met    Target Date 02/06/2022      PT LONG TERM GOAL #3   Title Pt will improve DGI  by at least 3 points in order to demonstrate clinically significant improvement in balance and decreased risk for falls.    Baseline 03/14/21: 15/24 2/6: 17/24 3/13: 19/24; 3/30: 21/24    Time 12    Period Weeks    Status Achieved    Target Date 06/06/21      PT LONG TERM GOAL #4   Title Pt will increase 6MWT by at least 71m(1654f in order to demonstrate clinically significant improvement in cardiopulmonary endurance and community ambulation    Baseline 03/14/21: 795 ft with QC 2/6: 865 ft without AD 3/13: 770 ft with walking stick with knee pain; 3/30: 1056 ft    Time 12    Period Weeks    Status Achieved    Target Date 08/29/21      PT LONG TERM GOAL #5   Title The patient will be able to maintain >5 seconds of SLB on BLEs in order to decrease risk of falls when navigating obstacles, curbs and steps.    Baseline 03/14/21: RLE 4 seconds, LLE 2 seconds 2/6: L 3 seconds R 4 seconds 3/13: L 4 seconds with pain R 15 seconds; 3/30: L 4 sec R 8 sec; 5/22: 2 sec RLE, 3 sec LLE; 6/26: LLE 11 sec, RLE 7 sec   Time 12    Period Weeks    Status MET   Target Date 08/29/21      PT LONG TERM GOAL #6   Title Patient will increase 10 meter walk test to at least 1.0 m/s with  LRD as to improve gait speed for better community ambulation and to reduce fall risk.    Baseline 03/14/21: 0.67 m/s 2/6: 1.3 m/s    Time 12    Period Weeks    Status Achieved    Target Date 06/06/21     PT LONG TERM GOAL #7  Title Pt will improve bilateral DF strength to 5/5 in order to increase ease and safety with mobility and ADLs.  Baseline 6/26: 4-/5; 8/31: 4/5; 9/21: R 4-/5, L 4+/5 ; 11/20:  DF 3 to 3+/5 B   Time 10  Period Weeks   Status ONGOING  Target Date 02/17/2022                     Plan     Clinical Impression Statement MMT and specifically DF strength reassessed for recert. Pt's LE strength is generally 5/5 with exception of DF/PF. Where pt has seen decrease in DF strength compared to previous  assessment. PT provided updated HEP to pt to focus on remaining deficits prior to next appointment, which will be patient's last PT appointment (pt agreeable to plan). The pt will benefit from one more skilled PT appointment to complete final assessment and determine independence with updated HEP in order to improve strength and balance and to decrease fall risk.   Personal Factors and Comorbidities Age;Past/Current Experience;Comorbidity 3+    Comorbidities A-fib, lumbar disc disease, prostate CA, TIA, gout, hx of pulmonary embolism    Examination-Activity Limitations Carry;Lift;Stand;Locomotion Level;Bend;Dressing;Reach Overhead;Squat;Transfers;Caring for Others;Stairs    Examination-Participation Restrictions Community Activity;Yard Work;Cleaning;Laundry;Shop;Church    Stability/Clinical Decision Making Evolving/Moderate complexity    Rehab Potential Good    PT Frequency 1x / week    PT Duration 10 weeks    PT Treatment/Interventions ADLs/Self Care Home Management;Biofeedback;Aquatic Therapy;Canalith Repostioning;Cryotherapy;Electrical Stimulation;Iontophoresis 47m/ml Dexamethasone;Moist Heat;Traction;Ultrasound;Gait training;Stair training;DME Instruction;Functional mobility training;Therapeutic activities;Therapeutic exercise;Balance training;Neuromuscular re-education;Patient/family education;Manual techniques;Passive range of motion;Dry needling;Energy conservation;Vestibular;Joint Manipulations;Orthotic Fit/Training;Splinting;Taping;Visual/perceptual remediation/compensation    PT Next Visit Plan Dynamic & static balance, transfers to/from floor, SLB progressions  continue POC        Consulted and Agree with Plan of Care Patient            4:23 PM, 01/27/22 HRicard DillonPT, DPT  Physical [facility]rap[facility] PT 01/27/2022, 4:23 PM

## 2022-02-03 ENCOUNTER — Ambulatory Visit: Payer: Medicare Other

## 2022-02-06 ENCOUNTER — Ambulatory Visit: Payer: Medicare Other

## 2022-02-10 ENCOUNTER — Ambulatory Visit: Payer: Medicare Other

## 2022-02-11 ENCOUNTER — Ambulatory Visit: Payer: Medicare Other | Attending: Internal Medicine

## 2022-02-11 DIAGNOSIS — R2681 Unsteadiness on feet: Secondary | ICD-10-CM | POA: Diagnosis present

## 2022-02-11 DIAGNOSIS — R262 Difficulty in walking, not elsewhere classified: Secondary | ICD-10-CM | POA: Insufficient documentation

## 2022-02-11 DIAGNOSIS — M6281 Muscle weakness (generalized): Secondary | ICD-10-CM | POA: Insufficient documentation

## 2022-02-11 NOTE — Therapy (Signed)
OUTPATIENT PHYSICAL THERAPY TREATMENT NOTE/DISCHARGE     Patient Name: Nicholas [name] MRN: [id number] DOB:[birth year], 76 y.o., male Today's Date: 02/11/2022  PCP: Rusty Aus, MD REFERRING PROVIDER: Rusty Aus, MD   PT End of Session - 02/11/22 1308     Visit Number 70    Number of Visits 27    Date for PT Re-Evaluation 02/24/22    Authorization Type Medicare [employer] primaryd; BCBS secondary    Authorization Time Period 11/28/21-02/06/22    Progress Note Due on Visit 67    PT Start Time 1152    PT Stop Time 1231    PT Time Calculation (min) 39 min    Equipment Utilized During Treatment Gait belt    Activity Tolerance Patient tolerated treatment well;Patient limited by pain    Behavior During Therapy WFL for tasks assessed/performed                              Past Medical History:  Diagnosis Date   A-fib ([place])    Cancer ([place]) 10/25/2019   Prostate   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   Dysrhythmia    Pulmonary embolism ([place])    Skin cancer    TIA (transient ischemic attack)    Past Surgical History:  Procedure Laterality Date   APPENDECTOMY     COLONOSCOPY WITH PROPOFOL N/A 11/23/2019   Procedure: COLONOSCOPY WITH PROPOFOL;  Surgeon: [place], Benay Pike, MD;  Location: [facility] ENDOSCOPY;  Service: Gastroenterology;  Laterality: N/A;   cyber knife surgery     PROSTATE SURGERY     Patient Active Problem List   Diagnosis Date Noted   COVID-19 virus infection 02/17/2021   A-fib ([place])    TIA (transient ischemic attack)    Lactic acidosis    Hyponatremia    Hypotension    Generalized weakness     REFERRING DIAG: Other symptoms and signs involving the musculoskeletal system  THERAPY DIAG:  Difficulty in walking, not elsewhere classified  Unsteadiness on feet  Muscle weakness (generalized)  Rationale for Evaluation and Treatment Rehabilitation  PERTINENT HISTORY: Pt is a 76 y/o male, returning to PT following admission to ED for  evaluation of weakness s/p fall where pt was found to be COVID positive. Pt was in the hospital from 02/17/2021-02/23/2021. Pt was then d/c to home where he received [facility]. Pt reports increased LBP since d/c from hospital and decreased balance since last in outpatient PT. While pt using QC currently, he reports he has been using a RW at home or grabbing onto the wall to steady himself while walking. Pt reports he has been walking home distances. PMH significant for TIA, a-fib, pulmonary embolism, TIA, gout, rhabdomyolysis.   PRECAUTIONS: fall risk  SUBJECTIVE: Pt reports he worked out the other day and may have done more than he realized, is now feeling a little weaker today. He reports his hips tend to hurt initially with walking, but this can improve with some exercises. However, he does feel this is worse than it used to be. No pain reported currently.  PAIN:  Are you having pain? No   TODAY'S TREATMENT:  Therapeutic Activity- Goal testing reviewed for discharge session. Please refer to goal section for details. PT instructed pt in testing with goals throughout and indications of goal performance. PT additionally provided discharge recommendations.    PATIENT EDUCATION:  Education details: goal retesting, indications, d/c recommendations, encouraged pt to continue at [facility] and  with HEP Person educated: Patient Education method: Explanation, Demonstration, and Verbal cues Education comprehension: verbalized understanding and returned demonstration   HOME EXERCISE PROGRAM: Pt to continue HEP as previously given  Updated for pt to focus on the following 11/20: Access Code: [email or web address] URL: [email or web address] Date: 01/27/2022 Prepared by: [place] with Counter Support  - 1 x daily - 5 x weekly - 2 sets - 20 reps - 2 seconds hold - Seated Toe Raise  - 1 x daily - 5 x weekly - 2 sets - 20 reps - 2 seconds hold - Single Leg Stance with  Support  - 1 x daily - 7 x weekly - 3 sets - 1 reps - 60 seconds hold - Sit to Stand Without Arm Support  - 1 x daily - 5 x weekly - 2 sets - 12 reps prev  9/7: Access Code: [email or web address] URL: [email or web address] Date: 11/14/2021 Prepared by: Ricard Dillon  Exercises - Seated Toe Raise  - 1 x daily - 5 x weekly - 2 sets - 25 reps - 1 second hold  8/31: instruction in slow marching in front of counter for support.  09/12/21: [facility] HEP - Seated Row 2 sets of 15 reps at a weight that feels like a moderate challenge 2. Seated hamstring curls 2 sets of 15 reps at a weight that feels like a moderate challenge 3. Seated leg press 2 sets of 15 reps at a weight that feels like a moderate challenge 4. Seated leg press heel raises 2 sets of 15 reps at a weight that feels like a moderate challenge Pictures of interventions also provided to pt in handout.  1/9: Access Code: [email or web address]; 4/27: Access Code: [email or web address]   PT Short Term Goals - TARGET DATE 02/17/2022        PT SHORT TERM GOAL #1   Title Patient will be independent in home exercise program to improve balance, and strength/mobility for better functional independence with ADLs and decreased fall risk.    Baseline 03/14/21: to be initiated within next 1-2 sessions 2/6: HEP compliance; 12/5: reports doing HEP, still going to the [facility]   Time 6    Period Weeks    Status Achieved    Target Date 06/06/21      PT SHORT TERM GOAL #2   Title Pt will improve gross BLE strength to 5/5 in order to increase ease and safety with mobility and ADLs.    Baseline 03/14/21: strength 4+/5 B, greatest deficit in B ankle DFs 4-/5 B 2/6: grossly 4+/5 with df 4/5 3/13: see note; 5/22: grossly 4+/5 bilat, weakness still most prominent in PF, 6/26: grossly 5/5, weakness prominent in DF (4-/5); 8/31: grossly 5/5 all mm except DFs 4/5; 9/21:DF R 4-/5, L 4+/5 ; 11/20  MMT LE grossly 5/5, exception is DF 3 to 3+/5 B and PF 4/5 B; 12/5: grossly 4+/5 BLE  exception DF 4-/5   Time 10   Period Weeks    Status Partially Met    Target Date 02/06/2022             PT Long Term Goals       PT LONG TERM GOAL #1   Title Patient will increase FOTO score to greater than 59 to demonstrate statistically significant improvement in mobility and quality of life.    Baseline 03/14/21: 58 2/6: 57% 3/13: 57.4%; 3/30: 62 ; 12/5 57 (goal previously met)   Time 12    Period Weeks  Status Previously achieved, now partially met   Target Date 06/06/21      PT LONG TERM GOAL #2   Title Patient (> 40 years old) will complete five times sit to stand test in < 10 seconds indicating increased LE strength and decreased fall risk    Baseline 03/14/21: 12 seconds 2/6: 12 seconds 3/13: 13.8 seconds without hands; 3/30: 13 sec; 4/1; 5/22: 12 sec hands free, 6/26: 12 sec hands free; 8/31: 11 seconds hands-free, some decreased eccentric control; 9/21: 11 sec hands- free; 11/20 deferred to next visit ; 12/5: 11 sec hands-free   Time 10   Period Weeks    Status Partially Met    Target Date 02/06/2022      PT LONG TERM GOAL #3   Title Pt will improve DGI by at least 3 points in order to demonstrate clinically significant improvement in balance and decreased risk for falls.    Baseline 03/14/21: 15/24 2/6: 17/24 3/13: 19/24; 3/30: 21/24 ; 12/5: 22/24   Time 12    Period Weeks    Status Achieved    Target Date 06/06/21      PT LONG TERM GOAL #4   Title Pt will increase 6MWT by at least 14m(1613f in order to demonstrate clinically significant improvement in cardiopulmonary endurance and community ambulation    Baseline 03/14/21: 795 ft with QC 2/6: 865 ft without AD 3/13: 770 ft with walking stick with knee pain; 3/30: 1056 ft; 12/5: 850 ft, somewhat limited by B hip ache, antalgic gait    Time 12    Period Weeks    Status Previously achieved, now partially met   Target Date 08/29/21      PT LONG TERM GOAL #5   Title The patient will be able to maintain >5 seconds  of SLB on BLEs in order to decrease risk of falls when navigating obstacles, curbs and steps.    Baseline 03/14/21: RLE 4 seconds, LLE 2 seconds 2/6: L 3 seconds R 4 seconds 3/13: L 4 seconds with pain R 15 seconds; 3/30: L 4 sec R 8 sec; 5/22: 2 sec RLE, 3 sec LLE; 6/26: LLE 11 sec, RLE 7 sec; 12/5: 3 sec L, 2 sec R   Time 12    Period Weeks    Status MET (previously met, now partially achieved)   Target Date 08/29/21      PT LONG TERM GOAL #6   Title Patient will increase 10 meter walk test to at least 1.0 m/s with LRD as to improve gait speed for better community ambulation and to reduce fall risk.    Baseline 03/14/21: 0.67 m/s 2/6: 1.3 m/s ; 12/5: 1 m/s   Time 12    Period Weeks    Status Achieved    Target Date 06/06/21     PT LONG TERM GOAL #7  Title Pt will improve bilateral DF strength to 5/5 in order to increase ease and safety with mobility and ADLs.  Baseline 6/26: 4-/5; 8/31: 4/5; 9/21: R 4-/5, L 4+/5 ; 11/20:  DF 3 to 3+/5 B 12/5: 4-/5  Time 10  Period Weeks   Status NOT MET  Target Date 02/17/2022                     Plan     Clinical Impression Statement Goal retesting completed for discharge session. Pt's general goal performance does indicate a plateau in progress, with some decreases seen with SLB, FOTO, and  particularly with 6MWT. 6MWT today limited by B hip discomfort/pain. Pt DF strength did improve as well as DGI score. PT provided discharge recommendations to pt such as continuing going to [facility] and performing HEP beyond therapy to maintain gains. PT also recommends that pt follow-up with his physician should B hip pain continue to be a problem, and that he might benefit from orthopedic PT in the future to address this. The pt is to be discharged on this date and is agreeable to plan.   Personal Factors and Comorbidities Age;Past/Current Experience;Comorbidity 3+    Comorbidities A-fib, lumbar disc disease, prostate CA, TIA, gout, hx of pulmonary  embolism    Examination-Activity Limitations Carry;Lift;Stand;Locomotion Level;Bend;Dressing;Reach Overhead;Squat;Transfers;Caring for Others;Stairs    Examination-Participation Restrictions Community Activity;Yard Work;Cleaning;Laundry;Shop;Church    Stability/Clinical Decision Making Evolving/Moderate complexity    Rehab Potential Good    PT Frequency 1x / week    PT Duration 10 weeks    PT Treatment/Interventions ADLs/Self Care Home Management;Biofeedback;Aquatic Therapy;Canalith Repostioning;Cryotherapy;Electrical Stimulation;Iontophoresis 58m/ml Dexamethasone;Moist Heat;Traction;Ultrasound;Gait training;Stair training;DME Instruction;Functional mobility training;Therapeutic activities;Therapeutic exercise;Balance training;Neuromuscular re-education;Patient/family education;Manual techniques;Passive range of motion;Dry needling;Energy conservation;Vestibular;Joint Manipulations;Orthotic Fit/Training;Splinting;Taping;Visual/perceptual remediation/compensation    PT Next Visit Plan Dynamic & static balance, transfers to/from floor, SLB progressions  continue POC        Consulted and Agree with Plan of Care Patient            1:20 PM, 02/11/22 HRicard DillonPT, DPT  Physical Therapist - [email or web address] PT 02/11/2022, 1:20 PM

## 2022-02-13 ENCOUNTER — Ambulatory Visit: Payer: Medicare Other

## 2022-02-17 ENCOUNTER — Ambulatory Visit: Payer: Medicare Other

## 2022-02-20 ENCOUNTER — Ambulatory Visit: Payer: Medicare Other

## 2022-02-24 ENCOUNTER — Ambulatory Visit: Payer: Medicare Other

## 2022-02-27 ENCOUNTER — Ambulatory Visit: Payer: Medicare Other

## 2022-03-05 ENCOUNTER — Other Ambulatory Visit: Payer: Self-pay | Admitting: Dermatology

## 2022-03-05 DIAGNOSIS — L219 Seborrheic dermatitis, unspecified: Secondary | ICD-10-CM

## 2022-03-06 ENCOUNTER — Ambulatory Visit: Payer: Medicare Other

## 2022-03-11 ENCOUNTER — Other Ambulatory Visit: Payer: Self-pay | Admitting: *Deleted

## 2022-03-11 DIAGNOSIS — Z8546 Personal history of malignant neoplasm of prostate: Secondary | ICD-10-CM

## 2022-03-12 ENCOUNTER — Ambulatory Visit: Payer: Medicare PPO

## 2022-03-13 ENCOUNTER — Ambulatory Visit: Payer: Medicare Other

## 2022-03-17 ENCOUNTER — Ambulatory Visit: Payer: Medicare PPO | Attending: Orthopedic Surgery

## 2022-03-17 DIAGNOSIS — M25619 Stiffness of unspecified shoulder, not elsewhere classified: Secondary | ICD-10-CM | POA: Insufficient documentation

## 2022-03-17 DIAGNOSIS — M25512 Pain in left shoulder: Secondary | ICD-10-CM | POA: Diagnosis present

## 2022-03-17 DIAGNOSIS — M545 Low back pain, unspecified: Secondary | ICD-10-CM | POA: Diagnosis present

## 2022-03-17 DIAGNOSIS — M6281 Muscle weakness (generalized): Secondary | ICD-10-CM | POA: Diagnosis present

## 2022-03-17 DIAGNOSIS — R2681 Unsteadiness on feet: Secondary | ICD-10-CM | POA: Insufficient documentation

## 2022-03-17 DIAGNOSIS — G8929 Other chronic pain: Secondary | ICD-10-CM | POA: Insufficient documentation

## 2022-03-17 DIAGNOSIS — M25562 Pain in left knee: Secondary | ICD-10-CM | POA: Diagnosis present

## 2022-03-17 NOTE — Therapy (Signed)
OUTPATIENT PHYSICAL THERAPY SHOULDER EVALUATION   Patient Name: Nicholas [name] MRN: [id number] DOB:February 08, 1946, 77 y.o., male Today's Date: 03/18/2022  END OF SESSION:  PT End of Session - 03/17/22 1109     Visit Number 1    Number of Visits 24    Date for PT Re-Evaluation 06/09/22    Progress Note Due on Visit 10    PT Start Time 1100    PT Stop Time 1153    PT Time Calculation (min) 53 min    Activity Tolerance Patient tolerated treatment well;Patient limited by pain    Behavior During Therapy [facility] for tasks assessed/performed             Past Medical History:  Diagnosis Date   A-fib (Nicholas [name])    Cancer ([place]) 10/25/2019   Prostate   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   Dysrhythmia    Pulmonary embolism ([place])    Skin cancer    TIA (transient ischemic attack)    Past Surgical History:  Procedure Laterality Date   APPENDECTOMY     COLONOSCOPY WITH PROPOFOL N/A 11/23/2019   Procedure: COLONOSCOPY WITH PROPOFOL;  Surgeon: [name], Nicholas Pike, MD;  Location: [facility] ENDOSCOPY;  Service: Gastroenterology;  Laterality: N/A;   cyber knife surgery     PROSTATE SURGERY     Patient Active Problem List   Diagnosis Date Noted   COVID-19 virus infection 02/17/2021   A-fib ([place])    TIA (transient ischemic attack)    Lactic acidosis    Hyponatremia    Hypotension    Generalized weakness     PCP: Nicholas [name]  REFERRING PROVIDER: Dr. Hessie [name]  REFERRING DIAG: OA L Shoulder   THERAPY DIAG:  Muscle weakness (generalized)  Limited range of motion (ROM) of shoulder  Chronic left shoulder pain  Rationale for Evaluation and Treatment: Rehabilitation  ONSET DATE: 01/08/2022  SUBJECTIVE:                                                                                                                                                                                      SUBJECTIVE STATEMENT: Patient reports he is back to PT this time for  progressive left shoulder pain. He  reports difficulty with overhead reaching, cross body movement, and lifting with left arm. States he discussed with MD and xray revealed some arthritis.    PERTINENT HISTORY: Patient is a 77 year old male with chronic history of left shoulder pain since stra[place] his [place] arm [place] amusement [place] several years ago but progressively worse over past 2-3 months. He report intermittent left shoulder pain with active movement and stiffness some at rest. Patient was  most recently discharged last month from outpatient PT for gait/balance issues.   PAIN:  Are you having pain? Yes: NPRS scale: 8/10 Pain location: Left  shoulder  Pain description: intermittent ache sometimes at rest/sharp with movement Aggravating factors: overhead reaching; reaching cross body Relieving factors: rest- have not really tried heat/ice and I havent taken any pain meds  PRECAUTIONS: Fall  WEIGHT BEARING RESTRICTIONS: No  FALLS:  Has patient fallen in last 6 months? No  LIVING ENVIRONMENT: Lives with: lives with their spouse Lives in: House/apartment Stairs: Yes: External: 4 steps; can reach both Has following equipment at home: Single point cane and Walker - 2 wheeled  OCCUPATION: Retired   PLOF: Independent  PATIENT GOALS: learn what to do to get over this pain in my shoulder   NEXT MD VISIT: Dr. Rudene [name]- sometime in March per patient report  OBJECTIVE:   DIAGNOSTIC FINDINGS:  02/05/2022 12:26 PM EST  AP and scapular Y x-rays of the left shoulder were ordered and personally reviewed today. These show glenohumeral degenerative changes at the inferior glenoid and inferior humeral head. Scapular Y view shows normal subacromial space and no deformity. Minimal AC arthritis.  X-ray Impression Moderate glenohumeral osteoarthritis, left shoulder.     Imaging Results - X-ray shoulder complete left minimum 2 views (02/05/2022 8:47 AM EST) Authorizing Provider Result Type  Nicholas Poag MD IMG DIAGNOSTIC  IMAGING ORDERABLES     PATIENT SURVEYS:  Quick Dash 20.5 and FOTO 55  COGNITION: Overall cognitive status: Within functional limits for tasks assessed     SENSATION: WFL  POSTURE: Forward head Protracted shoulders bilaterally Very stiff cervical region and left shoulder  UPPER EXTREMITY ROM:   Active ROM Right eval Left eval  Shoulder flexion 160 *135  Shoulder extension  *62  Shoulder abduction 162 *130  Shoulder adduction    Shoulder internal rotation  *58  Shoulder external rotation  *49  Elbow flexion  WNL  Elbow extension  WNL  Wrist flexion    Wrist extension    Wrist ulnar deviation    Wrist radial deviation    Wrist pronation    Wrist supination    (Blank rows = not tested) *= pain and empty end feel today  UPPER EXTREMITY MMT:  MMT Right eval Left eval  Shoulder flexion 5 4/5 within available ROM  Shoulder extension 5 4  Shoulder abduction 5 4  Shoulder adduction 5 4  Shoulder internal rotation 5 4  Shoulder external rotation 5 4  Middle trapezius    Lower trapezius    Elbow flexion 5 4  Elbow extension 5 4  Wrist flexion    Wrist extension    Wrist ulnar deviation    Wrist radial deviation    Wrist pronation    Wrist supination    Grip strength (lbs)    (Blank rows = not tested)  SHOULDER SPECIAL TESTS: Impingement tests: Neer impingement test: positive , Hawkins/Kennedy impingement test: positive , and Painful arc test: negative Instability tests: Sulcus sign: negative Rotator cuff assessment: Drop arm test: negative and Full can test: negative Biceps assessment: Yergason's test: negative and Speed's test: positive   JOINT MOBILITY TESTING:  Hypomobile GH joint with inf/PA/AP mobilty  PALPATION:  (+) tenderness superior shoulder with some anterior pain with end range motion   TODAY'S TREATMENT:  DATE: 03/17/2022- PT evaluation of Left shoulder and implementation of HEP- Issued handout to patient   PATIENT EDUCATION: Education details: Purpose of PT for rehab of Left shoulder pain, limited ROM, and muscle weakness. Intro HEP Person educated: Patient Education method: Explanation, Demonstration, Tactile cues, Verbal cues, and Handouts Education comprehension: verbalized understanding, returned demonstration, verbal cues required, tactile cues required, and needs further education  HOME EXERCISE PROGRAM: Access Code: [email or web address] URL: [email or web address] Date: 03/17/2022 Prepared by: Sande Brothers  Exercises - Supine Shoulder Flexion Extension AAROM with Dowel  - 1 x daily - 7 x weekly - 3 sets - 10 reps - Supine Shoulder Abduction AAROM with Dowel  - 1 x daily - 7 x weekly - 3 sets - 10 reps - Supine Shoulder External Rotation in 45 Degrees Abduction AAROM with Dowel  - 1 x daily - 7 x weekly - 3 sets - 10 reps - Standing Shoulder Internal Rotation Stretch with Towel  - 1 x daily - 7 x weekly - 3 sets - 10 reps - Standing Bilateral Shoulder Internal Rotation AAROM with Dowel  - 1 x daily - 7 x weekly - 3 sets - 10 reps  ASSESSMENT:  CLINICAL IMPRESSION: Patient is a 77 y.o. male who was seen today for physical therapy evaluation and treatment for Left shoulder osteoarthritis. He presents with limited shoulder ROM due to pain with empty end feel, hypomobile shoulder with left UE weakness and impaired functional use. He will benefit from skilled PT services to assist in restoring mobility, functional strength and overhead use with decreased pain for optimal functional independence in his home and in the community.   OBJECTIVE IMPAIRMENTS: decreased ROM, decreased strength, hypomobility, increased fascial restrictions, impaired flexibility, impaired UE functional use, and pain.   ACTIVITY LIMITATIONS: carrying, lifting, sleeping, dressing, and reach over  head  PARTICIPATION LIMITATIONS: cleaning, laundry, community activity, and yard work  PERSONAL FACTORS:  none  are also affecting patient's functional outcome.   REHAB POTENTIAL: Good  CLINICAL DECISION MAKING: Stable/uncomplicated  EVALUATION COMPLEXITY: Low   GOALS: Goals reviewed with patient? Yes  SHORT TERM GOALS: Target date: 04/28/2022  Pt will be independent with HEP in order to improve strength and decrease pain in order to improve pain-free function at home and work. Baseline: Eval Goal status: INITIAL  2.   Pt will decrease worst pain in left shoulder as reported on NPRS by at least 1 points in order to demonstrate reduction in pain. Baseline: EVAL= 8/10 Goal status: INITIAL   LONG TERM GOALS: Target date: 06/09/2022  Pt will decrease quick DASH score by at least 8% in order to demonstrate clinically significant reduction in disability. Baseline: EVAL=20.5 Goal status: INITIAL  2.  Pt will improve FOTO to target score of 67 to display perceived improvements in ability to complete ADL's.  Baseline: EVAL: 55 Goal status: INITIAL  3.  Pt will decrease worst pain in left shoulder as reported on NPRS by at least 3 points in order to demonstrate clinically significant reduction in pain. Baseline: EVAL Goal status: INITIAL  4.  Pt will increase strength of  by at least 1/2 MMT grade in order to demonstrate improvement in strength and function  Baseline: EVAL= 4/5 in available range with shoulder flex/abd/ER/IR. Goal status: INITIAL    PLAN:  PT FREQUENCY: 1-2x/week  PT DURATION: 12 weeks  PLANNED INTERVENTIONS: Therapeutic exercises, Therapeutic activity, Neuromuscular re-education, Patient/Family education, Self Care, Joint mobilization, Dry Needling, Electrical stimulation, Spinal manipulation, Spinal mobilization, Cryotherapy, Moist heat,  Taping, Ultrasound, and Manual therapy  PLAN FOR NEXT SESSION: Review and progress painfree shoulder ROM; perform manual  therapy as appropriate; progress HEP, and begin gentle therex as appropriate.   Lewis Moccasin, PT 03/18/2022, 6:33 AM

## 2022-03-20 ENCOUNTER — Ambulatory Visit: Payer: Medicare Other

## 2022-03-27 ENCOUNTER — Ambulatory Visit: Payer: Medicare PPO

## 2022-03-27 ENCOUNTER — Ambulatory Visit: Payer: Medicare Other

## 2022-03-27 ENCOUNTER — Other Ambulatory Visit: Payer: Medicare PPO

## 2022-03-27 DIAGNOSIS — M6281 Muscle weakness (generalized): Secondary | ICD-10-CM | POA: Diagnosis not present

## 2022-03-27 DIAGNOSIS — G8929 Other chronic pain: Secondary | ICD-10-CM

## 2022-03-27 DIAGNOSIS — Z8546 Personal history of malignant neoplasm of prostate: Secondary | ICD-10-CM

## 2022-03-27 DIAGNOSIS — M25619 Stiffness of unspecified shoulder, not elsewhere classified: Secondary | ICD-10-CM

## 2022-03-27 NOTE — Therapy (Signed)
OUTPATIENT PHYSICAL THERAPY SHOULDER TREATMENT NOTE   Patient Name: Nicholas [name] MRN: [id number] DOB:04-Oct-1945, 77 y.o., male Today's Date: 03/27/2022  END OF SESSION:  PT End of Session - 03/27/22 1056     Visit Number 2    Number of Visits 24    Date for PT Re-Evaluation 06/09/22    Progress Note Due on Visit 10    PT Start Time 1101    PT Stop Time 1145    PT Time Calculation (min) 44 min    Activity Tolerance Patient tolerated treatment well;Patient limited by pain    Behavior During Therapy [facility] for tasks assessed/performed              Past Medical History:  Diagnosis Date   A-fib ([place])    Cancer ([place]) 10/25/2019   Prostate   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   Dysrhythmia    Pulmonary embolism ([place])    Skin cancer    TIA (transient ischemic attack)    Past Surgical History:  Procedure Laterality Date   APPENDECTOMY     COLONOSCOPY WITH PROPOFOL N/A 11/23/2019   Procedure: COLONOSCOPY WITH PROPOFOL;  Surgeon: Toledo, Benay Pike, MD;  Location: [facility] ENDOSCOPY;  Service: Gastroenterology;  Laterality: N/A;   cyber knife surgery     PROSTATE SURGERY     Patient Active Problem List   Diagnosis Date Noted   COVID-19 virus infection 02/17/2021   A-fib ([place])    TIA (transient ischemic attack)    Lactic acidosis    Hyponatremia    Hypotension    Generalized weakness     PCP: Dr. Emily Filbert  REFERRING PROVIDER: Dr. Hessie Knows  REFERRING DIAG: OA L Shoulder   THERAPY DIAG:  Chronic left shoulder pain  Muscle weakness (generalized)  Limited range of motion (ROM) of shoulder  Rationale for Evaluation and Treatment: Rehabilitation  ONSET DATE: 01/08/2022  SUBJECTIVE:                                                                                                                                                                                      SUBJECTIVE STATEMENT: Pt reports his shoulder pain has been "off and on." He reports he is unable  to reach behind his back with his LUE. He describes his shoulder pain as an ache. He reports he has not been doing his HEP consistently   PERTINENT HISTORY: Patient is a 77 year old male with chronic history of left shoulder pain since [place] several years [place] but progressively worse over past 2-3 months. He report intermittent left shoulder pain with active movement and stiffness some at  rest. Patient was most recently discharged last month from outpatient PT for gait/balance issues.   PAIN: taken at eval Are you having pain? Yes: NPRS scale: 8/10 Pain location: Left  shoulder  Pain description: intermittent ache sometimes at rest/sharp with movement Aggravating factors: overhead reaching; reaching cross body Relieving factors: rest- have not really tried heat/ice and I havent taken any pain meds  PRECAUTIONS: Fall  WEIGHT BEARING RESTRICTIONS: No  FALLS:  Has patient fallen in last 6 months? No  LIVING ENVIRONMENT: Lives with: lives with their spouse Lives in: House/apartment Stairs: Yes: External: 4 steps; can reach both Has following equipment at home: Single point cane and Walker - 2 wheeled  OCCUPATION: Retired   PLOF: Independent  PATIENT GOALS: learn what to do to get over this pain in my shoulder   NEXT MD VISIT: Dr. Rudene Christians- sometime in March per patient report  OBJECTIVE:   DIAGNOSTIC FINDINGS:  02/05/2022 12:26 PM EST  AP and scapular Y x-rays of the left shoulder were ordered and personally reviewed today. These show glenohumeral degenerative changes at the inferior glenoid and inferior humeral head. Scapular Y view shows normal subacromial space and no deformity. Minimal AC arthritis.  X-ray Impression Moderate glenohumeral osteoarthritis, left shoulder.     Imaging Results - X-ray shoulder complete left minimum 2 views (02/05/2022 8:47 AM EST) Authorizing Provider Result Type  Lauris Poag MD IMG DIAGNOSTIC IMAGING  ORDERABLES     PATIENT SURVEYS:  Quick Dash 20.5 and FOTO 55  COGNITION: Overall cognitive status: Within functional limits for tasks assessed     SENSATION: WFL  POSTURE: Forward head Protracted shoulders bilaterally Very stiff cervical region and left shoulder  UPPER EXTREMITY ROM:   Active ROM Right eval Left eval  Shoulder flexion 160 *135  Shoulder extension  *62  Shoulder abduction 162 *130  Shoulder adduction    Shoulder internal rotation  *58  Shoulder external rotation  *49  Elbow flexion  WNL  Elbow extension  WNL  Wrist flexion    Wrist extension    Wrist ulnar deviation    Wrist radial deviation    Wrist pronation    Wrist supination    (Blank rows = not tested) *= pain and empty end feel today  UPPER EXTREMITY MMT:  MMT Right eval Left eval  Shoulder flexion 5 4/5 within available ROM  Shoulder extension 5 4  Shoulder abduction 5 4  Shoulder adduction 5 4  Shoulder internal rotation 5 4  Shoulder external rotation 5 4  Middle trapezius    Lower trapezius    Elbow flexion 5 4  Elbow extension 5 4  Wrist flexion    Wrist extension    Wrist ulnar deviation    Wrist radial deviation    Wrist pronation    Wrist supination    Grip strength (lbs)    (Blank rows = not tested)  SHOULDER SPECIAL TESTS: Impingement tests: Neer impingement test: positive , Hawkins/Kennedy impingement test: positive , and Painful arc test: negative Instability tests: Sulcus sign: negative Rotator cuff assessment: Drop arm test: negative and Full can test: negative Biceps assessment: Yergason's test: negative and Speed's test: positive   JOINT MOBILITY TESTING:  Hypomobile GH joint with inf/PA/AP mobilty  PALPATION:  (+) tenderness superior shoulder with some anterior pain with end range motion   TODAY'S TREATMENT:  DATE:    Heat donned L shoulder x 15 min. Pt reports nothing currently on shoulder that would prohibit use of heat.  No adverse reaction to use of heat observed or reported.  On plinth: PVC pipe bench press 2x20 PVC shoulder flexion within pain-free ROM 2x10 2# dumbbell LUE serratus punch 2x10 Supine L shoulder abduction 2x10 PVC L shoulder ER 1x25 pain-limited, cuing to adjust ROM to reduce pain RTB L shoulder IR 2x15 Seated shoulder shrugs 1x10  Wall push-up 2x10   Wall ladder L abduction 2x through - more limited than flexion  Wall ladder L flexion 2x through  Isometric with towel against wall 10x5 sec hold/rep for flexion. Cuing for modulation of intensity (only in pain-free range/gentle push) Isometric with towel on wall 5x5 sec holds for L shoulder ext   PATIENT EDUCATION: Education details: Pt educated throughout session about proper posture and technique with exercises. Improved exercise technique, movement at target joints, use of target muscles after min to mod verbal, visual, tactile cues.  Person educated: Patient Education method: Explanation, Demonstration, Tactile cues, and Verbal cues Education comprehension: verbalized understanding, returned demonstration, verbal cues required, tactile cues required, and needs further education  HOME EXERCISE PROGRAM: Access Code: [email or web address] URL: [email or web address] Date: 03/17/2022 Prepared by: Sande Brothers  Exercises - Supine Shoulder Flexion Extension AAROM with Dowel  - 1 x daily - 7 x weekly - 3 sets - 10 reps - Supine Shoulder Abduction AAROM with Dowel  - 1 x daily - 7 x weekly - 3 sets - 10 reps - Supine Shoulder External Rotation in 45 Degrees Abduction AAROM with Dowel  - 1 x daily - 7 x weekly - 3 sets - 10 reps - Standing Shoulder Internal Rotation Stretch with Towel  - 1 x daily - 7 x weekly - 3 sets - 10 reps - Standing Bilateral Shoulder Internal Rotation AAROM with Dowel  - 1 x daily - 7 x weekly  - 3 sets - 10 reps  ASSESSMENT:  CLINICAL IMPRESSION: Initiated strengthening interventions. Pt noted to be most limited with abduction and ER movements, but generally tolerated majority of interventions well. He was cued throughout to modify range/intensity if an intervention felt uncomfortable to achieve pain-free range. He will benefit from skilled PT services to assist in restoring mobility, functional strength and overhead use with decreased pain for optimal functional independence in his home and in the community.   OBJECTIVE IMPAIRMENTS: decreased ROM, decreased strength, hypomobility, increased fascial restrictions, impaired flexibility, impaired UE functional use, and pain.   ACTIVITY LIMITATIONS: carrying, lifting, sleeping, dressing, and reach over head  PARTICIPATION LIMITATIONS: cleaning, laundry, community activity, and yard work  PERSONAL FACTORS:  none  are also affecting patient's functional outcome.   REHAB POTENTIAL: Good  CLINICAL DECISION MAKING: Stable/uncomplicated  EVALUATION COMPLEXITY: Low   GOALS: Goals reviewed with patient? Yes  SHORT TERM GOALS: Target date: 04/28/2022  Pt will be independent with HEP in order to improve strength and decrease pain in order to improve pain-free function at home and work. Baseline: Eval Goal status: INITIAL  2.   Pt will decrease worst pain in left shoulder as reported on NPRS by at least 1 points in order to demonstrate reduction in pain. Baseline: EVAL= 8/10 Goal status: INITIAL   LONG TERM GOALS: Target date: 06/09/2022  Pt will decrease quick DASH score by at least 8% in order to demonstrate clinically significant reduction in disability. Baseline: EVAL=20.5 Goal status: INITIAL  2.  Pt will improve  FOTO to target score of 67 to display perceived improvements in ability to complete ADL's.  Baseline: EVAL: 55 Goal status: INITIAL  3.  Pt will decrease worst pain in left shoulder as reported on NPRS by at least  3 points in order to demonstrate clinically significant reduction in pain. Baseline: EVAL Goal status: INITIAL  4.  Pt will increase strength of  by at least 1/2 MMT grade in order to demonstrate improvement in strength and function  Baseline: EVAL= 4/5 in available range with shoulder flex/abd/ER/IR. Goal status: INITIAL    PLAN:  PT FREQUENCY: 1-2x/week  PT DURATION: 12 weeks  PLANNED INTERVENTIONS: Therapeutic exercises, Therapeutic activity, Neuromuscular re-education, Patient/Family education, Self Care, Joint mobilization, Dry Needling, Electrical stimulation, Spinal manipulation, Spinal mobilization, Cryotherapy, Moist heat, Taping, Ultrasound, and Manual therapy  PLAN FOR NEXT SESSION: Review and progress painfree shoulder ROM; perform manual therapy as appropriate; progress HEP, and begin gentle therex as appropriate, continue plan   Zollie Pee, PT 03/27/2022, 1:22 PM

## 2022-03-28 LAB — PSA: Prostate Specific Ag, Serum: 0.1 ng/mL (ref 0.0–4.0)

## 2022-03-31 ENCOUNTER — Ambulatory Visit: Payer: Medicare PPO

## 2022-03-31 DIAGNOSIS — M6281 Muscle weakness (generalized): Secondary | ICD-10-CM

## 2022-03-31 DIAGNOSIS — G8929 Other chronic pain: Secondary | ICD-10-CM

## 2022-03-31 NOTE — Therapy (Signed)
OUTPATIENT PHYSICAL THERAPY SHOULDER TREATMENT NOTE   Patient Name: Nicholas [name] MRN: [id number] DOB:12-07-1945, 77 y.o., male Today's Date: 03/31/2022  END OF SESSION:  PT End of Session - 03/31/22 1401     Visit Number 3    Number of Visits 24    Date for PT Re-Evaluation 06/09/22    Progress Note Due on Visit 10    PT Start Time 9470    PT Stop Time 1226    PT Time Calculation (min) 49 min    Activity Tolerance Patient tolerated treatment well;No increased pain    Behavior During Therapy [facility] for tasks assessed/performed              Past Medical History:  Diagnosis Date   A-fib (Nicholas [name])    Cancer (Nicholas [name]) 10/25/2019   Prostate   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   Dysrhythmia    Pulmonary embolism ([facility])    Skin cancer    TIA (transient ischemic attack)    Past Surgical History:  Procedure Laterality Date   APPENDECTOMY     COLONOSCOPY WITH PROPOFOL N/A 11/23/2019   Procedure: COLONOSCOPY WITH PROPOFOL;  Surgeon: [name], Nicholas Pike, MD;  Location: [facility] ENDOSCOPY;  Service: Gastroenterology;  Laterality: N/A;   cyber knife surgery     PROSTATE SURGERY     Patient Active Problem List   Diagnosis Date Noted   COVID-19 virus infection 02/17/2021   A-fib (Nicholas [name])    TIA (transient ischemic attack)    Lactic acidosis    Hyponatremia    Hypotension    Generalized weakness     PCP: Dr. Emily Filbert  REFERRING PROVIDER: Dr. Hessie [name]  REFERRING DIAG: OA L Shoulder   THERAPY DIAG:  Chronic left shoulder pain  Muscle weakness (generalized)  Rationale for Evaluation and Treatment: Rehabilitation  ONSET DATE: 01/08/2022  SUBJECTIVE:                                                                                                                                                                                      SUBJECTIVE STATEMENT: Pt reports his back has been stiff recently. He reports L shoulder pain is currently a 1/10. Pt reports he was a little sore  following last PT session but this improved within ~48 hours.  PERTINENT HISTORY: Patient is a 77 year old male with chronic history of left shoulder pain since [place] but progressively worse over past 2-3 months. He report intermittent left shoulder pain with active movement and stiffness some at rest. Patient was most recently discharged last month from outpatient PT for gait/balance issues.   PAIN:  taken at eval Are you having pain? Yes: NPRS scale: 8/10 Pain location: Left  shoulder  Pain description: intermittent ache sometimes at rest/sharp with movement Aggravating factors: overhead reaching; reaching cross body Relieving factors: rest- have not really tried heat/ice and I havent taken any pain meds  PRECAUTIONS: Fall  WEIGHT BEARING RESTRICTIONS: No  FALLS:  Has patient fallen in last 6 months? No  LIVING ENVIRONMENT: Lives with: lives with their spouse Lives in: House/apartment Stairs: Yes: External: 4 steps; can reach both Has following equipment at home: Single point cane and Walker - 2 wheeled  OCCUPATION: Retired   PLOF: Independent  PATIENT GOALS: learn what to do to get over this pain in my shoulder   NEXT MD VISIT: Dr. Rudene Christians- sometime in March per patient report  OBJECTIVE:   DIAGNOSTIC FINDINGS:  02/05/2022 12:26 PM EST  AP and scapular Y x-rays of the left shoulder were ordered and personally reviewed today. These show glenohumeral degenerative changes at the inferior glenoid and inferior humeral head. Scapular Y view shows normal subacromial space and no deformity. Minimal AC arthritis.  X-ray Impression Moderate glenohumeral osteoarthritis, left shoulder.     Imaging Results - X-ray shoulder complete left minimum 2 views (02/05/2022 8:47 AM EST) Authorizing Provider Result Type  Nicholas Poag MD IMG DIAGNOSTIC IMAGING ORDERABLES     PATIENT SURVEYS:  Quick Dash 20.5 and FOTO  55  COGNITION: Overall cognitive status: Within functional limits for tasks assessed     SENSATION: WFL  POSTURE: Forward head Protracted shoulders bilaterally Very stiff cervical region and left shoulder  UPPER EXTREMITY ROM:   Active ROM Right eval Left eval  Shoulder flexion 160 *135  Shoulder extension  *62  Shoulder abduction 162 *130  Shoulder adduction    Shoulder internal rotation  *58  Shoulder external rotation  *49  Elbow flexion  WNL  Elbow extension  WNL  Wrist flexion    Wrist extension    Wrist ulnar deviation    Wrist radial deviation    Wrist pronation    Wrist supination    (Blank rows = not tested) *= pain and empty end feel today  UPPER EXTREMITY MMT:  MMT Right eval Left eval  Shoulder flexion 5 4/5 within available ROM  Shoulder extension 5 4  Shoulder abduction 5 4  Shoulder adduction 5 4  Shoulder internal rotation 5 4  Shoulder external rotation 5 4  Middle trapezius    Lower trapezius    Elbow flexion 5 4  Elbow extension 5 4  Wrist flexion    Wrist extension    Wrist ulnar deviation    Wrist radial deviation    Wrist pronation    Wrist supination    Grip strength (lbs)    (Blank rows = not tested)  SHOULDER SPECIAL TESTS: Impingement tests: Neer impingement test: positive , Hawkins/Kennedy impingement test: positive , and Painful arc test: negative Instability tests: Sulcus sign: negative Rotator cuff assessment: Drop arm test: negative and Full can test: negative Biceps assessment: Yergason's test: negative and Speed's test: positive   JOINT MOBILITY TESTING:  Hypomobile GH joint with inf/PA/AP mobilty  PALPATION:  (+) tenderness superior shoulder with some anterior pain with end range motion   TODAY'S TREATMENT:  DATE:   Heat donned to L shoulder and across low back x 15 min while  pt performs the following. Pt reports nothing currently on or applied to shoulder or low that would prohibit use of heat.  No adverse reaction to use of heat observed or reported, pt reports heat feels good.  On plinth: PVC shoulder flexion within pain-free ROM 1x20 B PVC pipe bench press 1x20 B  2# bar: PVC shoulder flexion within pain-free range 1x20 B bench press 1x20 B  3# dumbbell LUE serratus punch 1x15  Supine snow angels 2x10 B (to promote abduction)  3# dumbbell LUE bicep curl 2x15  Seated stability ball roll out FWD/BCKWD/LTL for shoulder flexion, adduction/abd  x 5 minutes  RTB LUE ER/IR 2x15 for each movement  Seated LUE snow angel 12x   Wall push-up 1x12, 1x15 with BUE  Shoulder shrugs 15x BUE  Wall ladder abd/flex x 2 rounds- able to perform [facility] abd and flex pain-free  Matrix cable machine rows @ 7.5# 1x11, 1x15 B. Rates easy   UBE bike level 1.5-2 x 4 min (2 min FWD/2 min BCKWD)  No pain with interventions    PATIENT EDUCATION: Education details: Pt educated throughout session about proper posture and technique with exercises. Improved exercise technique, movement at target joints, use of target muscles after min to mod verbal, visual, tactile cues.  Person educated: Patient Education method: Explanation, Demonstration, Tactile cues, and Verbal cues Education comprehension: verbalized understanding, returned demonstration, verbal cues required, tactile cues required, and needs further education  HOME EXERCISE PROGRAM: Access Code: [email or web address] URL: [email or web address] Date: 03/17/2022 Prepared by: Sande Brothers  Exercises - Supine Shoulder Flexion Extension AAROM with Dowel  - 1 x daily - 7 x weekly - 3 sets - 10 reps - Supine Shoulder Abduction AAROM with Dowel  - 1 x daily - 7 x weekly - 3 sets - 10 reps - Supine Shoulder External Rotation in 45 Degrees Abduction AAROM with Dowel  - 1 x daily - 7 x weekly - 3 sets - 10 reps -  Standing Shoulder Internal Rotation Stretch with Towel  - 1 x daily - 7 x weekly - 3 sets - 10 reps - Standing Bilateral Shoulder Internal Rotation AAROM with Dowel  - 1 x daily - 7 x weekly - 3 sets - 10 reps  ASSESSMENT:  CLINICAL IMPRESSION: Pt shows improvement today by performing wall ladder L shoulder flex/ab to [facility] ROM without pain. He was also able to progress to light-weighted UE strengthening. ER continues to be a main deficit for pt, however. The pt will benefit from skilled PT services to assist in restoring mobility, functional strength and overhead use with decreased pain for optimal functional independence in his home and in the community.   OBJECTIVE IMPAIRMENTS: decreased ROM, decreased strength, hypomobility, increased fascial restrictions, impaired flexibility, impaired UE functional use, and pain.   ACTIVITY LIMITATIONS: carrying, lifting, sleeping, dressing, and reach over head  PARTICIPATION LIMITATIONS: cleaning, laundry, community activity, and yard work  PERSONAL FACTORS:  none  are also affecting patient's functional outcome.   REHAB POTENTIAL: Good  CLINICAL DECISION MAKING: Stable/uncomplicated  EVALUATION COMPLEXITY: Low   GOALS: Goals reviewed with patient? Yes  SHORT TERM GOALS: Target date: 04/28/2022  Pt will be independent with HEP in order to improve strength and decrease pain in order to improve pain-free function at home and work. Baseline: Eval Goal status: INITIAL  2.   Pt will decrease worst pain in left shoulder as reported  on NPRS by at least 1 points in order to demonstrate reduction in pain. Baseline: EVAL= 8/10 Goal status: INITIAL   LONG TERM GOALS: Target date: 06/09/2022  Pt will decrease quick DASH score by at least 8% in order to demonstrate clinically significant reduction in disability. Baseline: EVAL=20.5 Goal status: INITIAL  2.  Pt will improve FOTO to target score of 67 to display perceived improvements in ability to  complete ADL's.  Baseline: EVAL: 55 Goal status: INITIAL  3.  Pt will decrease worst pain in left shoulder as reported on NPRS by at least 3 points in order to demonstrate clinically significant reduction in pain. Baseline: EVAL Goal status: INITIAL  4.  Pt will increase strength of  by at least 1/2 MMT grade in order to demonstrate improvement in strength and function  Baseline: EVAL= 4/5 in available range with shoulder flex/abd/ER/IR. Goal status: INITIAL    PLAN:  PT FREQUENCY: 1-2x/week  PT DURATION: 12 weeks  PLANNED INTERVENTIONS: Therapeutic exercises, Therapeutic activity, Neuromuscular re-education, Patient/Family education, Self Care, Joint mobilization, Dry Needling, Electrical stimulation, Spinal manipulation, Spinal mobilization, Cryotherapy, Moist heat, Taping, Ultrasound, and Manual therapy  PLAN FOR NEXT SESSION: Review and progress painfree shoulder ROM; perform manual therapy as appropriate; progress HEP, and begin gentle therex as appropriate, continue plan   Zollie Pee, PT 03/31/2022, 2:06 PM

## 2022-04-03 ENCOUNTER — Ambulatory Visit: Payer: Medicare Other

## 2022-04-03 ENCOUNTER — Ambulatory Visit: Payer: Medicare PPO | Admitting: Urology

## 2022-04-03 ENCOUNTER — Ambulatory Visit: Payer: Medicare PPO

## 2022-04-03 ENCOUNTER — Encounter: Payer: Self-pay | Admitting: Urology

## 2022-04-03 VITALS — BP 102/68 | HR 68 | Ht 76.0 in | Wt 265.0 lb

## 2022-04-03 DIAGNOSIS — Z8546 Personal history of malignant neoplasm of prostate: Secondary | ICD-10-CM

## 2022-04-03 DIAGNOSIS — M6281 Muscle weakness (generalized): Secondary | ICD-10-CM | POA: Diagnosis not present

## 2022-04-03 DIAGNOSIS — Z09 Encounter for follow-up examination after completed treatment for conditions other than malignant neoplasm: Secondary | ICD-10-CM

## 2022-04-03 DIAGNOSIS — M25619 Stiffness of unspecified shoulder, not elsewhere classified: Secondary | ICD-10-CM

## 2022-04-03 DIAGNOSIS — G8929 Other chronic pain: Secondary | ICD-10-CM

## 2022-04-03 NOTE — Progress Notes (Signed)
04/03/2022 11:32 AM   Buddy Duty June 30, 1945 [id number]  Referring provider: Rusty Aus, MD [facility] [place],  [place] [address]  Chief Complaint  Patient presents with   Elevated PSA    Urologic history: 1. 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  Microhematuria 04/2020 CTU bilateral renal cysts and 4 mm nonobstructing left renal calculus No lower tract abnormalities on cystoscopy-prostate enlargement with hypervascularity Cytology negative  HPI: 77 y.o. male presents for annual follow-up.  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   Dysrhythmia    Pulmonary embolism ([facility])    Skin cancer    TIA (transient ischemic attack)     Surgical History: Past Surgical History:  Procedure Laterality Date   APPENDECTOMY     COLONOSCOPY WITH PROPOFOL N/A 11/23/2019   Procedure: COLONOSCOPY WITH PROPOFOL;  Surgeon: Toledo, Benay Pike, MD;  Location: [facility] ENDOSCOPY;  Service: Gastroenterology;  Laterality: N/A;   cyber knife surgery     PROSTATE SURGERY      Home Medications:  Allergies as of 04/03/2022       Reactions   Penicillins Other (See Comments)   Allopurinol Hives        Medication List        Accurate as of April 03, 2022 11:32 AM. If you have any questions, ask your nurse or doctor.          aspirin EC 81 MG tablet Take 81 mg by mouth daily. Swallow whole.   atenolol 25 MG tablet Commonly known as: TENORMIN Take 12.5 mg by mouth 2 (two) times daily.   colchicine 0.6 MG  tablet Take 0.6 mg by mouth as needed.   febuxostat 40 MG tablet Commonly known as: ULORIC Take 40 mg by mouth daily.   hydrocortisone 2.5 % cream APPLY EXTERNALLY TO THE AFFECTED AREA EVERY DAY ON TUES, THURSDAY, SATURDAY AS DIRECTED   multivitamin-lutein Caps capsule Take 1 capsule by mouth daily.   vitamin C 250 MG tablet Commonly known as: ASCORBIC ACID Take 1 tablet (250 mg total) by mouth 2 (two) times daily.   Xarelto 20 MG Tabs tablet Generic drug: rivaroxaban Take 20 mg by mouth daily.   zinc sulfate 220 (50 Zn) MG capsule Commonly known as: Zinc-220 Take 1 capsule (220 mg total) by mouth daily.        Allergies:  Allergies  Allergen Reactions   Penicillins Other (See Comments)   Allopurinol Hives    Family History: Family History  Problem Relation Age of Onset   Hypertension Mother     Social History:  reports that he has never smoked. He has never used smokeless tobacco. He reports that he does not drink alcohol and does not use drugs.   Physical Exam: BP 102/68   Pulse 68   Ht '6\' 4"'$  (1.93 m)   Wt 265 lb (120.2 kg)   BMI 32.26 kg/m   Constitutional:  Alert and oriented, No acute distress. HEENT: [place] AT Respiratory: Normal respiratory effort, no increased work of breathing. Psychiatric: Normal mood and affect.   Assessment & Plan:  1.  History prostate cancer PSA remains low and stable Continue annual follow-up   Abbie Sons, MD  [facility] [address] [place], [place] [address] [phone]

## 2022-04-03 NOTE — Therapy (Signed)
OUTPATIENT PHYSICAL THERAPY SHOULDER TREATMENT NOTE   Patient Name: Nicholas [name] MRN: [id number] DOB:1945-03-15, 77 y.o., male Today's Date: 04/03/2022  END OF SESSION:  PT End of Session - 04/03/22 1338     Visit Number 4    Number of Visits 24    Date for PT Re-Evaluation 06/09/22    Progress Note Due on Visit 10    PT Start Time 1001    PT Stop Time 1607    PT Time Calculation (min) 43 min    Activity Tolerance Patient tolerated treatment well    Behavior During Therapy [facility] for tasks assessed/performed               Past Medical History:  Diagnosis Date   A-fib ([place])    Cancer ([place]) 10/25/2019   Prostate   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   Dysrhythmia    Pulmonary embolism ([facility])    Skin cancer    TIA (transient ischemic attack)    Past Surgical History:  Procedure Laterality Date   APPENDECTOMY     COLONOSCOPY WITH PROPOFOL N/A 11/23/2019   Procedure: COLONOSCOPY WITH PROPOFOL;  Surgeon: Toledo, Benay Pike, MD;  Location: [facility] ENDOSCOPY;  Service: Gastroenterology;  Laterality: N/A;   cyber knife surgery     PROSTATE SURGERY     Patient Active Problem List   Diagnosis Date Noted   COVID-19 virus infection 02/17/2021   A-fib ([place])    TIA (transient ischemic attack)    Lactic acidosis    Hyponatremia    Hypotension    Generalized weakness     PCP: Dr. Emily Filbert  REFERRING PROVIDER: Dr. Hessie Knows  REFERRING DIAG: OA L Shoulder   THERAPY DIAG:  Muscle weakness (generalized)  Limited range of motion (ROM) of shoulder  Chronic left shoulder pain  Rationale for Evaluation and Treatment: Rehabilitation  ONSET DATE: 01/08/2022  SUBJECTIVE:                                                                                                                                                                                      SUBJECTIVE STATEMENT: Pt reports soreness following last appointment. He reports it is better now. And that he has no  pain currently. No other updates reported.   PERTINENT HISTORY: Patient is a 77 year old male with chronic history of left shoulder pain since stra[place] several years ago but progressively worse over past 2-3 months. He report intermittent left shoulder pain with active movement and stiffness some at rest. Patient was most recently discharged last month from outpatient PT for gait/balance issues.   PAIN: taken at eval  Are you having pain? Yes: NPRS scale: 8/10 Pain location: Left  shoulder  Pain description: intermittent ache sometimes at rest/sharp with movement Aggravating factors: overhead reaching; reaching cross body Relieving factors: rest- have not really tried heat/ice and I havent taken any pain meds  PRECAUTIONS: Fall  WEIGHT BEARING RESTRICTIONS: No  FALLS:  Has patient fallen in last 6 months? No  LIVING ENVIRONMENT: Lives with: lives with their spouse Lives in: House/apartment Stairs: Yes: External: 4 steps; can reach both Has following equipment at home: Single point cane and Walker - 2 wheeled  OCCUPATION: Retired   PLOF: Independent  PATIENT GOALS: learn what to do to get over this pain in my shoulder   NEXT MD VISIT: Dr. Rudene Christians- sometime in March per patient report  OBJECTIVE:   DIAGNOSTIC FINDINGS:  02/05/2022 12:26 PM EST  AP and scapular Y x-rays of the left shoulder were ordered and personally reviewed today. These show glenohumeral degenerative changes at the inferior glenoid and inferior humeral head. Scapular Y view shows normal subacromial space and no deformity. Minimal AC arthritis.  X-ray Impression Moderate glenohumeral osteoarthritis, left shoulder.     Imaging Results - X-ray shoulder complete left minimum 2 views (02/05/2022 8:47 AM EST) Authorizing Provider Result Type  Lauris Poag MD IMG DIAGNOSTIC IMAGING ORDERABLES     PATIENT SURVEYS:  Quick Dash 20.5 and FOTO 55  COGNITION: Overall  cognitive status: Within functional limits for tasks assessed     SENSATION: WFL  POSTURE: Forward head Protracted shoulders bilaterally Very stiff cervical region and left shoulder  UPPER EXTREMITY ROM:   Active ROM Right eval Left eval  Shoulder flexion 160 *135  Shoulder extension  *62  Shoulder abduction 162 *130  Shoulder adduction    Shoulder internal rotation  *58  Shoulder external rotation  *49  Elbow flexion  WNL  Elbow extension  WNL  Wrist flexion    Wrist extension    Wrist ulnar deviation    Wrist radial deviation    Wrist pronation    Wrist supination    (Blank rows = not tested) *= pain and empty end feel today  UPPER EXTREMITY MMT:  MMT Right eval Left eval  Shoulder flexion 5 4/5 within available ROM  Shoulder extension 5 4  Shoulder abduction 5 4  Shoulder adduction 5 4  Shoulder internal rotation 5 4  Shoulder external rotation 5 4  Middle trapezius    Lower trapezius    Elbow flexion 5 4  Elbow extension 5 4  Wrist flexion    Wrist extension    Wrist ulnar deviation    Wrist radial deviation    Wrist pronation    Wrist supination    Grip strength (lbs)    (Blank rows = not tested)  SHOULDER SPECIAL TESTS: Impingement tests: Neer impingement test: positive , Hawkins/Kennedy impingement test: positive , and Painful arc test: negative Instability tests: Sulcus sign: negative Rotator cuff assessment: Drop arm test: negative and Full can test: negative Biceps assessment: Yergason's test: negative and Speed's test: positive   JOINT MOBILITY TESTING:  Hypomobile GH joint with inf/PA/AP mobilty  PALPATION:  (+) tenderness superior shoulder with some anterior pain with end range motion   TODAY'S TREATMENT:  DATE:   TherEx: UBE bike level 2 x 5 min (2.5 min FWD/2.5 min BCKWD)  Matrix cable machine  seated rows B 7.5# 10x 17.5# 10x 22.5# 10x - discontinued as pt reported feeling twinge in shoulder although not completely painful  Shrugs 10x B Shrugs holding 5# dumbbells each UE 20x B  RTB standing shoulder ER/pull apart 20x B  Wall push-up 20x   Wall ladder abd/flex x 1 round- able to perform WFL abd and flex pain-free  Tricep ext 12.5# 15x. 8x bilat. Second set discontinued as pt reported mild pain felt with 2 reps.  RTB wall-walks with isometric shoulder ER bilat x 3 min. No pain  On plinth: 2.5# bar: -shoulder flexion within pain-free range 1x20 B -bench press 1x20 B  5# dumbbell LUE serratus punch 1x20  RTB LUE horizontal adduction 2x15   Bodyblade 60 sec LUE only, 60 BUE  Supine snow angels 20x B (to promote abduction) - able to achieve about 90 deg comfortably with LUE     PATIENT EDUCATION: Education details: Pt educated throughout session about proper posture and technique with exercises. Improved exercise technique, movement at target joints, use of target muscles after min to mod verbal, visual, tactile cues.  Person educated: Patient Education method: Explanation, Demonstration, Tactile cues, and Verbal cues Education comprehension: verbalized understanding, returned demonstration, verbal cues required, tactile cues required, and needs further education  HOME EXERCISE PROGRAM: Access Code: [email or web address] URL: [email or web address] Date: 03/17/2022 Prepared by: Sande Brothers  Exercises - Supine Shoulder Flexion Extension AAROM with Dowel  - 1 x daily - 7 x weekly - 3 sets - 10 reps - Supine Shoulder Abduction AAROM with Dowel  - 1 x daily - 7 x weekly - 3 sets - 10 reps - Supine Shoulder External Rotation in 45 Degrees Abduction AAROM with Dowel  - 1 x daily - 7 x weekly - 3 sets - 10 reps - Standing Shoulder Internal Rotation Stretch with Towel  - 1 x daily - 7 x weekly - 3 sets - 10 reps - Standing Bilateral Shoulder Internal Rotation  AAROM with Dowel  - 1 x daily - 7 x weekly - 3 sets - 10 reps  ASSESSMENT:  CLINICAL IMPRESSION: Pt continues to progress interventions with increased resistance. He is able to perform full abduction with wall ladder intervention, but is limited with LUE abduction with snow angel exercise in supine. Pt also with "twinges" felt in LUE with tricep ext and rows, interventions discontinued as a result. All other interventions were pain-free. The pt will benefit from skilled PT services to assist in restoring mobility, functional strength and overhead use with decreased pain for optimal functional independence in his home and in the community.   OBJECTIVE IMPAIRMENTS: decreased ROM, decreased strength, hypomobility, increased fascial restrictions, impaired flexibility, impaired UE functional use, and pain.   ACTIVITY LIMITATIONS: carrying, lifting, sleeping, dressing, and reach over head  PARTICIPATION LIMITATIONS: cleaning, laundry, community activity, and yard work  PERSONAL FACTORS:  none  are also affecting patient's functional outcome.   REHAB POTENTIAL: Good  CLINICAL DECISION MAKING: Stable/uncomplicated  EVALUATION COMPLEXITY: Low   GOALS: Goals reviewed with patient? Yes  SHORT TERM GOALS: Target date: 04/28/2022  Pt will be independent with HEP in order to improve strength and decrease pain in order to improve pain-free function at home and work. Baseline: Eval Goal status: INITIAL  2.   Pt will decrease worst pain in left shoulder as reported on NPRS by at least 1 points  in order to demonstrate reduction in pain. Baseline: EVAL= 8/10 Goal status: INITIAL   LONG TERM GOALS: Target date: 06/09/2022  Pt will decrease quick DASH score by at least 8% in order to demonstrate clinically significant reduction in disability. Baseline: EVAL=20.5 Goal status: INITIAL  2.  Pt will improve FOTO to target score of 67 to display perceived improvements in ability to complete ADL's.   Baseline: EVAL: 55 Goal status: INITIAL  3.  Pt will decrease worst pain in left shoulder as reported on NPRS by at least 3 points in order to demonstrate clinically significant reduction in pain. Baseline: EVAL Goal status: INITIAL  4.  Pt will increase strength of  by at least 1/2 MMT grade in order to demonstrate improvement in strength and function  Baseline: EVAL= 4/5 in available range with shoulder flex/abd/ER/IR. Goal status: INITIAL    PLAN:  PT FREQUENCY: 1-2x/week  PT DURATION: 12 weeks  PLANNED INTERVENTIONS: Therapeutic exercises, Therapeutic activity, Neuromuscular re-education, Patient/Family education, Self Care, Joint mobilization, Dry Needling, Electrical stimulation, Spinal manipulation, Spinal mobilization, Cryotherapy, Moist heat, Taping, Ultrasound, and Manual therapy  PLAN FOR NEXT SESSION: Review and progress painfree shoulder ROM; perform manual therapy as appropriate; progress HEP, and begin gentle therex as appropriate, continue plan   Zollie Pee, PT 04/03/2022, 1:46 PM

## 2022-04-07 ENCOUNTER — Ambulatory Visit: Payer: Medicare PPO

## 2022-04-07 DIAGNOSIS — M545 Low back pain, unspecified: Secondary | ICD-10-CM

## 2022-04-07 DIAGNOSIS — M6281 Muscle weakness (generalized): Secondary | ICD-10-CM

## 2022-04-07 DIAGNOSIS — G8929 Other chronic pain: Secondary | ICD-10-CM

## 2022-04-07 DIAGNOSIS — M25562 Pain in left knee: Secondary | ICD-10-CM

## 2022-04-07 DIAGNOSIS — R2681 Unsteadiness on feet: Secondary | ICD-10-CM

## 2022-04-07 DIAGNOSIS — M25619 Stiffness of unspecified shoulder, not elsewhere classified: Secondary | ICD-10-CM

## 2022-04-07 NOTE — Therapy (Signed)
OUTPATIENT PHYSICAL THERAPY SHOULDER TREATMENT NOTE   Patient Name: Nicholas [name] MRN: [id number] DOB:October 23, 1945, 77 y.o., male Today's Date: 04/07/2022  END OF SESSION:  PT End of Session - 04/07/22 1215     Visit Number 5    Number of Visits 24    Date for PT Re-Evaluation 06/09/22    PT Start Time 1022    PT Stop Time 1104    PT Time Calculation (min) 42 min    Equipment Utilized During Treatment Gait belt    Activity Tolerance Patient tolerated treatment well    Behavior During Therapy WFL for tasks assessed/performed               Past Medical History:  Diagnosis Date   A-fib ([place])    Cancer ([place]) 10/25/2019   Prostate   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   Dysrhythmia    Pulmonary embolism ([facility])    Skin cancer    TIA (transient ischemic attack)    Past Surgical History:  Procedure Laterality Date   APPENDECTOMY     COLONOSCOPY WITH PROPOFOL N/A 11/23/2019   Procedure: COLONOSCOPY WITH PROPOFOL;  Surgeon: Toledo, Benay Pike, MD;  Location: [facility] ENDOSCOPY;  Service: Gastroenterology;  Laterality: N/A;   cyber knife surgery     PROSTATE SURGERY     Patient Active Problem List   Diagnosis Date Noted   COVID-19 virus infection 02/17/2021   A-fib ([place])    TIA (transient ischemic attack)    Lactic acidosis    Hyponatremia    Hypotension    Generalized weakness     PCP: Dr. Emily Filbert  REFERRING PROVIDER: Dr. Hessie Knows  REFERRING DIAG: OA L Shoulder   THERAPY DIAG:  Muscle weakness (generalized)  Limited range of motion (ROM) of shoulder  Chronic left shoulder pain  Unsteadiness on feet  Chronic bilateral low back pain, unspecified whether sciatica present  Acute pain of left knee  Rationale for Evaluation and Treatment: Rehabilitation  ONSET DATE: 01/08/2022  SUBJECTIVE:                                                                                                                                                                                       SUBJECTIVE STATEMENT: Pt reports of pain at the end range. Also stated that he furniture walks at home because feeling unsteady on feet.  PERTINENT HISTORY: Patient is a 77 year old male with chronic history of left shoulder pain since [place] but progressively worse over past 2-3 months. He report intermittent left shoulder pain with active movement and stiffness some at rest. Patient was  most recently discharged last month from outpatient PT for gait/balance issues.   PAIN: taken at eval Are you having pain? Yes: NPRS scale: 8/10 Pain location: Left  shoulder  Pain description: intermittent ache sometimes at rest/sharp with movement Aggravating factors: overhead reaching; reaching cross body Relieving factors: rest- have not really tried heat/ice and I havent taken any pain meds  PRECAUTIONS: Fall  WEIGHT BEARING RESTRICTIONS: No  FALLS:  Has patient fallen in last 6 months? No  LIVING ENVIRONMENT: Lives with: lives with their spouse Lives in: House/apartment Stairs: Yes: External: 4 steps; can reach both Has following equipment at home: Single point cane and Walker - 2 wheeled  OCCUPATION: Retired   PLOF: Independent  PATIENT GOALS: learn what to do to get over this pain in my shoulder   NEXT MD VISIT: Dr. Rudene Christians- sometime in March per patient report  OBJECTIVE:   DIAGNOSTIC FINDINGS:  02/05/2022 12:26 PM EST  AP and scapular Y x-rays of the left shoulder were ordered and personally reviewed today. These show glenohumeral degenerative changes at the inferior glenoid and inferior humeral head. Scapular Y view shows normal subacromial space and no deformity. Minimal AC arthritis.  X-ray Impression Moderate glenohumeral osteoarthritis, left shoulder.     Imaging Results - X-ray shoulder complete left minimum 2 views (02/05/2022 8:47 AM EST) Authorizing Provider Result Type  Lauris Poag MD IMG  DIAGNOSTIC IMAGING ORDERABLES     PATIENT SURVEYS:  Quick Dash 20.5 and FOTO 55  COGNITION: Overall cognitive status: Within functional limits for tasks assessed     SENSATION: WFL  POSTURE: Forward head Protracted shoulders bilaterally Very stiff cervical region and left shoulder  UPPER EXTREMITY ROM:   Active ROM Right eval Left eval  Shoulder flexion 160 *135  Shoulder extension  *62  Shoulder abduction 162 *130  Shoulder adduction    Shoulder internal rotation  *58  Shoulder external rotation  *49  Elbow flexion  WNL  Elbow extension  WNL  Wrist flexion    Wrist extension    Wrist ulnar deviation    Wrist radial deviation    Wrist pronation    Wrist supination    (Blank rows = not tested) *= pain and empty end feel today  UPPER EXTREMITY MMT:  MMT Right eval Left eval  Shoulder flexion 5 4/5 within available ROM  Shoulder extension 5 4  Shoulder abduction 5 4  Shoulder adduction 5 4  Shoulder internal rotation 5 4  Shoulder external rotation 5 4  Middle trapezius    Lower trapezius    Elbow flexion 5 4  Elbow extension 5 4  Wrist flexion    Wrist extension    Wrist ulnar deviation    Wrist radial deviation    Wrist pronation    Wrist supination    Grip strength (lbs)    (Blank rows = not tested)  SHOULDER SPECIAL TESTS: Impingement tests: Neer impingement test: positive , Hawkins/Kennedy impingement test: positive , and Painful arc test: negative Instability tests: Sulcus sign: negative Rotator cuff assessment: Drop arm test: negative and Full can test: negative Biceps assessment: Yergason's test: negative and Speed's test: positive   JOINT MOBILITY TESTING:  Hypomobile GH joint with inf/PA/AP mobilty  PALPATION:  (+) tenderness superior shoulder with some anterior pain with end range motion   TODAY'S TREATMENT:  DATE:   TherEx: UBE bike level 2 x 8 min(4 min FWD/4 min BCKWD) Shrugs #4 2 x 30 secs Shrugs holding 5# dumbbells each UE 20x B Scapular retractions #4 2 x 30 secs Shldr felxion and abduction in supine  using #4 want  Chest press with #7.5 wanf 2 x 30 secs Circles with Ball on wall for prorpiocetion  Manual Therapy: 10 mins L shoulder Jt PROM grade II/II/IV relief with posterior glide.  PROM to L shldr with Scapula stabilized AAROM to L shoulder with muscle setting.   Not performed:  RTB standing shoulder ER/pull apart 20x B  Wall push-up 20x   Wall ladder abd/flex x 1 round- able to perform [facility] abd and flex pain-free  Tricep ext 12.5# 15x. 8x bilat. Second set discontinued as pt reported mild pain felt with 2 reps.  RTB wall-walks with isometric shoulder ER bilat x 3 min. No pain  On plinth: 2.5# bar: -shoulder flexion within pain-free range 1x20 B -bench press 1x20 B  5# dumbbell LUE serratus punch 1x20  RTB LUE horizontal adduction 2x15   Bodyblade 60 sec LUE only, 60 BUE  Supine snow angels 20x B (to promote abduction) - able to achieve about 90 deg comfortably with LUE  Matrix cable machine seated rows B 7.5# 10x 17.5# 10x 22.5# 10x - discontinued as pt reported feeling twinge in shoulder although not completely painful    PATIENT EDUCATION: Education details: Pt educated throughout session about proper posture and technique with exercises. Improved exercise technique, movement at target joints, use of target muscles after min to mod verbal, visual, tactile cues.  Person educated: Patient Education method: Explanation, Demonstration, Tactile cues, and Verbal cues Education comprehension: verbalized understanding, returned demonstration, verbal cues required, tactile cues required, and needs further education  HOME EXERCISE PROGRAM: Access Code: [email or web address] URL: [email or web address] Date: 03/17/2022 Prepared by: Sande Brothers  Exercises - Supine Shoulder Flexion Extension AAROM with Dowel  - 1 x daily - 7 x weekly - 3 sets - 10 reps - Supine Shoulder Abduction AAROM with Dowel  - 1 x daily - 7 x weekly - 3 sets - 10 reps - Supine Shoulder External Rotation in 45 Degrees Abduction AAROM with Dowel  - 1 x daily - 7 x weekly - 3 sets - 10 reps - Standing Shoulder Internal Rotation Stretch with Towel  - 1 x daily - 7 x weekly - 3 sets - 10 reps - Standing Bilateral Shoulder Internal Rotation AAROM with Dowel  - 1 x daily - 7 x weekly - 3 sets - 10 reps  ASSESSMENT:  CLINICAL IMPRESSION: Pt continues to progress interventions with increased resistance. Continue to demonstrate improvement with ROM. Pt demonstrates weakness and anteriorly translated Humerus 2/2 to forward head and rounded shoulder posture. Pt will continue to benefit form Jt ROM PROM, isometrics and postural training to improve joint integrity .  All other interventions were pain-free. Pt continues to demonstrate fair standing dynamic balance and therefore PT advised pt to use SPC to prevent fall. Pt compliance to using SPC is questionable. Reinforcement maybe beneficial to pt.  Pt The pt will benefit from skilled PT services to assist in restoring mobility, functional strength and overhead use with decreased pain for optimal functional independence in his home and in the community.   OBJECTIVE IMPAIRMENTS: decreased ROM, decreased strength, hypomobility, increased fascial restrictions, impaired flexibility, impaired UE functional use, and pain.   ACTIVITY LIMITATIONS: carrying, lifting, sleeping, dressing, and reach over head  PARTICIPATION  LIMITATIONS: cleaning, laundry, community activity, and yard work  PERSONAL FACTORS:  none  are also affecting patient's functional outcome.   REHAB POTENTIAL: Good  CLINICAL DECISION MAKING: Stable/uncomplicated  EVALUATION COMPLEXITY: Low   GOALS: Goals reviewed with patient? Yes  SHORT TERM  GOALS: Target date: 04/28/2022  Pt will be independent with HEP in order to improve strength and decrease pain in order to improve pain-free function at home and work. Baseline: Eval Goal status: INITIAL  2.   Pt will decrease worst pain in left shoulder as reported on NPRS by at least 1 points in order to demonstrate reduction in pain. Baseline: EVAL= 8/10 Goal status: INITIAL   LONG TERM GOALS: Target date: 06/09/2022  Pt will decrease quick DASH score by at least 8% in order to demonstrate clinically significant reduction in disability. Baseline: EVAL=20.5 Goal status: INITIAL  2.  Pt will improve FOTO to target score of 67 to display perceived improvements in ability to complete ADL's.  Baseline: EVAL: 55 Goal status: INITIAL  3.  Pt will decrease worst pain in left shoulder as reported on NPRS by at least 3 points in order to demonstrate clinically significant reduction in pain. Baseline: EVAL Goal status: INITIAL  4.  Pt will increase strength of  by at least 1/2 MMT grade in order to demonstrate improvement in strength and function  Baseline: EVAL= 4/5 in available range with shoulder flex/abd/ER/IR. Goal status: INITIAL    PLAN:  PT FREQUENCY: 1-2x/week  PT DURATION: 12 weeks  PLANNED INTERVENTIONS: Therapeutic exercises, Therapeutic activity, Neuromuscular re-education, Patient/Family education, Self Care, Joint mobilization, Dry Needling, Electrical stimulation, Spinal manipulation, Spinal mobilization, Cryotherapy, Moist heat, Taping, Ultrasound, and Manual therapy  PLAN FOR NEXT SESSION: Review and progress painfree shoulder ROM; perform manual therapy as appropriate; progress HEP, and begin gentle therex as appropriate, continue plan  Joaquin Music PT DPT 12:38 PM,04/07/22

## 2022-04-10 ENCOUNTER — Ambulatory Visit: Payer: Medicare Other

## 2022-04-10 ENCOUNTER — Ambulatory Visit: Payer: Medicare PPO | Attending: Internal Medicine

## 2022-04-10 DIAGNOSIS — R42 Dizziness and giddiness: Secondary | ICD-10-CM | POA: Diagnosis present

## 2022-04-10 DIAGNOSIS — G8929 Other chronic pain: Secondary | ICD-10-CM | POA: Insufficient documentation

## 2022-04-10 DIAGNOSIS — M6281 Muscle weakness (generalized): Secondary | ICD-10-CM | POA: Diagnosis present

## 2022-04-10 DIAGNOSIS — M25619 Stiffness of unspecified shoulder, not elsewhere classified: Secondary | ICD-10-CM | POA: Insufficient documentation

## 2022-04-10 DIAGNOSIS — M25512 Pain in left shoulder: Secondary | ICD-10-CM | POA: Insufficient documentation

## 2022-04-10 DIAGNOSIS — R2681 Unsteadiness on feet: Secondary | ICD-10-CM | POA: Insufficient documentation

## 2022-04-10 NOTE — Therapy (Signed)
OUTPATIENT PHYSICAL THERAPY SHOULDER TREATMENT NOTE   Patient Name: Nicholas [name] MRN: [id number] DOB:August 28, 1945, 77 y.o., male Today's Date: 04/10/2022  END OF SESSION:  PT End of Session - 04/10/22 1023     Visit Number 6    Number of Visits 24    Date for PT Re-Evaluation 06/09/22    PT Start Time 1017    PT Stop Time 1058    PT Time Calculation (min) 41 min    Equipment Utilized During Treatment Gait belt    Activity Tolerance Patient tolerated treatment well    Behavior During Therapy WFL for tasks assessed/performed                Past Medical History:  Diagnosis Date   A-fib ([place])    Cancer ([place]) 10/25/2019   Prostate   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   Dysrhythmia    Pulmonary embolism ([facility])    Skin cancer    TIA (transient ischemic attack)    Past Surgical History:  Procedure Laterality Date   APPENDECTOMY     COLONOSCOPY WITH PROPOFOL N/A 11/23/2019   Procedure: COLONOSCOPY WITH PROPOFOL;  Surgeon: Toledo, Benay Pike, MD;  Location: [facility] ENDOSCOPY;  Service: Gastroenterology;  Laterality: N/A;   cyber knife surgery     PROSTATE SURGERY     Patient Active Problem List   Diagnosis Date Noted   COVID-19 virus infection 02/17/2021   A-fib ([place])    TIA (transient ischemic attack)    Lactic acidosis    Hyponatremia    Hypotension    Generalized weakness     PCP: Dr. Emily Filbert  REFERRING PROVIDER: Dr. Hessie Knows  REFERRING DIAG: OA L Shoulder   THERAPY DIAG:  Chronic left shoulder pain  Limited range of motion (ROM) of shoulder  Muscle weakness (generalized)  Rationale for Evaluation and Treatment: Rehabilitation  ONSET DATE: 01/08/2022  SUBJECTIVE:                                                                                                                                                                                      SUBJECTIVE STATEMENT:   Pt reports no L shoulder pain at rest but that particular movements still  increase pain, such as reaching behind himself.    PERTINENT HISTORY: Patient is a 77 year old male with chronic history of left shoulder pain since [place] but progressively worse over past 2-3 months. He report intermittent left shoulder pain with active movement and stiffness some at rest. Patient was most recently discharged last month from outpatient PT for gait/balance issues.   PAIN: taken at  eval Are you having pain? Yes: NPRS scale: 8/10 Pain location: Left  shoulder  Pain description: intermittent ache sometimes at rest/sharp with movement Aggravating factors: overhead reaching; reaching cross body Relieving factors: rest- have not really tried heat/ice and I havent taken any pain meds  PRECAUTIONS: Fall  WEIGHT BEARING RESTRICTIONS: No  FALLS:  Has patient fallen in last 6 months? No  LIVING ENVIRONMENT: Lives with: lives with their spouse Lives in: House/apartment Stairs: Yes: External: 4 steps; can reach both Has following equipment at home: Single point cane and Walker - 2 wheeled  OCCUPATION: Retired   PLOF: Independent  PATIENT GOALS: learn what to do to get over this pain in my shoulder   NEXT MD VISIT: Dr. Rudene Christians- sometime in March per patient report  OBJECTIVE:   DIAGNOSTIC FINDINGS:  02/05/2022 12:26 PM EST  AP and scapular Y x-rays of the left shoulder were ordered and personally reviewed today. These show glenohumeral degenerative changes at the inferior glenoid and inferior humeral head. Scapular Y view shows normal subacromial space and no deformity. Minimal AC arthritis.  X-ray Impression Moderate glenohumeral osteoarthritis, left shoulder.     Imaging Results - X-ray shoulder complete left minimum 2 views (02/05/2022 8:47 AM EST) Authorizing Provider Result Type  Lauris Poag MD IMG DIAGNOSTIC IMAGING ORDERABLES     PATIENT SURVEYS:  Quick Dash 20.5 and FOTO  55  COGNITION: Overall cognitive status: Within functional limits for tasks assessed     SENSATION: WFL  POSTURE: Forward head Protracted shoulders bilaterally Very stiff cervical region and left shoulder  UPPER EXTREMITY ROM:   Active ROM Right eval Left eval  Shoulder flexion 160 *135  Shoulder extension  *62  Shoulder abduction 162 *130  Shoulder adduction    Shoulder internal rotation  *58  Shoulder external rotation  *49  Elbow flexion  WNL  Elbow extension  WNL  Wrist flexion    Wrist extension    Wrist ulnar deviation    Wrist radial deviation    Wrist pronation    Wrist supination    (Blank rows = not tested) *= pain and empty end feel today  UPPER EXTREMITY MMT:  MMT Right eval Left eval  Shoulder flexion 5 4/5 within available ROM  Shoulder extension 5 4  Shoulder abduction 5 4  Shoulder adduction 5 4  Shoulder internal rotation 5 4  Shoulder external rotation 5 4  Middle trapezius    Lower trapezius    Elbow flexion 5 4  Elbow extension 5 4  Wrist flexion    Wrist extension    Wrist ulnar deviation    Wrist radial deviation    Wrist pronation    Wrist supination    Grip strength (lbs)    (Blank rows = not tested)  SHOULDER SPECIAL TESTS: Impingement tests: Neer impingement test: positive , Hawkins/Kennedy impingement test: positive , and Painful arc test: negative Instability tests: Sulcus sign: negative Rotator cuff assessment: Drop arm test: negative and Full can test: negative Biceps assessment: Yergason's test: negative and Speed's test: positive   JOINT MOBILITY TESTING:  Hypomobile GH joint with inf/PA/AP mobilty  PALPATION:  (+) tenderness superior shoulder with some anterior pain with end range motion   TODAY'S TREATMENT:  DATE:   TherEx: UBE bike level 2 x 5 min(2 min 30 sec FWD/2 min 30  sec BCKWD) - unbilled  On plinth: Supine shoulder flexion 15x  bilat Supine 5# bar shoulder flexion 15x bilat Supine 5# bar chest press 20x 2 sets bilat. Pt reports initial discomfort in L lateral shoulder that then diminished/resolved with reps. [place] L shoulder ER with 2# 15x, 4# 15x, 5# 10x Body blade BUE x 60 sec, LUE only x 60 sec Snow angels 20x bilat (promote pain-free abduction)  Prone on plinth: 2 rounds Is 10x bilat Ts 10x bilat Ys  - unable to perform today due to pain with position   LUE IR with cane x multiple reps, cued to perform until pt feels gentle-stretch  Wall ladder LUE shoulder abduction/flexion 2 rounds of each. Performs WFL ROM pain-free   Wall push-up 20x. Rates easy    PATIENT EDUCATION: Education details: Pt educated throughout session about proper posture and technique with exercises. Improved exercise technique, movement at target joints, use of target muscles after min to mod verbal, visual, tactile cues.  Person educated: Patient Education method: Explanation, Demonstration, Tactile cues, and Verbal cues Education comprehension: verbalized understanding, returned demonstration, verbal cues required, tactile cues required, and needs further education  HOME EXERCISE PROGRAM: Access Code: [email or web address] URL: [email or web address] Date: 03/17/2022 Prepared by: Sande Brothers  Exercises - Supine Shoulder Flexion Extension AAROM with Dowel  - 1 x daily - 7 x weekly - 3 sets - 10 reps - Supine Shoulder Abduction AAROM with Dowel  - 1 x daily - 7 x weekly - 3 sets - 10 reps - Supine Shoulder External Rotation in 45 Degrees Abduction AAROM with Dowel  - 1 x daily - 7 x weekly - 3 sets - 10 reps - Standing Shoulder Internal Rotation Stretch with Towel  - 1 x daily - 7 x weekly - 3 sets - 10 reps - Standing Bilateral Shoulder Internal Rotation AAROM with Dowel  - 1 x daily - 7 x weekly - 3 sets - 10 reps  ASSESSMENT:  CLINICAL  IMPRESSION: Pt exhibits WFL L shoulder abd/flex AAROM with wall-ladder that is pain-free. He was able to perform majority of shoulder strengthening interventions without pain or discomfort. He did note some mild lateral delt pain (LUE) with chest-press. However, this resolved with reps. Pt was pain-limited with prone Y intervention, and could not perform this today as a result. The pt will benefit from further skilled PT to improve LUE pain, ROM and mobility to increase QOL and ease with ADLs.  OBJECTIVE IMPAIRMENTS: decreased ROM, decreased strength, hypomobility, increased fascial restrictions, impaired flexibility, impaired UE functional use, and pain.   ACTIVITY LIMITATIONS: carrying, lifting, sleeping, dressing, and reach over head  PARTICIPATION LIMITATIONS: cleaning, laundry, community activity, and yard work  PERSONAL FACTORS:  none  are also affecting patient's functional outcome.   REHAB POTENTIAL: Good  CLINICAL DECISION MAKING: Stable/uncomplicated  EVALUATION COMPLEXITY: Low   GOALS: Goals reviewed with patient? Yes  SHORT TERM GOALS: Target date: 04/28/2022  Pt will be independent with HEP in order to improve strength and decrease pain in order to improve pain-free function at home and work. Baseline: Eval Goal status: INITIAL  2.   Pt will decrease worst pain in left shoulder as reported on NPRS by at least 1 points in order to demonstrate reduction in pain. Baseline: EVAL= 8/10 Goal status: INITIAL   LONG TERM GOALS: Target date: 06/09/2022  Pt will decrease quick DASH score by  at least 8% in order to demonstrate clinically significant reduction in disability. Baseline: EVAL=20.5 Goal status: INITIAL  2.  Pt will improve FOTO to target score of 67 to display perceived improvements in ability to complete ADL's.  Baseline: EVAL: 55 Goal status: INITIAL  3.  Pt will decrease worst pain in left shoulder as reported on NPRS by at least 3 points in order to  demonstrate clinically significant reduction in pain. Baseline: EVAL Goal status: INITIAL  4.  Pt will increase strength of  by at least 1/2 MMT grade in order to demonstrate improvement in strength and function  Baseline: EVAL= 4/5 in available range with shoulder flex/abd/ER/IR. Goal status: INITIAL    PLAN:  PT FREQUENCY: 1-2x/week  PT DURATION: 12 weeks  PLANNED INTERVENTIONS: Therapeutic exercises, Therapeutic activity, Neuromuscular re-education, Patient/Family education, Self Care, Joint mobilization, Dry Needling, Electrical stimulation, Spinal manipulation, Spinal mobilization, Cryotherapy, Moist heat, Taping, Ultrasound, and Manual therapy  PLAN FOR NEXT SESSION: Review and progress painfree shoulder ROM; perform manual therapy as appropriate; progress HEP, UE strengthening as appropriate Ricard Dillon PT, DPT  3:59 PM,04/10/22

## 2022-04-14 ENCOUNTER — Ambulatory Visit: Payer: Medicare PPO

## 2022-04-14 DIAGNOSIS — G8929 Other chronic pain: Secondary | ICD-10-CM

## 2022-04-14 DIAGNOSIS — M25512 Pain in left shoulder: Secondary | ICD-10-CM | POA: Diagnosis not present

## 2022-04-14 DIAGNOSIS — M6281 Muscle weakness (generalized): Secondary | ICD-10-CM

## 2022-04-14 NOTE — Therapy (Signed)
OUTPATIENT PHYSICAL THERAPY SHOULDER TREATMENT NOTE   Patient Name: Nicholas [name] MRN: [id number] DOB:1945/06/12, 77 y.o., male Today's Date: 04/14/2022  END OF SESSION:  PT End of Session - 04/14/22 1151     Visit Number 7    Number of Visits 24    Date for PT Re-Evaluation 06/09/22    PT Start Time 1149    PT Stop Time 1230    PT Time Calculation (min) 41 min    Equipment Utilized During Treatment Gait belt    Activity Tolerance Patient tolerated treatment well    Behavior During Therapy WFL for tasks assessed/performed                Past Medical History:  Diagnosis Date   A-fib ([facility])    Cancer ([place]) 10/25/2019   Prostate   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   Dysrhythmia    Pulmonary embolism ([place])    Skin cancer    TIA (transient ischemic attack)    Past Surgical History:  Procedure Laterality Date   APPENDECTOMY     COLONOSCOPY WITH PROPOFOL N/A 11/23/2019   Procedure: COLONOSCOPY WITH PROPOFOL;  Surgeon: Toledo, Benay Pike, MD;  Location: [facility] ENDOSCOPY;  Service: Gastroenterology;  Laterality: N/A;   cyber knife surgery     PROSTATE SURGERY     Patient Active Problem List   Diagnosis Date Noted   COVID-19 virus infection 02/17/2021   A-fib ([place])    TIA (transient ischemic attack)    Lactic acidosis    Hyponatremia    Hypotension    Generalized weakness     PCP: Dr. Emily Filbert  REFERRING PROVIDER: Dr. Hessie Knows  REFERRING DIAG: OA L Shoulder   THERAPY DIAG:  Chronic left shoulder pain  Muscle weakness (generalized)  Rationale for Evaluation and Treatment: Rehabilitation  ONSET DATE: 01/08/2022  SUBJECTIVE:                                                                                                                                                                                      SUBJECTIVE STATEMENT:   Pt reports no shoulder pain at rest. He was sore after last visit but it improved. He stood a lot at [employer] this weekend  and reports his legs felt tired.   PERTINENT HISTORY: Patient is a 77 year old male with chronic history of left shoulder pain since stra[place] arm [place] amusement [place] several years ago but progressively worse over past 2-3 months. He report intermittent left shoulder pain with active movement and stiffness some at rest. Patient was most recently discharged last month from outpatient PT for gait/balance issues.   PAIN: taken  at eval Are you having pain? Yes: NPRS scale: 8/10 Pain location: Left  shoulder  Pain description: intermittent ache sometimes at rest/sharp with movement Aggravating factors: overhead reaching; reaching cross body Relieving factors: rest- have not really tried heat/ice and I havent taken any pain meds  PRECAUTIONS: Fall  WEIGHT BEARING RESTRICTIONS: No  FALLS:  Has patient fallen in last 6 months? No  LIVING ENVIRONMENT: Lives with: lives with their spouse Lives in: House/apartment Stairs: Yes: External: 4 steps; can reach both Has following equipment at home: Single point cane and Walker - 2 wheeled  OCCUPATION: Retired   PLOF: Independent  PATIENT GOALS: learn what to do to get over this pain in my shoulder   NEXT MD VISIT: Dr. Rudene Christians- sometime in March per patient report  OBJECTIVE:   DIAGNOSTIC FINDINGS:  02/05/2022 12:26 PM EST  AP and scapular Y x-rays of the left shoulder were ordered and personally reviewed today. These show glenohumeral degenerative changes at the inferior glenoid and inferior humeral head. Scapular Y view shows normal subacromial space and no deformity. Minimal AC arthritis.  X-ray Impression Moderate glenohumeral osteoarthritis, left shoulder.     Imaging Results - X-ray shoulder complete left minimum 2 views (02/05/2022 8:47 AM EST) Authorizing Provider Result Type  Lauris Poag MD IMG DIAGNOSTIC IMAGING ORDERABLES     PATIENT SURVEYS:  Quick Dash 20.5 and FOTO 55  COGNITION: Overall cognitive  status: Within functional limits for tasks assessed     SENSATION: WFL  POSTURE: Forward head Protracted shoulders bilaterally Very stiff cervical region and left shoulder  UPPER EXTREMITY ROM:   Active ROM Right eval Left eval  Shoulder flexion 160 *135  Shoulder extension  *62  Shoulder abduction 162 *130  Shoulder adduction    Shoulder internal rotation  *58  Shoulder external rotation  *49  Elbow flexion  WNL  Elbow extension  WNL  Wrist flexion    Wrist extension    Wrist ulnar deviation    Wrist radial deviation    Wrist pronation    Wrist supination    (Blank rows = not tested) *= pain and empty end feel today  UPPER EXTREMITY MMT:  MMT Right eval Left eval  Shoulder flexion 5 4/5 within available ROM  Shoulder extension 5 4  Shoulder abduction 5 4  Shoulder adduction 5 4  Shoulder internal rotation 5 4  Shoulder external rotation 5 4  Middle trapezius    Lower trapezius    Elbow flexion 5 4  Elbow extension 5 4  Wrist flexion    Wrist extension    Wrist ulnar deviation    Wrist radial deviation    Wrist pronation    Wrist supination    Grip strength (lbs)    (Blank rows = not tested)  SHOULDER SPECIAL TESTS: Impingement tests: Neer impingement test: positive , Hawkins/Kennedy impingement test: positive , and Painful arc test: negative Instability tests: Sulcus sign: negative Rotator cuff assessment: Drop arm test: negative and Full can test: negative Biceps assessment: Yergason's test: negative and Speed's test: positive   JOINT MOBILITY TESTING:  Hypomobile GH joint with inf/PA/AP mobilty  PALPATION:  (+) tenderness superior shoulder with some anterior pain with end range motion   TODAY'S TREATMENT:  DATE:   TherEx: UBE bike level 4 x 5 min(2 min 30 sec FWD/2 min 30 sec BCKWD) - unbilled  On  plinth: Body blade BUE, LUE only 2x 60 sec Body blade LUE only 2x 60 sec. Fatiguing  Supine 5# bar shoulder flexion 21x bilat Supine 5# bar chest press 21x 2 sets bilat. Pt rates easy, reports no pain. 7# serratus punch 16x. Pt reports feeling some strain with rep 14  Sidelye L shoulder ER 5# 1x15, 1x16. Rates medium. Reports not painful but weak, does require adjustment of ROM for comfort Snow angels 21x bilat. Pt reports pain-free throughout   Prone on plinth: 2 rounds Is 12x bilat Ts 12x bilat (modified for decreased abduction as pt feeling pain in full position)  Table top position x 30 sec   LUE IR with cane 20x,  cued to perform until pt feels gentle-stretch  Seated rows 10 reps at each weight 12.5#, 17.5#, 22.5#   Seated RTB ER/pull-aparts 20x bilat   PATIENT EDUCATION: Education details: Pt educated throughout session about proper posture and technique with exercises. Improved exercise technique, movement at target joints, use of target muscles after min to mod verbal, visual, tactile cues.  Person educated: Patient Education method: Explanation, Demonstration, Tactile cues, and Verbal cues Education comprehension: verbalized understanding, returned demonstration, verbal cues required, tactile cues required, and needs further education  HOME EXERCISE PROGRAM: Access Code: [email or web address] URL: [email or web address] Date: 03/17/2022 Prepared by: Sande Brothers  Exercises - Supine Shoulder Flexion Extension AAROM with Dowel  - 1 x daily - 7 x weekly - 3 sets - 10 reps - Supine Shoulder Abduction AAROM with Dowel  - 1 x daily - 7 x weekly - 3 sets - 10 reps - Supine Shoulder External Rotation in 45 Degrees Abduction AAROM with Dowel  - 1 x daily - 7 x weekly - 3 sets - 10 reps - Standing Shoulder Internal Rotation Stretch with Towel  - 1 x daily - 7 x weekly - 3 sets - 10 reps - Standing Bilateral Shoulder Internal Rotation AAROM with Dowel  - 1 x daily - 7 x  weekly - 3 sets - 10 reps  ASSESSMENT:  CLINICAL IMPRESSION: Pt able to progress interventions with level of resistance and/or reps completed. He reports majority of interventions as pain-free. However, pt still limited with L shoulder extension in an abducted position. The pt will benefit from further skilled PT to improve LUE pain, ROM and mobility to increase QOL and ease with ADLs.  OBJECTIVE IMPAIRMENTS: decreased ROM, decreased strength, hypomobility, increased fascial restrictions, impaired flexibility, impaired UE functional use, and pain.   ACTIVITY LIMITATIONS: carrying, lifting, sleeping, dressing, and reach over head  PARTICIPATION LIMITATIONS: cleaning, laundry, community activity, and yard work  PERSONAL FACTORS:  none  are also affecting patient's functional outcome.   REHAB POTENTIAL: Good  CLINICAL DECISION MAKING: Stable/uncomplicated  EVALUATION COMPLEXITY: Low   GOALS: Goals reviewed with patient? Yes  SHORT TERM GOALS: Target date: 04/28/2022  Pt will be independent with HEP in order to improve strength and decrease pain in order to improve pain-free function at home and work. Baseline: Eval Goal status: INITIAL  2.   Pt will decrease worst pain in left shoulder as reported on NPRS by at least 1 points in order to demonstrate reduction in pain. Baseline: EVAL= 8/10 Goal status: INITIAL   LONG TERM GOALS: Target date: 06/09/2022  Pt will decrease quick DASH score by at least 8% in order to demonstrate clinically  significant reduction in disability. Baseline: EVAL=20.5 Goal status: INITIAL  2.  Pt will improve FOTO to target score of 67 to display perceived improvements in ability to complete ADL's.  Baseline: EVAL: 55 Goal status: INITIAL  3.  Pt will decrease worst pain in left shoulder as reported on NPRS by at least 3 points in order to demonstrate clinically significant reduction in pain. Baseline: EVAL Goal status: INITIAL  4.  Pt will increase  strength of  by at least 1/2 MMT grade in order to demonstrate improvement in strength and function  Baseline: EVAL= 4/5 in available range with shoulder flex/abd/ER/IR. Goal status: INITIAL    PLAN:  PT FREQUENCY: 1-2x/week  PT DURATION: 12 weeks  PLANNED INTERVENTIONS: Therapeutic exercises, Therapeutic activity, Neuromuscular re-education, Patient/Family education, Self Care, Joint mobilization, Dry Needling, Electrical stimulation, Spinal manipulation, Spinal mobilization, Cryotherapy, Moist heat, Taping, Ultrasound, and Manual therapy  PLAN FOR NEXT SESSION: Review and progress painfree shoulder ROM; perform manual therapy as appropriate; progress HEP, UE strengthening as appropriate Ricard Dillon PT, DPT  2:12 PM,04/14/22

## 2022-04-17 ENCOUNTER — Ambulatory Visit: Payer: Medicare Other

## 2022-04-17 ENCOUNTER — Ambulatory Visit: Payer: Medicare PPO

## 2022-04-17 DIAGNOSIS — M25512 Pain in left shoulder: Secondary | ICD-10-CM | POA: Diagnosis not present

## 2022-04-17 DIAGNOSIS — M25619 Stiffness of unspecified shoulder, not elsewhere classified: Secondary | ICD-10-CM

## 2022-04-17 DIAGNOSIS — R2681 Unsteadiness on feet: Secondary | ICD-10-CM

## 2022-04-17 DIAGNOSIS — M6281 Muscle weakness (generalized): Secondary | ICD-10-CM

## 2022-04-17 DIAGNOSIS — G8929 Other chronic pain: Secondary | ICD-10-CM

## 2022-04-17 NOTE — Therapy (Signed)
OUTPATIENT PHYSICAL THERAPY SHOULDER TREATMENT NOTE   Patient Name: Nicholas [name] MRN: [id number] DOB:09-22-1945, 77 y.o., male Today's Date: 04/17/2022  END OF SESSION:  PT End of Session - 04/17/22 1112     Visit Number 8    Number of Visits 24    Date for PT Re-Evaluation 06/09/22    Authorization Type [employer] PPO    Authorization Time Period 03/17/22-06/09/22    Progress Note Due on Visit 10    PT Start Time 1102    PT Stop Time 1142    PT Time Calculation (min) 40 min    Activity Tolerance Patient tolerated treatment well;No increased pain    Behavior During Therapy [facility] for tasks assessed/performed                Past Medical History:  Diagnosis Date   A-fib ([place])    Cancer ([place]) 10/25/2019   Prostate   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   Dysrhythmia    Pulmonary embolism ([facility])    Skin cancer    TIA (transient ischemic attack)    Past Surgical History:  Procedure Laterality Date   APPENDECTOMY     COLONOSCOPY WITH PROPOFOL N/A 11/23/2019   Procedure: COLONOSCOPY WITH PROPOFOL;  Surgeon: Toledo, Benay Pike, MD;  Location: [facility] ENDOSCOPY;  Service: Gastroenterology;  Laterality: N/A;   cyber knife surgery     PROSTATE SURGERY     Patient Active Problem List   Diagnosis Date Noted   COVID-19 virus infection 02/17/2021   A-fib ([place])    TIA (transient ischemic attack)    Lactic acidosis    Hyponatremia    Hypotension    Generalized weakness     PCP: Dr. Emily Filbert  REFERRING PROVIDER: Dr. Hessie Knows  REFERRING DIAG: OA L Shoulder   THERAPY DIAG:  Chronic left shoulder pain  Muscle weakness (generalized)  Limited range of motion (ROM) of shoulder  Unsteadiness on feet  Rationale for Evaluation and Treatment: Rehabilitation  ONSET DATE: 01/08/2022  SUBJECTIVE:                                                                                                                                                                                       SUBJECTIVE STATEMENT: Pt reports no shoulder pain at rest. He was sore after last visit but it improved. He stood a lot at [employer] this weekend and reports his legs felt tired.   PERTINENT HISTORY: Patient is a 77 year old male with chronic history of left shoulder pain since [place] but progressively worse over past 2-3 months. He report intermittent left  shoulder pain with active movement and stiffness some at rest. Patient was most recently discharged last month from outpatient PT for gait/balance issues.   PAIN: taken at eval Are you having pain? Yes: NPRS scale: 8/10 Pain location: Left  shoulder  Pain description: intermittent ache sometimes at rest/sharp with movement Aggravating factors: overhead reaching; reaching cross body Relieving factors: rest- have not really tried heat/ice and I havent taken any pain meds  PRECAUTIONS: Fall  WEIGHT BEARING RESTRICTIONS: No  FALLS:  Has patient fallen in last 6 months? No   PATIENT GOALS: learn what to do to get over this pain in my shoulder   NEXT MD VISIT: Dr. Rudene Christians- sometime in March per patient report  OBJECTIVE:    TODAY'S TREATMENT:                                                                                                                                         DATE:   TherEx: Moderate resistance loading c free weights in open chain, all within pain free ROM of each:  -AA/ROM hyperperfusion UBE x 4 minutes alternating -shoulder abduction 2x15 -shoulder flexion 2x15 -resisted cable row 2x15 -band ER 2x15 -shoulder ER in 90/90 2x15    PATIENT EDUCATION: Education details: Pt educated throughout session about proper posture and technique with exercises. Improved exercise technique, movement at target joints, use of target muscles after min to mod verbal, visual, tactile cues.  Person educated: Patient Education method: Explanation, Demonstration, Tactile  cues, and Verbal cues Education comprehension: verbalized understanding, returned demonstration, verbal cues required, tactile cues required, and needs further education  HOME EXERCISE PROGRAM: Access Code: [email or web address] URL: [email or web address] Date: 03/17/2022 Prepared by: Sande Brothers  Exercises - Supine Shoulder Flexion Extension AAROM with Dowel  - 1 x daily - 7 x weekly - 3 sets - 10 reps - Supine Shoulder Abduction AAROM with Dowel  - 1 x daily - 7 x weekly - 3 sets - 10 reps - Supine Shoulder External Rotation in 45 Degrees Abduction AAROM with Dowel  - 1 x daily - 7 x weekly - 3 sets - 10 reps - Standing Shoulder Internal Rotation Stretch with Towel  - 1 x daily - 7 x weekly - 3 sets - 10 reps - Standing Bilateral Shoulder Internal Rotation AAROM with Dowel  - 1 x daily - 7 x weekly - 3 sets - 10 reps  ASSESSMENT:  CLINICAL IMPRESSION: Pt able to progress interventions with level of resistance and/or reps completed. He reports majority of interventions as pain-free. However, pt still limited with L shoulder extension in an abducted position. The pt will benefit from further skilled PT to improve LUE pain, ROM and mobility to increase QOL and ease with ADLs.  OBJECTIVE IMPAIRMENTS: decreased ROM, decreased strength, hypomobility, increased fascial restrictions, impaired flexibility, impaired UE functional use, and pain.   ACTIVITY LIMITATIONS: carrying, lifting, sleeping, dressing, and  reach over head  PARTICIPATION LIMITATIONS: cleaning, laundry, community activity, and yard work  PERSONAL FACTORS:  none  are also affecting patient's functional outcome.   REHAB POTENTIAL: Good  CLINICAL DECISION MAKING: Stable/uncomplicated  EVALUATION COMPLEXITY: Low   GOALS: Goals reviewed with patient? Yes  SHORT TERM GOALS: Target date: 04/28/2022  Pt will be independent with HEP in order to improve strength and decrease pain in order to improve pain-free function  at home and work. Baseline: Eval Goal status: INITIAL  2.   Pt will decrease worst pain in left shoulder as reported on NPRS by at least 1 points in order to demonstrate reduction in pain. Baseline: EVAL= 8/10 Goal status: INITIAL   LONG TERM GOALS: Target date: 06/09/2022  Pt will decrease quick DASH score by at least 8% in order to demonstrate clinically significant reduction in disability. Baseline: EVAL=20.5 Goal status: INITIAL  2.  Pt will improve FOTO to target score of 67 to display perceived improvements in ability to complete ADL's.  Baseline: EVAL: 55 Goal status: INITIAL  3.  Pt will decrease worst pain in left shoulder as reported on NPRS by at least 3 points in order to demonstrate clinically significant reduction in pain. Baseline: EVAL Goal status: INITIAL  4.  Pt will increase strength of  by at least 1/2 MMT grade in order to demonstrate improvement in strength and function  Baseline: EVAL= 4/5 in available range with shoulder flex/abd/ER/IR. Goal status: INITIAL    PLAN:  PT FREQUENCY: 1-2x/week  PT DURATION: 12 weeks  PLANNED INTERVENTIONS: Therapeutic exercises, Therapeutic activity, Neuromuscular re-education, Patient/Family education, Self Care, Joint mobilization, Dry Needling, Electrical stimulation, Spinal manipulation, Spinal mobilization, Cryotherapy, Moist heat, Taping, Ultrasound, and Manual therapy  PLAN FOR NEXT SESSION: Review and progress painfree shoulder ROM; perform manual therapy as appropriate; progress HEP, UE strengthening as appropriate  11:15 AM, 04/17/22 [name], PT, DPT Physical Therapist - [place] [phone]     11:15 AM,04/17/22

## 2022-04-21 ENCOUNTER — Ambulatory Visit: Payer: Medicare PPO

## 2022-04-21 DIAGNOSIS — G8929 Other chronic pain: Secondary | ICD-10-CM

## 2022-04-21 DIAGNOSIS — M25512 Pain in left shoulder: Secondary | ICD-10-CM | POA: Diagnosis not present

## 2022-04-21 DIAGNOSIS — M6281 Muscle weakness (generalized): Secondary | ICD-10-CM

## 2022-04-21 NOTE — Therapy (Signed)
OUTPATIENT PHYSICAL THERAPY SHOULDER TREATMENT NOTE   Patient Name: Nicholas [name] MRN: [id number] DOB:07/17/1945, 77 y.o., male Today's Date: 04/21/2022  END OF SESSION:  PT End of Session - 04/21/22 1153     Visit Number 9    Number of Visits 24    Date for PT Re-Evaluation 06/09/22    Authorization Type [employer] PPO    Authorization Time Period 03/17/22-06/09/22    Progress Note Due on Visit 10    PT Start Time 1147    PT Stop Time 1229    PT Time Calculation (min) 42 min    Activity Tolerance Patient tolerated treatment well;No increased pain    Behavior During Therapy [facility] for tasks assessed/performed                 Past Medical History:  Diagnosis Date   A-fib ([place])    Cancer ([place]) 10/25/2019   Prostate   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   Dysrhythmia    Pulmonary embolism ([facility])    Skin cancer    TIA (transient ischemic attack)    Past Surgical History:  Procedure Laterality Date   APPENDECTOMY     COLONOSCOPY WITH PROPOFOL N/A 11/23/2019   Procedure: COLONOSCOPY WITH PROPOFOL;  Surgeon: Toledo, Benay Pike, MD;  Location: [facility] ENDOSCOPY;  Service: Gastroenterology;  Laterality: N/A;   cyber knife surgery     PROSTATE SURGERY     Patient Active Problem List   Diagnosis Date Noted   COVID-19 virus infection 02/17/2021   A-fib ([place])    TIA (transient ischemic attack)    Lactic acidosis    Hyponatremia    Hypotension    Generalized weakness     PCP: Dr. Emily Filbert  REFERRING PROVIDER: Dr. Hessie Knows  REFERRING DIAG: OA L Shoulder   THERAPY DIAG:  Chronic left shoulder pain  Muscle weakness (generalized)  Rationale for Evaluation and Treatment: Rehabilitation  ONSET DATE: 01/08/2022  SUBJECTIVE:                                                                                                                                                                                      SUBJECTIVE STATEMENT: Pt reports was sore after last  appointment but otherwise ok. Pt reports no other updates.   PERTINENT HISTORY: Patient is a 77 year old male with chronic history of left shoulder pain since [place] but progressively worse over past 2-3 months. He report intermittent left shoulder pain with active movement and stiffness some at rest. Patient was most recently discharged last month from outpatient PT for gait/balance issues.   PAIN:  taken at eval Are you having pain? Yes: NPRS scale: 8/10 Pain location: Left  shoulder  Pain description: intermittent ache sometimes at rest/sharp with movement Aggravating factors: overhead reaching; reaching cross body Relieving factors: rest- have not really tried heat/ice and I havent taken any pain meds  PRECAUTIONS: Fall  WEIGHT BEARING RESTRICTIONS: No  FALLS:  Has patient fallen in last 6 months? No  LIVING ENVIRONMENT: Lives with: lives with their spouse Lives in: House/apartment Stairs: Yes: External: 4 steps; can reach both Has following equipment at home: Single point cane and Walker - 2 wheeled  OCCUPATION: Retired   PLOF: Independent  PATIENT GOALS: learn what to do to get over this pain in my shoulder   NEXT MD VISIT: Dr. Rudene Christians- sometime in March per patient report  OBJECTIVE:   DIAGNOSTIC FINDINGS:  02/05/2022 12:26 PM EST  AP and scapular Y x-rays of the left shoulder were ordered and personally reviewed today. These show glenohumeral degenerative changes at the inferior glenoid and inferior humeral head. Scapular Y view shows normal subacromial space and no deformity. Minimal AC arthritis.  X-ray Impression Moderate glenohumeral osteoarthritis, left shoulder.     Imaging Results - X-ray shoulder complete left minimum 2 views (02/05/2022 8:47 AM EST) Authorizing Provider Result Type  Lauris Poag MD IMG DIAGNOSTIC IMAGING ORDERABLES     PATIENT SURVEYS:  Quick Dash 20.5 and FOTO  55  COGNITION: Overall cognitive status: Within functional limits for tasks assessed     SENSATION: WFL  POSTURE: Forward head Protracted shoulders bilaterally Very stiff cervical region and left shoulder  UPPER EXTREMITY ROM:   Active ROM Right eval Left eval  Shoulder flexion 160 *135  Shoulder extension  *62  Shoulder abduction 162 *130  Shoulder adduction    Shoulder internal rotation  *58  Shoulder external rotation  *49  Elbow flexion  WNL  Elbow extension  WNL  Wrist flexion    Wrist extension    Wrist ulnar deviation    Wrist radial deviation    Wrist pronation    Wrist supination    (Blank rows = not tested) *= pain and empty end feel today  UPPER EXTREMITY MMT:  MMT Right eval Left eval  Shoulder flexion 5 4/5 within available ROM  Shoulder extension 5 4  Shoulder abduction 5 4  Shoulder adduction 5 4  Shoulder internal rotation 5 4  Shoulder external rotation 5 4  Middle trapezius    Lower trapezius    Elbow flexion 5 4  Elbow extension 5 4  Wrist flexion    Wrist extension    Wrist ulnar deviation    Wrist radial deviation    Wrist pronation    Wrist supination    Grip strength (lbs)    (Blank rows = not tested)  SHOULDER SPECIAL TESTS: Impingement tests: Neer impingement test: positive , Hawkins/Kennedy impingement test: positive , and Painful arc test: negative Instability tests: Sulcus sign: negative Rotator cuff assessment: Drop arm test: negative and Full can test: negative Biceps assessment: Yergason's test: negative and Speed's test: positive   JOINT MOBILITY TESTING:  Hypomobile GH joint with inf/PA/AP mobilty  PALPATION:  (+) tenderness superior shoulder with some anterior pain with end range motion   TODAY'S TREATMENT:  DATE:   TherEx: UBE bike level 5 x 5 min(2 min 30 sec FWD/2 min 30  sec BCKWD) - unbilled  On plinth: Body blade LUE only 2x 60 sec. Pt rates intervention as easy.    Snow angels 15x BUE pain-free  8# dumbbell serratus punch 15x LUE   Prone on plinth: 2 rounds Is 15x bilat Ts 15x bilat (modified for decreased abduction as pt feeling pain in full position) Still unable to achieve W or Y positioning due to pain felt in back of shoulder, does improve with some towel support under L pec/anterior shoulder  Prone LUE shoulder ER 15x AROM Prone LUE YTB shoulder ER 2x15, RTB 2x15 pain-free  Elastogel donned L shoulder (no adverse reaction, skin WNL): Seated shoulder rolls cw/cc 10x, shrugs 10x, scap retractions 10x bilateral   Manual: pt prone Assessment C5-T6 very TTP throughout mid and upper thoracic spine, reports pain can feel sharp in nature does not improve with grade I-II PA mobs.   PATIENT EDUCATION: Education details: Pt educated throughout session about proper posture and technique with exercises. Improved exercise technique, movement at target joints, use of target muscles after min to mod verbal, visual, tactile cues.  Person educated: Patient Education method: Explanation, Demonstration, Tactile cues, and Verbal cues Education comprehension: verbalized understanding, returned demonstration, verbal cues required, tactile cues required, and needs further education  HOME EXERCISE PROGRAM: Access Code: [email or web address] URL: [email or web address] Date: 03/17/2022 Prepared by: Sande Brothers  Exercises - Supine Shoulder Flexion Extension AAROM with Dowel  - 1 x daily - 7 x weekly - 3 sets - 10 reps - Supine Shoulder Abduction AAROM with Dowel  - 1 x daily - 7 x weekly - 3 sets - 10 reps - Supine Shoulder External Rotation in 45 Degrees Abduction AAROM with Dowel  - 1 x daily - 7 x weekly - 3 sets - 10 reps - Standing Shoulder Internal Rotation Stretch with Towel  - 1 x daily - 7 x weekly - 3 sets - 10 reps - Standing Bilateral  Shoulder Internal Rotation AAROM with Dowel  - 1 x daily - 7 x weekly - 3 sets - 10 reps  ASSESSMENT:  CLINICAL IMPRESSION: Pt continues to progress level of resistance and pain-free ROM with majority of interventions. Pt still pain limited with prone shoulder ER with abduction to 90 deg or greater. He repots feeling this in posterior shoulder. This pain does improve somewhat with towel roll support to anterior shoulder/pec region. The pt will benefit from further skilled PT to improve LUE pain, ROM and mobility to increase QOL and ease with ADLs.  OBJECTIVE IMPAIRMENTS: decreased ROM, decreased strength, hypomobility, increased fascial restrictions, impaired flexibility, impaired UE functional use, and pain.   ACTIVITY LIMITATIONS: carrying, lifting, sleeping, dressing, and reach over head  PARTICIPATION LIMITATIONS: cleaning, laundry, community activity, and yard work  PERSONAL FACTORS:  none  are also affecting patient's functional outcome.   REHAB POTENTIAL: Good  CLINICAL DECISION MAKING: Stable/uncomplicated  EVALUATION COMPLEXITY: Low   GOALS: Goals reviewed with patient? Yes  SHORT TERM GOALS: Target date: 04/28/2022  Pt will be independent with HEP in order to improve strength and decrease pain in order to improve pain-free function at home and work. Baseline: Eval Goal status: INITIAL  2.   Pt will decrease worst pain in left shoulder as reported on NPRS by at least 1 points in order to demonstrate reduction in pain. Baseline: EVAL= 8/10 Goal status: INITIAL   LONG TERM GOALS: Target date:  06/09/2022  Pt will decrease quick DASH score by at least 8% in order to demonstrate clinically significant reduction in disability. Baseline: EVAL=20.5 Goal status: INITIAL  2.  Pt will improve FOTO to target score of 67 to display perceived improvements in ability to complete ADL's.  Baseline: EVAL: 55 Goal status: INITIAL  3.  Pt will decrease worst pain in left shoulder as  reported on NPRS by at least 3 points in order to demonstrate clinically significant reduction in pain. Baseline: EVAL Goal status: INITIAL  4.  Pt will increase strength of  by at least 1/2 MMT grade in order to demonstrate improvement in strength and function  Baseline: EVAL= 4/5 in available range with shoulder flex/abd/ER/IR. Goal status: INITIAL    PLAN:  PT FREQUENCY: 1-2x/week  PT DURATION: 12 weeks  PLANNED INTERVENTIONS: Therapeutic exercises, Therapeutic activity, Neuromuscular re-education, Patient/Family education, Self Care, Joint mobilization, Dry Needling, Electrical stimulation, Spinal manipulation, Spinal mobilization, Cryotherapy, Moist heat, Taping, Ultrasound, and Manual therapy  PLAN FOR NEXT SESSION: Review and progress painfree shoulder ROM; perform manual therapy as appropriate; progress HEP, UE strengthening as appropriate Ricard Dillon PT, DPT  1:58 PM,04/21/22

## 2022-04-24 ENCOUNTER — Ambulatory Visit: Payer: Medicare Other

## 2022-04-24 ENCOUNTER — Ambulatory Visit: Payer: Medicare PPO

## 2022-04-24 DIAGNOSIS — G8929 Other chronic pain: Secondary | ICD-10-CM

## 2022-04-24 DIAGNOSIS — M6281 Muscle weakness (generalized): Secondary | ICD-10-CM

## 2022-04-24 DIAGNOSIS — M25512 Pain in left shoulder: Secondary | ICD-10-CM | POA: Diagnosis not present

## 2022-04-24 NOTE — Therapy (Signed)
OUTPATIENT PHYSICAL THERAPY SHOULDER TREATMENT NOTE/Physical Therapy Progress Note   Dates of reporting period  03/17/2022   to   04/24/2022    Patient Name: Nicholas [name] MRN: [id number] DOB:08/23/1945, 77 y.o., male Today's Date: 04/24/2022  END OF SESSION:  PT End of Session - 04/24/22 1032     Visit Number 10    Number of Visits 24    Date for PT Re-Evaluation 06/09/22    Authorization Type [employer] PPO    Authorization Time Period 03/17/22-06/09/22    Progress Note Due on Visit 10    PT Start Time 1017    PT Stop Time 1100    PT Time Calculation (min) 43 min    Activity Tolerance Patient tolerated treatment well;No increased pain    Behavior During Therapy [facility] for tasks assessed/performed                  Past Medical History:  Diagnosis Date   A-fib ([place])    Cancer ([place]) 10/25/2019   Prostate   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   Dysrhythmia    Pulmonary embolism ([facility])    Skin cancer    TIA (transient ischemic attack)    Past Surgical History:  Procedure Laterality Date   APPENDECTOMY     COLONOSCOPY WITH PROPOFOL N/A 11/23/2019   Procedure: COLONOSCOPY WITH PROPOFOL;  Surgeon: Toledo, Benay Pike, MD;  Location: [facility] ENDOSCOPY;  Service: Gastroenterology;  Laterality: N/A;   cyber knife surgery     PROSTATE SURGERY     Patient Active Problem List   Diagnosis Date Noted   COVID-19 virus infection 02/17/2021   A-fib ([place])    TIA (transient ischemic attack)    Lactic acidosis    Hyponatremia    Hypotension    Generalized weakness     PCP: Dr. Emily Filbert  REFERRING PROVIDER: Dr. Hessie Knows  REFERRING DIAG: OA L Shoulder   THERAPY DIAG:  Chronic left shoulder pain  Muscle weakness (generalized)  Rationale for Evaluation and Treatment: Rehabilitation  ONSET DATE: 01/08/2022  SUBJECTIVE:                                                                                                                                                                                       SUBJECTIVE STATEMENT: Pt reports he was sore after last appointment but otherwise ok. Pt reports no other updates.   PERTINENT HISTORY: Patient is a 77 year old male with chronic history of left shoulder pain since [place] but progressively worse over past 2-3 months. He report intermittent left shoulder pain with active movement and  stiffness some at rest. Patient was most recently discharged last month from outpatient PT for gait/balance issues.   PAIN: taken at eval Are you having pain? Yes: NPRS scale: 8/10 Pain location: Left  shoulder  Pain description: intermittent ache sometimes at rest/sharp with movement Aggravating factors: overhead reaching; reaching cross body Relieving factors: rest- have not really tried heat/ice and I havent taken any pain meds  PRECAUTIONS: Fall  WEIGHT BEARING RESTRICTIONS: No  FALLS:  Has patient fallen in last 6 months? No  LIVING ENVIRONMENT: Lives with: lives with their spouse Lives in: House/apartment Stairs: Yes: External: 4 steps; can reach both Has following equipment at home: Single point cane and Walker - 2 wheeled  OCCUPATION: Retired   PLOF: Independent  PATIENT GOALS: learn what to do to get over this pain in my shoulder   NEXT MD VISIT: Dr. Rudene Christians- sometime in March per patient report  OBJECTIVE:   DIAGNOSTIC FINDINGS:  02/05/2022 12:26 PM EST  AP and scapular Y x-rays of the left shoulder were ordered and personally reviewed today. These show glenohumeral degenerative changes at the inferior glenoid and inferior humeral head. Scapular Y view shows normal subacromial space and no deformity. Minimal AC arthritis.  X-ray Impression Moderate glenohumeral osteoarthritis, left shoulder.     Imaging Results - X-ray shoulder complete left minimum 2 views (02/05/2022 8:47 AM EST) Authorizing Provider Result Type  Lauris Poag MD IMG  DIAGNOSTIC IMAGING ORDERABLES     PATIENT SURVEYS:  Quick Dash 20.5 and FOTO 55  COGNITION: Overall cognitive status: Within functional limits for tasks assessed     SENSATION: WFL  POSTURE: Forward head Protracted shoulders bilaterally Very stiff cervical region and left shoulder  UPPER EXTREMITY ROM:   Active ROM Right eval Left eval  Shoulder flexion 160 *135  Shoulder extension  *62  Shoulder abduction 162 *130  Shoulder adduction    Shoulder internal rotation  *58  Shoulder external rotation  *49  Elbow flexion  WNL  Elbow extension  WNL  Wrist flexion    Wrist extension    Wrist ulnar deviation    Wrist radial deviation    Wrist pronation    Wrist supination    (Blank rows = not tested) *= pain and empty end feel today  UPPER EXTREMITY MMT:  MMT Right eval Left eval  Shoulder flexion 5 4/5 within available ROM  Shoulder extension 5 4  Shoulder abduction 5 4  Shoulder adduction 5 4  Shoulder internal rotation 5 4  Shoulder external rotation 5 4  Middle trapezius    Lower trapezius    Elbow flexion 5 4  Elbow extension 5 4  Wrist flexion    Wrist extension    Wrist ulnar deviation    Wrist radial deviation    Wrist pronation    Wrist supination    Grip strength (lbs)    (Blank rows = not tested)  SHOULDER SPECIAL TESTS: Impingement tests: Neer impingement test: positive , Hawkins/Kennedy impingement test: positive , and Painful arc test: negative Instability tests: Sulcus sign: negative Rotator cuff assessment: Drop arm test: negative and Full can test: negative Biceps assessment: Yergason's test: negative and Speed's test: positive   JOINT MOBILITY TESTING:  Hypomobile GH joint with inf/PA/AP mobilty  PALPATION:  (+) tenderness superior shoulder with some anterior pain with end range motion   TODAY'S TREATMENT:  DATE:   TherEx: UBE bike level 4 x 5 min(2 min 30 sec FWD/2 min 30 sec BCKWD) - unbilled  Goals reassessed for progress note (please refer to goal section below for details)  MMT: LUE strength grossly 4+/5, slight pian limitation with abduction. ER weakest  Prone on plinth:  Is 2x15 bilat - reports improved comfort with second set Ts 15x bilat - modified ROM to improve comfort  Supine shoulder ext isometric 3x12-15 x 3-5 sec holds/rep, cuing for decreased intensity, 3 sec count felt comfortable, 5 sec count uncomfortable, instructed pt to focus on 3 sec count  Manually resisted L shoulder ER isometric 2x10. No pain but could feel area of irritation L shoulder. Pt performed second set with addition shoulder ext isometric combined with ER   Instruction in bed mobility to reduce strain L shoulder 1x from supine >seated on plinth  Standing: LUE Shoulder ext isometric against wall with towel roll 1x10 3 sec hold/rep Shoulder ER isometric against wall with towel roll 1x10 with 3 sec hold/rep Shoulder flexion isometric against wall with towel roll 1x10 with 3 sec hold/rep  L shoulder pendulum 10x circles   PATIENT EDUCATION: Education details: Pt educated throughout session about proper posture and technique with exercises. Improved exercise technique, movement at target joints, use of target muscles after min to mod verbal, visual, tactile cues.  Person educated: Patient Education method: Explanation, Demonstration, Tactile cues, and Verbal cues Education comprehension: verbalized understanding, returned demonstration, verbal cues required, tactile cues required, and needs further education  HOME EXERCISE PROGRAM:  Updated 2/15:  Access Code: [email or web address] URL: [email or web address] Date: 04/24/2022 Prepared by: Ricard Dillon  Exercises - Supine Isometric Shoulder Extension with Towel  - 1 x daily - 5 x weekly - 3 sets - 10 reps **corrected on sheet to reflect  correct number of reps (10) - Standing Isometric Shoulder External Rotation with Doorway and Towel Roll  - 1 x daily - 5 x weekly - 3 sets - 10 reps   Access Code: [email or web address] URL: [email or web address] Date: 03/17/2022 Prepared by: Sande Brothers  Exercises - Supine Shoulder Flexion Extension AAROM with Dowel  - 1 x daily - 7 x weekly - 3 sets - 10 reps - Supine Shoulder Abduction AAROM with Dowel  - 1 x daily - 7 x weekly - 3 sets - 10 reps - Supine Shoulder External Rotation in 45 Degrees Abduction AAROM with Dowel  - 1 x daily - 7 x weekly - 3 sets - 10 reps - Standing Shoulder Internal Rotation Stretch with Towel  - 1 x daily - 7 x weekly - 3 sets - 10 reps - Standing Bilateral Shoulder Internal Rotation AAROM with Dowel  - 1 x daily - 7 x weekly - 3 sets - 10 reps  ASSESSMENT:  CLINICAL IMPRESSION: Goals reassessed for progress note. Pt has met MMT goal, short term pain goal with worst pain decrease, and recently met FOTO goal previous session. This indicates improvements in strength, pain and perceived functional mobility. Pt also progressing on long-term pain goal. While pt shows progress, pt with no change on Quick Dash. Pt remaining pain is recreated with L shoulder extension and occ ER. Will continue to address these deficits. The pt will benefit from further skilled PT to improve LUE pain, ROM and mobility to increase QOL and ease with ADLs.  OBJECTIVE IMPAIRMENTS: decreased ROM, decreased strength, hypomobility, increased fascial restrictions, impaired flexibility, impaired UE functional use, and pain.   ACTIVITY LIMITATIONS: carrying, lifting, sleeping,  dressing, and reach over head  PARTICIPATION LIMITATIONS: cleaning, laundry, community activity, and yard work  PERSONAL FACTORS:  none  are also affecting patient's functional outcome.   REHAB POTENTIAL: Good  CLINICAL DECISION MAKING: Stable/uncomplicated  EVALUATION COMPLEXITY: Low   GOALS: Goals  reviewed with patient? Yes  SHORT TERM GOALS: Target date: 04/28/2022  Pt will be independent with HEP in order to improve strength and decrease pain in order to improve pain-free function at home and work. Baseline: Eval; updated Goal status: IN PROGRESS  2.   Pt will decrease worst pain in left shoulder as reported on NPRS by at least 1 points in order to demonstrate reduction in pain. Baseline: EVAL= 8/10; 2/15: can reach up to 6-7/10 with reaching behind his back Goal status: MET   LONG TERM GOALS: Target date: 06/09/2022  Pt will decrease quick DASH score by at least 8% in order to demonstrate clinically significant reduction in disability. Baseline: EVAL=20.5; 2/15: 20.5 Goal status: IN PROGRESS  2.  Pt will improve FOTO to target score of 67 to display perceived improvements in ability to complete ADL's.  Baseline: EVAL: 55; 2/15: 75  Goal status: MET  3.  Pt will decrease worst pain in left shoulder as reported on NPRS by at least 3 points in order to demonstrate clinically significant reduction in pain. Baseline: EVAL; 2/15: can reach up to 6-7/10 with reaching behind his back Goal status: IN PROGRESS  4.  Pt will increase strength of  by at least 1/2 MMT grade in order to demonstrate improvement in strength and function  Baseline: EVAL= 4/5 in available range with shoulder flex/abd/ER/IR.; 2/15: LUE shoulder strength grossly 4+/5, still some weakness in ER, RUE grossly 5/5 Goal status: MET    PLAN:  PT FREQUENCY: 1-2x/week  PT DURATION: 12 weeks  PLANNED INTERVENTIONS: Therapeutic exercises, Therapeutic activity, Neuromuscular re-education, Patient/Family education, Self Care, Joint mobilization, Dry Needling, Electrical stimulation, Spinal manipulation, Spinal mobilization, Cryotherapy, Moist heat, Taping, Ultrasound, and Manual therapy  PLAN FOR NEXT SESSION: Review and progress painfree shoulder ROM; perform manual therapy as appropriate; progress HEP, UE  strengthening as appropriate Ricard Dillon PT, DPT  1:26 PM,04/24/22

## 2022-04-28 ENCOUNTER — Ambulatory Visit: Payer: Medicare PPO

## 2022-04-28 DIAGNOSIS — M25512 Pain in left shoulder: Secondary | ICD-10-CM | POA: Diagnosis not present

## 2022-04-28 DIAGNOSIS — G8929 Other chronic pain: Secondary | ICD-10-CM

## 2022-04-28 DIAGNOSIS — M6281 Muscle weakness (generalized): Secondary | ICD-10-CM

## 2022-04-28 NOTE — Therapy (Signed)
OUTPATIENT PHYSICAL THERAPY SHOULDER TREATMENT NOTE    Patient Name: Nicholas [name] MRN: [id number] DOB:06-20-1945, 77 y.o., male Today's Date: 04/28/2022  END OF SESSION:  PT End of Session - 04/28/22 1401     Visit Number 11    Number of Visits 24    Date for PT Re-Evaluation 06/09/22    Authorization Type [employer] PPO    Authorization Time Period 03/17/22-06/09/22    Progress Note Due on Visit 10    PT Start Time 1145    PT Stop Time 1231    PT Time Calculation (min) 46 min    Equipment Utilized During Treatment Gait belt    Activity Tolerance Patient tolerated treatment well;No increased pain    Behavior During Therapy [facility] for tasks assessed/performed                   Past Medical History:  Diagnosis Date   A-fib ([place])    Cancer ([place]) 10/25/2019   Prostate   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   Dysrhythmia    Pulmonary embolism (HCC)    Skin cancer    TIA (transient ischemic attack)    Past Surgical History:  Procedure Laterality Date   APPENDECTOMY     COLONOSCOPY WITH PROPOFOL N/A 11/23/2019   Procedure: COLONOSCOPY WITH PROPOFOL;  Surgeon: Toledo, Benay Pike, MD;  Location: [facility] ENDOSCOPY;  Service: Gastroenterology;  Laterality: N/A;   cyber knife surgery     PROSTATE SURGERY     Patient Active Problem List   Diagnosis Date Noted   COVID-19 virus infection 02/17/2021   A-fib (Nicholas [name])    TIA (transient ischemic attack)    Lactic acidosis    Hyponatremia    Hypotension    Generalized weakness     PCP: Nicholas [name]  REFERRING PROVIDER: Dr. Hessie [name]  REFERRING DIAG: OA L Shoulder   THERAPY DIAG:  Chronic left shoulder pain  Muscle weakness (generalized)  Rationale for Evaluation and Treatment: Rehabilitation  ONSET DATE: 01/08/2022  SUBJECTIVE:                                                                                                                                                                                       SUBJECTIVE STATEMENT: Pt reports feeling somewhat ill last week, but recovered in a couple days. He reports he didn't perform HEP as much as he'd like.   PERTINENT HISTORY: Patient is a 77 year old male with chronic history of left shoulder pain since [place] but progressively worse over past 2-3 months. He report intermittent left shoulder pain with active movement  and stiffness some at rest. Patient was most recently discharged last month from outpatient PT for gait/balance issues.   PAIN: taken at eval Are you having pain? Yes: NPRS scale: 8/10 Pain location: Left  shoulder  Pain description: intermittent ache sometimes at rest/sharp with movement Aggravating factors: overhead reaching; reaching cross body Relieving factors: rest- have not really tried heat/ice and I havent taken any pain meds  PRECAUTIONS: Fall  WEIGHT BEARING RESTRICTIONS: No  FALLS:  Has patient fallen in last 6 months? No  LIVING ENVIRONMENT: Lives with: lives with their spouse Lives in: House/apartment Stairs: Yes: External: 4 steps; can reach both Has following equipment at home: Single point cane and Walker - 2 wheeled  OCCUPATION: Retired   PLOF: Independent  PATIENT GOALS: learn what to do to get over this pain in my shoulder   NEXT MD VISIT: Nicholas [name]- sometime in March per patient report  OBJECTIVE:   DIAGNOSTIC FINDINGS:  02/05/2022 12:26 PM EST  AP and scapular Y x-rays of the left shoulder were ordered and personally reviewed today. These show glenohumeral degenerative changes at the inferior glenoid and inferior humeral head. Scapular Y view shows normal subacromial space and no deformity. Minimal AC arthritis.  X-ray Impression Moderate glenohumeral osteoarthritis, left shoulder.     Imaging Results - X-ray shoulder complete left minimum 2 views (02/05/2022 8:47 AM EST) Authorizing Provider Result Type  Nicholas Poag MD  IMG DIAGNOSTIC IMAGING ORDERABLES     PATIENT SURVEYS:  Quick Dash 20.5 and FOTO 55  COGNITION: Overall cognitive status: Within functional limits for tasks assessed     SENSATION: WFL  POSTURE: Forward head Protracted shoulders bilaterally Very stiff cervical region and left shoulder  UPPER EXTREMITY ROM:   Active ROM Right eval Left eval  Shoulder flexion 160 *135  Shoulder extension  *62  Shoulder abduction 162 *130  Shoulder adduction    Shoulder internal rotation  *58  Shoulder external rotation  *49  Elbow flexion  WNL  Elbow extension  WNL  Wrist flexion    Wrist extension    Wrist ulnar deviation    Wrist radial deviation    Wrist pronation    Wrist supination    (Blank rows = not tested) *= pain and empty end feel today  UPPER EXTREMITY MMT:  MMT Right eval Left eval  Shoulder flexion 5 4/5 within available ROM  Shoulder extension 5 4  Shoulder abduction 5 4  Shoulder adduction 5 4  Shoulder internal rotation 5 4  Shoulder external rotation 5 4  Middle trapezius    Lower trapezius    Elbow flexion 5 4  Elbow extension 5 4  Wrist flexion    Wrist extension    Wrist ulnar deviation    Wrist radial deviation    Wrist pronation    Wrist supination    Grip strength (lbs)    (Blank rows = not tested)  SHOULDER SPECIAL TESTS: Impingement tests: Neer impingement test: positive , Hawkins/Kennedy impingement test: positive , and Painful arc test: negative Instability tests: Sulcus sign: negative Rotator cuff assessment: Drop arm test: negative and Full can test: negative Biceps assessment: Yergason's test: negative and Speed's test: positive   JOINT MOBILITY TESTING:  Hypomobile GH joint with inf/PA/AP mobilty  PALPATION:  (+) tenderness superior shoulder with some anterior pain with end range motion   TODAY'S TREATMENT:  DATE:   TherEx: UBE bike level 5 x 5 min(2 min 30 sec FWD/2 min 30 sec BCKWD) - unbilled  Supine on plinth: BLE supported by bolster Body blade 2x60 sec. Pt rates medium  Serratus punch 7# dumbbell LUE 2x13   GTB horizontal adduction 3x13 LUE only  Supine L shoulder ext isometric 2x13 with 3 sec hold/rep   Supine L shoulder ER with YTB 2x12  Prone on plinth: towel roll placed under anterior L shoulder Is 1x15, 1x20 bilat. Rates easy  Ts 1x15, 1x20 bilat  No pain today  Standing: LUE Shoulder ext isometric against wall with towel roll 1x10 3 sec hold/rep Shoulder ER isometric against wall with towel roll 1x10 with 3 sec hold/rep Shoulder IR isometric against wall with towel roll 1x10 with 3 sec hold/rep Shoulder flexion isometric against wall with towel roll 1x10 with 3 sec hold/rep  Standing Y lift-offs at wall 10x bilat, no pain Seated ROWs 12.5, 17.5, 22.5, 27.5# 10x reps at each weight BUE   L shoulder pendulum 10x circles   PATIENT EDUCATION: Education details: Pt educated throughout session about proper posture and technique with exercises. Improved exercise technique, movement at target joints, use of target muscles after min to mod verbal, visual, tactile cues.  Person educated: Patient Education method: Explanation, Demonstration, Tactile cues, and Verbal cues Education comprehension: verbalized understanding, returned demonstration, verbal cues required, tactile cues required, and needs further education  HOME EXERCISE PROGRAM:  Updated 2/15:  Access Code: [email or web address] URL: [email or web address] Date: 04/24/2022 Prepared by: Ricard Dillon  Exercises - Supine Isometric Shoulder Extension with Towel  - 1 x daily - 5 x weekly - 3 sets - 10 reps **corrected on sheet to reflect correct number of reps (10) - Standing Isometric Shoulder External Rotation with Doorway and Towel Roll  - 1 x daily - 5 x weekly - 3 sets - 10 reps   Access Code:  [email or web address] URL: [email or web address] Date: 03/17/2022 Prepared by: Sande Brothers  Exercises - Supine Shoulder Flexion Extension AAROM with Dowel  - 1 x daily - 7 x weekly - 3 sets - 10 reps - Supine Shoulder Abduction AAROM with Dowel  - 1 x daily - 7 x weekly - 3 sets - 10 reps - Supine Shoulder External Rotation in 45 Degrees Abduction AAROM with Dowel  - 1 x daily - 7 x weekly - 3 sets - 10 reps - Standing Shoulder Internal Rotation Stretch with Towel  - 1 x daily - 7 x weekly - 3 sets - 10 reps - Standing Bilateral Shoulder Internal Rotation AAROM with Dowel  - 1 x daily - 7 x weekly - 3 sets - 10 reps  ASSESSMENT:  CLINICAL IMPRESSION: Pt able to perform standing shoulder Ys/lift-offs today at wall without pain in L shoulder. Pt previously has been pain-limited with Y positioning of L shoulder when trying to perform this exercise in prone position on plinth, likely worsened by LUE weakness with this movement in more challenging position. The pt will benefit from further skilled PT to improve LUE pain, ROM and mobility to increase QOL and ease with ADLs.  OBJECTIVE IMPAIRMENTS: decreased ROM, decreased strength, hypomobility, increased fascial restrictions, impaired flexibility, impaired UE functional use, and pain.   ACTIVITY LIMITATIONS: carrying, lifting, sleeping, dressing, and reach over head  PARTICIPATION LIMITATIONS: cleaning, laundry, community activity, and yard work  PERSONAL FACTORS:  none  are also affecting patient's functional outcome.   REHAB POTENTIAL: Good  CLINICAL DECISION MAKING: Stable/uncomplicated  EVALUATION  COMPLEXITY: Low   GOALS: Goals reviewed with patient? Yes  SHORT TERM GOALS: Target date: 04/28/2022  Pt will be independent with HEP in order to improve strength and decrease pain in order to improve pain-free function at home and work. Baseline: Eval; updated Goal status: IN PROGRESS  2.   Pt will decrease worst pain in left  shoulder as reported on NPRS by at least 1 points in order to demonstrate reduction in pain. Baseline: EVAL= 8/10; 2/15: can reach up to 6-7/10 with reaching behind his back Goal status: MET   LONG TERM GOALS: Target date: 06/09/2022  Pt will decrease quick DASH score by at least 8% in order to demonstrate clinically significant reduction in disability. Baseline: EVAL=20.5; 2/15: 20.5 Goal status: IN PROGRESS  2.  Pt will improve FOTO to target score of 67 to display perceived improvements in ability to complete ADL's.  Baseline: EVAL: 55; 2/15: 75  Goal status: MET  3.  Pt will decrease worst pain in left shoulder as reported on NPRS by at least 3 points in order to demonstrate clinically significant reduction in pain. Baseline: EVAL; 2/15: can reach up to 6-7/10 with reaching behind his back Goal status: IN PROGRESS  4.  Pt will increase strength of  by at least 1/2 MMT grade in order to demonstrate improvement in strength and function  Baseline: EVAL= 4/5 in available range with shoulder flex/abd/ER/IR.; 2/15: LUE shoulder strength grossly 4+/5, still some weakness in ER, RUE grossly 5/5 Goal status: MET    PLAN:  PT FREQUENCY: 1-2x/week  PT DURATION: 12 weeks  PLANNED INTERVENTIONS: Therapeutic exercises, Therapeutic activity, Neuromuscular re-education, Patient/Family education, Self Care, Joint mobilization, Dry Needling, Electrical stimulation, Spinal manipulation, Spinal mobilization, Cryotherapy, Moist heat, Taping, Ultrasound, and Manual therapy  PLAN FOR NEXT SESSION: Review and progress painfree shoulder ROM; perform manual therapy as appropriate; progress HEP, UE strengthening as appropriate Ricard Dillon PT, DPT  2:08 PM,04/28/22

## 2022-05-01 ENCOUNTER — Ambulatory Visit: Payer: Medicare Other

## 2022-05-01 ENCOUNTER — Ambulatory Visit: Payer: Medicare PPO

## 2022-05-01 DIAGNOSIS — M25512 Pain in left shoulder: Secondary | ICD-10-CM | POA: Diagnosis not present

## 2022-05-01 DIAGNOSIS — G8929 Other chronic pain: Secondary | ICD-10-CM

## 2022-05-01 DIAGNOSIS — M25619 Stiffness of unspecified shoulder, not elsewhere classified: Secondary | ICD-10-CM

## 2022-05-01 DIAGNOSIS — M6281 Muscle weakness (generalized): Secondary | ICD-10-CM

## 2022-05-01 NOTE — Therapy (Signed)
OUTPATIENT PHYSICAL THERAPY TREATMENT    Patient Name: Nicholas [name] MRN: [id number] DOB:06/13/1945, 77 y.o., male Today's Date: 05/01/2022  END OF SESSION:  PT End of Session - 05/01/22 1025     Visit Number 12    Number of Visits 24    Date for PT Re-Evaluation 06/09/22    Authorization Type [employer] PPO    Authorization Time Period 03/17/22-06/09/22    Progress Note Due on Visit 20    PT Start Time 1020    PT Stop Time 1058    PT Time Calculation (min) 38 min    Activity Tolerance Patient tolerated treatment well;No increased pain    Behavior During Therapy [facility] for tasks assessed/performed                   Past Medical History:  Diagnosis Date   A-fib ([place])    Cancer ([place]) 10/25/2019   Prostate   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   Dysrhythmia    Pulmonary embolism (H[facility])    Skin cancer    TIA (transient ischemic attack)    Past Surgical History:  Procedure Laterality Date   APPENDECTOMY     COLONOSCOPY WITH PROPOFOL N/A 11/23/2019   Procedure: COLONOSCOPY WITH PROPOFOL;  Surgeon: Toledo, Benay Pike, MD;  Location: [facility] ENDOSCOPY;  Service: Gastroenterology;  Laterality: N/A;   cyber knife surgery     PROSTATE SURGERY     Patient Active Problem List   Diagnosis Date Noted   COVID-19 virus infection 02/17/2021   A-fib ([place])    TIA (transient ischemic attack)    Lactic acidosis    Hyponatremia    Hypotension    Generalized weakness     PCP: Dr. Emily Filbert  REFERRING PROVIDER: Dr. Hessie Knows  REFERRING DIAG: OA L Shoulder   THERAPY DIAG:  Chronic left shoulder pain  Muscle weakness (generalized)  Limited range of motion (ROM) of shoulder  Rationale for Evaluation and Treatment: Rehabilitation  ONSET DATE: 01/08/2022  SUBJECTIVE:                                                                                                                                                                                      SUBJECTIVE  STATEMENT: Feeling well. HEP not consistent.Minimal pain on arrival. Knees remain stiff.    PERTINENT HISTORY: Patient is a 77 year old male with chronic history of left shoulder pain since [place] but progressively worse over past 2-3 months. He report intermittent left shoulder pain with active movement and stiffness some at rest. Patient was most recently discharged last month from outpatient PT  for gait/balance issues.   PAIN: Pain in Left should 0.5/10, been feeling goo.   PRECAUTIONS: Fall  WEIGHT BEARING RESTRICTIONS: No  FALLS:  Has patient fallen in last 6 months? No  PATIENT GOALS: learn what to do to get over this pain in my shoulder   NEXT MD VISIT: Dr. Rudene Christians- sometime in March per patient report   INTERVENTION THIS DATE 05/01/22  BUE warmup AA/ROM on UBE x4 minutes Seated cable ROW:  27.5# x25; 37.5  Standing pushup pff treadmill bar 1x15 depth to tolerance, cues for form   Seated cable ROW:  37.5# x12 Standing pushup pff treadmill bar 1x15  Standing Blue TB ER 1x12 Standing Blue TB extension from 90 degrees 1x12 (paired with thoracic extension, scapular retraction)  Seated overhead Y with red physioball 1x12, cues for scap involvement  Standing Blue TB ER 1x12 Standing Blue TB extension from 90 degrees 1x12 (paired with thoracic extension, scapular retraction)  Seated overhead Y with red physioball 1x12, cues for scap involvement   STANDING SHOULDER abduction 1x10 5lb FW Standing shoulder flexion 1x10 4lb FW    PATIENT EDUCATION: Attending to prescipbed range, resistance, and reps given in session, not going beyond these for fun.    HOME EXERCISE PROGRAM:  Updated 2/15:  Access Code: [email or web address] URL: [email or web address] Date: 04/24/2022 Prepared by: Ricard Dillon  Exercises - Supine Isometric Shoulder Extension with Towel  - 1 x daily - 5 x weekly - 3 sets - 10 reps **corrected on sheet to  reflect correct number of reps (10) - Standing Isometric Shoulder External Rotation with Doorway and Towel Roll  - 1 x daily - 5 x weekly - 3 sets - 10 reps   Access Code: [email or web address] URL: [email or web address] Date: 03/17/2022 Prepared by: Sande Brothers  Exercises - Supine Shoulder Flexion Extension AAROM with Dowel  - 1 x daily - 7 x weekly - 3 sets - 10 reps - Supine Shoulder Abduction AAROM with Dowel  - 1 x daily - 7 x weekly - 3 sets - 10 reps - Supine Shoulder External Rotation in 45 Degrees Abduction AAROM with Dowel  - 1 x daily - 7 x weekly - 3 sets - 10 reps - Standing Shoulder Internal Rotation Stretch with Towel  - 1 x daily - 7 x weekly - 3 sets - 10 reps - Standing Bilateral Shoulder Internal Rotation AAROM with Dowel  - 1 x daily - 7 x weekly - 3 sets - 10 reps  ASSESSMENT:  CLINICAL IMPRESSION: Continued with moderate loading resistance training to further progress strength building and tissue tolerance. Pt still limited at end-range overhead movements, provoking pain. Pt c/o stiff kness in session, regularly unable to balance upon standing without a period of unsteadiness and need for using fixed objects nearby, should probably be using a device at this point for optimal safety. Strength and symptoms continue to improve dramatically.   OBJECTIVE IMPAIRMENTS: decreased ROM, decreased strength, hypomobility, increased fascial restrictions, impaired flexibility, impaired UE functional use, and pain.   ACTIVITY LIMITATIONS: carrying, lifting, sleeping, dressing, and reach over head  PARTICIPATION LIMITATIONS: cleaning, laundry, community activity, and yard work  PERSONAL FACTORS:  none  are also affecting patient's functional outcome.   REHAB POTENTIAL: Good  CLINICAL DECISION MAKING: Stable/uncomplicated  EVALUATION COMPLEXITY: Low   GOALS: Goals reviewed with patient? Yes  SHORT TERM GOALS: Target date: 04/28/2022  Pt will be independent with HEP  in order to improve strength and decrease pain in order to improve pain-free  function at home and work. Baseline: Eval; updated Goal status: IN PROGRESS  2.   Pt will decrease worst pain in left shoulder as reported on NPRS by at least 1 points in order to demonstrate reduction in pain. Baseline: EVAL= 8/10; 2/15: can reach up to 6-7/10 with reaching behind his back Goal status: MET   LONG TERM GOALS: Target date: 06/09/2022  Pt will decrease quick DASH score by at least 8% in order to demonstrate clinically significant reduction in disability. Baseline: EVAL=20.5; 2/15: 20.5 Goal status: IN PROGRESS  2.  Pt will improve FOTO to target score of 67 to display perceived improvements in ability to complete ADL's.  Baseline: EVAL: 55; 2/15: 75  Goal status: MET  3.  Pt will decrease worst pain in left shoulder as reported on NPRS by at least 3 points in order to demonstrate clinically significant reduction in pain. Baseline: EVAL; 2/15: can reach up to 6-7/10 with reaching behind his back Goal status: IN PROGRESS  4.  Pt will increase strength of  by at least 1/2 MMT grade in order to demonstrate improvement in strength and function  Baseline: EVAL= 4/5 in available range with shoulder flex/abd/ER/IR.; 2/15: LUE shoulder strength grossly 4+/5, still some weakness in ER, RUE grossly 5/5 Goal status: MET    PLAN:  PT FREQUENCY: 1-2x/week  PT DURATION: 12 weeks  PLANNED INTERVENTIONS: Therapeutic exercises, Therapeutic activity, Neuromuscular re-education, Patient/Family education, Self Care, Joint mobilization, Dry Needling, Electrical stimulation, Spinal manipulation, Spinal mobilization, Cryotherapy, Moist heat, Taping, Ultrasound, and Manual therapy  PLAN FOR NEXT SESSION: Review and progress painfree shoulder ROM; perform manual therapy as appropriate; progress HEP, UE strengthening as appropriate   10:32 AM,05/01/22  10:34 AM, 05/01/22 [name], PT, DPT Physical  Therapist - [place] [phone]

## 2022-05-05 ENCOUNTER — Ambulatory Visit: Payer: Medicare PPO

## 2022-05-05 DIAGNOSIS — R42 Dizziness and giddiness: Secondary | ICD-10-CM

## 2022-05-05 DIAGNOSIS — G8929 Other chronic pain: Secondary | ICD-10-CM

## 2022-05-05 DIAGNOSIS — M6281 Muscle weakness (generalized): Secondary | ICD-10-CM

## 2022-05-05 DIAGNOSIS — M25512 Pain in left shoulder: Secondary | ICD-10-CM | POA: Diagnosis not present

## 2022-05-05 NOTE — Therapy (Signed)
OUTPATIENT PHYSICAL THERAPY TREATMENT/RECERT    Patient Name: Nicholas [name] MRN: [id number] DOB:1945/07/22, 77 y.o., male Today's Date: 05/05/2022  END OF SESSION:  PT End of Session - 05/05/22 1356     Visit Number 13    Number of Visits 23    Date for PT Re-Evaluation 06/09/22    Authorization Type [employer] PPO    Authorization Time Period 03/17/22-06/09/22    Progress Note Due on Visit 20    PT Start Time 1147    PT Stop Time 1228    PT Time Calculation (min) 41 min    Activity Tolerance Patient tolerated treatment well    Behavior During Therapy [facility] for tasks assessed/performed                   Past Medical History:  Diagnosis Date   A-fib ([place])    Cancer ([place]) 10/25/2019   Prostate   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   Dysrhythmia    Pulmonary embolism ([facility])    Skin cancer    TIA (transient ischemic attack)    Past Surgical History:  Procedure Laterality Date   APPENDECTOMY     COLONOSCOPY WITH PROPOFOL N/A 11/23/2019   Procedure: COLONOSCOPY WITH PROPOFOL;  Surgeon: Toledo, Benay Pike, MD;  Location: [facility] ENDOSCOPY;  Service: Gastroenterology;  Laterality: N/A;   cyber knife surgery     PROSTATE SURGERY     Patient Active Problem List   Diagnosis Date Noted   COVID-19 virus infection 02/17/2021   A-fib ([place])    TIA (transient ischemic attack)    Lactic acidosis    Hyponatremia    Hypotension    Generalized weakness     PCP: Dr. Emily Filbert  REFERRING PROVIDER: Dr. Hessie Knows  REFERRING DIAG: OA L Shoulder   THERAPY DIAG:  Chronic left shoulder pain  Muscle weakness (generalized)  Dizziness and giddiness  Rationale for Evaluation and Treatment: Rehabilitation  ONSET DATE: 01/08/2022  SUBJECTIVE:                                                                                                                                                                                      SUBJECTIVE STATEMENT: Pt reports no pain in  shoulder currently. Pt reports he slept 10 hours last night, but does not feel well rested. Pt reports reaching behind himself can still be painful, but no other pain. He did try using a towel under his shoulder one night to sleep, unsure if this helped. Pt reports inconsistent performance of HEP.    PERTINENT HISTORY: Patient is a 77 year old male with chronic history of left shoulder pain since [place] but progressively worse over past 2-3 months. He report intermittent left shoulder pain with active movement and stiffness some at rest. Patient was most recently discharged last month from outpatient PT for gait/balance issues.   PAIN: Pain in Left should 0.5/10, been feeling goo.   PRECAUTIONS: Fall  WEIGHT BEARING RESTRICTIONS: No  FALLS:  Has patient fallen in last 6 months? No  PATIENT GOALS: learn what to do to get over this pain in my shoulder   NEXT MD VISIT: Dr. Rudene Christians- sometime in March per patient report   INTERVENTION THIS DATE 05/05/22  BUE warmup AA/ROM on UBE @ lvl 5.5 x5 min total (2.5 min fwd/ 2.5 min bckwd) - unbilled  On plinth: Horizontal adduction with R tband LUE 2x20 pain improves with reps  Serratus punch 8# dumbbell LUE 20x  Supine LUE isometric shoulder ext into table/towel roll 2x15x 2-3 sec holds/reps. Pt reports no pain  Sidelye L shoulder ER with 4# dumbbell 20x, 15x. Hands-on assist for technique. Pt reports pain-free   Prone on plinth: Is 15x BUE, 21x BUE pain-free Ts 2x21 BUE pain-free Still unable to perform prone Ys due to pain limitation  Standing shoulder L shoulder IR with cane 20x pain-free    PATIENT EDUCATION: Pt educated throughout session about proper posture and technique with exercises. Improved exercise technique, movement at target joints, use of target muscles after min to mod verbal, visual, tactile cues. Instructs in importance of HEP and use of cane    HOME EXERCISE  PROGRAM:  Updated 2/15:  Access Code: [email or web address] URL: [email or web address] Date: 04/24/2022 Prepared by: Ricard Dillon  Exercises - Supine Isometric Shoulder Extension with Towel  - 1 x daily - 5 x weekly - 3 sets - 10 reps **corrected on sheet to reflect correct number of reps (10) - Standing Isometric Shoulder External Rotation with Doorway and Towel Roll  - 1 x daily - 5 x weekly - 3 sets - 10 reps   Access Code: [email or web address] URL: [email or web address] Date: 03/17/2022 Prepared by: Sande Brothers  Exercises - Supine Shoulder Flexion Extension AAROM with Dowel  - 1 x daily - 7 x weekly - 3 sets - 10 reps - Supine Shoulder Abduction AAROM with Dowel  - 1 x daily - 7 x weekly - 3 sets - 10 reps - Supine Shoulder External Rotation in 45 Degrees Abduction AAROM with Dowel  - 1 x daily - 7 x weekly - 3 sets - 10 reps - Standing Shoulder Internal Rotation Stretch with Towel  - 1 x daily - 7 x weekly - 3 sets - 10 reps - Standing Bilateral Shoulder Internal Rotation AAROM with Dowel  - 1 x daily - 7 x weekly - 3 sets - 10 reps  ASSESSMENT:  CLINICAL IMPRESSION: Pt tolerates majority of interventions well without pain. Pt still exhibits pain when attempting prone Ys. Pt has reported he is not consistent with his HEP, PT continues to encourage pt to perform HEP/importance of HEP. Pt reports vertigo with supine>seated during session that lasted seconds. Pt reports this has been occasional, chronic. Due to hx of imbalance and unsteadiness pt would benefit from vestibular screen. PT sending recert to physician to include vestibular screen/CRM to address possible deficit as it increases pt's fall risk. The pt will benefit from further skilled PT to improve LUE pain, strength and mobility.  OBJECTIVE IMPAIRMENTS: decreased ROM, decreased strength, hypomobility, increased fascial restrictions, impaired flexibility, impaired UE functional use,  and pain.   ACTIVITY  LIMITATIONS: carrying, lifting, sleeping, dressing, and reach over head  PARTICIPATION LIMITATIONS: cleaning, laundry, community activity, and yard work  PERSONAL FACTORS:  none  are also affecting patient's functional outcome.   REHAB POTENTIAL: Good  CLINICAL DECISION MAKING: Stable/uncomplicated  EVALUATION COMPLEXITY: Low   GOALS: Goals reviewed with patient? Yes  SHORT TERM GOALS: Target date: 04/28/2022  Pt will be independent with HEP in order to improve strength and decrease pain in order to improve pain-free function at home and work. Baseline: Eval; updated Goal status: IN PROGRESS  2.   Pt will decrease worst pain in left shoulder as reported on NPRS by at least 1 points in order to demonstrate reduction in pain. Baseline: EVAL= 8/10; 2/15: can reach up to 6-7/10 with reaching behind his back Goal status: MET   LONG TERM GOALS: Target date: 06/09/2022  Pt will decrease quick DASH score by at least 8% in order to demonstrate clinically significant reduction in disability. Baseline: EVAL=20.5; 2/15: 20.5 Goal status: IN PROGRESS  2.  Pt will improve FOTO to target score of 67 to display perceived improvements in ability to complete ADL's.  Baseline: EVAL: 55; 2/15: 75  Goal status: MET  3.  Pt will decrease worst pain in left shoulder as reported on NPRS by at least 3 points in order to demonstrate clinically significant reduction in pain. Baseline: EVAL; 2/15: can reach up to 6-7/10 with reaching behind his back Goal status: IN PROGRESS  4.  Pt will increase strength of  by at least 1/2 MMT grade in order to demonstrate improvement in strength and function  Baseline: EVAL= 4/5 in available range with shoulder flex/abd/ER/IR.; 2/15: LUE shoulder strength grossly 4+/5, still some weakness in ER, RUE grossly 5/5 Goal status: MET    PLAN:  PT FREQUENCY: 1-2x/week  PT DURATION: other: 5 weeks  PLANNED INTERVENTIONS: Therapeutic exercises, Therapeutic activity,  Neuromuscular re-education, Patient/Family education, Self Care, Joint mobilization, Vestibular training, Canalith repositioning, Dry Needling, Electrical stimulation, Spinal manipulation, Spinal mobilization, Cryotherapy, Moist heat, Taping, Ultrasound, and Manual therapy  PLAN FOR NEXT SESSION: Review and progress painfree shoulder ROM; perform manual therapy as appropriate; progress HEP, UE strengthening as appropriate, vestibular screen   2:19 PM,05/05/22  2:19 PM, 05/05/22 Ricard Dillon PT, DPT  Physical Therapist - [facility]  Outpatient Physical Therapy- [place] [phone]

## 2022-05-08 ENCOUNTER — Ambulatory Visit: Payer: Medicare Other

## 2022-05-08 ENCOUNTER — Ambulatory Visit: Payer: Medicare PPO

## 2022-05-08 DIAGNOSIS — G8929 Other chronic pain: Secondary | ICD-10-CM

## 2022-05-08 DIAGNOSIS — M25512 Pain in left shoulder: Secondary | ICD-10-CM | POA: Diagnosis not present

## 2022-05-08 DIAGNOSIS — M6281 Muscle weakness (generalized): Secondary | ICD-10-CM

## 2022-05-08 NOTE — Therapy (Signed)
OUTPATIENT PHYSICAL THERAPY TREATMENT    Patient Name: Nicholas [name] MRN: [id number] DOB:December 03, 1945, 77 y.o., male Today's Date: 05/08/2022  END OF SESSION:  PT End of Session - 05/08/22 1314     Visit Number 14    Number of Visits 23    Date for PT Re-Evaluation 06/09/22    Authorization Type [employer] PPO    Authorization Time Period 03/17/22-06/09/22    Progress Note Due on Visit 20    PT Start Time 1310    PT Stop Time 1350    PT Time Calculation (min) 40 min    Equipment Utilized During Treatment Gait belt    Activity Tolerance Patient tolerated treatment well    Behavior During Therapy WFL for tasks assessed/performed                    Past Medical History:  Diagnosis Date   A-fib ([place])    Cancer ([place]) 10/25/2019   Prostate   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   Dysrhythmia    Pulmonary embolism ([facility])    Skin cancer    TIA (transient ischemic attack)    Past Surgical History:  Procedure Laterality Date   APPENDECTOMY     COLONOSCOPY WITH PROPOFOL N/A 11/23/2019   Procedure: COLONOSCOPY WITH PROPOFOL;  Surgeon: Toledo, Benay Pike, MD;  Location: [facility] ENDOSCOPY;  Service: Gastroenterology;  Laterality: N/A;   cyber knife surgery     PROSTATE SURGERY     Patient Active Problem List   Diagnosis Date Noted   COVID-19 virus infection 02/17/2021   A-fib ([place])    TIA (transient ischemic attack)    Lactic acidosis    Hyponatremia    Hypotension    Generalized weakness     PCP: Dr. Emily Filbert  REFERRING PROVIDER: Dr. Hessie Knows  REFERRING DIAG: OA L Shoulder   THERAPY DIAG:  Chronic left shoulder pain  Muscle weakness (generalized)  Rationale for Evaluation and Treatment: Rehabilitation  ONSET DATE: 01/08/2022  SUBJECTIVE:                                                                                                                                                                                      SUBJECTIVE STATEMENT: Pt  reports he did add a towel under his shoulder and feels that helped but states for whatever reason that his shoulder is more sore today - rates at a 4/10   PERTINENT HISTORY: Patient is a 77 year old male with chronic history of left shoulder pain since [place] but progressively worse over past 2-3 months. He report intermittent left shoulder  pain with active movement and stiffness some at rest. Patient was most recently discharged last month from outpatient PT for gait/balance issues.   PAIN: Pain in Left shoulder  PRECAUTIONS: Fall  WEIGHT BEARING RESTRICTIONS: No  FALLS:  Has patient fallen in last 6 months? No  PATIENT GOALS: learn what to do to get over this pain in my shoulder   NEXT MD VISIT: Dr. Rudene Christians- sometime in March per patient report   INTERVENTION THIS DATE 05/08/22  *Patient arrived 10 min late  [place]:   On mat:  PROM to left shoulder flex/abd/ER- Pain with overhead ROM > 130 deg Serratus punch 8# dumbbell LUE 2 sets x 10 Standing scaption 2# db each UE- 2 sets x 10 reps- using mirror for feedback to decrease UT compensation Standing abd 2# db each UE - 2 sets x 10 reps- using mirror for feedback to decrease UT compensation  Sidelye L shoulder ER with 4# dumbbell 2 sets of 12 reps. Hands-on assist for technique and towel under arm. Pt reports pain-free   Standing wall push ups with hand wider than shoulders- 2 sets of 10 reps (painfree)  Standing postural strengthening- Scap retraction- 17.5# BUE using matrix cable system- 2 sets x 12 reps Standing postural strengthening- shoulder ext- 12.5# BUE using matrix cable system - 2 sets x 12 reps    PATIENT EDUCATION: Pt educated throughout session about proper posture and technique with exercises. Improved exercise technique, movement at target joints, use of target muscles after min to mod verbal, visual, tactile cues. Instructs in importance of HEP and use of cane     HOME EXERCISE PROGRAM:  Updated 2/15:  Access Code: [email or web address] URL: [email or web address] Date: 04/24/2022 Prepared by: Ricard Dillon  Exercises - Supine Isometric Shoulder Extension with Towel  - 1 x daily - 5 x weekly - 3 sets - 10 reps **corrected on sheet to reflect correct number of reps (10) - Standing Isometric Shoulder External Rotation with Doorway and Towel Roll  - 1 x daily - 5 x weekly - 3 sets - 10 reps   Access Code: [email or web address] URL: [email or web address] Date: 03/17/2022 Prepared by: Sande Brothers  Exercises - Supine Shoulder Flexion Extension AAROM with Dowel  - 1 x daily - 7 x weekly - 3 sets - 10 reps - Supine Shoulder Abduction AAROM with Dowel  - 1 x daily - 7 x weekly - 3 sets - 10 reps - Supine Shoulder External Rotation in 45 Degrees Abduction AAROM with Dowel  - 1 x daily - 7 x weekly - 3 sets - 10 reps - Standing Shoulder Internal Rotation Stretch with Towel  - 1 x daily - 7 x weekly - 3 sets - 10 reps - Standing Bilateral Shoulder Internal Rotation AAROM with Dowel  - 1 x daily - 7 x weekly - 3 sets - 10 reps  ASSESSMENT:  CLINICAL IMPRESSION: Patient presents with good motivation for today's session focusing on ongoing pain limited left shoulder mobility. He continues to present with impingement type left shoulder symptoms - lateral upper arm pain with overhead mobility > 130 deg. He did perform well with dumbbell activities <90 deg and no pain with postural strengthening.   The pt will benefit from further skilled PT to improve LUE pain, strength and mobility.  OBJECTIVE IMPAIRMENTS: decreased ROM, decreased strength, hypomobility, increased fascial restrictions, impaired flexibility, impaired UE functional use, and pain.   ACTIVITY LIMITATIONS: carrying, lifting, sleeping, dressing, and reach over head  PARTICIPATION LIMITATIONS: cleaning, laundry,  community activity, and yard work  PERSONAL FACTORS:  none  are also  affecting patient's functional outcome.   REHAB POTENTIAL: Good  CLINICAL DECISION MAKING: Stable/uncomplicated  EVALUATION COMPLEXITY: Low   GOALS: Goals reviewed with patient? Yes  SHORT TERM GOALS: Target date: 04/28/2022  Pt will be independent with HEP in order to improve strength and decrease pain in order to improve pain-free function at home and work. Baseline: Eval; updated Goal status: IN PROGRESS  2.   Pt will decrease worst pain in left shoulder as reported on NPRS by at least 1 points in order to demonstrate reduction in pain. Baseline: EVAL= 8/10; 2/15: can reach up to 6-7/10 with reaching behind his back Goal status: MET   LONG TERM GOALS: Target date: 06/09/2022  Pt will decrease quick DASH score by at least 8% in order to demonstrate clinically significant reduction in disability. Baseline: EVAL=20.5; 2/15: 20.5 Goal status: IN PROGRESS  2.  Pt will improve FOTO to target score of 67 to display perceived improvements in ability to complete ADL's.  Baseline: EVAL: 55; 2/15: 75  Goal status: MET  3.  Pt will decrease worst pain in left shoulder as reported on NPRS by at least 3 points in order to demonstrate clinically significant reduction in pain. Baseline: EVAL; 2/15: can reach up to 6-7/10 with reaching behind his back Goal status: IN PROGRESS  4.  Pt will increase strength of  by at least 1/2 MMT grade in order to demonstrate improvement in strength and function  Baseline: EVAL= 4/5 in available range with shoulder flex/abd/ER/IR.; 2/15: LUE shoulder strength grossly 4+/5, still some weakness in ER, RUE grossly 5/5 Goal status: MET    PLAN:  PT FREQUENCY: 1-2x/week  PT DURATION: other: 5 weeks  PLANNED INTERVENTIONS: Therapeutic exercises, Therapeutic activity, Neuromuscular re-education, Patient/Family education, Self Care, Joint mobilization, Vestibular training, Canalith repositioning, Dry Needling, Electrical stimulation, Spinal manipulation,  Spinal mobilization, Cryotherapy, Moist heat, Taping, Ultrasound, and Manual therapy  PLAN FOR NEXT SESSION: Review and progress painfree shoulder ROM; perform manual therapy as appropriate; progress HEP, UE strengthening as appropriate  1:55 PM,05/08/22  1:55 PM, 05/08/22 Ollen Bowl, PT Physical Therapist - [place] [facility]  Outpatient Physical Therapy- [place] [phone]

## 2022-05-12 ENCOUNTER — Ambulatory Visit: Payer: Medicare PPO | Attending: Internal Medicine

## 2022-05-12 DIAGNOSIS — R42 Dizziness and giddiness: Secondary | ICD-10-CM | POA: Diagnosis present

## 2022-05-12 DIAGNOSIS — G8929 Other chronic pain: Secondary | ICD-10-CM | POA: Diagnosis present

## 2022-05-12 DIAGNOSIS — M6281 Muscle weakness (generalized): Secondary | ICD-10-CM | POA: Insufficient documentation

## 2022-05-12 DIAGNOSIS — M25512 Pain in left shoulder: Secondary | ICD-10-CM | POA: Diagnosis not present

## 2022-05-12 DIAGNOSIS — R2681 Unsteadiness on feet: Secondary | ICD-10-CM | POA: Diagnosis present

## 2022-05-12 DIAGNOSIS — M25619 Stiffness of unspecified shoulder, not elsewhere classified: Secondary | ICD-10-CM | POA: Insufficient documentation

## 2022-05-12 NOTE — Therapy (Signed)
OUTPATIENT PHYSICAL THERAPY TREATMENT    Patient Name: Nicholas [name] MRN: [id number] DOB:18-Nov-1945, 77 y.o., male Today's Date: 05/12/2022  END OF SESSION:  PT End of Session - 05/12/22 1303     Visit Number 15    Number of Visits 23    Date for PT Re-Evaluation 06/09/22    Authorization Type [employer] PPO    Authorization Time Period 03/17/22-06/09/22    Progress Note Due on Visit 20    PT Start Time 1147    PT Stop Time 1229    PT Time Calculation (min) 42 min    Equipment Utilized During Treatment Gait belt    Activity Tolerance Patient tolerated treatment well;Patient limited by pain    Behavior During Therapy WFL for tasks assessed/performed                     Past Medical History:  Diagnosis Date   A-fib ([place])    Cancer ([place]) 10/25/2019   Prostate   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   Dysrhythmia    Pulmonary embolism ([facility])    Skin cancer    TIA (transient ischemic attack)    Past Surgical History:  Procedure Laterality Date   APPENDECTOMY     COLONOSCOPY WITH PROPOFOL N/A 11/23/2019   Procedure: COLONOSCOPY WITH PROPOFOL;  Surgeon: [name], Nicholas Pike, MD;  Location: [facility] ENDOSCOPY;  Service: Gastroenterology;  Laterality: N/A;   cyber knife surgery     PROSTATE SURGERY     Patient Active Problem List   Diagnosis Date Noted   COVID-19 virus infection 02/17/2021   A-fib ([place])    TIA (transient ischemic attack)    Lactic acidosis    Hyponatremia    Hypotension    Generalized weakness     PCP: Nicholas [name]  REFERRING PROVIDER: Dr. Hessie [name]  REFERRING DIAG: OA L Shoulder   THERAPY DIAG:  Chronic left shoulder pain  Muscle weakness (generalized)  Unsteadiness on feet  Rationale for Evaluation and Treatment: Rehabilitation  ONSET DATE: 01/08/2022  SUBJECTIVE:                                                                                                                                                                                       SUBJECTIVE STATEMENT: Pt reports he has been noticing his L shoulder more. It has been slightly more painful. He says it doesn't hurt all the time. Pt reports he has been more unsteady as well.  PERTINENT HISTORY: Patient is a 77 year old male with chronic history of left shoulder pain since [place] but progressively worse  over past 2-3 months. He report intermittent left shoulder pain with active movement and stiffness some at rest. Patient was most recently discharged last month from outpatient PT for gait/balance issues.   PAIN: Pain in Left shoulder  PRECAUTIONS: Fall  WEIGHT BEARING RESTRICTIONS: No  FALLS:  Has patient fallen in last 6 months? No  PATIENT GOALS: learn what to do to get over this pain in my shoulder   NEXT MD VISIT: Nicholas [name]- sometime in March per patient report   INTERVENTION THIS DATE 05/12/22   THEREX:   Wall ladder: LU -Flexion 2x -Abduction 2x  Pain free able to achieve WFL flexion, but abduction pain-limited  Pendulums CW/CC 10x of each LUE. Improves L shoulder pain  Seated rows: 7.5# 15x BUE  27.5# 15x BUE  32.5# 10x BUE  Wall Y pull-offs 2x10 BUE. Initial discomfort but pain improves with reps  Wall push-ups 15x no pain  On mat table: hooklye position [employer] 20x BUE. Reports some discomfort felt at about 90 deg abd, but very brief  2# weighted bar shoulder flexion 15x B 5# weighted bar shoulder flexion 10x with head donned LUE. Discontinued as pt reported increased pain in L shoulder.   4# dumbbell L shoulder serratus punch 2x12. Pain-free  L pec stretch 30 sec, then repeated for 30 sec with 1# dumbbell in LUE with UE in approx 60 deg abd, then without weight 1x 30 sec in 90 deg abduction, improvement in pain/discomfort  Cross body punch LUE 11x  Cross body punch with GTB 10x LUE. Pain-free  L shoulder ext isometric into towel 2x10 with 5 sec hold/rep  Snow  angels 10x BUE reports feels better Seated  abduction 10x, pain-limited to 90 deg LUE   Manual: LUE PROM flex/abduciton within pain-free range 10x for each. Pain improves with reps  PT provides education on using cane for safety/balance. Pt did not bring cane/walking stick today.     PATIENT EDUCATION: Pt educated throughout session about proper posture and technique with exercises. Improved exercise technique, movement at target joints, use of target muscles after min to mod verbal, visual, tactile cues. Instructs in importance on use of cane    HOME EXERCISE PROGRAM: pt to continue HEP as previously given, within pain-free range  Updated 2/15:  Access Code: [email or web address] URL: [email or web address] Date: 04/24/2022 Prepared by: Ricard Dillon  Exercises - Supine Isometric Shoulder Extension with Towel  - 1 x daily - 5 x weekly - 3 sets - 10 reps **corrected on sheet to reflect correct number of reps (10) - Standing Isometric Shoulder External Rotation with Doorway and Towel Roll  - 1 x daily - 5 x weekly - 3 sets - 10 reps   Access Code: [email or web address] URL: [email or web address] Date: 03/17/2022 Prepared by: Sande Brothers  Exercises - Supine Shoulder Flexion Extension AAROM with Dowel  - 1 x daily - 7 x weekly - 3 sets - 10 reps - Supine Shoulder Abduction AAROM with Dowel  - 1 x daily - 7 x weekly - 3 sets - 10 reps - Supine Shoulder External Rotation in 45 Degrees Abduction AAROM with Dowel  - 1 x daily - 7 x weekly - 3 sets - 10 reps - Standing Shoulder Internal Rotation Stretch with Towel  - 1 x daily - 7 x weekly - 3 sets - 10 reps - Standing Bilateral Shoulder Internal Rotation AAROM with Dowel  - 1 x daily - 7 x weekly - 3 sets - 10 reps  ASSESSMENT:  CLINICAL IMPRESSION: Pt presents slightly more pain-limited today with greater limitations in LUE abduction and weighted hooklye L shoulder flexion. Pt did show improvement with lower trap  interventions, even reporting improvement in L shoulder comfort with repetition. If pt pain continues to worsen despite therapy, he would likely benefit from further work-up/imaging of L shoulder. Will continue to monitor. The pt will benefit from further skilled PT to improve LUE pain, strength and mobility.  OBJECTIVE IMPAIRMENTS: decreased ROM, decreased strength, hypomobility, increased fascial restrictions, impaired flexibility, impaired UE functional use, and pain.   ACTIVITY LIMITATIONS: carrying, lifting, sleeping, dressing, and reach over head  PARTICIPATION LIMITATIONS: cleaning, laundry, community activity, and yard work  PERSONAL FACTORS:  none  are also affecting patient's functional outcome.   REHAB POTENTIAL: Good  CLINICAL DECISION MAKING: Stable/uncomplicated  EVALUATION COMPLEXITY: Low   GOALS: Goals reviewed with patient? Yes  SHORT TERM GOALS: Target date: 04/28/2022  Pt will be independent with HEP in order to improve strength and decrease pain in order to improve pain-free function at home and work. Baseline: Eval; updated Goal status: IN PROGRESS  2.   Pt will decrease worst pain in left shoulder as reported on NPRS by at least 1 points in order to demonstrate reduction in pain. Baseline: EVAL= 8/10; 2/15: can reach up to 6-7/10 with reaching behind his back Goal status: MET   LONG TERM GOALS: Target date: 06/09/2022  Pt will decrease quick DASH score by at least 8% in order to demonstrate clinically significant reduction in disability. Baseline: EVAL=20.5; 2/15: 20.5 Goal status: IN PROGRESS  2.  Pt will improve FOTO to target score of 67 to display perceived improvements in ability to complete ADL's.  Baseline: EVAL: 55; 2/15: 75  Goal status: MET  3.  Pt will decrease worst pain in left shoulder as reported on NPRS by at least 3 points in order to demonstrate clinically significant reduction in pain. Baseline: EVAL; 2/15: can reach up to 6-7/10 with  reaching behind his back Goal status: IN PROGRESS  4.  Pt will increase strength of  by at least 1/2 MMT grade in order to demonstrate improvement in strength and function  Baseline: EVAL= 4/5 in available range with shoulder flex/abd/ER/IR.; 2/15: LUE shoulder strength grossly 4+/5, still some weakness in ER, RUE grossly 5/5 Goal status: MET    PLAN:  PT FREQUENCY: 1-2x/week  PT DURATION: other: 5 weeks  PLANNED INTERVENTIONS: Therapeutic exercises, Therapeutic activity, Neuromuscular re-education, Patient/Family education, Self Care, Joint mobilization, Vestibular training, Canalith repositioning, Dry Needling, Electrical stimulation, Spinal manipulation, Spinal mobilization, Cryotherapy, Moist heat, Taping, Ultrasound, and Manual therapy  PLAN FOR NEXT SESSION: Review and progress painfree shoulder ROM; perform manual therapy as appropriate; UE strengthening as appropriate  1:12 PM,05/12/22  1:12 PM, 05/12/22 Ricard Dillon PT, DPT  Physical Therapist - [facility]  Outpatient Physical Therapy- [place] [phone]

## 2022-05-15 ENCOUNTER — Ambulatory Visit: Payer: Medicare PPO

## 2022-05-15 ENCOUNTER — Ambulatory Visit: Payer: Medicare Other

## 2022-05-15 DIAGNOSIS — M25619 Stiffness of unspecified shoulder, not elsewhere classified: Secondary | ICD-10-CM

## 2022-05-15 DIAGNOSIS — M6281 Muscle weakness (generalized): Secondary | ICD-10-CM

## 2022-05-15 DIAGNOSIS — G8929 Other chronic pain: Secondary | ICD-10-CM

## 2022-05-15 DIAGNOSIS — M25512 Pain in left shoulder: Secondary | ICD-10-CM | POA: Diagnosis not present

## 2022-05-15 NOTE — Therapy (Signed)
OUTPATIENT PHYSICAL THERAPY TREATMENT    Patient Name: Nicholas [name] MRN: [id number] DOB:April 14, 1945, 77 y.o., male Today's Date: 05/15/2022  END OF SESSION:  PT End of Session - 05/15/22 1310     Visit Number 16    Number of Visits 23    Date for PT Re-Evaluation 06/09/22    Authorization Type [employer] PPO    Authorization Time Period 03/17/22-06/09/22    Progress Note Due on Visit 20    PT Start Time 1018    PT Stop Time 1100    PT Time Calculation (min) 42 min    Equipment Utilized During Treatment Gait belt    Activity Tolerance Patient tolerated treatment well;Patient limited by pain    Behavior During Therapy WFL for tasks assessed/performed                      Past Medical History:  Diagnosis Date   A-fib ([place])    Cancer ([place]) 10/25/2019   Prostate   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   Dysrhythmia    Pulmonary embolism ([facility])    Skin cancer    TIA (transient ischemic attack)    Past Surgical History:  Procedure Laterality Date   APPENDECTOMY     COLONOSCOPY WITH PROPOFOL N/A 11/23/2019   Procedure: COLONOSCOPY WITH PROPOFOL;  Surgeon: [place], Benay Pike, MD;  Location: [facility] ENDOSCOPY;  Service: Gastroenterology;  Laterality: N/A;   cyber knife surgery     PROSTATE SURGERY     Patient Active Problem List   Diagnosis Date Noted   COVID-19 virus infection 02/17/2021   A-fib ([place])    TIA (transient ischemic attack)    Lactic acidosis    Hyponatremia    Hypotension    Generalized weakness     PCP: Dr. Emily Filbert  REFERRING PROVIDER: Dr. Hessie Knows  REFERRING DIAG: OA L Shoulder   THERAPY DIAG:  Chronic left shoulder pain  Muscle weakness (generalized)  Limited range of motion (ROM) of shoulder  Rationale for Evaluation and Treatment: Rehabilitation  ONSET DATE: 01/08/2022  SUBJECTIVE:                                                                                                                                                                                       SUBJECTIVE STATEMENT: Pt reports continued L shoulder pain, feels he is noticing it more lately.  PERTINENT HISTORY: Patient is a 77 year old male with chronic history of left shoulder pain since [place] but progressively worse over past 2-3 months. He report intermittent left shoulder pain with active movement and stiffness  some at rest. Patient was most recently discharged last month from outpatient PT for gait/balance issues.   PAIN: Pain in Left shoulder  PRECAUTIONS: Fall  WEIGHT BEARING RESTRICTIONS: No  FALLS:  Has patient fallen in last 6 months? No  PATIENT GOALS: learn what to do to get over this pain in my shoulder   NEXT MD VISIT: Dr. Rudene Christians- sometime in March per patient report   INTERVENTION THIS DATE 05/15/22   THEREX:   Nustep lvl 4 - 2.5 min fwd/bckwd (5 min total). unbilled  Pendulums CW/CC, FWD/BCKWD/ LTL 10x for each movement with LUE.   Wall isometrics with towel LUE: Flexion 10x with 3 sec hold/rep Extension 10x with 3 sec hold/rep Shoulder IR 10x with 3 sec hold/rep  Shoulder ER 10x with 3 sec hold/rep No pain with interventions   Wall ladder: LUE -Flexion 2x with second rep held 30 sec - cuing to perform in pain-free range  -Abduction 2x with second rep held 30 sec - cuing to perform in pain-free range  Flexion more pain limited today  Matrix cable machine LUE only seated row  -2.5# 10x  -7.5# 10x -12.5# 5x -17.5# 7x -22,5# 5x -27.5# 5x. Rates easy Pt with no pain with intervention  Seated body blade BUE 60 sec, LUE only 60 sec  Wall Y lift-offs 15x B Wall push-ups 2x15   Manual: STM and TrP release to UT L and posterior shoulder musculature x 6 min. One primary TrP noted in L UT- pt reports improvement in pain      PATIENT EDUCATION: Pt educated throughout session about proper posture and technique with exercises. Improved exercise  technique, movement at target joints, use of target muscles after min to mod verbal, visual, tactile cues. Instructs in importance on use of cane    HOME EXERCISE PROGRAM: pt to continue HEP as previously given, within pain-free range  Updated 2/15:  Access Code: [email or web address] URL: [email or web address] Date: 04/24/2022 Prepared by: Ricard Dillon  Exercises - Supine Isometric Shoulder Extension with Towel  - 1 x daily - 5 x weekly - 3 sets - 10 reps **corrected on sheet to reflect correct number of reps (10) - Standing Isometric Shoulder External Rotation with Doorway and Towel Roll  - 1 x daily - 5 x weekly - 3 sets - 10 reps   Access Code: [email or web address] URL: [email or web address] Date: 03/17/2022 Prepared by: Sande Brothers  Exercises - Supine Shoulder Flexion Extension AAROM with Dowel  - 1 x daily - 7 x weekly - 3 sets - 10 reps - Supine Shoulder Abduction AAROM with [employer]el  - 1 x daily - 7 x weekly - 3 sets - 10 reps - Supine Shoulder External Rotation in 45 Degrees Abduction AAROM with Dowel  - 1 x daily - 7 x weekly - 3 sets - 10 reps - Standing Shoulder Internal Rotation Stretch with Towel  - 1 x daily - 7 x weekly - 3 sets - 10 reps - Standing Bilateral Shoulder Internal Rotation AAROM with Dowel  - 1 x daily - 7 x weekly - 3 sets - 10 reps  ASSESSMENT:  CLINICAL IMPRESSION: Pt continues to present with higher levels of L shoulder pain. PT provides further education to pt that if shoulder continues to worsen pt would likely benefit from further imaging. Pt verbalizes understanding. PT palpated L shoulder and provided STM along pt's L UT as pt with one notable TrP. Following TrP release, pt reached out PT following session via secure email reporting  improvement in his shoulder pain, pt also reported decrease pain in session. The pt will benefit from further skilled PT to improve LUE pain, strength and mobility.  OBJECTIVE IMPAIRMENTS: decreased ROM,  decreased strength, hypomobility, increased fascial restrictions, impaired flexibility, impaired UE functional use, and pain.   ACTIVITY LIMITATIONS: carrying, lifting, sleeping, dressing, and reach over head  PARTICIPATION LIMITATIONS: cleaning, laundry, community activity, and yard work  PERSONAL FACTORS:  none  are also affecting patient's functional outcome.   REHAB POTENTIAL: Good  CLINICAL DECISION MAKING: Stable/uncomplicated  EVALUATION COMPLEXITY: Low   GOALS: Goals reviewed with patient? Yes  SHORT TERM GOALS: Target date: 04/28/2022  Pt will be independent with HEP in order to improve strength and decrease pain in order to improve pain-free function at home and work. Baseline: Eval; updated Goal status: IN PROGRESS  2.   Pt will decrease worst pain in left shoulder as reported on NPRS by at least 1 points in order to demonstrate reduction in pain. Baseline: EVAL= 8/10; 2/15: can reach up to 6-7/10 with reaching behind his back Goal status: MET   LONG TERM GOALS: Target date: 06/09/2022  Pt will decrease quick DASH score by at least 8% in order to demonstrate clinically significant reduction in disability. Baseline: EVAL=20.5; 2/15: 20.5 Goal status: IN PROGRESS  2.  Pt will improve FOTO to target score of 67 to display perceived improvements in ability to complete ADL's.  Baseline: EVAL: 55; 2/15: 75  Goal status: MET  3.  Pt will decrease worst pain in left shoulder as reported on NPRS by at least 3 points in order to demonstrate clinically significant reduction in pain. Baseline: EVAL; 2/15: can reach up to 6-7/10 with reaching behind his back Goal status: IN PROGRESS  4.  Pt will increase strength of  by at least 1/2 MMT grade in order to demonstrate improvement in strength and function  Baseline: EVAL= 4/5 in available range with shoulder flex/abd/ER/IR.; 2/15: LUE shoulder strength grossly 4+/5, still some weakness in ER, RUE grossly 5/5 Goal status:  MET    PLAN:  PT FREQUENCY: 1-2x/week  PT DURATION: other: 5 weeks  PLANNED INTERVENTIONS: Therapeutic exercises, Therapeutic activity, Neuromuscular re-education, Patient/Family education, Self Care, Joint mobilization, Vestibular training, Canalith repositioning, Dry Needling, Electrical stimulation, Spinal manipulation, Spinal mobilization, Cryotherapy, Moist heat, Taping, Ultrasound, and Manual therapy  PLAN FOR NEXT SESSION: Review and progress painfree shoulder ROM; perform manual therapy as appropriate; UE strengthening as appropriate  1:19 PM,05/15/22  1:19 PM, 05/15/22 Ricard Dillon PT, DPT  Physical Therapist - [facility]  Outpatient Physical Therapy- [place] [phone]

## 2022-05-19 ENCOUNTER — Ambulatory Visit: Payer: Medicare PPO

## 2022-05-20 ENCOUNTER — Ambulatory Visit: Payer: Medicare PPO

## 2022-05-20 DIAGNOSIS — M25619 Stiffness of unspecified shoulder, not elsewhere classified: Secondary | ICD-10-CM

## 2022-05-20 DIAGNOSIS — G8929 Other chronic pain: Secondary | ICD-10-CM

## 2022-05-20 DIAGNOSIS — M25512 Pain in left shoulder: Secondary | ICD-10-CM | POA: Diagnosis not present

## 2022-05-20 DIAGNOSIS — M6281 Muscle weakness (generalized): Secondary | ICD-10-CM

## 2022-05-20 NOTE — Therapy (Signed)
OUTPATIENT PHYSICAL THERAPY TREATMENT    Patient Name: Nicholas [name] MRN: [id number] DOB:Jul 19, 1945, 77 y.o., male Today's Date: 05/20/2022  END OF SESSION:  PT End of Session - 05/20/22 1101     Visit Number 17    Number of Visits 23    Date for PT Re-Evaluation 06/09/22    Authorization Type [employer] PPO    Authorization Time Period 03/17/22-06/09/22    Progress Note Due on Visit 20    PT Start Time 1018    PT Stop Time 1101    PT Time Calculation (min) 43 min    Equipment Utilized During Treatment Gait belt    Activity Tolerance Patient tolerated treatment well;Patient limited by pain    Behavior During Therapy WFL for tasks assessed/performed                       Past Medical History:  Diagnosis Date   A-fib ([place])    Cancer ([place]) 10/25/2019   Prostate   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   Dysrhythmia    Pulmonary embolism ([facility])    Skin cancer    TIA (transient ischemic attack)    Past Surgical History:  Procedure Laterality Date   APPENDECTOMY     COLONOSCOPY WITH PROPOFOL N/A 11/23/2019   Procedure: COLONOSCOPY WITH PROPOFOL;  Surgeon: Toledo, Benay Pike, MD;  Location: [facility] ENDOSCOPY;  Service: Gastroenterology;  Laterality: N/A;   cyber knife surgery     PROSTATE SURGERY     Patient Active Problem List   Diagnosis Date Noted   COVID-19 virus infection 02/17/2021   A-fib ([place])    TIA (transient ischemic attack)    Lactic acidosis    Hyponatremia    Hypotension    Generalized weakness     PCP: Dr. Emily Filbert  REFERRING PROVIDER: Dr. Hessie Knows  REFERRING DIAG: OA L Shoulder   THERAPY DIAG:  Chronic left shoulder pain  Limited range of motion (ROM) of shoulder  Muscle weakness (generalized)  Rationale for Evaluation and Treatment: Rehabilitation  ONSET DATE: 01/08/2022  SUBJECTIVE:                                                                                                                                                                                       SUBJECTIVE STATEMENT: Pt reports no current shoulder pain, but that his L shoulder was aching over the weekend a lot, has general difficulty with pain with movement. Pt did not come to PT Monday and changed appointment to today due to feeling unwell and weak. Pt reports multiple nights poor sleep and tha the is less steady. Pt not ambulating  with AD currently. PT provides instruction on importance of bringing/using SPC to decrease fall risk.  PERTINENT HISTORY: Patient is a 77 year old male with chronic history of left shoulder pain since [place] but progressively worse over past 2-3 months. He report intermittent left shoulder pain with active movement and stiffness some at rest. Patient was most recently discharged last month from outpatient PT for gait/balance issues.   PAIN: Pain in Left shoulder  PRECAUTIONS: Fall  WEIGHT BEARING RESTRICTIONS: No  FALLS:  Has patient fallen in last 6 months? No  PATIENT GOALS: learn what to do to get over this pain in my shoulder   NEXT MD VISIT: Dr. Rudene Christians- sometime in March per patient report   INTERVENTION THIS DATE 05/20/22   THEREX:   UBE lvl 4 - 2.5 min fwd/bckwd (5 min total). Unbilled. No pain   Manual: Heat donned to L shoulder while PT provides the following (pt reports nothing currently applied to L shoulder that would prohibit safe use of heat)- STM and TrP release to UT L and posterior shoulder musculature  2 TrP in L UT PROM L shoulder abd/flex 10x for each within pain-free range No adverse reaction to heat.   Wall isometrics with towel LUE: Flexion 10x with 3 sec hold/rep Extension 10x with 3 sec hold/rep Shoulder IR 10x with 3 sec hold/rep  Shoulder ER 10x with 3 sec hold/rep Slight pain noted with shoulder ext, improved when PT cued pt to decrease intensity  Hooklye on plinth-  2.5#  bar chest press 2x12, with 5# 1x12. Pain free    2.5# bar bilat shoulder flexion 1x13, 5# bar 1x15 able to increase pain-free shoulder flexoin   Snow angels 20x BUE. Pt reports a breif snap-like feeling around 90 degrees abd. Cuing to perform just below that point in order to perform intervention pain-free  4# bicep curl supine LUE 15x. Pt reports initial pain felt in lateral delt that did not reoccur after first rep  L Shoulder ext isometric into table in hooklye 2x10 with 5 sec hold/rep. Cuing to decrease intensity to improve comfort  Pt reports shoulder feeling OK at end of session.     PATIENT EDUCATION: Pt educated throughout session about proper posture and technique with exercises. Improved exercise technique, movement at target joints, use of target muscles after min to mod verbal, visual, tactile cues. Instructs in importance on use of cane    HOME EXERCISE PROGRAM: pt to continue HEP as previously given, within pain-free range  Updated 2/15:  Access Code: [email or web address] URL: [email or web address] Date: 04/24/2022 Prepared by: Ricard Dillon  Exercises - Supine Isometric Shoulder Extension with Towel  - 1 x daily - 5 x weekly - 3 sets - 10 reps **corrected on sheet to reflect correct number of reps (10) - Standing Isometric Shoulder External Rotation with Doorway and Towel Roll  - 1 x daily - 5 x weekly - 3 sets - 10 reps   Access Code: [email or web address] URL: [email or web address] Date: 03/17/2022 Prepared by: Sande Brothers  Exercises - Supine Shoulder Flexion Extension AAROM with Dowel  - 1 x daily - 7 x weekly - 3 sets - 10 reps - Supine Shoulder Abduction AAROM with Dowel  - 1 x daily - 7 x weekly - 3 sets - 10 reps - Supine Shoulder External Rotation in 45 Degrees Abduction AAROM with Dowel  - 1 x daily - 7 x weekly - 3 sets - 10 reps -  Standing Shoulder Internal Rotation Stretch with Towel  - 1 x daily - 7 x weekly - 3 sets - 10 reps - Standing Bilateral Shoulder Internal Rotation AAROM with  Dowel  - 1 x daily - 7 x weekly - 3 sets - 10 reps  ASSESSMENT:  CLINICAL IMPRESSION: Pt continues to report fluctuating and/or increased L shoulder symptoms outside of session. PT reinforces instruction on reducing ROM and intensity with exercises in order to perform them pain-free and to reduce likelihood of irritating L shoulder. Pt reported initial discomfort with multiple interventions, such as L shoulder flexion, abduction, and extension that improved with reps and/or modifications. Plan to complete remaining approved visits to determine progress, but plan to refer back to physician if pt does not improve or if shoulder worsens. The pt will benefit from further skilled PT to improve LUE pain, strength and mobility.  OBJECTIVE IMPAIRMENTS: decreased ROM, decreased strength, hypomobility, increased fascial restrictions, impaired flexibility, impaired UE functional use, and pain.   ACTIVITY LIMITATIONS: carrying, lifting, sleeping, dressing, and reach over head  PARTICIPATION LIMITATIONS: cleaning, laundry, community activity, and yard work  PERSONAL FACTORS:  none  are also affecting patient's functional outcome.   REHAB POTENTIAL: Good  CLINICAL DECISION MAKING: Stable/uncomplicated  EVALUATION COMPLEXITY: Low   GOALS: Goals reviewed with patient? Yes  SHORT TERM GOALS: Target date: 04/28/2022  Pt will be independent with HEP in order to improve strength and decrease pain in order to improve pain-free function at home and work. Baseline: Eval; updated Goal status: IN PROGRESS  2.   Pt will decrease worst pain in left shoulder as reported on NPRS by at least 1 points in order to demonstrate reduction in pain. Baseline: EVAL= 8/10; 2/15: can reach up to 6-7/10 with reaching behind his back Goal status: MET   LONG TERM GOALS: Target date: 06/09/2022  Pt will decrease quick DASH score by at least 8% in order to demonstrate clinically significant reduction in disability. Baseline:  EVAL=20.5; 2/15: 20.5 Goal status: IN PROGRESS  2.  Pt will improve FOTO to target score of 67 to display perceived improvements in ability to complete ADL's.  Baseline: EVAL: 55; 2/15: 75  Goal status: MET  3.  Pt will decrease worst pain in left shoulder as reported on NPRS by at least 3 points in order to demonstrate clinically significant reduction in pain. Baseline: EVAL; 2/15: can reach up to 6-7/10 with reaching behind his back Goal status: IN PROGRESS  4.  Pt will increase strength of  by at least 1/2 MMT grade in order to demonstrate improvement in strength and function  Baseline: EVAL= 4/5 in available range with shoulder flex/abd/ER/IR.; 2/15: LUE shoulder strength grossly 4+/5, still some weakness in ER, RUE grossly 5/5 Goal status: MET    PLAN:  PT FREQUENCY: 1-2x/week  PT DURATION: other: 5 weeks  PLANNED INTERVENTIONS: Therapeutic exercises, Therapeutic activity, Neuromuscular re-education, Patient/Family education, Self Care, Joint mobilization, Vestibular training, Canalith repositioning, Dry Needling, Electrical stimulation, Spinal manipulation, Spinal mobilization, Cryotherapy, Moist heat, Taping, Ultrasound, and Manual therapy  PLAN FOR NEXT SESSION: Review and progress painfree shoulder ROM; perform manual therapy as appropriate; UE strengthening as appropriate  2:26 PM,05/20/22  2:26 PM, 05/20/22 Ricard Dillon PT, DPT  Physical Therapist - [facility]  Outpatient Physical Therapy- [place] [phone]

## 2022-05-22 ENCOUNTER — Ambulatory Visit: Payer: Medicare Other

## 2022-05-22 ENCOUNTER — Ambulatory Visit: Payer: Medicare PPO

## 2022-05-22 DIAGNOSIS — G8929 Other chronic pain: Secondary | ICD-10-CM

## 2022-05-22 DIAGNOSIS — M25512 Pain in left shoulder: Secondary | ICD-10-CM | POA: Diagnosis not present

## 2022-05-22 DIAGNOSIS — M6281 Muscle weakness (generalized): Secondary | ICD-10-CM

## 2022-05-22 DIAGNOSIS — R2681 Unsteadiness on feet: Secondary | ICD-10-CM

## 2022-05-22 DIAGNOSIS — M25619 Stiffness of unspecified shoulder, not elsewhere classified: Secondary | ICD-10-CM

## 2022-05-22 DIAGNOSIS — R42 Dizziness and giddiness: Secondary | ICD-10-CM

## 2022-05-22 NOTE — Therapy (Signed)
OUTPATIENT PHYSICAL THERAPY TREATMENT    Patient Name: Nicholas [name] MRN: [id number] DOB:[birth year], 77 y.o., male Today's Date: 05/22/2022  END OF SESSION:  PT End of Session - 05/22/22 1025     Visit Number 18    Number of Visits 23    Date for PT Re-Evaluation 06/09/22    Authorization Type [employer] PPO    Authorization Time Period 03/17/22-06/09/22    Progress Note Due on Visit 20    PT Start Time 1015    PT Stop Time 1055    PT Time Calculation (min) 40 min    Equipment Utilized During Treatment Gait belt    Activity Tolerance Patient tolerated treatment well;No increased pain    Behavior During Therapy [facility] for tasks assessed/performed                       Past Medical History:  Diagnosis Date   A-fib ([place])    Cancer ([place]) 10/25/2019   Prostate   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   Dysrhythmia    Pulmonary embolism ([facility])    Skin cancer    TIA (transient ischemic attack)    Past Surgical History:  Procedure Laterality Date   APPENDECTOMY     COLONOSCOPY WITH PROPOFOL N/A 11/23/2019   Procedure: COLONOSCOPY WITH PROPOFOL;  Surgeon: Toledo, Benay Pike, MD;  Location: [facility] ENDOSCOPY;  Service: Gastroenterology;  Laterality: N/A;   cyber knife surgery     PROSTATE SURGERY     Patient Active Problem List   Diagnosis Date Noted   COVID-19 virus infection 02/17/2021   A-fib ([place])    TIA (transient ischemic attack)    Lactic acidosis    Hyponatremia    Hypotension    Generalized weakness     PCP: Dr. Emily Filbert  REFERRING PROVIDER: Dr. Hessie Knows  REFERRING DIAG: OA L Shoulder   THERAPY DIAG:  Chronic left shoulder pain  Limited range of motion (ROM) of shoulder  Muscle weakness (generalized)  Unsteadiness on feet  Dizziness and giddiness  Rationale for Evaluation and Treatment: Rehabilitation  ONSET DATE: 01/08/2022  SUBJECTIVE:                                                                                                                                                                                       SUBJECTIVE STATEMENT: Pt reports continued pain with daily use of arm peaks 3-4/10, not constant though. Pt says he'll have some aches is sitting with arm supported for 90 minutes.   PERTINENT HISTORY: Patient is a 77 year old male with chronic history of left shoulder pain since [place] but progressively worse over past 2-3 months. He report intermittent left shoulder pain with active movement and stiffness some at rest. Patient was most recently discharged last month from outpatient PT for gait/balance issues.   PAIN: Pain in Left shoulder  PRECAUTIONS: Fall  WEIGHT BEARING RESTRICTIONS: No  FALLS:  Has patient fallen in last 6 months? No  PATIENT GOALS: learn what to do to get over this pain in my shoulder   NEXT MD VISIT: Dr. Rudene Christians- sometime in March per patient report   INTERVENTION THIS DATE 05/22/22 -UBE lvl 4 - 2.5 min fwd/bckwd (5 min total). Unbilled. No pain  -Left shoulder lateral glide distraction mobilization in neutral angle 2x30 sec grade 4 -Left shoulder flexion A/ROM   -MFR to left anterior deltoid, lateral deltoid, brachialis   -Left chest press 1x16 c 7lb weight  -Left shoulder IR x10 @ 5lb, ER x12 @ 5lb (shoulder ABDCT to 60 degrees, elbow supported to remove shoulder extension)  -Left chest press 1x16 c 9lb weight  -Left shoulder IR x10 @ 5lb, ER x12 @ 5lb -long lever LLE shoulder flexion 0-90 (supine) c 3lb FW 1*15    PATIENT EDUCATION: Pt educated throughout session about proper posture and technique with exercises. Improved exercise technique, movement at target joints, use of target muscles after min to mod verbal, visual, tactile cues. Instructs in importance on use of cane    HOME EXERCISE PROGRAM: pt to continue HEP as previously given, within pain-free range  Updated 2/15:  Access Code: [email or web address] URL:  [email or web address] Date: 04/24/2022 Prepared by: Ricard Dillon  Exercises - Supine Isometric Shoulder Extension with Towel  - 1 x daily - 5 x weekly - 3 sets - 10 reps **corrected on sheet to reflect correct number of reps (10) - Standing Isometric Shoulder External Rotation with Doorway and Towel Roll  - 1 x daily - 5 x weekly - 3 sets - 10 reps   Access Code: [email or web address] URL: [email or web address] Date: 03/17/2022 Prepared by: Sande Brothers  Exercises - Supine Shoulder Flexion Extension AAROM with Dowel  - 1 x daily - 7 x weekly - 3 sets - 10 reps - Supine Shoulder Abduction AAROM with Dowel  - 1 x daily - 7 x weekly - 3 sets - 10 reps - Supine Shoulder External Rotation in 45 Degrees Abduction AAROM with Dowel  - 1 x daily - 7 x weekly - 3 sets - 10 reps - Standing Shoulder Internal Rotation Stretch with Towel  - 1 x daily - 7 x weekly - 3 sets - 10 reps - Standing Bilateral Shoulder Internal Rotation AAROM with Dowel  - 1 x daily - 7 x weekly - 3 sets - 10 reps  ASSESSMENT:  CLINICAL IMPRESSION: Response to therapy continues to appear as plateau, again discussed return to orthopedics for FU, potentially additional workup. Tolerance to strength interventions continues to go well, better today in supine posturing where peak loading forces are applied in more open packed joint positions. Spasm in deltoid likely a heavy contributor to discomfort given degree of restriction/tightness. The pt will benefit from further skilled PT to improve LUE pain, strength and mobility.  OBJECTIVE IMPAIRMENTS: decreased ROM, decreased strength, hypomobility, increased fascial restrictions, impaired flexibility, impaired UE functional use, and pain.   ACTIVITY LIMITATIONS: carrying, lifting, sleeping, dressing, and reach over head  PARTICIPATION LIMITATIONS: cleaning, laundry, community activity, and yard work  PERSONAL FACTORS:  none  are also affecting patient's  functional outcome.  REHAB POTENTIAL: Good  CLINICAL DECISION MAKING: Stable/uncomplicated  EVALUATION COMPLEXITY: Low   GOALS: Goals reviewed with patient? Yes  SHORT TERM GOALS: Target date: 04/28/2022  Pt will be independent with HEP in order to improve strength and decrease pain in order to improve pain-free function at home and work. Baseline: Eval; updated Goal status: IN PROGRESS  2.   Pt will decrease worst pain in left shoulder as reported on NPRS by at least 1 points in order to demonstrate reduction in pain. Baseline: EVAL= 8/10; 2/15: can reach up to 6-7/10 with reaching behind his back Goal status: MET   LONG TERM GOALS: Target date: 06/09/2022  Pt will decrease quick DASH score by at least 8% in order to demonstrate clinically significant reduction in disability. Baseline: EVAL=20.5; 2/15: 20.5 Goal status: IN PROGRESS  2.  Pt will improve FOTO to target score of 67 to display perceived improvements in ability to complete ADL's.  Baseline: EVAL: 55; 2/15: 75  Goal status: MET  3.  Pt will decrease worst pain in left shoulder as reported on NPRS by at least 3 points in order to demonstrate clinically significant reduction in pain. Baseline: EVAL; 2/15: can reach up to 6-7/10 with reaching behind his back Goal status: IN PROGRESS  4.  Pt will increase strength of  by at least 1/2 MMT grade in order to demonstrate improvement in strength and function  Baseline: EVAL= 4/5 in available range with shoulder flex/abd/ER/IR.; 2/15: LUE shoulder strength grossly 4+/5, still some weakness in ER, RUE grossly 5/5 Goal status: MET    PLAN:  PT FREQUENCY: 1-2x/week  PT DURATION: other: 5 weeks  PLANNED INTERVENTIONS: Therapeutic exercises, Therapeutic activity, Neuromuscular re-education, Patient/Family education, Self Care, Joint mobilization, Vestibular training, Canalith repositioning, Dry Needling, Electrical stimulation, Spinal manipulation, Spinal mobilization,  Cryotherapy, Moist heat, Taping, Ultrasound, and Manual therapy  PLAN FOR NEXT SESSION: Review and progress painfree shoulder ROM; perform manual therapy as appropriate; UE strengthening as appropriate  10:27 AM,05/22/22  10:27 AM, 05/22/22 [name], PT, DPT Physical Therapist - [place] [phone]     Physical Therapist - [place] [address] [phone]

## 2022-05-26 ENCOUNTER — Ambulatory Visit: Payer: Medicare PPO

## 2022-05-26 DIAGNOSIS — M6281 Muscle weakness (generalized): Secondary | ICD-10-CM

## 2022-05-26 DIAGNOSIS — G8929 Other chronic pain: Secondary | ICD-10-CM

## 2022-05-26 DIAGNOSIS — R2681 Unsteadiness on feet: Secondary | ICD-10-CM

## 2022-05-26 DIAGNOSIS — R42 Dizziness and giddiness: Secondary | ICD-10-CM

## 2022-05-26 DIAGNOSIS — M25512 Pain in left shoulder: Secondary | ICD-10-CM | POA: Diagnosis not present

## 2022-05-26 DIAGNOSIS — M25619 Stiffness of unspecified shoulder, not elsewhere classified: Secondary | ICD-10-CM

## 2022-05-26 NOTE — Therapy (Signed)
OUTPATIENT PHYSICAL THERAPY TREATMENT    Patient Name: Nicholas [name] MRN: [id number] DOB:November 01, 1945, 77 y.o., male Today's Date: 05/26/2022  END OF SESSION:  PT End of Session - 05/26/22 1045     Visit Number 19    Number of Visits 23    Date for PT Re-Evaluation 06/09/22    Authorization Type [employer] PPO    Authorization Time Period 03/17/22-06/09/22    Progress Note Due on Visit 20    PT Start Time 1015    PT Stop Time 1055    PT Time Calculation (min) 40 min    Activity Tolerance Patient tolerated treatment well;No increased pain    Behavior During Therapy [facility] for tasks assessed/performed                       Past Medical History:  Diagnosis Date   A-fib ([place])    Cancer ([place]) 10/25/2019   Prostate   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   Dysrhythmia    Pulmonary embolism ([facility])    Skin cancer    TIA (transient ischemic attack)    Past Surgical History:  Procedure Laterality Date   APPENDECTOMY     COLONOSCOPY WITH PROPOFOL N/A 11/23/2019   Procedure: COLONOSCOPY WITH PROPOFOL;  Surgeon: Toledo, Benay Pike, MD;  Location: [facility] ENDOSCOPY;  Service: Gastroenterology;  Laterality: N/A;   cyber knife surgery     PROSTATE SURGERY     Patient Active Problem List   Diagnosis Date Noted   COVID-19 virus infection 02/17/2021   A-fib ([place])    TIA (transient ischemic attack)    Lactic acidosis    Hyponatremia    Hypotension    Generalized weakness     PCP: Dr. Emily Filbert  REFERRING PROVIDER: Dr. Hessie Knows  REFERRING DIAG: OA L Shoulder   THERAPY DIAG:  Chronic left shoulder pain  Limited range of motion (ROM) of shoulder  Muscle weakness (generalized)  Unsteadiness on feet  Dizziness and giddiness  Rationale for Evaluation and Treatment: Rehabilitation  ONSET DATE: 01/08/2022  SUBJECTIVE:                                                                                                                                                                                       SUBJECTIVE STATEMENT: Pain conitnues with activity around the house, quite positional. HEP not performed per recommended frequency.   PERTINENT HISTORY: Patient is a 77 year old male with chronic history of left shoulder pain since stra[place] several [place] but progressively worse over past 2-3 months. He report intermittent left shoulder pain with active movement  and stiffness some at rest. Patient was most recently discharged last month from outpatient PT for gait/balance issues.   PAIN: Pain in Left shoulder  PRECAUTIONS: Fall  WEIGHT BEARING RESTRICTIONS: No  FALLS:  Has patient fallen in last 6 months? No  PATIENT GOALS: learn what to do to get over this pain in my shoulder   NEXT MD VISIT: Dr. Rudene Christians- sometime in March per patient report   INTERVENTION THIS DATE 05/26/22 -UBE lvl 4 - 2 min fwd/bckwd (4 min total). Unbilled. No pain  -Hoist Row plate 3 [id number] -Hoist Chest Press 2x12 at plate 3  -Hoist lat pulldown 1x10 @ 2 plates, extensive cues for form/posture (does not feel better)  -standing Left shoulder ABDCT 3lb 1x10, flexion 1x10, elbow flexion 7lb 1x10 -standing Left shoulder ABDCT 3lb 1x10, flexion 1x10, elbow flexion 7lb 1x10  PATIENT EDUCATION: Pt educated throughout session about proper posture and technique with exercises. Improved exercise technique, movement at target joints, use of target muscles after min to mod verbal, visual, tactile cues. Instructs in importance on use of cane    HOME EXERCISE PROGRAM: pt to continue HEP as previously given, within pain-free range  Updated 2/15:  Access Code: [email or web address] URL: [email or web address] Date: 04/24/2022 Prepared by: Ricard Dillon  Exercises - Supine Isometric Shoulder Extension with Towel  - 1 x daily - 5 x weekly - 3 sets - 10 reps **corrected on sheet to reflect correct number of reps (10) - Standing Isometric Shoulder External  Rotation with Doorway and Towel Roll  - 1 x daily - 5 x weekly - 3 sets - 10 reps   Access Code: [email or web address] URL: [email or web address] Date: 03/17/2022 Prepared by: Sande Brothers  Exercises - Supine Shoulder Flexion Extension AAROM with Dowel  - 1 x daily - 7 x weekly - 3 sets - 10 reps - Supine Shoulder Abduction AAROM with Dowel  - 1 x daily - 7 x weekly - 3 sets - 10 reps - Supine Shoulder External Rotation in 45 Degrees Abduction AAROM with Dowel  - 1 x daily - 7 x weekly - 3 sets - 10 reps - Standing Shoulder Internal Rotation Stretch with Towel  - 1 x daily - 7 x weekly - 3 sets - 10 reps - Standing Bilateral Shoulder Internal Rotation AAROM with Dowel  - 1 x daily - 7 x weekly - 3 sets - 10 reps  ASSESSMENT:  CLINICAL IMPRESSION: Again, seems that pt has made little progress in shoulder. He continues to complain of similar pain provocation in ADL/IADL performance. Strength remains fair, but weaker than CL side, however over past few weeks unable to advance resistance interventions. Recommend return to orthopedic for additional FU and potential return to clinic after that once additional workup has been completed. Will await new recommendations from Dr. Rudene Christians. Plan on canceling next 2 weeks while pt gets in with ortho.   OBJECTIVE IMPAIRMENTS: decreased ROM, decreased strength, hypomobility, increased fascial restrictions, impaired flexibility, impaired UE functional use, and pain.   ACTIVITY LIMITATIONS: carrying, lifting, sleeping, dressing, and reach over head  PARTICIPATION LIMITATIONS: cleaning, laundry, community activity, and yard work  PERSONAL FACTORS:  none  are also affecting patient's functional outcome.   REHAB POTENTIAL: Good  CLINICAL DECISION MAKING: Stable/uncomplicated  EVALUATION COMPLEXITY: Low   GOALS: Goals reviewed with patient? Yes  SHORT TERM GOALS: Target date: 04/28/2022  Pt will be independent with HEP in order to improve  strength and decrease pain in order to improve pain-free function at  home and work. Baseline: Eval; updated Goal status: IN PROGRESS  2.   Pt will decrease worst pain in left shoulder as reported on NPRS by at least 1 points in order to demonstrate reduction in pain. Baseline: EVAL= 8/10; 2/15: can reach up to 6-7/10 with reaching behind his back Goal status: MET   LONG TERM GOALS: Target date: 06/09/2022  Pt will decrease quick DASH score by at least 8% in order to demonstrate clinically significant reduction in disability. Baseline: EVAL=20.5; 2/15: 20.5 Goal status: IN PROGRESS  2.  Pt will improve FOTO to target score of 67 to display perceived improvements in ability to complete ADL's.  Baseline: EVAL: 55; 2/15: 75  Goal status: MET  3.  Pt will decrease worst pain in left shoulder as reported on NPRS by at least 3 points in order to demonstrate clinically significant reduction in pain. Baseline: EVAL; 2/15: can reach up to 6-7/10 with reaching behind his back Goal status: IN PROGRESS  4.  Pt will increase strength of  by at least 1/2 MMT grade in order to demonstrate improvement in strength and function  Baseline: EVAL= 4/5 in available range with shoulder flex/abd/ER/IR.; 2/15: LUE shoulder strength grossly 4+/5, still some weakness in ER, RUE grossly 5/5 Goal status: MET    PLAN:  PT FREQUENCY: 1-2x/week  PT DURATION: other: 5 weeks  PLANNED INTERVENTIONS: Therapeutic exercises, Therapeutic activity, Neuromuscular re-education, Patient/Family education, Self Care, Joint mobilization, Vestibular training, Canalith repositioning, Dry Needling, Electrical stimulation, Spinal manipulation, Spinal mobilization, Cryotherapy, Moist heat, Taping, Ultrasound, and Manual therapy  PLAN FOR NEXT SESSION: Review and progress painfree shoulder ROM; perform manual therapy as appropriate; UE strengthening as appropriate  10:48 AM,05/26/22  10:48 AM, 05/26/22 [name], PT,  DPT Physical Therapist - [place] [phone]     Physical Therapist - [facility]  Outpatient Physical Therapy- [place] [phone]

## 2022-05-29 ENCOUNTER — Ambulatory Visit: Payer: Medicare Other

## 2022-05-29 ENCOUNTER — Ambulatory Visit: Payer: Medicare PPO

## 2022-06-02 ENCOUNTER — Ambulatory Visit: Payer: Medicare PPO

## 2022-06-04 ENCOUNTER — Encounter: Payer: Self-pay | Admitting: Dermatology

## 2022-06-04 ENCOUNTER — Ambulatory Visit (INDEPENDENT_AMBULATORY_CARE_PROVIDER_SITE_OTHER): Payer: Medicare PPO | Admitting: Dermatology

## 2022-06-04 VITALS — BP 107/83

## 2022-06-04 DIAGNOSIS — Z872 Personal history of diseases of the skin and subcutaneous tissue: Secondary | ICD-10-CM

## 2022-06-04 DIAGNOSIS — L570 Actinic keratosis: Secondary | ICD-10-CM

## 2022-06-04 DIAGNOSIS — L219 Seborrheic dermatitis, unspecified: Secondary | ICD-10-CM

## 2022-06-04 DIAGNOSIS — L578 Other skin changes due to chronic exposure to nonionizing radiation: Secondary | ICD-10-CM | POA: Diagnosis not present

## 2022-06-04 DIAGNOSIS — Z79899 Other long term (current) drug therapy: Secondary | ICD-10-CM

## 2022-06-04 DIAGNOSIS — L820 Inflamed seborrheic keratosis: Secondary | ICD-10-CM

## 2022-06-04 DIAGNOSIS — L821 Other seborrheic keratosis: Secondary | ICD-10-CM

## 2022-06-04 NOTE — Patient Instructions (Signed)
Cryotherapy Aftercare  Wash gently with soap and water everyday.   Apply Vaseline and Band-Aid daily until healed.     Due to recent changes in healthcare laws, you may see results of your pathology and/or laboratory studies on MyChart before the doctors have had a chance to review them. We understand that in some cases there may be results that are confusing or concerning to you. Please understand that not all results are received at the same time and often the doctors may need to interpret multiple results in order to provide you with the best plan of care or course of treatment. Therefore, we ask that you please give us 2 business days to thoroughly review all your results before contacting the office for clarification. Should we see a critical lab result, you will be contacted sooner.   If You Need Anything After Your Visit  If you have any questions or concerns for your doctor, please call our main line at [phone] and press option 4 to reach your doctor's medical assistant. If no one answers, please leave a voicemail as directed and we will return your call as soon as possible. Messages left after 4 pm will be answered the following business day.   You may also send us a message via MyChart. We typically respond to MyChart messages within 1-2 business days.  For prescription refills, please ask your pharmacy to contact our office. Our fax number is [phone].  If you have an urgent issue when the clinic is closed that cannot wait until the next business day, you can page your doctor at the number below.    Please note that while we do our best to be available for urgent issues outside of office hours, we are not available 24/7.   If you have an urgent issue and are unable to reach us, you may choose to seek medical care at your doctor's office, retail clinic, urgent care center, or emergency room.  If you have a medical emergency, please immediately call 911 or go to the  emergency department.  Pager Numbers  - Dr. Kowalski: [phone]  - Dr. Moye: [phone]  - Dr. Stewart: [phone]  In the event of inclement weather, please call our main line at [phone] for an update on the status of any delays or closures.  Dermatology Medication Tips: Please keep the boxes that topical medications come in in order to help keep track of the instructions about where and how to use these. Pharmacies typically print the medication instructions only on the boxes and not directly on the medication tubes.   If your medication is too expensive, please contact our office at [phone] option 4 or send us a message through MyChart.   We are unable to tell what your co-pay for medications will be in advance as this is different depending on your insurance coverage. However, we may be able to find a substitute medication at lower cost or fill out paperwork to get insurance to cover a needed medication.   If a prior authorization is required to get your medication covered by your insurance company, please allow us 1-2 business days to complete this process.  Drug prices often vary depending on where the prescription is filled and some pharmacies may offer cheaper prices.  The website www.goodrx.com contains coupons for medications through different pharmacies. The prices here do not account for what the cost may be with help from insurance (it may be cheaper with your insurance), but the website can   give you the price if you did not use any insurance.  - You can print the associated coupon and take it with your prescription to the pharmacy.  - You may also stop by our office during regular business hours and pick up a GoodRx coupon card.  - If you need your prescription sent electronically to a different pharmacy, notify our office through [facility] MyChart or by phone at [phone] option 4.     Si Usted Necesita Algo Despus de Su Visita  Tambin puede  enviarnos un mensaje a travs de MyChart. Por lo general respondemos a los mensajes de MyChart en el transcurso de 1 a 2 das hbiles.  Para renovar recetas, por favor pida a su farmacia que se ponga en contacto con nuestra oficina. Nuestro nmero de fax es el [phone].  Si tiene un asunto urgente cuando la clnica est cerrada y que no puede esperar hasta el siguiente da hbil, puede llamar/localizar a su doctor(a) al nmero que aparece a continuacin.   Por favor, tenga en cuenta que aunque hacemos todo lo posible para estar disponibles para asuntos urgentes fuera del horario de oficina, no estamos disponibles las 24 horas del da, los 7 das de la semana.   Si tiene un problema urgente y no puede comunicarse con nosotros, puede optar por buscar atencin mdica  en el consultorio de su doctor(a), en una clnica privada, en un centro de atencin urgente o en una sala de emergencias.  Si tiene una emergencia mdica, por favor llame inmediatamente al 911 o vaya a la sala de emergencias.  Nmeros de bper  - Dr. Kowalski: [phone]  - Dra. Moye: [phone]  - Dra. Stewart: [phone]  En caso de inclemencias del tiempo, por favor llame a nuestra lnea principal al [phone] para una actualizacin sobre el estado de cualquier retraso o cierre.  Consejos para la medicacin en dermatologa: Por favor, guarde las cajas en las que vienen los medicamentos de uso tpico para ayudarle a seguir las instrucciones sobre dnde y cmo usarlos. Las farmacias generalmente imprimen las instrucciones del medicamento slo en las cajas y no directamente en los tubos del medicamento.   Si su medicamento es muy caro, por favor, pngase en contacto con nuestra oficina llamando al [phone] y presione la opcin 4 o envenos un mensaje a travs de MyChart.   No podemos decirle cul ser su copago por los medicamentos por adelantado ya que esto es diferente dependiendo de la cobertura de su seguro.  Sin embargo, es posible que podamos encontrar un medicamento sustituto a menor costo o llenar un formulario para que el seguro cubra el medicamento que se considera necesario.   Si se requiere una autorizacin previa para que su compaa de seguros cubra su medicamento, por favor permtanos de 1 a 2 das hbiles para completar este proceso.  Los precios de los medicamentos varan con frecuencia dependiendo del lugar de dnde se surte la receta y alguna farmacias pueden ofrecer precios ms baratos.  El sitio web www.goodrx.com tiene cupones para medicamentos de diferentes farmacias. Los precios aqu no tienen en cuenta lo que podra costar con la ayuda del seguro (puede ser ms barato con su seguro), pero el sitio web puede darle el precio si no utiliz ningn seguro.  - Puede imprimir el cupn correspondiente y llevarlo con su receta a la farmacia.  - Tambin puede pasar por nuestra oficina durante el horario de atencin regular y recoger una tarjeta de cupones de GoodRx.  -   Si necesita que su receta se enve electrnicamente a una farmacia diferente, informe a nuestra oficina a travs de MyChart de [place] o por telfono llamando al [phone] y presione la opcin 4.  

## 2022-06-04 NOTE — Progress Notes (Signed)
Follow-Up Visit   Subjective  Nicholas [name] is a 77 y.o. male who presents for the following: AK follow up of nose treated with LN2. ISK folow up of back and right forearm treated with LN2. Recheck seb derm of postauricular areas. He is treating with Ketoconazole 2% cream 3 times per week and Hydrocortisone 2.5% cream 3 times per week. Spot of right thigh that is sore.  The following portions of the chart were reviewed this encounter and updated as appropriate: medications, allergies, medical history  Review of Systems:  No other skin or systemic complaints except as noted in HPI or Assessment and Plan.  Objective  Well appearing patient in no apparent distress; mood and affect are within normal limits.  A focused examination was performed of the following areas: face, back  Relevant exam findings are noted in the Assessment and Plan.   Assessment & Plan   SEBORRHEIC KERATOSIS - Stuck-on, waxy, tan-brown papules and/or plaques  - Benign-appearing - Discussed benign etiology and prognosis. - Observe - Call for any changes  SEBORRHEIC DERMATITIS Exam: Clear today Well controlled.  Seborrheic Dermatitis is a chronic persistent rash characterized by pinkness and scaling most commonly of the mid face but also can occur on the scalp (dandruff), ears; mid chest, mid back and groin.  It tends to be exacerbated by stress and cooler weather.  People who have neurologic disease may experience new onset or exacerbation of existing seborrheic dermatitis.  The condition is not curable but treatable and can be controlled.  Treatment Plan: - Continue Ketoconazole 2% cream 3 times per week, Hydrocortisone 2.5% cream 3 times per week - may decrease to 1-2 times per week if staying well controlled  ACTINIC KERATOSIS Exam: Erythematous thin papules/macules with gritty scale  Actinic keratoses are precancerous spots that appear secondary to cumulative UV radiation exposure/sun exposure over  time. They are chronic with expected duration over 1 year. A portion of actinic keratoses will progress to squamous cell carcinoma of the skin. It is not possible to reliably predict which spots will progress to skin cancer and so treatment is recommended to prevent development of skin cancer.  Recommend daily broad spectrum sunscreen SPF 30+ to sun-exposed areas, reapply every 2 hours as needed.  Recommend staying in the shade or wearing long sleeves, sun glasses (UVA+UVB protection) and wide brim hats (4-inch brim around the entire circumference of the hat). Call for new or changing lesions.  Treatment Plan: Prior to procedure, discussed risks of blister formation, small wound, skin dyspigmentation, or rare scar following cryotherapy. Recommend Vaseline ointment to treated areas while healing.  Destruction Procedure Note Destruction method: cryotherapy   Informed consent: discussed and consent obtained   Lesion destroyed using liquid nitrogen: Yes   Outcome: patient tolerated procedure well with no complications   Post-procedure details: wound care instructions given   Locations: left nasal root # of Lesions Treated: 1   INFLAMED SEBORRHEIC KERATOSIS Exam: Erythematous keratotic or waxy stuck-on papule or plaque. Symptomatic, irritating, patient would like treated. Benign-appearing.  Call clinic for new or changing lesions.  Prior to procedure, discussed risks of blister formation, small wound, skin dyspigmentation, or rare scar following treatment. Recommend Vaseline ointment to treated areas while healing.  Destruction Procedure Note Destruction method: cryotherapy   Informed consent: discussed and consent obtained   Lesion destroyed using liquid nitrogen: Yes   Outcome: patient tolerated procedure well with no complications   Post-procedure details: wound care instructions given   Locations: face,  postauricular areas, right temple, right thigh # of Lesions Treated: 27  HISTORY  OF PRECANCEROUS ACTINIC KERATOSIS - site(s) of PreCancerous Actinic Keratosis clear today. - these may recur and new lesions may form requiring treatment to prevent transformation into skin cancer - observe for new or changing spots and contact [facility] for appointment if occur - photoprotection with sun protective clothing; sunglasses and broad spectrum sunscreen with SPF of at least 30 + and frequent self skin exams recommended - yearly exams by a dermatologist recommended for persons with history of PreCancerous Actinic Keratoses  ACTINIC DAMAGE - chronic, secondary to cumulative UV radiation exposure/sun exposure over time - diffuse scaly erythematous macules with underlying dyspigmentation - Recommend daily broad spectrum sunscreen SPF 30+ to sun-exposed areas, reapply every 2 hours as needed.  - Recommend staying in the shade or wearing long sleeves, sun glasses (UVA+UVB protection) and wide brim hats (4-inch brim around the entire circumference of the hat). - Call for new or changing lesions.  Return in about 6 months (around 12/05/2022) for Follow up.  I, Ashok Cordia, CMA, am acting as scribe for Sarina Ser, MD .  Documentation: I have reviewed the above documentation for accuracy and completeness, and I agree with the above.  Sarina Ser, MD

## 2022-06-05 ENCOUNTER — Ambulatory Visit: Payer: Medicare Other

## 2022-06-09 ENCOUNTER — Ambulatory Visit: Payer: Medicare PPO

## 2022-06-12 ENCOUNTER — Ambulatory Visit: Payer: Medicare Other

## 2022-06-12 ENCOUNTER — Ambulatory Visit: Payer: Medicare PPO

## 2022-06-16 ENCOUNTER — Ambulatory Visit: Payer: Medicare PPO

## 2022-06-19 ENCOUNTER — Ambulatory Visit: Payer: Medicare Other

## 2022-06-26 ENCOUNTER — Inpatient Hospital Stay
Admission: EM | Admit: 2022-06-26 | Discharge: 2022-06-30 | DRG: 854 | Disposition: A | Discharge status: Home Health | Payer: Medicare PPO | Attending: Internal Medicine | Admitting: Internal Medicine

## 2022-06-26 ENCOUNTER — Ambulatory Visit: Payer: Medicare Other

## 2022-06-26 ENCOUNTER — Other Ambulatory Visit: Payer: Self-pay | Admitting: Orthopedic Surgery

## 2022-06-26 ENCOUNTER — Emergency Department: Payer: Medicare PPO

## 2022-06-26 DIAGNOSIS — M7989 Other specified soft tissue disorders: Secondary | ICD-10-CM | POA: Diagnosis present

## 2022-06-26 DIAGNOSIS — A419 Sepsis, unspecified organism: Secondary | ICD-10-CM | POA: Diagnosis present

## 2022-06-26 DIAGNOSIS — Z85828 Personal history of other malignant neoplasm of skin: Secondary | ICD-10-CM

## 2022-06-26 DIAGNOSIS — E876 Hypokalemia: Secondary | ICD-10-CM | POA: Diagnosis present

## 2022-06-26 DIAGNOSIS — Z79899 Other long term (current) drug therapy: Secondary | ICD-10-CM

## 2022-06-26 DIAGNOSIS — M19012 Primary osteoarthritis, left shoulder: Secondary | ICD-10-CM

## 2022-06-26 DIAGNOSIS — B9561 Methicillin susceptible Staphylococcus aureus infection as the cause of diseases classified elsewhere: Secondary | ICD-10-CM | POA: Diagnosis present

## 2022-06-26 DIAGNOSIS — I110 Hypertensive heart disease with heart failure: Secondary | ICD-10-CM | POA: Diagnosis present

## 2022-06-26 DIAGNOSIS — E1142 Type 2 diabetes mellitus with diabetic polyneuropathy: Secondary | ICD-10-CM | POA: Diagnosis present

## 2022-06-26 DIAGNOSIS — L03031 Cellulitis of right toe: Secondary | ICD-10-CM

## 2022-06-26 DIAGNOSIS — M1A9XX0 Chronic gout, unspecified, without tophus (tophi): Secondary | ICD-10-CM | POA: Diagnosis present

## 2022-06-26 DIAGNOSIS — Z6822 Body mass index (BMI) 22.0-22.9, adult: Secondary | ICD-10-CM

## 2022-06-26 DIAGNOSIS — L03115 Cellulitis of right lower limb: Secondary | ICD-10-CM

## 2022-06-26 DIAGNOSIS — E11621 Type 2 diabetes mellitus with foot ulcer: Secondary | ICD-10-CM | POA: Diagnosis present

## 2022-06-26 DIAGNOSIS — I959 Hypotension, unspecified: Secondary | ICD-10-CM | POA: Diagnosis present

## 2022-06-26 DIAGNOSIS — I5032 Chronic diastolic (congestive) heart failure: Secondary | ICD-10-CM | POA: Diagnosis present

## 2022-06-26 DIAGNOSIS — I480 Paroxysmal atrial fibrillation: Secondary | ICD-10-CM | POA: Diagnosis not present

## 2022-06-26 DIAGNOSIS — Z7901 Long term (current) use of anticoagulants: Secondary | ICD-10-CM

## 2022-06-26 DIAGNOSIS — G629 Polyneuropathy, unspecified: Secondary | ICD-10-CM

## 2022-06-26 DIAGNOSIS — M109 Gout, unspecified: Secondary | ICD-10-CM | POA: Insufficient documentation

## 2022-06-26 DIAGNOSIS — Z8249 Family history of ischemic heart disease and other diseases of the circulatory system: Secondary | ICD-10-CM | POA: Diagnosis not present

## 2022-06-26 DIAGNOSIS — Z7982 Long term (current) use of aspirin: Secondary | ICD-10-CM

## 2022-06-26 DIAGNOSIS — E669 Obesity, unspecified: Secondary | ICD-10-CM | POA: Diagnosis present

## 2022-06-26 DIAGNOSIS — Z888 Allergy status to other drugs, medicaments and biological substances status: Secondary | ICD-10-CM | POA: Diagnosis not present

## 2022-06-26 DIAGNOSIS — G4700 Insomnia, unspecified: Secondary | ICD-10-CM | POA: Diagnosis present

## 2022-06-26 DIAGNOSIS — L97518 Non-pressure chronic ulcer of other part of right foot with other specified severity: Secondary | ICD-10-CM | POA: Diagnosis present

## 2022-06-26 DIAGNOSIS — Z8673 Personal history of transient ischemic attack (TIA), and cerebral infarction without residual deficits: Secondary | ICD-10-CM | POA: Diagnosis not present

## 2022-06-26 DIAGNOSIS — Z8546 Personal history of malignant neoplasm of prostate: Secondary | ICD-10-CM

## 2022-06-26 DIAGNOSIS — Z880 Allergy status to penicillin: Secondary | ICD-10-CM

## 2022-06-26 DIAGNOSIS — L0390 Cellulitis, unspecified: Principal | ICD-10-CM

## 2022-06-26 DIAGNOSIS — N179 Acute kidney failure, unspecified: Secondary | ICD-10-CM | POA: Diagnosis present

## 2022-06-26 DIAGNOSIS — Z86711 Personal history of pulmonary embolism: Secondary | ICD-10-CM

## 2022-06-26 DIAGNOSIS — Z8616 Personal history of COVID-19: Secondary | ICD-10-CM

## 2022-06-26 LAB — URINALYSIS, ROUTINE W REFLEX MICROSCOPIC
Bacteria, UA: NONE SEEN
Bilirubin Urine: NEGATIVE
Glucose, UA: NEGATIVE mg/dL
Ketones, ur: NEGATIVE mg/dL
Leukocytes,Ua: NEGATIVE
Nitrite: NEGATIVE
Protein, ur: 30 mg/dL — AB
Specific Gravity, Urine: 1.028 (ref 1.005–1.030)
pH: 7 (ref 5.0–8.0)

## 2022-06-26 LAB — CBC WITH DIFFERENTIAL/PLATELET
Abs Immature Granulocytes: 0.08 10*3/uL — ABNORMAL HIGH (ref 0.00–0.07)
Basophils Absolute: 0.1 10*3/uL (ref 0.0–0.1)
Basophils Relative: 0 %
Eosinophils Absolute: 0 10*3/uL (ref 0.0–0.5)
Eosinophils Relative: 0 %
HCT: 45.7 % (ref 39.0–52.0)
Hemoglobin: 15.3 g/dL (ref 13.0–17.0)
Immature Granulocytes: 1 %
Lymphocytes Relative: 7 %
Lymphs Abs: 1.2 10*3/uL (ref 0.7–4.0)
MCH: 32.3 pg (ref 26.0–34.0)
MCHC: 33.5 g/dL (ref 30.0–36.0)
MCV: 96.4 fL (ref 80.0–100.0)
Monocytes Absolute: 1 10*3/uL (ref 0.1–1.0)
Monocytes Relative: 6 %
Neutro Abs: 14.7 10*3/uL — ABNORMAL HIGH (ref 1.7–7.7)
Neutrophils Relative %: 86 %
Platelets: 216 10*3/uL (ref 150–400)
RBC: 4.74 MIL/uL (ref 4.22–5.81)
RDW: 13 % (ref 11.5–15.5)
WBC: 17 10*3/uL — ABNORMAL HIGH (ref 4.0–10.5)
nRBC: 0 % (ref 0.0–0.2)

## 2022-06-26 LAB — COMPREHENSIVE METABOLIC PANEL
ALT: 20 U/L (ref 0–44)
AST: 28 U/L (ref 15–41)
Albumin: 4 g/dL (ref 3.5–5.0)
Alkaline Phosphatase: 56 U/L (ref 38–126)
Anion gap: 7 (ref 5–15)
BUN: 14 mg/dL (ref 8–23)
CO2: 26 mmol/L (ref 22–32)
Calcium: 9.4 mg/dL (ref 8.9–10.3)
Chloride: 103 mmol/L (ref 98–111)
Creatinine, Ser: 1.25 mg/dL — ABNORMAL HIGH (ref 0.61–1.24)
GFR, Estimated: 59 mL/min — ABNORMAL LOW (ref >60)
Glucose, Bld: 115 mg/dL — ABNORMAL HIGH (ref 70–99)
Potassium: 4.2 mmol/L (ref 3.5–5.1)
Sodium: 136 mmol/L (ref 135–145)
Total Bilirubin: 1.6 mg/dL — ABNORMAL HIGH (ref 0.3–1.2)
Total Protein: 6.6 g/dL (ref 6.5–8.1)

## 2022-06-26 LAB — LACTIC ACID, PLASMA: Lactic Acid, Venous: 1.5 mmol/L (ref 0.5–1.9)

## 2022-06-26 MED ORDER — OCUVITE-LUTEIN PO CAPS
1.0000 | ORAL_CAPSULE | Freq: Every day | ORAL | Status: DC
  Filled 2022-06-26: qty 1

## 2022-06-26 MED ORDER — LACTATED RINGERS IV SOLN
150.0000 mL/h | INTRAVENOUS | Status: AC
  Administered 2022-06-27 (×2): 150 mL/h via INTRAVENOUS

## 2022-06-26 MED ORDER — SODIUM CHLORIDE 0.9 % IV SOLN
2.0000 g | Freq: Once | INTRAVENOUS | Status: AC
  Administered 2022-06-26: 2 g via INTRAVENOUS
  Filled 2022-06-26: qty 20

## 2022-06-26 MED ORDER — LACTATED RINGERS IV BOLUS
1000.0000 mL | Freq: Once | INTRAVENOUS | Status: AC
  Administered 2022-06-26: 1000 mL via INTRAVENOUS

## 2022-06-26 MED ORDER — ONDANSETRON HCL 4 MG PO TABS: 4.0000 mg | ORAL_TABLET | Freq: Four times a day (QID) | ORAL | Status: DC | PRN

## 2022-06-26 MED ORDER — ASPIRIN 81 MG PO TBEC
81.0000 mg | DELAYED_RELEASE_TABLET | Freq: Every day | ORAL | Status: DC
  Administered 2022-06-27 – 2022-06-30 (×3): 81 mg via ORAL
  Filled 2022-06-26 (×3): qty 1

## 2022-06-26 MED ORDER — ENOXAPARIN SODIUM 40 MG/0.4ML IJ SOSY: 40.0000 mg | PREFILLED_SYRINGE | INTRAMUSCULAR | Status: DC

## 2022-06-26 MED ORDER — VITAMIN C 500 MG PO TABS
250.0000 mg | ORAL_TABLET | Freq: Two times a day (BID) | ORAL | Status: DC
  Administered 2022-06-27 – 2022-06-30 (×6): 250 mg via ORAL
  Filled 2022-06-26 (×6): qty 1

## 2022-06-26 MED ORDER — MAGNESIUM HYDROXIDE 400 MG/5ML PO SUSP: 30.0000 mL | Freq: Every day | ORAL | Status: DC | PRN

## 2022-06-26 MED ORDER — SODIUM CHLORIDE 0.9 % IV SOLN
2.0000 g | INTRAVENOUS | Status: DC
  Administered 2022-06-27 – 2022-06-28 (×2): 2 g via INTRAVENOUS
  Filled 2022-06-26: qty 20
  Filled 2022-06-26 (×2): qty 2

## 2022-06-26 MED ORDER — ATENOLOL 25 MG PO TABS
12.5000 mg | ORAL_TABLET | Freq: Two times a day (BID) | ORAL | Status: DC
  Administered 2022-06-28 – 2022-06-30 (×3): 12.5 mg via ORAL
  Filled 2022-06-26 (×5): qty 0.5

## 2022-06-26 MED ORDER — ZINC SULFATE 220 (50 ZN) MG PO CAPS
220.0000 mg | ORAL_CAPSULE | Freq: Every day | ORAL | Status: DC
  Administered 2022-06-27 – 2022-06-30 (×3): 220 mg via ORAL
  Filled 2022-06-26 (×3): qty 1

## 2022-06-26 MED ORDER — ACETAMINOPHEN 650 MG RE SUPP: 650.0000 mg | Freq: Four times a day (QID) | RECTAL | Status: DC | PRN

## 2022-06-26 MED ORDER — COLCHICINE 0.6 MG PO TABS
0.6000 mg | ORAL_TABLET | ORAL | Status: DC | PRN
  Administered 2022-06-29: 0.6 mg via ORAL
  Filled 2022-06-26: qty 1

## 2022-06-26 MED ORDER — RIVAROXABAN 20 MG PO TABS: 20.0000 mg | ORAL_TABLET | Freq: Every day | ORAL | Status: DC

## 2022-06-26 MED ORDER — TRAZODONE HCL 50 MG PO TABS
25.0000 mg | ORAL_TABLET | Freq: Every evening | ORAL | Status: DC | PRN
  Filled 2022-06-26: qty 1

## 2022-06-26 MED ORDER — ONDANSETRON HCL 4 MG/2ML IJ SOLN: 4.0000 mg | Freq: Four times a day (QID) | INTRAMUSCULAR | Status: DC | PRN

## 2022-06-26 MED ORDER — FEBUXOSTAT 40 MG PO TABS
40.0000 mg | ORAL_TABLET | Freq: Every day | ORAL | Status: DC
  Administered 2022-06-27 – 2022-06-30 (×3): 40 mg via ORAL
  Filled 2022-06-26 (×4): qty 1

## 2022-06-26 MED ORDER — ACETAMINOPHEN 325 MG PO TABS
650.0000 mg | ORAL_TABLET | Freq: Four times a day (QID) | ORAL | Status: DC | PRN
  Administered 2022-06-29: 650 mg via ORAL
  Filled 2022-06-26: qty 2

## 2022-06-26 NOTE — ED Provider Notes (Signed)
[facility] Provider Note    Event Date/Time   First MD Initiated Contact with Patient 06/26/22 2130     (approximate)   History   Leg Pain   HPI  Nicholas [name] is a 77 y.o. 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  Patient has had issues with the right second toe for several months.  Follows with podiatry and has been on Keflex for 2 weeks.  This is rather stable but today woke up with swelling redness and pain of the right calf.  He had chills today wife says he had rigors and shaking and is felt a little bit drowsy.  Also felt fatigued.  Denies any injury or new wound.     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   Dysrhythmia    Pulmonary embolism (HCC)    Skin cancer    TIA (transient ischemic attack)     Patient Active Problem List   Diagnosis Date Noted   COVID-19 virus infection 02/17/2021   A-fib    TIA (transient ischemic attack)    Lactic acidosis    Hyponatremia    Hypotension    Generalized weakness      Physical Exam  Triage Vital Signs: ED Triage Vitals  Enc Vitals Group     BP 06/26/22 2023 (!) 109/55     Pulse Rate 06/26/22 2023 92     Resp 06/26/22 2023 16     Temp 06/26/22 2023 97.9 F (36.6 C)     Temp src --      SpO2 06/26/22 2023 97 %     Weight 06/26/22 2024 186 lb (84.4 kg)     Height 06/26/22 2024 6\' 4"  (1.93 m)     Head Circumference --      Peak Flow --      Pain Score 06/26/22 2024 1     Pain Loc --      Pain Edu? --      Excl. in GC? --     Most recent vital signs: Vitals:   06/26/22 2023  BP: (!) 109/55  Pulse: 92  Resp: 16  Temp: 97.9 F (36.6 C)  SpO2: 97%     General: Awake, no distress.  CV:  Good peripheral perfusion.  Resp:  Normal effort.  Abd:  No distention.  Neuro:             Awake,  Alert, Oriented x 3  Other:  Right second toe is erythematous and there is ulceration at the base, the foot has normal color The ankle up to the proximal calf is erythematous warm and tender to palpation there is pitting edema 2+ DP pulse   ED Results / Procedures / Treatments  Labs (all labs ordered are listed, but only abnormal results are displayed) Labs Reviewed  CBC WITH DIFFERENTIAL/PLATELET - Abnormal; Notable for the following components:      Result Value   WBC 17.0 (*)    Neutro Abs 14.7 (*)    Abs Immature Granulocytes 0.08 (*)    All other components within normal limits  COMPREHENSIVE METABOLIC PANEL - Abnormal; Notable for the following components:   Glucose, Bld 115 (*)    Creatinine, Ser 1.25 (*)    Total Bilirubin 1.6 (*)  GFR, Estimated 59 (*)    All other components within normal limits  CULTURE, BLOOD (ROUTINE X 2)  CULTURE, BLOOD (ROUTINE X 2)  LACTIC ACID, PLASMA  LACTIC ACID, PLASMA  URINALYSIS, ROUTINE W REFLEX MICROSCOPIC     EKG     RADIOLOGY    PROCEDURES:  Critical Care performed: No  Procedures   MEDICATIONS ORDERED IN ED: Medications  cefTRIAXone (ROCEPHIN) 2 g in sodium chloride 0.9 % 100 mL IVPB (has no administration in time range)  lactated ringers bolus 1,000 mL (has no administration in time range)     IMPRESSION / MDM / ASSESSMENT AND PLAN / ED COURSE  I reviewed the triage vital signs and the nursing notes.                              Patient's presentation is most consistent with acute complicated illness / injury requiring diagnostic workup.  Differential diagnosis includes, but is not limited to, cellulitis, DVT, low suspicion for abscess or necrotizing infection, osteomyelitis  Patient is 77 year old male who presents because of leg swelling and pain.  He has issues with the right great toe for some time has been seeing podiatry and having it debrided.  Just finished a course of 2 weeks of Keflex.  Presents  today because the right leg including ankle to calf is red swollen and painful and he had chills at home.  Patient's blood pressure is on the soft side but MAP is appropriate afebrile.  Overall he looks well.  The right toe is erythematous diffusely there is ulceration at the tip but patient tells me this is how it looked for some time.  The foot itself is not red but the ankle up to the proximal calf is erythematous with some petechiae it is tender and warm.  He is neurovascularly intact.  Labs are notable for leukocytosis to 17 mild AKI.  Lactate is negative.  Will add on blood cultures.  Given patient's age reported chills and his leukocytosis with the rapidity of onset I do feel that he should be admitted for observation IV antibiotics.  I have added on a DVT study of the patient's anticoagulated feel this is less likely.      FINAL CLINICAL IMPRESSION(S) / ED DIAGNOSES   Final diagnoses:  Cellulitis, unspecified cellulitis site     Rx / DC Orders   ED Discharge Orders     None        Note:  This document was prepared using Dragon voice recognition software and may include unintentional dictation errors.   Georga Hacking, MD 06/26/22 2234

## 2022-06-26 NOTE — H&P (Addendum)
[place]   PATIENT NAME: Nicholas [name]    MR#:  [id number]  DATE OF BIRTH:  11/16/1945  DATE OF ADMISSION:  06/26/2022  PRIMARY CARE PHYSICIAN: Danella Penton, MD   Patient is coming from: Home  REQUESTING/REFERRING PHYSICIAN: Georga Hacking, MD  CHIEF COMPLAINT:   Chief Complaint  Patient presents with   Leg Pain    HISTORY OF PRESENT ILLNESS:  Nicholas [name] is a 77 y.o. male with medical history significant for paroxysmal atrial fibrillation, gout, PE on Xarelto and TIA, who presented to the emergency room with acute onset of worsening right second toe swelling and erythema and extension to his right leg as well as calf with mild warmth and tenderness over the last few days.  He admitted to shaking chills today.  No nausea or vomiting or diarrhea or abdominal pain.  He has been seeing occasional bleeding from his right second toe the last couple of days.  No chest pain or dyspnea or cough or wheezing.  No dysuria, oliguria or hematuria or flank pain.   ED Course: When he came to the ER, BP was 109/55 then 85/52 with a MAP of 61 and later 107/63 with a MAP of 77 and temperature was 97.9 and later 99.6.  Heart rate was normal and later 111.  CMP revealed a creatinine 1.25 and total bili 1.6.  Lactic acid was 1.5 and later 1.7.  CBC showed leukocytosis 17.  Imaging: None.  The patient was given 2 g of IV Rocephin and 1 L bolus of IV lactated Ringer.  He will be admitted to medical telemetry bed for further evaluation and management. PAST MEDICAL HISTORY:   Past Medical History:  Diagnosis Date   A-fib [facility])    Cancer (HCC) 10/25/2019   Prostate   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   Dysrhythmia    Pulmonary embolism (HCC)    Skin cancer    TIA (transient ischemic attack)     PAST SURGICAL HISTORY:   Past Surgical History:  Procedure Laterality Date   APPENDECTOMY     COLONOSCOPY WITH PROPOFOL N/A 11/23/2019   Procedure: COLONOSCOPY WITH PROPOFOL;   Surgeon: Toledo, Boykin Nearing, MD;  Location: [facility] ENDOSCOPY;  Service: Gastroenterology;  Laterality: N/A;   cyber knife surgery     PROSTATE SURGERY      SOCIAL HISTORY:   Social History   Tobacco Use   Smoking status: Never   Smokeless tobacco: Never  Substance Use Topics   Alcohol use: Never    FAMILY HISTORY:   Family History  Problem Relation Age of Onset   Hypertension Mother     DRUG ALLERGIES:   Allergies  Allergen Reactions   Penicillins Other (See Comments)   Allopurinol Hives    REVIEW OF SYSTEMS:   ROS As per history of present illness. All pertinent systems were reviewed above. Constitutional, HEENT, cardiovascular, respiratory, GI, GU, musculoskeletal, neuro, psychiatric, endocrine, integumentary and hematologic systems were reviewed and are otherwise negative/unremarkable except for positive findings mentioned above in the HPI.   MEDICATIONS AT HOME:   Prior to Admission medications   Medication Sig Start Date End Date Taking? Authorizing Provider  aspirin EC 81 MG tablet Take 81 mg by mouth daily. Swallow whole.    [provider]  atenolol (TENORMIN) 25 MG tablet Take 12.5 mg by mouth 2 (two) times daily. 07/16/19   [provider]  colchicine 0.6 MG tablet Take 0.6 mg by mouth as  needed.    [provider]  febuxostat (ULORIC) 40 MG tablet Take 40 mg by mouth daily. 06/20/19   [provider]  hydrocortisone 2.5 % cream APPLY EXTERNALLY TO THE AFFECTED AREA EVERY DAY ON TUES, THURSDAY, SATURDAY AS DIRECTED 03/05/22   Deirdre Evener, MD  multivitamin-lutein [employer]) CAPS capsule Take 1 capsule by mouth daily.    [provider]  vitamin C (ASCORBIC ACID) 250 MG tablet Take 1 tablet (250 mg total) by mouth 2 (two) times daily. 02/23/21   Arnetha Courser, MD  XARELTO 20 MG TABS tablet Take 20 mg by mouth daily. 06/14/19   [provider]  zinc sulfate (ZINC-220) 220 (50 Zn) MG capsule Take 1  capsule (220 mg total) by mouth daily. 02/23/21   Arnetha Courser, MD      VITAL SIGNS:  Blood pressure (!) 85/52, pulse (!) 111, temperature 99.6 F (37.6 C), temperature source Oral, resp. rate 16, height  (1.93 m), weight 84.4 kg, SpO2 100 %.  PHYSICAL EXAMINATION:  Physical Exam  GENERAL:  77 y.o.-year-old Caucasian male patient lying in the bed with no acute distress.  EYES: Pupils equal, round, reactive to light and accommodation. No scleral icterus. Extraocular muscles intact.  HEENT: Head atraumatic, normocephalic. Oropharynx and nasopharynx clear.  NECK:  Supple, no jugular venous distention. No thyroid enlargement, no tenderness.  LUNGS: Normal breath sounds bilaterally, no wheezing, rales,rhonchi or crepitation. No use of accessory muscles of respiration.  CARDIOVASCULAR: Regular rate and rhythm, S1, S2 normal. No murmurs, rubs, or gallops.  ABDOMEN: Soft, nondistended, nontender. Bowel sounds present. No organomegaly or mass.  EXTREMITIES: No pedal edema, cyanosis, or clubbing.  NEUROLOGIC: Cranial nerves II through XII are intact. Muscle strength 5/5 in all extremities. Sensation intact. Gait not checked.  PSYCHIATRIC: The patient is alert and oriented x 3.  Normal affect and good eye contact. SKIN: Right second toe diffuse swelling and erythema with tenderness and warmth as well as right anterior leg and calf erythema, warmth, redness and tenderness.  LABORATORY PANEL:   CBC Recent Labs  Lab 06/26/22 2026  WBC 17.0*  HGB 15.3  HCT 45.7  PLT 216   ------------------------------------------------------------------------------------------------------------------  Chemistries  Recent Labs  Lab 06/26/22 2026  NA 136  K 4.2  CL 103  CO2 26  GLUCOSE 115*  BUN 14  CREATININE 1.25*  CALCIUM 9.4  AST 28  ALT 20  ALKPHOS 56  BILITOT 1.6*    ------------------------------------------------------------------------------------------------------------------  Cardiac Enzymes No results for input(s): "TROPONINI" in the last 168 hours. ------------------------------------------------------------------------------------------------------------------  RADIOLOGY:  US Venous Img Lower Unilateral Right  Result Date: 06/26/2022 CLINICAL DATA:  Suspected right lower extremity DVT. EXAM: RIGHT LOWER EXTREMITY VENOUS DOPPLER ULTRASOUND TECHNIQUE: Gray-scale sonography with compression, as well as color and duplex ultrasound, were performed to evaluate the deep venous system(s) from the level of the common femoral vein through the popliteal and proximal calf veins. COMPARISON:  None Available. FINDINGS: VENOUS Normal compressibility of the common femoral, superficial femoral, and popliteal veins, as well as the visualized calf veins. Visualized portions of profunda femoral vein and great saphenous vein unremarkable. No filling defects to suggest DVT on grayscale or color Doppler imaging. Doppler waveforms show normal direction of venous flow, normal respiratory plasticity and response to augmentation. Limited views of the contralateral common femoral vein are unremarkable. OTHER There are fatty replaced benign-appearing right inguinal chain nodes. Largest of these is 2.7 x 1.7 x 3.9 cm. Limitations: none IMPRESSION: No evidence of right  lower extremity DVT. Fatty replaced inguinal chain nodes. Electronically Signed   By: Almira Bar M.D.   On: 06/26/2022 23:30      IMPRESSION AND PLAN:  Assessment and Plan: * Sepsis due to cellulitis - The patient will be admitted to a medical telemetry bed. - Sepsis manifested by leukocytosis and tachycardia. - This is secondary to right second toe and leg cellulitis. - We will continue antibiotic therapy with IV Rocephin. - Warm compresses will be applied. - We will follow blood cultures. - Pain  management will be provided. - We will obtain podiatry consult. - I notified Dr. Excell Seltzer about the patient.  Paroxysmal atrial fibrillation - We will continue Xarelto and atenolol.  History of TIA (transient ischemic attack) - We will continue baby aspirin.  Peripheral neuropathy - We will continue Neurontin.  Gout - We will continue Uloric and as needed colchicine.   DVT prophylaxis: Lovenox.  Advanced Care Planning:  Code Status: full code.  Family Communication:  The plan of care was discussed in details with the patient (and family). I answered all questions. The patient agreed to proceed with the above mentioned plan. Further management will depend upon hospital course. Disposition Plan: Back to previous home environment Consults called: none.  All the records are reviewed and case discussed with ED provider.  Status is: Inpatient   At the time of the admission, it appears that the appropriate admission status for this patient is inpatient.  This is judged to be reasonable and necessary in order to provide the required intensity of service to ensure the patient's safety given the presenting symptoms, physical exam findings and initial radiographic and laboratory data in the context of comorbid conditions.  The patient requires inpatient status due to high intensity of service, high risk of further deterioration and high frequency of surveillance required.  I certify that at the time of admission, it is my clinical judgment that the patient will require inpatient hospital care extending more than 2 midnights.                            Dispo: The patient is from: Home              Anticipated d/c is to: Home              Patient currently is not medically stable to d/c.              Difficult to place patient: No  Hannah Beat M.D on 06/27/2022 at 2:16 AM  [facility]   From 7 PM-7 AM, contact night-coverage [email or web address]  CC: Primary care physician; Danella Penton,  MD

## 2022-06-26 NOTE — ED Triage Notes (Signed)
Pt to ED via EMS from home, pt has an infected right 2nd toe with redness going up his leg. Pt has been seen and treated with abx for this infection recently by pcp, per ems pcp wants to amputate toe but pt does not. Pt states when he walks he has bleeding to the toe.

## 2022-06-27 ENCOUNTER — Encounter: Payer: Self-pay | Admitting: Family Medicine

## 2022-06-27 ENCOUNTER — Other Ambulatory Visit: Payer: Self-pay

## 2022-06-27 ENCOUNTER — Inpatient Hospital Stay: Payer: Medicare PPO

## 2022-06-27 DIAGNOSIS — M109 Gout, unspecified: Secondary | ICD-10-CM | POA: Insufficient documentation

## 2022-06-27 DIAGNOSIS — Z8673 Personal history of transient ischemic attack (TIA), and cerebral infarction without residual deficits: Secondary | ICD-10-CM

## 2022-06-27 DIAGNOSIS — I480 Paroxysmal atrial fibrillation: Secondary | ICD-10-CM

## 2022-06-27 DIAGNOSIS — L03031 Cellulitis of right toe: Secondary | ICD-10-CM

## 2022-06-27 DIAGNOSIS — G629 Polyneuropathy, unspecified: Secondary | ICD-10-CM

## 2022-06-27 DIAGNOSIS — A419 Sepsis, unspecified organism: Secondary | ICD-10-CM | POA: Diagnosis not present

## 2022-06-27 DIAGNOSIS — L03115 Cellulitis of right lower limb: Secondary | ICD-10-CM

## 2022-06-27 DIAGNOSIS — L0390 Cellulitis, unspecified: Secondary | ICD-10-CM | POA: Diagnosis not present

## 2022-06-27 LAB — CBC
HCT: 39.3 % (ref 39.0–52.0)
Hemoglobin: 13.3 g/dL (ref 13.0–17.0)
MCH: 32.5 pg (ref 26.0–34.0)
MCHC: 33.8 g/dL (ref 30.0–36.0)
MCV: 96.1 fL (ref 80.0–100.0)
Platelets: 182 10*3/uL (ref 150–400)
RBC: 4.09 MIL/uL — ABNORMAL LOW (ref 4.22–5.81)
RDW: 13 % (ref 11.5–15.5)
WBC: 9.9 10*3/uL (ref 4.0–10.5)
nRBC: 0 % (ref 0.0–0.2)

## 2022-06-27 LAB — BASIC METABOLIC PANEL
Anion gap: 7 (ref 5–15)
BUN: 16 mg/dL (ref 8–23)
CO2: 25 mmol/L (ref 22–32)
Calcium: 8.7 mg/dL — ABNORMAL LOW (ref 8.9–10.3)
Chloride: 104 mmol/L (ref 98–111)
Creatinine, Ser: 1.09 mg/dL (ref 0.61–1.24)
GFR, Estimated: >60 mL/min (ref >60)
Glucose, Bld: 98 mg/dL (ref 70–99)
Potassium: 3.6 mmol/L (ref 3.5–5.1)
Sodium: 136 mmol/L (ref 135–145)

## 2022-06-27 LAB — AEROBIC/ANAEROBIC CULTURE W GRAM STAIN (SURGICAL/DEEP WOUND): Gram Stain: NONE SEEN

## 2022-06-27 LAB — LACTIC ACID, PLASMA
Lactic Acid, Venous: 1.7 mmol/L (ref 0.5–1.9)
Lactic Acid, Venous: 1.6 mmol/L (ref 0.5–1.9)

## 2022-06-27 LAB — MAGNESIUM: Magnesium: 1.8 mg/dL (ref 1.7–2.4)

## 2022-06-27 LAB — PROCALCITONIN: Procalcitonin: 2.82 ng/mL

## 2022-06-27 LAB — CULTURE, BLOOD (ROUTINE X 2)

## 2022-06-27 LAB — PROTIME-INR
INR: 2.1 — ABNORMAL HIGH (ref 0.8–1.2)
Prothrombin Time: 23.6 seconds — ABNORMAL HIGH (ref 11.4–15.2)

## 2022-06-27 LAB — PHOSPHORUS: Phosphorus: 3.2 mg/dL (ref 2.5–4.6)

## 2022-06-27 MED ORDER — PROSIGHT PO TABS
1.0000 | ORAL_TABLET | Freq: Every day | ORAL | Status: DC
  Administered 2022-06-27 – 2022-06-30 (×3): 1 via ORAL
  Filled 2022-06-27 (×4): qty 1

## 2022-06-27 MED ORDER — SODIUM CHLORIDE 0.9 % IV SOLN: INTRAVENOUS | Status: DC | PRN

## 2022-06-27 MED ORDER — GABAPENTIN 100 MG PO CAPS
200.0000 mg | ORAL_CAPSULE | Freq: Two times a day (BID) | ORAL | Status: DC
  Administered 2022-06-27 – 2022-06-30 (×6): 200 mg via ORAL
  Filled 2022-06-27 (×6): qty 2

## 2022-06-27 MED ORDER — RIVAROXABAN 20 MG PO TABS
20.0000 mg | ORAL_TABLET | Freq: Once | ORAL | Status: AC
  Administered 2022-06-27: 20 mg via ORAL
  Filled 2022-06-27: qty 1

## 2022-06-27 MED ORDER — CLINDAMYCIN PHOSPHATE 900 MG/50ML IV SOLN
900.0000 mg | INTRAVENOUS | Status: AC
  Administered 2022-06-28: 900 mg via INTRAVENOUS
  Filled 2022-06-27: qty 50

## 2022-06-27 MED ORDER — RIVAROXABAN 20 MG PO TABS
20.0000 mg | ORAL_TABLET | Freq: Every day | ORAL | Status: DC
  Administered 2022-06-27 – 2022-06-30 (×3): 20 mg via ORAL
  Filled 2022-06-27 (×5): qty 1

## 2022-06-27 NOTE — Assessment & Plan Note (Addendum)
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 - Sepsis manifested by leukocytosis and tachycardia. - This is secondary to right second toe and leg cellulitis. - We will continue antibiotic therapy with IV Rocephin. - Warm compresses will be applied. - We will follow blood cultures. - Pain management will be provided. - We will obtain podiatry consult. - I notified Dr. Excell Seltzer about the patient.

## 2022-06-27 NOTE — ED Notes (Signed)
Patient resting comfortably with eyes closed. Respirations even and unlabored NAD noted. 

## 2022-06-27 NOTE — ED Notes (Signed)
Breakfast tray at bedside 

## 2022-06-27 NOTE — ED Notes (Signed)
Patient assisted back in to bed from bed side commode. Patient unable to go at this time.

## 2022-06-27 NOTE — Assessment & Plan Note (Addendum)
-   We will continue Xarelto and atenolol.

## 2022-06-27 NOTE — Progress Notes (Signed)
Patient has his home CPAP at bedside with his personal belongings, refusing to wear at this time.

## 2022-06-27 NOTE — Consult Note (Addendum)
PODIATRY / FOOT AND ANKLE SURGERY CONSULTATION NOTE  Requesting Physician: Dr. Lucianne Muss  Reason for consult: Right foot infection/swelling  Chief Complaint: Right foot infection/leg swelling and redness   HPI: Nicholas [name] is a 77 y.o. male who presents with a wound to the distal tip of his right toe as well as swelling and redness to the right toe and additionally has swelling and redness to the right lower leg.  Patient called the call service line yesterday and was noted to have the symptoms.  Patient was instructed to go to the emergency room for further workup.  Patient is currently be admitted due to this issue.  Patient currently denies nausea, vomiting, fevers, chills.  Patient does state that he has a fair amount of numbness to his bilateral lower extremities.  He does regularly follow-up with Dr. Alberteen Spindle in an outpatient setting.  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 He has never used smokeless tobacco. He reports that he does not drink alcohol and does not use drugs.  Allergies:  Allergies  Allergen Reactions   Penicillins Other (See Comments)   Allopurinol Hives    (Not in a hospital admission)   Physical Exam: General: Alert and oriented.  No apparent distress.  Vascular: DP/PT pulses palpable bilateral, capillary fill time intact to digits  bilaterally.  Erythema and edema present to the right lower leg at the calf area and to the ankle.  Patient also appears to have separate area of erythema and edema present to the right second toe that does not appear to connect to the ankle and lower leg.  Neuro: Light touch sensation nearly absent to bilateral lower extremities.  Derm: Superficial ulceration present to the distal tip of the right second toe, no bone exposed at this time, right second toe appears to be as a sausage type digit and appears to be swollen and erythematous today.  No other open lesions or ulcerations present to bilateral lower extremities.  Skin appears to be thin and atrophic to both feet.  MSK: 4/5 strength bilateral lower extremity muscle groups.  Results for orders placed or performed during the hospital encounter of 06/26/22 (from the past 48 hour(s))  CBC with Differential     Status: Abnormal   Collection Time: 06/26/22  8:26 PM  Result Value Ref Range   WBC 17.0 (H) 4.0 - 10.5 K/uL   RBC 4.74 4.22 - 5.81 MIL/uL   Hemoglobin 15.3 13.0 - 17.0 g/dL   HCT 53.2 99.2 - 42.6 %   MCV 96.4 80.0 - 100.0 fL   MCH 32.3 26.0 - 34.0 pg   MCHC 33.5 30.0 - 36.0 g/dL   RDW 83.4 19.6 - 22.2 %   Platelets 216 150 - 400 K/uL  nRBC 0.0 0.0 - 0.2 %   Neutrophils Relative % 86 %   Neutro Abs 14.7 (H) 1.7 - 7.7 K/uL   Lymphocytes Relative 7 %   Lymphs Abs 1.2 0.7 - 4.0 K/uL   Monocytes Relative 6 %   Monocytes Absolute 1.0 0.1 - 1.0 K/uL   Eosinophils Relative 0 %   Eosinophils Absolute 0.0 0.0 - 0.5 K/uL   Basophils Relative 0 %   Basophils Absolute 0.1 0.0 - 0.1 K/uL   Immature Granulocytes 1 %   Abs Immature Granulocytes 0.08 (H) 0.00 - 0.07 K/uL    Comment: Performed at [facility], [address] [place], [place] [address]  Comprehensive metabolic panel     Status: Abnormal   Collection Time: 06/26/22  8:26 PM  Result Value Ref Range   Sodium 136 135 - 145 mmol/L   Potassium 4.2 3.5 - 5.1 mmol/L    Chloride 103 98 - 111 mmol/L   CO2 26 22 - 32 mmol/L   Glucose, Bld 115 (H) 70 - 99 mg/dL    Comment: Glucose reference range applies only to samples taken after fasting for at least 8 hours.   BUN 14 8 - 23 mg/dL   Creatinine, Ser 1.91 (H) 0.61 - 1.24 mg/dL   Calcium 9.4 8.9 - 47.8 mg/dL   Total Protein 6.6 6.5 - 8.1 g/dL   Albumin 4.0 3.5 - 5.0 g/dL   AST 28 15 - 41 U/L   ALT 20 0 - 44 U/L   Alkaline Phosphatase 56 38 - 126 U/L   Total Bilirubin 1.6 (H) 0.3 - 1.2 mg/dL   GFR, Estimated 59 (L) >60 mL/min    Comment: (NOTE) Calculated using the CKD-EPI Creatinine Equation (2021)    Anion gap 7 5 - 15    Comment: Performed at [facility], [address] [place], [place] [address]  Lactic acid, plasma     Status: None   Collection Time: 06/26/22  8:26 PM  Result Value Ref Range   Lactic Acid, Venous 1.5 0.5 - 1.9 mmol/L    Comment: Performed at [facility], [address] [place], [place] [address]  Urinalysis, Routine w reflex microscopic -Urine, Clean Catch     Status: Abnormal   Collection Time: 06/26/22  9:11 PM  Result Value Ref Range   Color, Urine AMBER (A) YELLOW    Comment: BIOCHEMICALS MAY BE AFFECTED BY COLOR   APPearance CLEAR (A) CLEAR   Specific Gravity, Urine 1.028 1.005 - 1.030   pH 7.0 5.0 - 8.0   Glucose, UA NEGATIVE NEGATIVE mg/dL   Hgb urine dipstick MODERATE (A) NEGATIVE   Bilirubin Urine NEGATIVE NEGATIVE   Ketones, ur NEGATIVE NEGATIVE mg/dL   Protein, ur 30 (A) NEGATIVE mg/dL   Nitrite NEGATIVE NEGATIVE   Leukocytes,Ua NEGATIVE NEGATIVE   RBC / HPF 21-50 0 - 5 RBC/hpf   WBC, UA 0-5 0 - 5 WBC/hpf   Bacteria, UA NONE SEEN NONE SEEN   Squamous Epithelial / HPF 0-5 0 - 5 /HPF   Mucus PRESENT     Comment: Performed at [facility], [address] [place], [place] [address]  Blood culture (routine x 2)     Status: None (Preliminary result)   Collection Time: 06/26/22 11:10 PM   Specimen: BLOOD  Result Value Ref  Range   Specimen Description BLOOD LEFT ARM    Special Requests      BOTTLES DRAWN AEROBIC AND ANAEROBIC Blood Culture  adequate volume   Culture      NO GROWTH < 12 HOURS Performed at [facility], [address] [place], [place] [address]    Report Status PENDING   Blood culture (routine x 2)     Status: None (Preliminary result)   Collection Time: 06/26/22 11:15 PM   Specimen: BLOOD  Result Value Ref Range   Specimen Description BLOOD RIGHT ARM    Special Requests      BOTTLES DRAWN AEROBIC AND ANAEROBIC Blood Culture adequate volume   Culture      NO GROWTH < 12 HOURS Performed at [facility], [address] [place], [place] [address]    Report Status PENDING   Lactic acid, plasma     Status: None   Collection Time: 06/27/22  1:05 AM  Result Value Ref Range   Lactic Acid, Venous 1.7 0.5 - 1.9 mmol/L    Comment: Performed at [facility], [address] [place], [place] [address]  Procalcitonin     Status: None   Collection Time: 06/27/22  8:34 AM  Result Value Ref Range   Procalcitonin 2.82 ng/mL    Comment:        Interpretation: PCT > 2 ng/mL: Systemic infection (sepsis) is likely, unless other causes are known. (NOTE)       Sepsis PCT Algorithm           Lower Respiratory Tract                                      Infection PCT Algorithm    ----------------------------     ----------------------------         PCT < 0.25 ng/mL                PCT < 0.10 ng/mL          Strongly encourage             Strongly discourage   discontinuation of antibiotics    initiation of antibiotics    ----------------------------     -----------------------------       PCT 0.25 - 0.50 ng/mL            PCT 0.10 - 0.25 ng/mL               OR       >80% decrease in PCT            Discourage initiation of                                            antibiotics      Encourage discontinuation           of antibiotics    ----------------------------      -----------------------------         PCT >= 0.50 ng/mL              PCT 0.26 - 0.50 ng/mL               AND       <80% decrease in PCT              Encourage initiation of  antibiotics       Encourage continuation           of antibiotics    ----------------------------     -----------------------------        PCT >= 0.50 ng/mL                  PCT > 0.50 ng/mL               AND         increase in PCT                  Strongly encourage                                      initiation of antibiotics    Strongly encourage escalation           of antibiotics                                     -----------------------------                                           PCT <= 0.25 ng/mL                                                 OR                                        > 80% decrease in PCT                                      Discontinue / Do not initiate                                             antibiotics  Performed at [facility], [address], [place], [place] [address]   Basic metabolic panel     Status: Abnormal   Collection Time: 06/27/22  8:34 AM  Result Value Ref Range   Sodium 136 135 - 145 mmol/L   Potassium 3.6 3.5 - 5.1 mmol/L   Chloride 104 98 - 111 mmol/L   CO2 25 22 - 32 mmol/L   Glucose, Bld 98 70 - 99 mg/dL    Comment: Glucose reference range applies only to samples taken after fasting for at least 8 hours.   BUN 16 8 - 23 mg/dL   Creatinine, Ser 6.04 0.61 - 1.24 mg/dL   Calcium 8.7 (L) 8.9 - 10.3 mg/dL   GFR, Estimated >54 >09 mL/min    Comment: (NOTE) Calculated using the CKD-EPI Creatinine Equation (2021)    Anion gap 7 5 - 15    Comment: Performed at [facility], [address], [place], [place] [address]  CBC  Status: Abnormal   Collection Time: 06/27/22  8:34 AM  Result Value Ref Range   WBC 9.9 4.0 - 10.5 K/uL   RBC 4.09 (L) 4.22 - 5.81 MIL/uL   Hemoglobin 13.3 13.0 -  17.0 g/dL   HCT 16.1 09.6 - 04.5 %   MCV 96.1 80.0 - 100.0 fL   MCH 32.5 26.0 - 34.0 pg   MCHC 33.8 30.0 - 36.0 g/dL   RDW 40.9 81.1 - 91.4 %   Platelets 182 150 - 400 K/uL   nRBC 0.0 0.0 - 0.2 %    Comment: Performed at [facility], [address] [place], [place] [address]  Magnesium     Status: None   Collection Time: 06/27/22  8:34 AM  Result Value Ref Range   Magnesium 1.8 1.7 - 2.4 mg/dL    Comment: Performed at [facility], [address], [place], [place] [address]  Phosphorus     Status: None   Collection Time: 06/27/22  8:34 AM  Result Value Ref Range   Phosphorus 3.2 2.5 - 4.6 mg/dL    Comment: Performed at [facility], [address] [place], [place] [address]   DG Foot 2 Views Right  Result Date: 06/27/2022 CLINICAL DATA:  Right foot cellulitis.  [id number]. EXAM: RIGHT FOOT - 2 VIEW COMPARISON:  None Available. FINDINGS: There is moderate generalized edema. There is no appreciable soft tissue gas. No foreign body is seen. There are scattered vascular calcifications in the posterior tibial artery. There are faint vascular calcifications in the forefoot. The bone mineralization is normal. No fractures or destructive lesions are seen. There are mild features of degenerative arthrosis in the forefoot, on the lateral view mild midfoot arthrosis and a small plantar calcaneal spur. There are small rounded dystrophic calcifications in the plantar fascial plane. There is a tiny dystrophic calcification medial to the first MTP joint. IMPRESSION: 1. Moderate generalized edema. No soft tissue gas or visible foreign body. 2. Vascular calcifications. 3. Mild degenerative changes and small plantar calcaneal spur. Electronically Signed   By: Almira Bar M.D.   On: 06/27/2022 06:41   US Venous Img Lower Unilateral Right  Result Date: 06/26/2022 CLINICAL DATA:  Suspected right lower extremity DVT. EXAM: RIGHT LOWER EXTREMITY VENOUS DOPPLER ULTRASOUND TECHNIQUE:  Gray-scale sonography with compression, as well as color and duplex ultrasound, were performed to evaluate the deep venous system(s) from the level of the common femoral vein through the popliteal and proximal calf veins. COMPARISON:  None Available. FINDINGS: VENOUS Normal compressibility of the common femoral, superficial femoral, and popliteal veins, as well as the visualized calf veins. Visualized portions of profunda femoral vein and great saphenous vein unremarkable. No filling defects to suggest DVT on grayscale or color Doppler imaging. Doppler waveforms show normal direction of venous flow, normal respiratory plasticity and response to augmentation. Limited views of the contralateral common femoral vein are unremarkable. OTHER There are fatty replaced benign-appearing right inguinal chain nodes. Largest of these is 2.7 x 1.7 x 3.9 cm. Limitations: none IMPRESSION: No evidence of right lower extremity DVT. Fatty replaced inguinal chain nodes. Electronically Signed   By: Almira Bar M.D.   On: 06/26/2022 23:30    Blood pressure 111/73, pulse 80, temperature 99 F (37.2 C), temperature source Oral, resp. rate 18, height  (1.93 m), weight 84.4 kg, SpO2 92 %.  Assessment R 2nd toe cellulitis with associated DFU 2nd toe distal tip RLE cellulitis DM2 polyneuropathy  Plan -Patient seen  and examined. -X-ray reviewed, no signs of osteomyelitis. -Wound debridement performed right second toe as described below.  Patient tolerated procedure well. -Applied Betadine dressing to the right second toe followed by 4 x 4 gauze, Kerlix and Ace wrap to below the knee. -MRI ordered of right foot without contrast for further assessment of second toe with chronic wound to the area. -Wound culture taken to right second toe and sent off.  Appreciate medicine recommendations for antibiotic therapy. -MRI results reviewed with patient in detail.  Appears to show osteomyelitis to the right second toe distal tip.   Discussed treatment options. -All treatment options were discussed with the patient both conservative and surgical attempts at correction include potential risks and complications at this time patient is elected for surgical intervention consisting of partial amputation right second toe.  Will call operating room for OR time.  Plan on performing at some point tomorrow morning 06/28/2022.  Patient to likely be n.p.o. at midnight.  Will place orders.  All questions answered, no guarantees given.  Rosetta Posner, DPM 06/27/2022, 1:40 PM

## 2022-06-27 NOTE — Progress Notes (Signed)
[facility] Progress Note  Patient: Nicholas [name]  DOA: 06/26/2022     Date of Service: the patient was seen and examined on 06/27/2022  Chief Complaint  Patient presents with   Leg Pain   Brief hospital course: Nicholas [name] is a 78 y.o. male with medical history significant for paroxysmal atrial fibrillation, gout, PE on Xarelto and TIA, who presented to the emergency room with acute onset of worsening right second toe swelling and erythema and extension to his right leg as well as calf with mild warmth and tenderness over the last few days.  He admitted to shaking chills today.  No nausea or vomiting or diarrhea or abdominal pain.  He has been seeing occasional bleeding from his right second toe the last couple of days.  No chest pain or dyspnea or cough or wheezing.  No dysuria, oliguria or hematuria or flank pain.    ED Course: When he came to the ER, BP was 109/55 then 85/52 with a MAP of 61 and later 107/63 with a MAP of 77 and temperature was 97.9 and later 99.6.  Heart rate was normal and later 111.  CMP revealed a creatinine 1.25 and total bili 1.6.  Lactic acid was 1.5 and later 1.7.  CBC showed leukocytosis 17.   Imaging: Rt foot Xray: Moderate generalized edema. No soft tissue gas or visible foreign body. Vascular calcifications. Mild degenerative changes and small plantar calcaneal spur. Right LE venous duplex: No evidence of right lower extremity DVT. Fatty replaced inguinal chain nodes.    The patient was given 2 g of IV Rocephin and 1 L bolus of IV lactated Ringer.  He will be admitted to medical telemetry bed for further evaluation and management.   Assessment and Plan: Sepsis due to cellulitis - Sepsis manifested by leukocytosis and tachycardia. - This is secondary to right second toe and leg cellulitis. - continue IV Rocephin. - follow blood cultures. - Continue prn pain management  Right foot MRI: 1. Soft tissue swelling in the 2nd toe  with possible skin ulceration distally. Correlate clinically. 2. Marrow T2 hyperintensity within the distal 2nd phalanx with possible small erosion of the distal tuft, suspicious for early Osteomyelitis. 3. The other toes and metatarsals appear normal. 4. Nonspecific dorsal subcutaneous edema throughout the forefoot. No focal fluid collection. Podiatrist consulted, wound debridement done at bedside, definitive treatment discussed and patient agreed for partial amputation of right second toe.  As per podiatry most likely it will be done tomorrow on 06/28/2022  Keep n.p.o. after midnight  Paroxysmal atrial fibrillation - continue Xarelto and atenolol.   History of TIA (transient ischemic attack) - continue aspirin 81 mg pod   Peripheral neuropathy - continue Neurontin.   Gout - continue Uloric and as needed colchicine.    Body mass index is 22.64 kg/m.  Interventions:    Diet: Heart healthy diet DVT Prophylaxis: Therapeutic Anticoagulation with Xarelto    Advance goals of care discussion: Full code  Family Communication: family was not present at bedside, at the time of interview.  The pt provided permission to discuss medical plan with the family. Opportunity was given to ask question and all questions were answered satisfactorily.   Disposition:  Pt is from Home, admitted with right second toe infection, still on IV Abx, which precludes a safe discharge. Discharge to Home, when stable and cleared by podiatry.  Subjective: No significant events overnight, pain is under control.  Patient denies any  complaints.  Resting comfortably.  Physical Exam: General: NAD, lying comfortably Appear in no distress, affect appropriate Eyes: PERRLA ENT: Oral Mucosa Clear, moist  Neck: no JVD,  Cardiovascular: S1 and S2 Present, no Murmur,  Respiratory: good respiratory effort, Bilateral Air entry equal and Decreased, no Crackles, no wheezes Abdomen: Bowel Sound present, Soft and no  tenderness,  Skin: no rashes Extremities: no Pedal edema, no calf tenderness, right second toe edematous and erythematous, no tenderness. Neurologic: without any new focal findings Gait not checked due to patient safety concerns  Vitals:   06/27/22 1000 06/27/22 1003 06/27/22 1130 06/27/22 1300  BP: 103/71  107/67 111/73  Pulse: 70  66 80  Resp: Temp:  99 F (37.2 C)    TempSrc:  Oral    SpO2: 94%  96% 92%  Weight:      Height:        Intake/Output Summary (Last 24 hours) at 06/27/2022 1453 Last data filed at 06/27/2022 1008 Gross per 24 hour  Intake 2457.02 ml  Output --  Net 2457.02 ml   Filed Weights   06/26/22 2024  Weight: 84.4 kg    Data Reviewed: I have personally reviewed and interpreted daily labs, tele strips, imagings as discussed above. I reviewed all nursing notes, pharmacy notes, vitals, pertinent old records I have discussed plan of care as described above with RN and patient/family.  CBC: Recent Labs  Lab 06/26/22 2026 06/27/22 0834  WBC 17.0* 9.9  NEUTROABS 14.7*  --   HGB 15.3 13.3  HCT 45.7 39.3  MCV 96.4 96.1  PLT 216 182   Basic Metabolic Panel: Recent Labs  Lab 06/26/22 2026 06/27/22 0834  NA 136 136  K 4.2 3.6  CL 103 104  CO2 26 25  GLUCOSE 115* 98  BUN 14 16  CREATININE 1.25* 1.09  CALCIUM 9.4 8.7*  MG  --  1.8  PHOS  --  3.2    Studies: DG Foot 2 Views Right  Result Date: 06/27/2022 CLINICAL DATA:  Right foot cellulitis.  [id number]. EXAM: RIGHT FOOT - 2 VIEW COMPARISON:  None Available. FINDINGS: There is moderate generalized edema. There is no appreciable soft tissue gas. No foreign body is seen. There are scattered vascular calcifications in the posterior tibial artery. There are faint vascular calcifications in the forefoot. The bone mineralization is normal. No fractures or destructive lesions are seen. There are mild features of degenerative arthrosis in the forefoot, on the lateral view mild midfoot arthrosis  and a small plantar calcaneal spur. There are small rounded dystrophic calcifications in the plantar fascial plane. There is a tiny dystrophic calcification medial to the first MTP joint. IMPRESSION: 1. Moderate generalized edema. No soft tissue gas or visible foreign body. 2. Vascular calcifications. 3. Mild degenerative changes and small plantar calcaneal spur. Electronically Signed   By: Almira Bar M.D.   On: 06/27/2022 06:41   US Venous Img Lower Unilateral Right  Result Date: 06/26/2022 CLINICAL DATA:  Suspected right lower extremity DVT. EXAM: RIGHT LOWER EXTREMITY VENOUS DOPPLER ULTRASOUND TECHNIQUE: Gray-scale sonography with compression, as well as color and duplex ultrasound, were performed to evaluate the deep venous system(s) from the level of the common femoral vein through the popliteal and proximal calf veins. COMPARISON:  None Available. FINDINGS: VENOUS Normal compressibility of the common femoral, superficial femoral, and popliteal veins, as well as the visualized calf veins. Visualized portions of profunda femoral vein and great saphenous vein unremarkable. No filling  defects to suggest DVT on grayscale or color Doppler imaging. Doppler waveforms show normal direction of venous flow, normal respiratory plasticity and response to augmentation. Limited views of the contralateral common femoral vein are unremarkable. OTHER There are fatty replaced benign-appearing right inguinal chain nodes. Largest of these is 2.7 x 1.7 x 3.9 cm. Limitations: none IMPRESSION: No evidence of right lower extremity DVT. Fatty replaced inguinal chain nodes. Electronically Signed   By: Almira Bar M.D.   On: 06/26/2022 23:30    Scheduled Meds:  vitamin C  250 mg Oral BID   aspirin EC  81 mg Oral Daily   [START ON 06/28/2022] atenolol  12.5 mg Oral BID   febuxostat  40 mg Oral Daily   gabapentin  200 mg Oral BID   multivitamin  1 tablet Oral Daily   rivaroxaban  20 mg Oral Daily   zinc sulfate  220 mg  Oral Daily   Continuous Infusions:  cefTRIAXone (ROCEPHIN)  IV     lactated ringers 150 mL/hr (06/27/22 1008)   PRN Meds: acetaminophen **OR** acetaminophen, colchicine, magnesium hydroxide, ondansetron **OR** ondansetron (ZOFRAN) IV, traZODone  Time spent: 35 minutes  Author: Gillis Santa. MD [facility] 06/27/2022 2:53 PM  To reach On-call, see care teams to locate the attending and reach out to them via [email or web address]. If 7PM-7AM, please contact night-coverage If you still have difficulty reaching the attending provider, please page the [facility] (Director on Call) for [facility] on amion for assistance.

## 2022-06-27 NOTE — Assessment & Plan Note (Signed)
-   We will continue baby aspirin.

## 2022-06-27 NOTE — ED Notes (Signed)
Patient doing MRI screening at this time. 

## 2022-06-27 NOTE — Assessment & Plan Note (Signed)
-   We will continue Neurontin. ?

## 2022-06-27 NOTE — Assessment & Plan Note (Signed)
-   We will continue Uloric and as needed colchicine.

## 2022-06-28 ENCOUNTER — Other Ambulatory Visit: Payer: Self-pay

## 2022-06-28 ENCOUNTER — Encounter
Admission: EM | Disposition: A | Discharge status: Home Health | Payer: Self-pay | Source: Home / Self Care | Attending: Internal Medicine

## 2022-06-28 ENCOUNTER — Inpatient Hospital Stay: Payer: Medicare PPO | Admitting: Anesthesiology

## 2022-06-28 ENCOUNTER — Inpatient Hospital Stay: Payer: Medicare PPO

## 2022-06-28 DIAGNOSIS — L0390 Cellulitis, unspecified: Secondary | ICD-10-CM | POA: Diagnosis not present

## 2022-06-28 DIAGNOSIS — A419 Sepsis, unspecified organism: Secondary | ICD-10-CM | POA: Diagnosis not present

## 2022-06-28 HISTORY — PX: AMPUTATION TOE: SHX6595

## 2022-06-28 LAB — CBC
HCT: 39.6 % (ref 39.0–52.0)
Hemoglobin: 13.3 g/dL (ref 13.0–17.0)
MCH: 32.2 pg (ref 26.0–34.0)
MCHC: 33.6 g/dL (ref 30.0–36.0)
MCV: 95.9 fL (ref 80.0–100.0)
Platelets: 161 10*3/uL (ref 150–400)
RBC: 4.13 MIL/uL — ABNORMAL LOW (ref 4.22–5.81)
RDW: 12.8 % (ref 11.5–15.5)
WBC: 6.9 10*3/uL (ref 4.0–10.5)
nRBC: 0 % (ref 0.0–0.2)

## 2022-06-28 LAB — BASIC METABOLIC PANEL WITH GFR
Anion gap: 8 (ref 5–15)
BUN: 17 mg/dL (ref 8–23)
CO2: 23 mmol/L (ref 22–32)
Calcium: 8.8 mg/dL — ABNORMAL LOW (ref 8.9–10.3)
Chloride: 107 mmol/L (ref 98–111)
Creatinine, Ser: 1.04 mg/dL (ref 0.61–1.24)
GFR, Estimated: >60 mL/min
Glucose, Bld: 93 mg/dL (ref 70–99)
Potassium: 3.9 mmol/L (ref 3.5–5.1)
Sodium: 138 mmol/L (ref 135–145)

## 2022-06-28 LAB — SURGICAL PCR SCREEN
MRSA, PCR: NEGATIVE
Staphylococcus aureus: POSITIVE — AB

## 2022-06-28 LAB — CORTISOL-AM, BLOOD: Cortisol - AM: 3.2 ug/dL — ABNORMAL LOW (ref 6.7–22.6)

## 2022-06-28 LAB — MAGNESIUM: Magnesium: 1.9 mg/dL (ref 1.7–2.4)

## 2022-06-28 LAB — PHOSPHORUS: Phosphorus: 3.7 mg/dL (ref 2.5–4.6)

## 2022-06-28 LAB — CULTURE, BLOOD (ROUTINE X 2): Special Requests: ADEQUATE

## 2022-06-28 LAB — AEROBIC/ANAEROBIC CULTURE W GRAM STAIN (SURGICAL/DEEP WOUND)

## 2022-06-28 SURGERY — AMPUTATION, TOE
Anesthesia: General | Site: Toe | Laterality: Right

## 2022-06-28 MED ORDER — BUPIVACAINE HCL 0.5 % IJ SOLN
INTRAMUSCULAR | Status: DC | PRN
  Administered 2022-06-28: 10 mL

## 2022-06-28 MED ORDER — MUPIROCIN 2 % EX OINT
1.0000 | TOPICAL_OINTMENT | Freq: Two times a day (BID) | CUTANEOUS | Status: DC
  Administered 2022-06-28 – 2022-06-30 (×5): 1 via NASAL
  Filled 2022-06-28: qty 22

## 2022-06-28 MED ORDER — OXYCODONE HCL 5 MG PO TABS: 5.0000 mg | ORAL_TABLET | Freq: Once | ORAL | Status: DC | PRN

## 2022-06-28 MED ORDER — 0.9 % SODIUM CHLORIDE (POUR BTL) OPTIME
TOPICAL | Status: DC | PRN
  Administered 2022-06-28: 500 mL

## 2022-06-28 MED ORDER — ACETAMINOPHEN 10 MG/ML IV SOLN
INTRAVENOUS | Status: AC
  Filled 2022-06-28: qty 100

## 2022-06-28 MED ORDER — LACTATED RINGERS IV SOLN: INTRAVENOUS | Status: DC | PRN

## 2022-06-28 MED ORDER — ONDANSETRON HCL 4 MG/2ML IJ SOLN: 4.0000 mg | Freq: Once | INTRAMUSCULAR | Status: DC | PRN

## 2022-06-28 MED ORDER — PHENYLEPHRINE HCL (PRESSORS) 10 MG/ML IV SOLN
INTRAVENOUS | Status: DC | PRN
  Administered 2022-06-28: 80 ug via INTRAVENOUS
  Administered 2022-06-28: 160 ug via INTRAVENOUS

## 2022-06-28 MED ORDER — FENTANYL CITRATE (PF) 100 MCG/2ML IJ SOLN: 25.0000 ug | INTRAMUSCULAR | Status: DC | PRN

## 2022-06-28 MED ORDER — PHENYLEPHRINE 80 MCG/ML (10ML) SYRINGE FOR IV PUSH (FOR BLOOD PRESSURE SUPPORT)
PREFILLED_SYRINGE | INTRAVENOUS | Status: AC
  Filled 2022-06-28: qty 10

## 2022-06-28 MED ORDER — PROPOFOL 1000 MG/100ML IV EMUL
INTRAVENOUS | Status: AC
  Filled 2022-06-28: qty 100

## 2022-06-28 MED ORDER — FENTANYL CITRATE (PF) 100 MCG/2ML IJ SOLN
INTRAMUSCULAR | Status: DC | PRN
  Administered 2022-06-28: 25 ug via INTRAVENOUS

## 2022-06-28 MED ORDER — POVIDONE-IODINE 10 % EX SWAB: 2.0000 | Freq: Once | CUTANEOUS | Status: DC

## 2022-06-28 MED ORDER — CLINDAMYCIN PHOSPHATE 900 MG/50ML IV SOLN
INTRAVENOUS | Status: AC
  Filled 2022-06-28: qty 50

## 2022-06-28 MED ORDER — CHLORHEXIDINE GLUCONATE CLOTH 2 % EX PADS
6.0000 | MEDICATED_PAD | Freq: Every day | CUTANEOUS | Status: DC
  Administered 2022-06-28 – 2022-06-29 (×2): 6 via TOPICAL

## 2022-06-28 MED ORDER — LACTATED RINGERS IV SOLN: INTRAVENOUS | Status: DC

## 2022-06-28 MED ORDER — OXYCODONE HCL 5 MG/5ML PO SOLN: 5.0000 mg | Freq: Once | ORAL | Status: DC | PRN

## 2022-06-28 MED ORDER — PROPOFOL 500 MG/50ML IV EMUL
INTRAVENOUS | Status: DC | PRN
  Administered 2022-06-28: 150 ug/kg/min via INTRAVENOUS

## 2022-06-28 MED ORDER — FENTANYL CITRATE (PF) 100 MCG/2ML IJ SOLN
INTRAMUSCULAR | Status: AC
  Filled 2022-06-28: qty 2

## 2022-06-28 MED ORDER — LIDOCAINE HCL (CARDIAC) PF 100 MG/5ML IV SOSY
PREFILLED_SYRINGE | INTRAVENOUS | Status: DC | PRN
  Administered 2022-06-28: 50 mg via INTRATRACHEAL

## 2022-06-28 MED ORDER — ACETAMINOPHEN 10 MG/ML IV SOLN: 1000.0000 mg | Freq: Once | INTRAVENOUS | Status: DC | PRN

## 2022-06-28 MED ORDER — ACETAMINOPHEN 10 MG/ML IV SOLN
INTRAVENOUS | Status: DC | PRN
  Administered 2022-06-28: 1000 mg via INTRAVENOUS

## 2022-06-28 SURGICAL SUPPLY — 46 items
BLADE MED AGGRESSIVE (BLADE) ×1 IMPLANT
BLADE OSC/SAGITTAL MD 5.5X18 (BLADE) IMPLANT
BLADE SURG 15 STRL LF DISP TIS (BLADE) IMPLANT
BLADE SURG 15 STRL SS (BLADE)
BLADE SURG MINI STRL (BLADE) IMPLANT
BNDG ELASTIC 4X5.8 VLCR STR LF (GAUZE/BANDAGES/DRESSINGS) ×1 IMPLANT
BNDG ESMARCH 4 X 12 STRL LF (GAUZE/BANDAGES/DRESSINGS) ×1
BNDG ESMARCH 4X12 STRL LF (GAUZE/BANDAGES/DRESSINGS) ×1 IMPLANT
BNDG GAUZE DERMACEA FLUFF 4 (GAUZE/BANDAGES/DRESSINGS) ×1 IMPLANT
CNTNR URN SCR LID CUP LEK RST (MISCELLANEOUS) ×1 IMPLANT
CONT SPEC 4OZ STRL OR WHT (MISCELLANEOUS) ×1
CUFF TOURN SGL QUICK 12 (TOURNIQUET CUFF) IMPLANT
CUFF TOURN SGL QUICK 18X4 (TOURNIQUET CUFF) IMPLANT
DRAPE FLUOR MINI C-ARM 54X84 (DRAPES) IMPLANT
DURAPREP 26ML APPLICATOR (WOUND CARE) ×1 IMPLANT
ELECT REM PT RETURN 9FT ADLT (ELECTROSURGICAL) ×1
ELECTRODE REM PT RTRN 9FT ADLT (ELECTROSURGICAL) ×1 IMPLANT
GAUZE SPONGE 4X4 12PLY STRL (GAUZE/BANDAGES/DRESSINGS) ×2 IMPLANT
GAUZE STRETCH 2X75IN STRL (MISCELLANEOUS) IMPLANT
GAUZE XEROFORM 1X8 LF (GAUZE/BANDAGES/DRESSINGS) ×1 IMPLANT
GLOVE BIO SURGEON STRL SZ7 (GLOVE) ×1 IMPLANT
GLOVE INDICATOR 7.0 STRL GRN (GLOVE) ×1 IMPLANT
GOWN STRL REUS W/ TWL LRG LVL3 (GOWN DISPOSABLE) ×2 IMPLANT
GOWN STRL REUS W/TWL LRG LVL3 (GOWN DISPOSABLE) ×2
HANDPIECE VERSAJET DEBRIDEMENT (MISCELLANEOUS) IMPLANT
IV NS IRRIG 3000ML ARTHROMATIC (IV SOLUTION) IMPLANT
KIT TURNOVER KIT A (KITS) ×1 IMPLANT
LABEL OR SOLS (LABEL) ×1 IMPLANT
MANIFOLD NEPTUNE II (INSTRUMENTS) ×1 IMPLANT
NDL FILTER BLUNT 18X1 1/2 (NEEDLE) ×1 IMPLANT
NDL HYPO 25X1 1.5 SAFETY (NEEDLE) ×2 IMPLANT
NEEDLE FILTER BLUNT 18X1 1/2 (NEEDLE) ×1 IMPLANT
NEEDLE HYPO 25X1 1.5 SAFETY (NEEDLE) ×2 IMPLANT
NS IRRIG 500ML POUR BTL (IV SOLUTION) ×1 IMPLANT
PACK EXTREMITY ARMC (MISCELLANEOUS) ×1 IMPLANT
SOL PREP PVP 2OZ (MISCELLANEOUS) ×1
SOLUTION PREP PVP 2OZ (MISCELLANEOUS) ×1 IMPLANT
STOCKINETTE STRL 6IN 960660 (GAUZE/BANDAGES/DRESSINGS) ×1 IMPLANT
SUT ETHILON 3-0 FS-10 30 BLK (SUTURE) ×2
SUT VIC AB 3-0 SH 27 (SUTURE) ×1
SUT VIC AB 3-0 SH 27X BRD (SUTURE) ×1 IMPLANT
SUTURE EHLN 3-0 FS-10 30 BLK (SUTURE) ×2 IMPLANT
SWAB CULTURE AMIES ANAERIB BLU (MISCELLANEOUS) IMPLANT
SYR 10ML LL (SYRINGE) ×1 IMPLANT
TRAP FLUID SMOKE EVACUATOR (MISCELLANEOUS) ×1 IMPLANT
WATER STERILE IRR 500ML POUR (IV SOLUTION) ×1 IMPLANT

## 2022-06-28 NOTE — Transfer of Care (Signed)
Immediate Anesthesia Transfer of Care Note  Patient: Nicholas [name]  Procedure(s) Performed: Procedure(s): RIGHT SECOND TOE AMPUTATION (Right)  Patient Location: PACU  Anesthesia Type:General  Level of Consciousness: sedated  Airway & Oxygen Therapy: Patient Spontanous Breathing and Patient connected to face mask oxygen  Post-op Assessment: Report given to RN and Post -op Vital signs reviewed and stable  Post vital signs: Reviewed and stable  Last Vitals:  Vitals:   06/28/22 0723 06/28/22 1038  BP: 122/79 101/69  Pulse: 90 77  Resp: 20 16  Temp: 36.6 C 36.8 C  SpO2: 96% 96%    Complications: No apparent anesthesia complications

## 2022-06-28 NOTE — Progress Notes (Addendum)
[facility] Progress Note  Patient: Nicholas [name]    WUJ:[id number]  DOA: 06/26/2022     Date of Service: the patient was seen and examined on 06/28/2022  Chief Complaint  Patient presents with   Leg Pain   Brief hospital course: 77 year old male with a PMH of Hypertension, Chronic Diastolic Heart Failure, PAF (on Xarelto), History of PE, History of TIA, and Chronic Neuropathic Pain and Gout who presented to the ED on 4/18 with a nonhealing ulcer of the distal aspect of the right second toe with surrounding erythema.  He was found to have right second toe osteomyelitis with surrounding cellulitis.  MRI showed bone infection of the distal phalanx.    On 4/20, Podiatry performed a right partial second toe amputation.  (Dr. Rosetta Posner).  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   - Continue IV Ceftriaxone and Clindamycin.   - Wound Cultures are growing MSSA.  Follow up on susceptibilities. - Appreciate Podiatry assistance and recommendations.  Paroxysmal atrial fibrillation: - Continue Atenolol for rate control. - Continue Xarelto for anticoagulation.  History of PE: - Xarelto.   History of TIA (transient ischemic attack): - Aspirin and Statin.  Essential Hypertension: BP is stable. - Atenolol.   Peripheral Neuropathy: - Gabapentin 200 mg BID.   Chronic Gout - Febuxostat and Colchicine as needed.  Insomnia: - Trazodone PRN.    Body mass index is 22.64 kg/m.  Interventions:    Diet: Heart healthy diet DVT Prophylaxis: Therapeutic Anticoagulation with Xarelto    Advance goals of care discussion: Full code  Family Communication: family was not present at bedside, at the time of interview.  The pt provided permission to discuss medical plan with the family. Opportunity was given to ask question and all questions were answered satisfactorily.    Disposition:  Pt is from Home, admitted with right second toe infection, still on IV Abx, which precludes a safe discharge. Discharge to Home, when stable and cleared by podiatry.  Subjective:  Nicholas [name] is doing well after amputation. He denies any significant pain complaints at this time. He denies any chest pain or shortness of breath.  Physical Exam: Physical Exam Constitutional:      General: He is not in acute distress.    Appearance: He is obese.  HENT:     Head: Normocephalic and atraumatic.     Mouth/Throat:     Mouth: Mucous membranes are moist.  Eyes:     Extraocular Movements: Extraocular movements intact.     Pupils: Pupils are equal, round, and reactive to light.  Cardiovascular:     Rate and Rhythm: Normal rate and regular rhythm.     Pulses: Normal pulses.     Heart sounds: Normal heart sounds.  Pulmonary:     Effort: Pulmonary effort is normal.     Breath sounds: Normal breath sounds.  Abdominal:     General: There is no distension.     Palpations: Abdomen is soft.     Tenderness: There is no abdominal tenderness.  Musculoskeletal:     Cervical back: Normal range of motion and neck supple.     Comments: Right 2nd toe partial amputation  Skin:    Capillary Refill: Capillary refill takes less than 2 seconds.     Comments: Wounds are stable.  Neurological:     General: No focal deficit present.  Psychiatric:  Mood and Affect: Mood normal.      Vitals:   06/28/22 1045 06/28/22 1057 06/28/22 1127 06/28/22 1515  BP: 104/72 116/75 116/73 119/76  Pulse: 76 83 63 63  Resp: 14 (!) 23 16 (!) 22  Temp:   (!) 97.5 F (36.4 C) 98.1 F (36.7 C)  TempSrc:   Oral   SpO2: 98% 96% 97% 95%  Weight:      Height:        Intake/Output Summary (Last 24 hours) at 06/28/2022 1800 Last data filed at 06/28/2022 1500 Gross per 24 hour  Intake 1059.08 ml  Output --  Net 1059.08 ml    Filed Weights   06/26/22 2024  Weight: 84.4 kg    Data  Reviewed: I have personally reviewed and interpreted daily labs, tele strips, imagings as discussed above. I reviewed all nursing notes, pharmacy notes, vitals, pertinent old records I have discussed plan of care as described above with RN and patient/family.  CBC: Recent Labs  Lab 06/26/22 2026 06/27/22 0834 06/28/22 0441  WBC 17.0* 9.9 6.9  NEUTROABS 14.7*  --   --   HGB 15.3 13.3 13.3  HCT 45.7 39.3 39.6  MCV 96.4 96.1 95.9  PLT 216 182 161    Basic Metabolic Panel: Recent Labs  Lab 06/26/22 2026 06/27/22 0834 06/28/22 0441  NA 136 136 138  K 4.2 3.6 3.9  CL 103 104 107  CO2 GLUCOSE 115* 98 93  BUN CREATININE 1.25* 1.09 1.04  CALCIUM 9.4 8.7* 8.8*  MG  --  1.8 1.9  PHOS  --  3.2 3.7     Studies: No results found.  Scheduled Meds:  vitamin C  250 mg Oral BID   aspirin EC  81 mg Oral Daily   atenolol  12.5 mg Oral BID   Chlorhexidine Gluconate Cloth  6 each Topical Daily   febuxostat  40 mg Oral Daily   gabapentin  200 mg Oral BID   multivitamin  1 tablet Oral Daily   mupirocin ointment  1 Application Nasal BID   povidone-iodine  2 Application Topical Once   rivaroxaban  20 mg Oral Daily   zinc sulfate  220 mg Oral Daily   Continuous Infusions:  sodium chloride 10 mL/hr at 06/27/22 2303   cefTRIAXone (ROCEPHIN)  IV 2 g (06/27/22 2304)   PRN Meds: sodium chloride, acetaminophen **OR** acetaminophen, colchicine, magnesium hydroxide, ondansetron **OR** ondansetron (ZOFRAN) IV, traZODone  Time spent: 60 minutes  Author: Baldwin Jamaica MD [facility] 06/28/2022 6:00 PM  To reach On-call, see care teams to locate the attending and reach out to them via [email or web address]. If 7PM-7AM, please contact night-coverage If you still have difficulty reaching the attending provider, please page the [facility] (Director on Call) for [facility] on amion for assistance.

## 2022-06-28 NOTE — Op Note (Signed)
PODIATRY / FOOT AND ANKLE SURGERY OPERATIVE REPORT    SURGEON: Rosetta Posner, DPM  PRE-OPERATIVE DIAGNOSIS:  1.  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  Neuropathy 3.  Cellulitis right second toe and right lower extremity  POST-OPERATIVE DIAGNOSIS: Same  PROCEDURE(S): Right partial second toe amputation  HEMOSTASIS: Right ankle tourniquet  ANESTHESIA: MAC  ESTIMATED BLOOD LOSS: 5 cc  FINDING(S): 1.  Osteomyelitis distal phalanx right second toe  PATHOLOGY/SPECIMEN(S): Right second toe distal phalanx, proximal margin marked in purple ink  INDICATIONS:   Nicholas [name] is a 77 y.o. male who presents with a nonhealing ulceration of the distal aspect of the right second toe with associated erythema and edema.  Patient also as well has erythema and edema present to the ankle and calf area but does not appear to be extending from the toe to the lower leg.  Appears to be 2 separate issues.  Patient has had weakness and fatigue and wife brought him into the emergency room due to concerns for infection.  Patient had x-ray imaging taken which did not reveal any bone infection but did have MRI taken which showed bone infection to the distal phalanx of the right second toe with no involvement of the middle or proximal phalanx.  All treatment options were discussed with the patient both conservative and surgical attempts at correction include potential risks and complications at this time patient is elected for surgical procedure consisting of right partial second toe amputation.  No guarantees given.  Consent obtained prior to procedure.  DESCRIPTION: After obtaining full informed written consent, the patient was brought back to the operating room and placed supine upon the operating table.  The patient received IV antibiotics prior to induction.  After obtaining adequate anesthesia, the patient was prepped and draped in the standard fashion.  10 cc of half  percent Marcaine plain was injected about the right second toe in a digital block.  An Esmarch bandage was used to exsanguinate the right lower extremity and pneumatic ankle tourniquet was inflated.  Attention was then directed to the distal aspect of the right second toe at the level of the middle phalanx over the DIPJ where a fishmouth type of incision was made leaving more plantar flap and dorsal flap.  The incision was made straight to bone.  At this time an extensor tenotomy and capsulotomy was performed followed by release the collateral and suspensory ligaments.  Any connection to the flexor tendon was released as well.  The distal phalanx was disarticulated and passed off the operative site.  The proximal margin was marked in purple ink and this was sent off to pathology for specimen.  The head of the middle phalanx appeared to be healthy and viable and appeared to be hard.  Appeared to have minimal cartilage damage overall.  The surgical site was flushed with copious amounts normal sterile saline.  There did not appear to be any further evidence of infection or osteomyelitis present to this point of the bone.  The skin was then reapproximated well coapted with 3-0 nylon.  The pneumatic ankle tourniquet was deflated and a prompt hyperemic response was noted to all digits of right foot.  Postoperative dressing was then applied consisting of Xeroform to the incision line followed by 4 x 4 gauze, gauze roll to below the knee, Ace wrap to below the knee with compression due to leg swelling.  Patient tolerated the procedure and anesthesia well and was transferred to the  recovery room vital signs stable.  Following a period of postoperative monitoring the patient will be discharged back to the inpatient room with the appropriate orders, instructions, and medications.  Will see patient tomorrow again for dressing change and further evaluation and if appears to be doing well could consider discharge at that  point.  COMPLICATIONS: None  CONDITION: Good, stable  Rosetta Posner, DPM

## 2022-06-28 NOTE — Anesthesia Postprocedure Evaluation (Signed)
Anesthesia Post Note  Patient: Nicholas [name]  Procedure(s) Performed: RIGHT SECOND TOE AMPUTATION (Right: Toe)  Patient location during evaluation: PACU Anesthesia Type: General Level of consciousness: awake and alert, oriented and patient cooperative Pain management: pain level controlled Vital Signs Assessment: post-procedure vital signs reviewed and stable Respiratory status: spontaneous breathing, nonlabored ventilation and respiratory function stable Cardiovascular status: blood pressure returned to baseline and stable Postop Assessment: adequate PO intake Anesthetic complications: no   No notable events documented.   Last Vitals:  Vitals:   06/28/22 1045 06/28/22 1057  BP: 104/72 116/75  Pulse: 76 83  Resp: 14 (!) 23  Temp:    SpO2: 98% 96%    Last Pain:  Vitals:   06/28/22 1057  TempSrc:   PainSc: 0-No pain                 Reed Breech

## 2022-06-28 NOTE — H&P (Signed)
HISTORY AND PHYSICAL INTERVAL NOTE:  06/28/2022  9:32 AM  Nicholas [name]  has presented today for surgery, with the diagnosis of Right second toe infection.  The various methods of treatment have been discussed with the patient.  No guarantees were given.  After consideration of risks, benefits and other options for treatment, the patient has consented to surgery.  I have reviewed the patients' chart and labs.    PROCEDURE: RIGHT PARTIAL 2ND TOE AMPUTATION  A history and physical examination was performed in the hospital.  The patient was reexamined.  There have been no changes to this history and physical examination.  Rosetta Posner, DPM

## 2022-06-28 NOTE — Anesthesia Preprocedure Evaluation (Signed)
Anesthesia Evaluation  Patient identified by MRN, date of birth, ID band Patient awake    Reviewed: Allergy & Precautions, NPO status , Patient's Chart, lab work & pertinent test results  History of Anesthesia Complications Negative for: history of anesthetic complications  Airway Mallampati: IV       Dental  (+) Missing   Pulmonary neg pulmonary ROS   Pulmonary exam normal breath sounds clear to auscultation       Cardiovascular + dysrhythmias (a fib)  Rhythm:Irregular Rate:Normal  Hx PE on Xarelto   Neuro/Psych TIA (greater than 10 years ago)   GI/Hepatic negative GI ROS,,,  Endo/Other  Obesity   Renal/GU negative Renal ROS   Prostate CA    Musculoskeletal  (+) Arthritis ,  Gout    Abdominal   Peds  Hematology negative hematology ROS (+)   Anesthesia Other Findings   Reproductive/Obstetrics                             Anesthesia Physical Anesthesia Plan  ASA: 3  Anesthesia Plan: General   Post-op Pain Management:    Induction: Intravenous  PONV Risk Score and Plan: 2 and Propofol infusion, TIVA, Treatment may vary due to age or medical condition and Ondansetron  Airway Management Planned: Natural Airway  Additional Equipment:   Intra-op Plan:   Post-operative Plan:   Informed Consent: I have reviewed the patients History and Physical, chart, labs and discussed the procedure including the risks, benefits and alternatives for the proposed anesthesia with the patient or authorized representative who has indicated his/her understanding and acceptance.       Plan Discussed with: CRNA  Anesthesia Plan Comments: (LMA/GETA backup discussed.  Patient consented for risks of anesthesia including but not limited to:  - adverse reactions to medications - damage to eyes, teeth, lips or other oral mucosa - nerve damage due to positioning  - sore throat or hoarseness - damage  to heart, brain, nerves, lungs, other parts of body or loss of life  Informed patient about role of CRNA in peri- and intra-operative care.  Patient voiced understanding.)       Anesthesia Quick Evaluation

## 2022-06-28 NOTE — Anesthesia Procedure Notes (Signed)
Date/Time: 06/28/2022 10:04 AM  Performed by: Stormy Fabian, CRNAPre-anesthesia Checklist: Patient identified, Emergency Drugs available, Suction available and Patient being monitored Patient Re-evaluated:Patient Re-evaluated prior to induction Oxygen Delivery Method: Simple face mask Induction Type: IV induction Dental Injury: Teeth and Oropharynx as per pre-operative assessment

## 2022-06-29 ENCOUNTER — Encounter: Payer: Self-pay | Admitting: Podiatry

## 2022-06-29 DIAGNOSIS — L0390 Cellulitis, unspecified: Secondary | ICD-10-CM | POA: Diagnosis not present

## 2022-06-29 DIAGNOSIS — A419 Sepsis, unspecified organism: Secondary | ICD-10-CM | POA: Diagnosis not present

## 2022-06-29 LAB — CULTURE, BLOOD (ROUTINE X 2)
Culture: NO GROWTH
Culture: NO GROWTH

## 2022-06-29 LAB — CBC
HCT: 39.8 % (ref 39.0–52.0)
Hemoglobin: 13.5 g/dL (ref 13.0–17.0)
MCH: 32.3 pg (ref 26.0–34.0)
MCHC: 33.9 g/dL (ref 30.0–36.0)
MCV: 95.2 fL (ref 80.0–100.0)
Platelets: 195 10*3/uL (ref 150–400)
RBC: 4.18 MIL/uL — ABNORMAL LOW (ref 4.22–5.81)
RDW: 12.7 % (ref 11.5–15.5)
WBC: 6.7 10*3/uL (ref 4.0–10.5)
nRBC: 0 % (ref 0.0–0.2)

## 2022-06-29 LAB — BASIC METABOLIC PANEL
Anion gap: 9 (ref 5–15)
BUN: 15 mg/dL (ref 8–23)
CO2: 21 mmol/L — ABNORMAL LOW (ref 22–32)
Calcium: 8.8 mg/dL — ABNORMAL LOW (ref 8.9–10.3)
Chloride: 107 mmol/L (ref 98–111)
Creatinine, Ser: 0.98 mg/dL (ref 0.61–1.24)
GFR, Estimated: >60 mL/min (ref >60)
Glucose, Bld: 98 mg/dL (ref 70–99)
Potassium: 3.7 mmol/L (ref 3.5–5.1)
Sodium: 137 mmol/L (ref 135–145)

## 2022-06-29 MED ORDER — CIPROFLOXACIN IN D5W 400 MG/200ML IV SOLN
400.0000 mg | Freq: Two times a day (BID) | INTRAVENOUS | Status: DC
  Administered 2022-06-29 – 2022-06-30 (×2): 400 mg via INTRAVENOUS
  Filled 2022-06-29 (×3): qty 200

## 2022-06-29 MED ORDER — GABAPENTIN 100 MG PO CAPS: 100.0000 mg | ORAL_CAPSULE | Freq: Every evening | ORAL | Status: DC | PRN

## 2022-06-29 NOTE — Progress Notes (Signed)
PODIATRY / FOOT AND ANKLE SURGERY PROGRESS NOTE  Requesting Physician: Dr. Lucianne Muss  Reason for consult: Right foot infection/swelling  Chief Complaint: Right foot infection/leg swelling and redness   HPI: Nicholas [name] is a 77 y.o. male who presents today for follow-up status post right second toe amputation partial.  Patient is resting in bed comfortably.  Patient notes that he is feeling pretty good but pretty tired because of the hard time sleeping last night.  Patient is kept dressings clean and intact and has been weightbearing in surgical shoe.  Patient also has a walker at bedside has been using.  Patient currently denies nausea, vomiting, fevers, chills.  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   Dysrhythmia    Pulmonary embolism    Skin cancer    TIA (transient ischemic attack)     Surgical Hx:  Past Surgical History:  Procedure Laterality Date   AMPUTATION TOE Right 06/28/2022   Procedure: RIGHT SECOND TOE AMPUTATION;  Surgeon: Rosetta Posner, DPM;  Location: [facility] ORS;  Service: Podiatry;  Laterality: Right;   APPENDECTOMY     COLONOSCOPY WITH PROPOFOL N/A 11/23/2019   Procedure: COLONOSCOPY WITH PROPOFOL;  Surgeon: Toledo, Boykin Nearing, MD;  Location: [facility] END[facility];  Service: Gastroenterology;  Laterality: N/A;   cyber knife surgery     PROSTATE SURGERY      FHx:  Family History  Problem Relation Age of Onset   Hypertension Mother     Social History:  reports that he has never smoked. He has never used smokeless tobacco. He reports that he does not drink alcohol and does not use drugs.  Allergies:  Allergies  Allergen Reactions   Penicillins Other (See Comments)    Tolerated ceftriaxone 06/27/22   Allopurinol Hives    Medications Prior to Admission  Medication Sig Dispense Refill   aspirin EC 81 MG tablet Take 81 mg by mouth daily. Swallow whole.     atenolol (TENORMIN) 25 MG  tablet Take 12.5 mg by mouth 2 (two) times daily.     colchicine 0.6 MG tablet Take 0.6 mg by mouth as needed.     febuxostat (ULORIC) 40 MG tablet Take 40 mg by mouth daily.     gabapentin (NEURONTIN) 100 MG capsule Take 200 mg by mouth 2 (two) times daily.     multivitamin-lutein (OCUVITE-LUTEIN) CAPS capsule Take 1 capsule by mouth daily.     vitamin C (ASCORBIC ACID) 250 MG tablet Take 1 tablet (250 mg total) by mouth 2 (two) times daily. 60 tablet 2   XARELTO 20 MG TABS tablet Take 20 mg by mouth daily.     zinc sulfate (ZINC-220) 220 (50 Zn) MG capsule Take 1 capsule (220 mg total) by mouth daily. 90 capsule 0   cephALEXin (KEFLEX) 500 MG capsule Take 500 mg by mouth 3 (three) times daily. (Patient not taking: Reported on 06/26/2022)     ciprofloxacin (CIPRO) 500 MG tablet Take 1 tablet by mouth 2 (two) times daily. (Patient not taking: Reported on 06/26/2022)     hydrocortisone 2.5 % cream APPLY EXTERNALLY TO THE AFFECTED AREA EVERY DAY ON TUES, THURSDAY, SATURDAY AS DIRECTED (Patient not taking: Reported on 06/26/2022) 30 g 11     Physical Exam: General: Alert and oriented.  No apparent distress.  Vascular: DP/PT pulses palpable bilateral, capillary fill time intact to digits bilaterally.  Reduction in erythema and  edema present to the right lower leg.  Neuro: Light touch sensation nearly absent to bilateral lower extremities.  Derm: Right second toe partial amputation, incision line appears to be well coapted with sutures intact, erythema and edema still present to the right second toe but does appear to be gradually improving.  Erythema to the right second toe is blanchable and that is improved with elevation.  Erythema and edema to the right lower extremity also appears to be greatly improved as skin wrinkles are seen and overall last edema and erythema is present.       MSK: 4/5 strength bilateral lower extremity muscle groups.  Results for orders placed or performed during the  hospital encounter of 06/26/22 (from the past 48 hour(s))  Aerobic/Anaerobic Culture w Gram Stain (surgical/deep wound)     Status: None (Preliminary result)   Collection Time: 06/27/22  1:49 PM   Specimen: Foot; Wound  Result Value Ref Range   Specimen Description      FOOT Performed at [facility], [address] [place], [place] [address]    Special Requests      NONE Performed at [facility], [address] [place], [place] [address]    Gram Stain NO WBC SEEN NO ORGANISMS SEEN     Culture      MODERATE STAPHYLOCOCCUS AUREUS RARE GRAM NEGATIVE RODS SUSCEPTIBILITIES TO FOLLOW Performed at [facility], [address], [place], [place] [address]    Report Status PENDING   Protime-INR     Status: Abnormal   Collection Time: 06/27/22  4:59 PM  Result Value Ref Range   Prothrombin Time 23.6 (H) 11.4 - 15.2 seconds   INR 2.1 (H) 0.8 - 1.2    Comment: (NOTE) INR goal varies based on device and disease states. Performed at [facility], [address] [place], [place] [address]   Lactic acid, plasma     Status: None   Collection Time: 06/27/22  4:59 PM  Result Value Ref Range   Lactic Acid, Venous 1.6 0.5 - 1.9 mmol/L    Comment: Performed at [facility], [address], [place], [place] [address]  Surgical PCR screen     Status: Abnormal   Collection Time: 06/27/22  8:30 PM   Specimen: Nasal Mucosa; Nasal Swab  Result Value Ref Range   MRSA, PCR NEGATIVE NEGATIVE   Staphylococcus aureus POSITIVE (A) NEGATIVE    Comment: (NOTE) The Xpert SA Assay (FDA approved for NASAL specimens in patients 52 years of age and older), is one component of a comprehensive surveillance program. It is not intended to diagnose infection nor to guide or monitor treatment. Performed at [facility], [address] [place], [place] [address]   CBC     Status: Abnormal   Collection Time: 06/28/22  4:41 AM  Result Value Ref Range    WBC 6.9 4.0 - 10.5 K/uL   RBC 4.13 (L) 4.22 - 5.81 MIL/uL   Hemoglobin 13.3 13.0 - 17.0 g/dL   HCT 41.6 60.6 - 30.1 %   MCV 95.9 80.0 - 100.0 fL   MCH 32.2 26.0 - 34.0 pg   MCHC 33.6 30.0 - 36.0 g/dL   RDW 60.1 09.3 - 23.5 %   Platelets 161 150 - 400 K/uL   nRBC 0.0 0.0 - 0.2 %    Comment: Performed at [facility], [address], [place], [place] [address]  Basic metabolic panel     Status:  Abnormal   Collection Time: 06/28/22  4:41 AM  Result Value Ref Range   Sodium 138 135 - 145 mmol/L   Potassium 3.9 3.5 - 5.1 mmol/L   Chloride 107 98 - 111 mmol/L   CO2 23 22 - 32 mmol/L   Glucose, Bld 93 70 - 99 mg/dL    Comment: Glucose reference range applies only to samples taken after fasting for at least 8 hours.   BUN 17 8 - 23 mg/dL   Creatinine, Ser 4.40 0.61 - 1.24 mg/dL   Calcium 8.8 (L) 8.9 - 10.3 mg/dL   GFR, Estimated >34 >74 mL/min    Comment: (NOTE) Calculated using the CKD-EPI Creatinine Equation (2021)    Anion gap 8 5 - 15    Comment: Performed at [facility], [address] [place], [place] [address]  Magnesium     Status: None   Collection Time: 06/28/22  4:41 AM  Result Value Ref Range   Magnesium 1.9 1.7 - 2.4 mg/dL    Comment: Performed at [facility] [address] [place], [place] [address]  Phosphorus     Status: None   Collection Time: 06/28/22  4:41 AM  Result Value Ref Range   Phosphorus 3.7 2.5 - 4.6 mg/dL    Comment: Performed at [facility], [address] [place], [place] [address]  Cortisol-am, blood     Status: Abnormal   Collection Time: 06/28/22  4:41 AM  Result Value Ref Range   Cortisol - AM 3.2 (L) 6.7 - 22.6 ug/dL    Comment: Performed at [facility], [address], [place], [place] [address]  CBC     Status: Abnormal   Collection Time: 06/29/22  4:55 AM  Result Value Ref Range   WBC 6.7 4.0 - 10.5 K/uL   RBC 4.18 (L) 4.22 - 5.81 MIL/uL   Hemoglobin 13.5 13.0 - 17.0 g/dL   HCT  16.6 06.3 - 01.6 %   MCV 95.2 80.0 - 100.0 fL   MCH 32.3 26.0 - 34.0 pg   MCHC 33.9 30.0 - 36.0 g/dL   RDW 01.0 93.2 - 35.5 %   Platelets 195 150 - 400 K/uL   nRBC 0.0 0.0 - 0.2 %    Comment: Performed at [facility], [address] [place], [place] [address]  Basic metabolic panel     Status: Abnormal   Collection Time: 06/29/22  4:55 AM  Result Value Ref Range   Sodium 137 135 - 145 mmol/L   Potassium 3.7 3.5 - 5.1 mmol/L   Chloride 107 98 - 111 mmol/L   CO2 21 (L) 22 - 32 mmol/L   Glucose, Bld 98 70 - 99 mg/dL    Comment: Glucose reference range applies only to samples taken after fasting for at least 8 hours.   BUN 15 8 - 23 mg/dL   Creatinine, Ser 2.54 0.61 - 1.24 mg/dL   Calcium 8.8 (L) 8.9 - 10.3 mg/dL   GFR, Estimated >27 >06 mL/min    Comment: (NOTE) Calculated using the CKD-EPI Creatinine Equation (2021)    Anion gap 9 5 - 15    Comment: Performed at [facility], [address] [place], [place] [address]   DG Foot 2 Views Right  Result Date: 06/28/2022 CLINICAL DATA:  Status post partial amputation of the second toe. EXAM: RIGHT FOOT - 2 VIEW COMPARISON:  Radiograph dated 06/27/2022. FINDINGS: Status post amputation of the distal phalanx of the  second toe. There is no acute fracture or dislocation. The bones are osteopenic. There is soft tissue swelling of the forefoot. 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 Electronically Signed   By: Elgie Collard M.D.   On: 06/28/2022 19:18   MR FOOT RIGHT WO CONTRAST  Result Date: 06/27/2022 CLINICAL DATA:  Foot swelling, diabetic, osteomyelitis suspected. Acute onset of worsening right 2nd toe swelling and erythema. History of gout. EXAM: MRI OF THE RIGHT FOREFOOT WITHOUT CONTRAST TECHNIQUE: Multiplanar, multisequence MR imaging of the right forefoot was performed. No intravenous contrast was administered. COMPARISON:  Radiographs same date.  No other comparison studies.  FINDINGS: Bones/Joint/Cartilage There is abnormal marrow T2 hyperintensity within the distal 2nd phalanx. Questionable small erosion of the distal tuft on the T1 weighted images without gross cortical destruction. The middle and proximal phalanges appear normal. The other toes and metatarsals appear normal. There are no significant joint effusions. Mild degenerative changes at the 1st metatarsophalangeal joint. Ligaments The Lisfranc ligament is intact. The collateral ligaments of the metatarsophalangeal joints are intact. 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 No significant tenosynovitis. Soft tissues Nonspecific dorsal subcutaneous edema throughout the forefoot. Soft tissue swelling extends into the 2nd toe where there may be mild skin ulceration distally. No focal fluid collection or foreign body identified. IMPRESSION: 1. Soft tissue swelling in the 2nd toe with possible skin ulceration distally. Correlate clinically. 2. Marrow T2 hyperintensity within the distal 2nd phalanx with possible small erosion of the distal tuft, suspicious for early osteomyelitis. 3. The other toes and metatarsals appear normal. 4. Nonspecific dorsal subcutaneous edema throughout the forefoot. No focal fluid collection. Electronically Signed   By: Carey Bullocks M.D.   On: 06/27/2022 14:56    Blood pressure 121/81, pulse 66, temperature (!) 97.4 F (36.3 C), temperature source Oral, resp. rate (!) 22, height 6\' 4"  (1.93 m), weight 84.4 kg, SpO2 96 %.  Assessment R 2nd toe cellulitis with associated DFU 2nd toe distal tip status post partial amputation right second toe RLE cellulitis DM2 polyneuropathy  Plan -Patient seen and examined. -X-ray imaging reviewed postoperatively.  No further evidence of infection based on x-ray imaging after surgery. -Believe that all infection was likely removed with amputation based on intraoperative  findings as well as MRI that only showed infection to the distal tip of the right second toe.  Patient still has some degree of cellulitis to the right second toe but does appear to be gradually improving clinically. -Incision line into the right second toe appears to be well coapted with sutures intact, no dehiscence or drainage present. -Concerned still that patient still has some degree of cellulitis to the right second toe but also appears to have dependent rubor to the area as well.  Erythema does appear to be blanchable to the toe with capillary fill time intact.  Would recommend likely keeping the patient for another day and keeping him on IV antibiotics.  After today can likely consider discharge tomorrow with oral antibiotics after another dressing change in the morning.  Appreciate medicine recommendations for antibiotic therapy.  Wound culture growing Staph aureus as well as gram-negative rods, susceptibilities to follow. -Patient can be weightbearing as tolerated in surgical shoe.  PT/OT has been ordered.  Appreciate recommendations.  Rosetta Posner, DPM 06/29/2022, 10:14 AM

## 2022-06-29 NOTE — Progress Notes (Addendum)
  Progress Note   Patient: Nicholas [name] JXB:[id number] DOB: 17-Mar-1945 DOA: 06/26/2022     3 DOS: the patient was seen and examined on 06/29/2022   Brief hospital course:  77 year old male with a PMH of Hypertension, Chronic Diastolic Heart Failure, PAF (on Xarelto), History of PE, History of TIA, and Chronic Neuropathic Pain and Gout who presented to the ED on 4/18 with a nonhealing ulcer of the distal aspect of the right second toe with surrounding erythema.  He was found to have right second toe osteomyelitis with surrounding cellulitis.  MRI showed bone infection of the distal phalanx.     On 4/20, Podiatry performed a right partial second toe amputation.  (Dr. Rosetta Posner).  Further hospital course and management as outlined below.   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   - Treated w/ empiric IV Ceftriaxone and Clindamycin.   - Wound Cultures are growing MSSA and Enterobacter cloacae - Transition abx to IV Cipro    Paroxysmal atrial fibrillation: - Continue Atenolol for rate control. - Continue Xarelto for anticoagulation.   History of PE: - Xarelto.   History of TIA (transient ischemic attack): - Aspirin and Statin.   Essential Hypertension: BP is stable. - Atenolol.   Peripheral Neuropathy: - Gabapentin 200 mg BID.   Chronic Gout - Febuxostat and Colchicine as needed.   Insomnia: - Trazodone PRN.      Subjective: Pt awake sitting up in bed when seen this AM.  He denies any pain in his foot.  Reports overall he feels well.  Reports left foot burning neuropathic pain that wakes him from sleep.  Takes PRN extra dose gabapentin if it wakes him up at night.  No other acute complaints.   Physical Exam: Vitals:   06/28/22 1942 06/29/22 0636 06/29/22 0725 06/29/22 0840  BP: 121/80 (!) 132/93  121/81   Pulse: 90 73 67 66  Resp: 20 20 (!) 22   Temp: 97.6 F (36.4 C) 97.7 F (36.5 C) (!) 97.4 F (36.3 C)   TempSrc: Oral  Oral   SpO2: 99% 96% 92% 96%  Weight:      Height:       General exam: awake, alert, no acute distress HEENT: moist mucus membranes, hearing grossly normal  Respiratory system: CTA, no wheezes, rales or rhonchi, normal respiratory effort. Cardiovascular system: normal S1/S2, RRR, no JVD, murmurs, rubs, gallops,  no pedal edema.   Gastrointestinal system: soft, NT, ND, no HSM felt, +bowel sounds. Central nervous system: A&O x 4. no gross focal neurologic deficits, normal speech Extremities: right foot with clean/dry/intact dressing and ace bandage Skin: dry, intact, normal temperature Psychiatry: normal mood, congruent affect, judgement and insight appear normal   Data Reviewed:  Notable labs ---  bicarb 21, Ca 8.8 otherwise normal BMP.    Family Communication: None. Pt is able to update.  Disposition: Status is: Inpatient Remains inpatient appropriate because: remains on IV antibiotics until cleared by podiatry to transition to PO antibiotics.  Podiatry closely monitoring surgical wound for improvement.   Planned Discharge Destination: Home with Home Health    Time spent: 38 minutes  Author: Pennie Banter, DO 06/29/2022 3:47 PM  For on call review [email or web address].

## 2022-06-29 NOTE — Discharge Instructions (Signed)
Podiatry discharge instructions: 1.  Keep dressings to the right lower extremity clean, dry, and intact.  Do not get dressings wet. 2.  May be weightbearing as tolerated in surgical shoe.  Try to only be up on feet for short periods of time.  Should not be walking long distances. 3.  Take antibiotics as prescribed until gone. 4.  Follow-up in clinic within 1 week of discharge date for dressing change and reevaluation.

## 2022-06-29 NOTE — Evaluation (Signed)
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 See flowsheet below for details of function. Pt deferring mobility at this time; extensive education provided on ADLs in home environment and need for assistance (see below). Patient will benefit from continued OT while in acute care.       Recommendations for follow up therapy are one component of a multi-disciplinary discharge planning process, led by the attending physician.  Recommendations may be updated based on patient status, additional functional criteria and insurance authorization.   Assistance Recommended at Discharge Intermittent Supervision/Assistance  Patient can return home with the following A little help with walking and/or transfers;A little help with bathing/dressing/bathroom;Assistance with cooking/housework;Direct supervision/assist for financial management;Help with stairs or ramp for entrance;Assist for transportation    Functional Status Assessment  Patient has had a recent decline in their functional status and demonstrates the ability to make significant improvements in function in a reasonable and predictable amount of time.  Equipment Recommendations  Toilet riser;Tub/shower seat;Other (comment) (paper tape for wrapping surgical leg with large bag)    Recommendations for Other Services       Precautions / Restrictions Precautions Precautions: Fall Restrictions Weight Bearing Restrictions: Yes RLE Weight Bearing: Weight bearing as tolerated Other Position/Activity Restrictions: WBAT with post-op shoe, heel WBing as able;  short-distances only      Mobility Bed Mobility               General bed mobility comments: pt deferred at this time.    Transfers                          Balance                                           ADL either performed or assessed with clinical judgement   ADL Overall ADL's : Needs assistance/impaired                                       General ADL Comments: Pt declines OOB activity today, stating he has already been to bathroom earlier without difficulty; has been using RW in room with post-op shoe. OT provided extensive education on ADLs, IADLs, and transfers. Specifically, discussed toilet riser, shower chair, where to purchase, how to wrap leg with towel, trash bag, and paper tape for showering (with shower chair only) if pt desires to, as well as option for use of no-rinse shampoo versus sponge bathing and washing hair in sink. Pt needing some reinforcement and repetition of concepts during conversation. Recommend follow-up and training with home health OT to ensure carry-over and safety during intial showering ADL at home. Discussed need for assistance from daughter or other capable family member for tasks such as laundry, groceries, taking trash out, driving, and meal preparation. Encouraged pt to make use of disposable dishes to reduce housework in the coming weeks. Pt states he is reluctant to ask for help, but knows  he needs to. Recommend supervision/standby assist for showering and shower transfers at d/c.  Pt performed mobility with PT using RW earlier today.     Vision         Perception     Praxis      Pertinent Vitals/Pain Pain Assessment Pain Assessment: 0-10 Pain Score:  (unrated) Pain Location: BIL LE- neuropathy pain (pt denies surgical pain) Pain Descriptors / Indicators: Pins and needles Pain Intervention(s): Monitored during session     Hand Dominance Right   Extremity/Trunk Assessment  Upper Extremity Assessment Upper Extremity Assessment: Overall WFL for tasks assessed   Lower Extremity Assessment Lower Extremity Assessment: Defer to PT evaluation       Communication Communication Communication: No difficulties   Cognition Arousal/Alertness: Awake/alert Behavior During Therapy: WFL for tasks assessed/performed Overall Cognitive Status: Within Functional Limits for tasks assessed                                 General Comments: Pt is socially pleasant; needing some instructions to be repeated; will want to ensure that home health or capable caregiver is present during high-risk ADLs at home such as showering. Should check on pt's recollection of today's OT instructions tomorrow to ensure carry-over.     General Comments  OT provided recommendations in written format for patient- shower chair, toilet riser, wrapping leg with bag and paper tape prior to showering per MD instructions to keep ace wrap dry.    Exercises     Shoulder Instructions      Home Living Family/patient expects to be discharged to:: Private residence Living Arrangements: Spouse/significant other (wife has parkinsons and pt is her caregiver (drives; assists with IADLs and LB dressing).) Available Help at Discharge: Family;Available PRN/intermittently (daughter is Engineer, site; grandkids are teens who can drive) Type of Home: House Home Access: Stairs to enter Secretary/administrator of Steps: 3 Entrance Stairs-Rails: Right;Left Home Layout: One level     Bathroom Shower/Tub: Producer, television/film/video: Standard Bathroom Accessibility: Yes How Accessible: Accessible via walker Home Equipment: Rolling Walker (2 wheels);Grab bars - tub/shower   Additional Comments: pt states he has been thinking of getting a toilet riser, but has not done so yet      Prior Functioning/Environment Prior Level of Function : Independent/Modified Independent              Mobility Comments: Mod indep with ADLs, household and community mobilization; "designated driver' in the family (wife doesn't drive); denies fall history outside of this episode.  Serves as caregiver for his wife (has parkinson's), assisting with ADLs as needed and maintaining all household chores, iADLs ADLs Comments: independent with ADLs and assisting wife with her ADLs        OT Problem List: Impaired balance (sitting and/or standing);Decreased knowledge of use of DME or AE      OT Treatment/Interventions: Self-care/ADL training;Therapeutic exercise;Therapeutic activities;Patient/family education    OT Goals(Current goals can be found in the care plan section) Acute Rehab OT Goals Patient Stated Goal: Get better; go home OT Goal Formulation: With patient Time For Goal Achievement: 07/13/22 Potential to Achieve Goals: Good ADL Goals Pt Will Perform Lower Body Bathing: with modified independence;sit to/from stand Pt Will Perform Lower Body Dressing: with modified independence;sit to/from stand Pt Will Transfer to Toilet: with modified independence;ambulating Pt Will Perform Toileting - Clothing Manipulation and hygiene: with modified independence;sit to/from stand Pt Will Perform Tub/Shower  Transfer: with modified independence;Shower transfer;shower seat;ambulating  OT Frequency: Min 1X/week    Co-evaluation              AM-PAC OT "6 Clicks" Daily Activity     Outcome Measure Help from another person eating meals?: None Help from another person taking care of personal grooming?: A Little Help from another person toileting, which includes using toliet, bedpan, or urinal?: A Little Help from another person bathing (including washing, rinsing, drying)?: A Little Help from another person to put on and taking off regular upper body clothing?: None Help from another person to put on and taking off regular lower body clothing?: A Little 6 Click Score: 20   End of Session     Activity Tolerance: Patient tolerated treatment well Patient left: in bed;with call bell/phone within reach  OT Visit Diagnosis:  (ADL deficit)                Time: 1610-9604 OT Time Calculation (min): 33 min Charges:  OT General Charges $OT Visit: 1 Visit OT Evaluation $OT Eval Low Complexity: 1 Low OT Treatments $Self Care/Home Management : 23-37 mins  Linward Foster, MS, OTR/L  Alvester Morin 06/29/2022, 4:00 PM

## 2022-06-29 NOTE — Evaluation (Signed)
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 Patient alert and oriented; follows commands and agreeable to participation with session.  Denies pain; R LE dressing/ace wrap in place with no visible bleeding/drainage noted (before or after session).  Bilat Ue/LE strength and ROM grossly symmetrical and WFL; no focal weakness appreciated.  Able to complete bed mobility indep; sit/stand, basic transfers and gait (30') with RW, cga/min assist.  Educated in 3-point, step to gait pattern with WBing through UEs as able to offset R LE. Min cuing for walker position and use; good integration of all cuing and education. Reviewed orders for limited gait distances, use of post-op shoe and heel WBing with all mobility and WBing tasks.  Patient voiced understanding of all information. Would benefit from skilled PT in a post-acute setting to address above deficits and promote optimal return to PLOF        Recommendations for follow up therapy are one component of a multi-disciplinary discharge planning process, led by the attending physician.  Recommendations may be updated based on patient status, additional functional criteria and insurance authorization.  Follow Up Recommendations       Assistance Recommended at Discharge PRN  Patient can return home with the following  A little help with walking and/or transfers;A little help with bathing/dressing/bathroom    Equipment Recommendations Rolling walker (2 wheels)  Recommendations for Other Services       Functional  Status Assessment Patient has had a recent decline in their functional status and demonstrates the ability to make significant improvements in function in a reasonable and predictable amount of time.     Precautions / Restrictions Precautions Precautions: Fall Restrictions Weight Bearing Restrictions: Yes RLE Weight Bearing: Weight bearing as tolerated Other Position/Activity Restrictions: WBAT with post-op shoe, heel WBing as able; short-distances only      Mobility  Bed Mobility Overal bed mobility: Modified Independent                  Transfers Overall transfer level: Needs assistance   Transfers: Sit to/from Stand Sit to Stand: Min guard           General transfer comment: prefers pulling up at sink to assist with sit/stand (difficulty from low bed height)    Ambulation/Gait Ambulation/Gait assistance: Min guard Gait Distance (Feet): 30 Feet Assistive device: Rolling walker (2 wheels)         General Gait Details: educated in 3-point, step to gait pattern with WBing through UEs as able to offset R LE.  Min cuing for walker position and use, but good integration of cuing.  Stairs            Wheelchair Mobility    Modified Rankin (Stroke Patients Only)       Balance Overall balance assessment: Needs assistance Sitting-balance support: No upper extremity supported, Feet supported Sitting balance-Leahy Scale: Good     Standing balance support: Bilateral upper extremity supported Standing balance-Leahy Scale: Fair  Pertinent Vitals/Pain Pain Assessment Pain Assessment: No/denies pain    Home Living Family/patient expects to be discharged to:: Private residence Living Arrangements: Spouse/significant other Available Help at Discharge: Family;Available PRN/intermittently Type of Home: House Home Access: Stairs to enter Entrance Stairs-Rails: Doctor, general practice of Steps: 3   Home  Layout: One level        Prior Function Prior Level of Function : Independent/Modified Independent             Mobility Comments: Mod indep with ADLs, household and community mobilization; "designated driver' in the family (wife doesn't drive); denies fall history outside of this episode.  Serves as caregiver for his wife (has parkinson's), assisting with ADLs as needed and maintaining all household chores, iADLs       Hand Dominance   Dominant Hand: Right    Extremity/Trunk Assessment   Upper Extremity Assessment Upper Extremity Assessment: Overall WFL for tasks assessed    Lower Extremity Assessment Lower Extremity Assessment: Overall WFL for tasks assessed (R foot with post-op dressing and ace wrap; no visible bleeding noted)       Communication   Communication: No difficulties  Cognition Arousal/Alertness: Awake/alert Behavior During Therapy: WFL for tasks assessed/performed Overall Cognitive Status: Within Functional Limits for tasks assessed                                          General Comments      Exercises Other Exercises Other Exercises: Toilet transfer, ambulatory with RW, cga/close sup; standing balance to urinate, sup/mod indep (using grab bars for additional safety/support as needed).  Patient requested to remain on toilet for a period of time (for BM); to use call bell to call CNA when done.  CNA informed/aware of patient position, mobility performance and WBing precautions Other Exercises: Reviewed recommendations for use of R LE post-op shoe, donning/doffing and recommendations for limited distances; patient voiced understanding of all information.   Assessment/Plan    PT Assessment Patient needs continued PT services  PT Problem List Decreased activity tolerance;Decreased balance;Decreased mobility;Decreased knowledge of use of DME;Decreased safety awareness;Decreased knowledge of precautions       PT Treatment Interventions  DME instruction;Gait training;Stair training;Therapeutic activities;Functional mobility training;Therapeutic exercise;Balance training;Patient/family education    PT Goals (Current goals can be found in the Care Plan section)  Acute Rehab PT Goals Patient Stated Goal: to return home PT Goal Formulation: With patient Time For Goal Achievement: 07/13/22 Potential to Achieve Goals: Good    Frequency Min 3X/week     Co-evaluation               AM-PAC PT "6 Clicks" Mobility  Outcome Measure Help needed turning from your back to your side while in a flat bed without using bedrails?: None Help needed moving from lying on your back to sitting on the side of a flat bed without using bedrails?: None Help needed moving to and from a bed to a chair (including a wheelchair)?: A Little Help needed standing up from a chair using your arms (e.g., wheelchair or bedside chair)?: A Little Help needed to walk in hospital room?: A Little Help needed climbing 3-5 steps with a railing? : A Little 6 Click Score: 20    End of Session Equipment Utilized During Treatment: Gait belt Activity Tolerance: Patient tolerated treatment well Patient left:  (on toilet; to call CNA once complete.  CNA  informed/aware) Nurse Communication: Mobility status PT Visit Diagnosis: Muscle weakness (generalized) (M62.81);Difficulty in walking, not elsewhere classified (R26.2)    Time: 4098-1191 PT Time Calculation (min) (ACUTE ONLY): 26 min   Charges:   PT Evaluation $PT Eval Moderate Complexity: 1 Mod PT Treatments $Therapeutic Activity: 8-22 mins       Shirleymae Hauth H. Manson Passey, PT, DPT, NCS 06/29/22, 12:56 PM [phone]

## 2022-06-29 NOTE — Progress Notes (Signed)
End of Shift Summary:  Date: 06/29/22 Shift: 1900-0700 Ambulatory: standby assist to use urinal Significant Events: no significant events overnight

## 2022-06-30 DIAGNOSIS — E876 Hypokalemia: Secondary | ICD-10-CM | POA: Insufficient documentation

## 2022-06-30 DIAGNOSIS — L0390 Cellulitis, unspecified: Secondary | ICD-10-CM | POA: Diagnosis not present

## 2022-06-30 DIAGNOSIS — A419 Sepsis, unspecified organism: Secondary | ICD-10-CM | POA: Diagnosis not present

## 2022-06-30 LAB — CBC
HCT: 40 % (ref 39.0–52.0)
Hemoglobin: 13.5 g/dL (ref 13.0–17.0)
MCH: 32.1 pg (ref 26.0–34.0)
MCHC: 33.8 g/dL (ref 30.0–36.0)
MCV: 95.2 fL (ref 80.0–100.0)
Platelets: 219 10*3/uL (ref 150–400)
RBC: 4.2 MIL/uL — ABNORMAL LOW (ref 4.22–5.81)
RDW: 12.7 % (ref 11.5–15.5)
WBC: 6.6 10*3/uL (ref 4.0–10.5)
nRBC: 0 % (ref 0.0–0.2)

## 2022-06-30 LAB — BASIC METABOLIC PANEL
Anion gap: 8 (ref 5–15)
BUN: 18 mg/dL (ref 8–23)
CO2: 20 mmol/L — ABNORMAL LOW (ref 22–32)
Calcium: 8.6 mg/dL — ABNORMAL LOW (ref 8.9–10.3)
Chloride: 109 mmol/L (ref 98–111)
Creatinine, Ser: 1.04 mg/dL (ref 0.61–1.24)
GFR, Estimated: >60 mL/min (ref >60)
Glucose, Bld: 135 mg/dL — ABNORMAL HIGH (ref 70–99)
Potassium: 3.4 mmol/L — ABNORMAL LOW (ref 3.5–5.1)
Sodium: 137 mmol/L (ref 135–145)

## 2022-06-30 LAB — CULTURE, BLOOD (ROUTINE X 2)

## 2022-06-30 MED ORDER — ACETAMINOPHEN 500 MG PO TABS: 1000.0000 mg | ORAL_TABLET | Freq: Three times a day (TID) | ORAL | Status: AC | PRN

## 2022-06-30 MED ORDER — POTASSIUM CHLORIDE CRYS ER 20 MEQ PO TBCR
40.0000 meq | EXTENDED_RELEASE_TABLET | Freq: Once | ORAL | Status: AC
  Administered 2022-06-30: 40 meq via ORAL
  Filled 2022-06-30: qty 2

## 2022-06-30 MED ORDER — CEFADROXIL 500 MG PO CAPS: 500.0000 mg | ORAL_CAPSULE | Freq: Two times a day (BID) | ORAL | 0 refills | Status: AC

## 2022-06-30 MED ORDER — TRAMADOL HCL 50 MG PO TABS: 50.0000 mg | ORAL_TABLET | Freq: Four times a day (QID) | ORAL | 0 refills | Status: DC | PRN

## 2022-06-30 MED ORDER — CIPROFLOXACIN HCL 500 MG PO TABS: 500.0000 mg | ORAL_TABLET | Freq: Two times a day (BID) | ORAL | 0 refills | Status: AC

## 2022-06-30 NOTE — Care Management Important Message (Signed)
Important Message  Patient Details  Name: Nicholas [name]: [id number] Date of Birth: 1945-05-25   Medicare Important Message Given:  Yes     Johnell Comings 06/30/2022, 10:56 AM

## 2022-06-30 NOTE — Plan of Care (Signed)
IV removed, discharge instructions reviewed, patient to be discharged to home

## 2022-06-30 NOTE — Discharge Summary (Signed)
Physician Discharge Summary   Patient: Nicholas [name] MRN: [id number] DOB: 1945/07/30  Admit date:     06/26/2022  Discharge date: 06/30/22  Discharge Physician: Pennie Banter   PCP: Danella Penton, MD   Recommendations at discharge:  {Tip this will not be part of the note when signed- Example include specific recommendations for outpatient follow-up, pending tests to follow-up on. (Optional):26781}  Follow up as scheduled with Podiatry  Discharge Diagnoses: Principal Problem:   Sepsis due to cellulitis Active Problems:   Paroxysmal atrial fibrillation   History of TIA (transient ischemic attack)   Peripheral neuropathy   Gout   Cellulitis of right leg   Cellulitis of toe of right foot   Hypokalemia  Resolved Problems:   * No resolved hospital problems. [facility] Course: No notes on file  Assessment and Plan: * Sepsis due to cellulitis - The patient will be admitted to a medical telemetry bed. - Sepsis manifested by leukocytosis and tachycardia. - This is secondary to right second toe and leg cellulitis. - We will continue antibiotic therapy with IV Rocephin. - Warm compresses will be applied. - We will follow blood cultures. - Pain management will be provided. - We will obtain podiatry consult. - I notified Dr. Excell Seltzer about the patient.  Paroxysmal atrial fibrillation - We will continue Xarelto and atenolol.  History of TIA (transient ischemic attack) - We will continue baby aspirin.  Peripheral neuropathy - We will continue Neurontin.  Gout - We will continue Uloric and as needed colchicine.      {Tip this will not be part of the note when signed Body mass index is 22.64 kg/m. , ,  (Optional):26781}  {(NOTE) Pain control PDMP Statment (Optional):26782} Consultants: *** Procedures performed: ***  Disposition: {Plan; Disposition:26390} Diet recommendation:  Discharge Diet Orders (From admission, onward)     Start     Ordered   06/30/22 0000   Diet - low sodium heart healthy        06/30/22 1018           {Diet_Plan:26776} DISCHARGE MEDICATION: Allergies as of 06/30/2022       Reactions   Penicillins Other (See Comments)   Tolerated ceftriaxone 06/27/22   Allopurinol Hives        Medication List     STOP taking these medications    cephALEXin 500 MG capsule Commonly known as: KEFLEX   hydrocortisone 2.5 % cream       TAKE these medications    acetaminophen 500 MG tablet Commonly known as: TYLENOL Take 2 tablets (1,000 mg total) by mouth every 8 (eight) hours as needed for mild pain (or Fever >/= 101).   aspirin EC 81 MG tablet Take 81 mg by mouth daily. Swallow whole.   atenolol 25 MG tablet Commonly known as: TENORMIN Take 12.5 mg by mouth 2 (two) times daily.   cefadroxil 500 MG capsule Commonly known as: DURICEF Take 1 capsule (500 mg total) by mouth 2 (two) times daily for 7 days.   ciprofloxacin 500 MG tablet Commonly known as: Cipro Take 1 tablet (500 mg total) by mouth 2 (two) times daily for 7 days.   colchicine 0.6 MG tablet Take 0.6 mg by mouth as needed.   febuxostat 40 MG tablet Commonly known as: ULORIC Take 40 mg by mouth daily.   gabapentin 100 MG capsule Commonly known as: NEURONTIN Take 200 mg by mouth 2 (two) times daily.   multivitamin-lutein Caps capsule Take 1  capsule by mouth daily.   traMADol 50 MG tablet Commonly known as: Ultram Take 1 tablet (50 mg total) by mouth every 6 (six) hours as needed.   vitamin C 250 MG tablet Commonly known as: ASCORBIC ACID Take 1 tablet (250 mg total) by mouth 2 (two) times daily.   Xarelto 20 MG Tabs tablet Generic drug: rivaroxaban Take 20 mg by mouth daily.   zinc sulfate 220 (50 Zn) MG capsule Commonly known as: Zinc-220 Take 1 capsule (220 mg total) by mouth daily.        Follow-up Information     Rosetta Posner, DPM. Schedule an appointment as soon as possible for a visit in 1 week(s).   Specialty:  Podiatry Why: For wound re-check Contact information: [address] [place] [address] [phone]                Discharge Exam: Ceasar Mons Weights   06/26/22 2024  Weight: 84.4 kg   ***  Condition at discharge: {DC Condition:26389}  The results of significant diagnostics from this hospitalization (including imaging, microbiology, ancillary and laboratory) are listed below for reference.   Imaging Studies: DG Foot 2 Views Right  Result Date: 06/28/2022 CLINICAL DATA:  Status post partial amputation of the second toe. EXAM: RIGHT FOOT - 2 VIEW COMPARISON:  Radiograph dated 06/27/2022. 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 There is no acute fracture or dislocation. The bones are osteopenic. There is soft tissue swelling of the forefoot. 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 Electronically Signed   By: Elgie Collard M.D.   On: 06/28/2022 19:18   MR FOOT RIGHT WO CONTRAST  Result Date: 06/27/2022 CLINICAL DATA:  Foot swelling, diabetic, osteomyelitis suspected. Acute onset of worsening right 2nd toe swelling and erythema. History of gout. EXAM: MRI OF THE RIGHT FOREFOOT WITHOUT CONTRAST TECHNIQUE: Multiplanar, multisequence MR imaging of the right forefoot was performed. No intravenous contrast was administered. COMPARISON:  Radiographs same date.  No other comparison studies. FINDINGS: Bones/Joint/Cartilage There is abnormal marrow T2 hyperintensity within the distal 2nd phalanx. Questionable small erosion of the distal tuft on the T1 weighted images without gross cortical destruction. The middle and proximal phalanges appear normal. The other toes and metatarsals appear normal. There are no significant joint effusions. Mild degenerative changes at the 1st metatarsophalangeal joint. Ligaments The Lisfranc ligament is intact. The collateral ligaments of the metatarsophalangeal joints are intact. 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 No significant tenosynovitis. Soft tissues Nonspecific dorsal subcutaneous edema throughout the forefoot. Soft tissue swelling extends into the 2nd toe where there may be mild skin ulceration distally. No focal fluid collection or foreign body identified. IMPRESSION: 1. Soft tissue swelling in the 2nd toe with possible skin ulceration distally. Correlate clinically. 2. Marrow T2 hyperintensity within the distal 2nd phalanx with possible small erosion of the distal tuft, suspicious for early osteomyelitis. 3. The other toes and metatarsals appear normal. 4. Nonspecific dorsal subcutaneous edema throughout the forefoot. No focal fluid collection. Electronically Signed   By: Carey Bullocks M.D.   On: 06/27/2022 14:56   DG Foot 2 Views Right  Result Date: 06/27/2022 CLINICAL DATA:  Right foot cellulitis.  [id number]. EXAM: RIGHT FOOT - 2 VIEW COMPARISON:  None Available. FINDINGS: There is moderate generalized edema. There is no appreciable soft tissue gas. No foreign body is seen. There are scattered  vascular calcifications in the posterior tibial artery. There are faint vascular calcifications in the forefoot. The bone mineralization is normal. No fractures or destructive lesions are seen. There are mild features of degenerative arthrosis in the forefoot, on the lateral view mild midfoot arthrosis and a small plantar calcaneal spur. There are small rounded dystrophic calcifications in the plantar fascial plane. There is a tiny dystrophic calcification medial to the first MTP joint. IMPRESSION: 1. Moderate generalized edema. No soft tissue gas or visible foreign body. 2. Vascular calcifications. 3. Mild degenerative changes and small plantar calcaneal spur. Electronically Signed   By: Almira Bar M.D.   On: 06/27/2022 06:41   US Venous Img Lower Unilateral Right  Result Date:  06/26/2022 CLINICAL DATA:  Suspected right lower extremity DVT. EXAM: RIGHT LOWER EXTREMITY VENOUS DOPPLER ULTRASOUND TECHNIQUE: Gray-scale sonography with compression, as well as color and duplex ultrasound, were performed to evaluate the deep venous system(s) from the level of the common femoral vein through the popliteal and proximal calf veins. COMPARISON:  None Available. FINDINGS: VENOUS Normal compressibility of the common femoral, superficial femoral, and popliteal veins, as well as the visualized calf veins. Visualized portions of profunda femoral vein and great saphenous vein unremarkable. No filling defects to suggest DVT on grayscale or color Doppler imaging. Doppler waveforms show normal direction of venous flow, normal respiratory plasticity and response to augmentation. Limited views of the contralateral common femoral vein are unremarkable. OTHER There are fatty replaced benign-appearing right inguinal chain nodes. Largest of these is 2.7 x 1.7 x 3.9 cm. Limitations: none IMPRESSION: No evidence of right lower extremity DVT. Fatty replaced inguinal chain nodes. Electronically Signed   By: Almira Bar M.D.   On: 06/26/2022 23:30    Microbiology: Results for orders placed or performed during the hospital encounter of 06/26/22  Blood culture (routine x 2)     Status: None (Preliminary result)   Collection Time: 06/26/22 11:10 PM   Specimen: BLOOD  Result Value Ref Range Status   Specimen Description BLOOD LEFT ARM  Final   Special Requests   Final    BOTTLES DRAWN AEROBIC AND ANAEROBIC Blood Culture adequate volume   Culture   Final    NO GROWTH 3 DAYS Performed at [facility], [address], [place], [place] [address]    Report Status PENDING  Incomplete  Blood culture (routine x 2)     Status: None (Preliminary result)   Collection Time: 06/26/22 11:15 PM   Specimen: BLOOD  Result Value Ref Range Status   Specimen Description BLOOD RIGHT ARM  Final   Special  Requests   Final    BOTTLES DRAWN AEROBIC AND ANAEROBIC Blood Culture adequate volume   Culture   Final    NO GROWTH 3 DAYS Performed at [facility], [address], [place], [place] [address]    Report Status PENDING  Incomplete  Aerobic/Anaerobic Culture w Gram Stain (surgical/deep wound)     Status: None (Preliminary result)   Collection Time: 06/27/22  1:49 PM   Specimen: Foot; Wound  Result Value Ref Range Status   Specimen Description   Final    FOOT Performed at [facility], [address], [place], [place] [address]    Special Requests   Final    NONE Performed at [facility], [address] [place], [place] [address]    Gram Stain   Final    NO WBC SEEN NO ORGANISMS SEEN Performed at [facility], [address] [place], [place] [address]    Culture   Final    MODERATE STAPHYLOCOCCUS AUREUS RARE ENTEROBACTER CLOACAE NO ANAEROBES ISOLATED; CULTURE IN PROGRESS FOR 5 DAYS    Report Status PENDING  Incomplete   Organism ID, Bacteria STAPHYLOCOCCUS AUREUS  Final   Organism ID, Bacteria ENTEROBACTER CLOACAE  Final      Susceptibility   Enterobacter cloacae - MIC*    CEFEPIME <=0.12 SENSITIVE Sensitive     CEFTAZIDIME <=1 SENSITIVE Sensitive     CIPROFLOXACIN <=0.25 SENSITIVE Sensitive     GENTAMICIN <=1 SENSITIVE Sensitive     IMIPENEM <=0.25 SENSITIVE Sensitive     TRIMETH/SULFA <=20 SENSITIVE Sensitive     PIP/TAZO <=4 SENSITIVE Sensitive     * RARE ENTEROBACTER CLOACAE   Staphylococcus aureus - MIC*    CIPROFLOXACIN <=0.5 SENSITIVE Sensitive     ERYTHROMYCIN <=0.25 SENSITIVE Sensitive     GENTAMICIN <=0.5 SENSITIVE Sensitive     OXACILLIN <=0.25 SENSITIVE Sensitive     TETRACYCLINE <=1 SENSITIVE Sensitive     VANCOMYCIN 1 SENSITIVE Sensitive     TRIMETH/SULFA <=10 SENSITIVE Sensitive     CLINDAMYCIN <=0.25 SENSITIVE Sensitive     RIFAMPIN <=0.5 SENSITIVE Sensitive     Inducible Clindamycin NEGATIVE Sensitive     *  MODERATE STAPHYLOCOCCUS AUREUS  Surgical PCR screen     Status: Abnormal   Collection Time: 06/27/22  8:30 PM   Specimen: Nasal Mucosa; Nasal Swab  Result Value Ref Range Status   MRSA, PCR NEGATIVE NEGATIVE Final   Staphylococcus aureus POSITIVE (A) NEGATIVE Final    Comment: (NOTE) The Xpert SA Assay (FDA approved for NASAL specimens in patients 26 years of age and older), is one component of a comprehensive surveillance program. It is not intended to diagnose infection nor to guide or monitor treatment. Performed at [facility], [address] [place], [place] [address]     Labs: CBC: Recent Labs  Lab 06/26/22 2026 06/27/22 0834 06/28/22 0441 06/29/22 0455 06/30/22 0509  WBC 17.0* 9.9 6.9 6.7 6.6  NEUTROABS 14.7*  --   --   --   --   HGB 15.3 13.3 13.3 13.5 13.5  HCT 45.7 39.3 39.6 39.8 40.0  MCV 96.4 96.1 95.9 95.2 95.2  PLT 216 182 161 195 219   Basic Metabolic Panel: Recent Labs  Lab 06/26/22 2026 06/27/22 0834 06/28/22 0441 06/29/22 0455 06/30/22 0509  NA 136 136 138 137 137  K 4.2 3.6 3.9 3.7 3.4*  CL 103 104 107 107 109  CO2 21* 20*  GLUCOSE 115* 98 93 98 135*  BUN CREATININE 1.25* 1.09 1.04 0.98 1.04  CALCIUM 9.4 8.7* 8.8* 8.8* 8.6*  MG  --  1.8 1.9  --   --   PHOS  --  3.2 3.7  --   --    Liver Function Tests: Recent Labs  Lab 06/26/22 2026  AST 28  ALT 20  ALKPHOS 56  BILITOT 1.6*  PROT 6.6  ALBUMIN 4.0   CBG: No results for input(s): "GLUCAP" in the last 168 hours.  Discharge time spent: {LESS THAN/GREATER NWGN:56213} 30 minutes.  Signed: Pennie Banter, DO [facility] 06/30/2022

## 2022-06-30 NOTE — TOC Initial Note (Signed)
Transition of Care [facility]) - Initial/Assessment Note    Patient Details  Name: Nicholas [name] MRN: [id number] Date of Birth: 1946-01-24  Transition of Care [facility] CM/SW Contact:    Chapman Fitch, RN Phone Number: 06/30/2022, 10:51 AM  Clinical Narrative:                  Patient to discharge today Home health arranged through [facility] home health Patient states that he will have a ride home, he just isnt sure who will be taking him yet  Patient confirms he has a RW and BSC in the home  Expected Discharge Plan: Home w Home Health Services Barriers to Discharge: Barriers Resolved   Patient Goals and CMS Choice Patient states their goals for this hospitalization and ongoing recovery are:: to go home today CMS Medicare.gov Compare Post Acute Care list provided to:: Patient Choice offered to / list presented to : Patient Hope ownership interest in [facility].provided to:: Patient    Expected Discharge Plan and Services     Post Acute Care Choice: Home Health Living arrangements for the past 2 months: Single Family Home Expected Discharge Date: 06/30/22                         HH Arranged: PT, OT HH Agency: [facility] Date HH Agency Contacted: 06/30/22   Representative spoke with at [facility] Agency: [place]  Prior Living Arrangements/Services Living arrangements for the past 2 months: Single Family Home Lives with:: Spouse Patient language and need for interpreter reviewed:: Yes Do you feel safe going back to the place where you live?: Yes      Need for Family Participation in Patient Care: Yes (Comment)   Current home services: DME Criminal Activity/Legal Involvement Pertinent to Current Situation/Hospitalization: No - Comment as needed  Activities of Daily Living Home Assistive Devices/Equipment: CPAP, Eyeglasses ADL Screening (condition at time of admission) Patient's cognitive ability adequate to safely complete daily activities?:  Yes Is the patient deaf or have difficulty hearing?: No Does the patient have difficulty seeing, even when wearing glasses/contacts?: No Does the patient have difficulty concentrating, remembering, or making decisions?: No Patient able to express need for assistance with ADLs?: Yes Does the patient have difficulty dressing or bathing?: No Independently performs ADLs?: Yes (appropriate for developmental age) Does the patient have difficulty walking or climbing stairs?: Yes Weakness of Legs: Right Weakness of Arms/Hands: None  Permission Sought/Granted                  Emotional Assessment       Orientation: : Oriented to Self, Oriented to Place, Oriented to  Time, Oriented to Situation      Admission diagnosis:  Sepsis due to cellulitis [L03.90, A41.9] Cellulitis, unspecified cellulitis site [L03.90] Patient Active Problem List   Diagnosis Date Noted   Hypokalemia 06/30/2022   Paroxysmal atrial fibrillation 06/27/2022   Gout 06/27/2022   Peripheral neuropathy 06/27/2022   History of TIA (transient ischemic attack) 06/27/2022   Cellulitis of right leg 06/27/2022   Cellulitis of toe of right foot 06/27/2022   Sepsis due to cellulitis 06/26/2022   COVID-19 virus infection 02/17/2021   A-fib    TIA (transient ischemic attack)    Lactic acidosis    Hyponatremia    Hypotension    Generalized weakness    PCP:  Danella Penton, MD Pharmacy:   [id number] - [place], [place] - [phone] [facility] [address] [place] [address] Phone: [phone] Fax: [phone]  [facility] - [place], [place] - [address] [place] [address] Phone: [phone] Fax: [phone]     Social Determinants of Health (SDOH) Social History: SDOH Screenings   Food Insecurity: No Food Insecurity (06/27/2022)  Housing: Low Risk  (06/27/2022)  Transportation Needs: No Transportation  Needs (06/27/2022)  Utilities: Not At Risk (06/27/2022)  Tobacco Use: Low Risk  (06/29/2022)   SDOH Interventions:     Readmission Risk Interventions     No data to display

## 2022-06-30 NOTE — Progress Notes (Signed)
PODIATRY / FOOT AND ANKLE SURGERY PROGRESS NOTE  Requesting Physician: Dr. Lucianne Muss  Reason for consult: Right foot infection/swelling  Chief Complaint: Right foot infection/leg swelling and redness   HPI: Nicholas [name] is a 77 y.o. male who presents today for follow-up status post right second toe amputation partial.  Patient is resting in bed comfortably.  Patient notes that he is feeling pretty good.  Patient is kept dressings clean and intact and has been weightbearing in surgical shoe.  Patient also has a walker at bedside has been using.  Patient currently denies nausea, vomiting, fevers, chills.  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   Dysrhythmia    Pulmonary embolism    Skin cancer    TIA (transient ischemic attack)     Surgical Hx:  Past Surgical History:  Procedure Laterality Date   AMPUTATION TOE Right 06/28/2022   Procedure: RIGHT SECOND TOE AMPUTATION;  Surgeon: Rosetta Posner, DPM;  Location: [facility];  Service: Podiatry;  Laterality: Right;   APPENDECTOMY     COLONOSCOPY WITH PROPOFOL N/A 11/23/2019   Procedure: COLONOSCOPY WITH PROPOFOL;  Surgeon: Toledo, Boykin Nearing, MD;  Location: [facility];  Service: Gastroenterology;  Laterality: N/A;   cyber knife surgery     PROSTATE SURGERY      FHx:  Family History  Problem Relation Age of Onset   Hypertension Mother     Social History:  reports that he has never smoked. He has never used smokeless tobacco. He reports that he does not drink alcohol and does not use drugs.  Allergies:  Allergies  Allergen Reactions   Penicillins Other (See Comments)    Tolerated ceftriaxone 06/27/22   Allopurinol Hives    Medications Prior to Admission  Medication Sig Dispense Refill   aspirin EC 81 MG tablet Take 81 mg by mouth daily. Swallow whole.     atenolol (TENORMIN) 25 MG tablet Take 12.5 mg by mouth 2 (two) times daily.      colchicine 0.6 MG tablet Take 0.6 mg by mouth as needed.     febuxostat (ULORIC) 40 MG tablet Take 40 mg by mouth daily.     gabapentin (NEURONTIN) 100 MG capsule Take 200 mg by mouth 2 (two) times daily.     multivitamin-lutein (OCUVITE-LUTEIN) CAPS capsule Take 1 capsule by mouth daily.     vitamin C (ASCORBIC ACID) 250 MG tablet Take 1 tablet (250 mg total) by mouth 2 (two) times daily. 60 tablet 2   XARELTO 20 MG TABS tablet Take 20 mg by mouth daily.     zinc sulfate (ZINC-220) 220 (50 Zn) MG capsule Take 1 capsule (220 mg total) by mouth daily. 90 capsule 0   cephALEXin (KEFLEX) 500 MG capsule Take 500 mg by mouth 3 (three) times daily. (Patient not taking: Reported on 06/26/2022)     ciprofloxacin (CIPRO) 500 MG tablet Take 1 tablet by mouth 2 (two) times daily. (Patient not taking: Reported on 06/26/2022)     hydrocortisone 2.5 % cream APPLY EXTERNALLY TO THE AFFECTED AREA EVERY DAY ON TUES, THURSDAY, SATURDAY AS DIRECTED (Patient not taking: Reported on 06/26/2022) 30 g 11     Physical Exam: General: Alert and oriented.  No apparent distress.  Vascular: DP/PT pulses palpable bilateral, capillary fill time intact to digits bilaterally.  Reduction in erythema and edema present to the right lower leg.  Neuro: Light touch  sensation nearly absent to bilateral lower extremities.  Derm: Right second toe amputation site appears to be well coapted with sutures intact, no dehiscence.  Erythema and edema reduced significantly to the right second toe as well as the right lower leg, appears to be greatly improved.    MSK: 4/5 strength bilateral lower extremity muscle groups.  Right second toe partial amputation.  Hammertoe contractures digits 2 through 5 bilateral.  Results for orders placed or performed during the hospital encounter of 06/26/22 (from the past 48 hour(s))  CBC     Status: Abnormal   Collection Time: 06/29/22  4:55 AM  Result Value Ref Range   WBC 6.7 4.0 - 10.5 K/uL   RBC  4.18 (L) 4.22 - 5.81 MIL/uL   Hemoglobin 13.5 13.0 - 17.0 g/dL   HCT 19.1 47.8 - 29.5 %   MCV 95.2 80.0 - 100.0 fL   MCH 32.3 26.0 - 34.0 pg   MCHC 33.9 30.0 - 36.0 g/dL   RDW 62.1 30.8 - 65.7 %   Platelets 195 150 - 400 K/uL   nRBC 0.0 0.0 - 0.2 %    Comment: Performed at [facility], [address], [place], [place] [address]  Basic metabolic panel     Status: Abnormal   Collection Time: 06/29/22  4:55 AM  Result Value Ref Range   Sodium 137 135 - 145 mmol/L   Potassium 3.7 3.5 - 5.1 mmol/L   Chloride 107 98 - 111 mmol/L   CO2 21 (L) 22 - 32 mmol/L   Glucose, Bld 98 70 - 99 mg/dL    Comment: Glucose reference range applies only to samples taken after fasting for at least 8 hours.   BUN 15 8 - 23 mg/dL   Creatinine, Ser 2.95 0.61 - 1.24 mg/dL   Calcium 8.8 (L) 8.9 - 10.3 mg/dL   GFR, Estimated >28 >41 mL/min    Comment: (NOTE) Calculated using the CKD-EPI Creatinine Equation (2021)    Anion gap 9 5 - 15    Comment: Performed at [facility], [address] [place], [place] [address]  CBC     Status: Abnormal   Collection Time: 06/30/22  5:09 AM  Result Value Ref Range   WBC 6.6 4.0 - 10.5 K/uL   RBC 4.20 (L) 4.22 - 5.81 MIL/uL   Hemoglobin 13.5 13.0 - 17.0 g/dL   HCT 10.2 72.5 - 36.6 %   MCV 95.2 80.0 - 100.0 fL   MCH 32.1 26.0 - 34.0 pg   MCHC 33.8 30.0 - 36.0 g/dL   RDW 44.0 34.7 - 42.5 %   Platelets 219 150 - 400 K/uL   nRBC 0.0 0.0 - 0.2 %    Comment: Performed at [facility], [address], [place], [place] [address]  Basic metabolic panel     Status: Abnormal   Collection Time: 06/30/22  5:09 AM  Result Value Ref Range   Sodium 137 135 - 145 mmol/L   Potassium 3.4 (L) 3.5 - 5.1 mmol/L   Chloride 109 98 - 111 mmol/L   CO2 20 (L) 22 - 32 mmol/L   Glucose, Bld 135 (H) 70 - 99 mg/dL    Comment: Glucose reference range applies only to samples taken after fasting for at least 8 hours.   BUN 18 8 - 23 mg/dL   Creatinine, Ser 7.56  0.61 - 1.24 mg/dL   Calcium 8.6 (L) 8.9 - 10.3 mg/dL   GFR, Estimated >43 >32  mL/min    Comment: (NOTE) Calculated using the CKD-EPI Creatinine Equation (2021)    Anion gap 8 5 - 15    Comment: Performed at [facility], [address] [place], [place] [address]   DG Foot 2 Views Right  Result Date: 06/28/2022 CLINICAL DATA:  Status post partial amputation of the second toe. EXAM: RIGHT FOOT - 2 VIEW COMPARISON:  Radiograph dated 06/27/2022. 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 There is no acute fracture or dislocation. The bones are osteopenic. There is soft tissue swelling of the forefoot. 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 Electronically Signed   By: Elgie Collard M.D.   On: 06/28/2022 19:18    Blood pressure 119/89, pulse (!) 102, temperature 97.9 F (36.6 C), temperature source Oral, resp. rate 18, height  (1.93 m), weight 84.4 kg, SpO2 96 %.  Assessment R 2nd toe cellulitis with associated DFU 2nd toe distal tip status post partial amputation right second toe RLE cellulitis Neuropathy  Plan -Patient seen and examined. -X-ray imaging reviewed postoperatively.  No further evidence of infection based on x-ray imaging after surgery. -Believe that all infection was likely removed with amputation based on intraoperative findings as well as MRI that only showed infection to the distal tip of the right second toe.  Cellulitic changes to the right second toe and right lower leg appear to be greatly improved overall. -Incision line into the right second toe appears to be well coapted with sutures intact, no dehiscence or drainage present. -Wound culture growing MSSA and Enterobacter.  Recommend sending home on 7-day course of Keflex and ciprofloxacin. -Patient can be weightbearing as tolerated in surgical shoe.  PT/OT has been ordered.  Appreciate recommendations. -Patient to keep dressings clean,  dry, and intact until visit from hospital in outpatient setting.  Would like to see patient within the next week for a dressing change and further evaluation.  Sutures will not be removed until 2 weeks after surgical date.  Podiatry team to sign off at this time.  Patient can be discharged when deemed medically stable from hospitalist perspective.  Rosetta Posner, DPM 06/30/2022, 8:45 AM

## 2022-07-01 LAB — CULTURE, BLOOD (ROUTINE X 2): Special Requests: ADEQUATE

## 2022-07-01 LAB — SURGICAL PATHOLOGY

## 2022-07-02 LAB — AEROBIC/ANAEROBIC CULTURE W GRAM STAIN (SURGICAL/DEEP WOUND)

## 2022-07-03 ENCOUNTER — Ambulatory Visit: Payer: Medicare Other

## 2022-07-10 ENCOUNTER — Ambulatory Visit: Payer: Medicare Other

## 2022-07-17 ENCOUNTER — Ambulatory Visit: Payer: Medicare Other

## 2022-07-24 ENCOUNTER — Ambulatory Visit: Payer: Medicare Other

## 2022-07-25 ENCOUNTER — Ambulatory Visit
Admission: RE | Admit: 2022-07-25 | Discharge: 2022-07-25 | Disposition: A | Discharge status: Home / Self Care | Payer: Medicare PPO | Source: Ambulatory Visit | Attending: Orthopedic Surgery | Admitting: Orthopedic Surgery

## 2022-07-25 DIAGNOSIS — M19012 Primary osteoarthritis, left shoulder: Secondary | ICD-10-CM

## 2022-07-31 ENCOUNTER — Ambulatory Visit: Payer: Medicare Other

## 2022-08-07 ENCOUNTER — Ambulatory Visit: Payer: Medicare Other

## 2022-08-14 ENCOUNTER — Ambulatory Visit: Payer: Medicare Other

## 2022-08-21 ENCOUNTER — Ambulatory Visit: Payer: Medicare Other

## 2022-08-26 ENCOUNTER — Ambulatory Visit: Payer: Medicare PPO | Admitting: Physical Therapy

## 2022-08-26 ENCOUNTER — Ambulatory Visit: Payer: Medicare PPO | Attending: Surgery

## 2022-08-26 DIAGNOSIS — G8929 Other chronic pain: Secondary | ICD-10-CM | POA: Insufficient documentation

## 2022-08-26 DIAGNOSIS — M25512 Pain in left shoulder: Secondary | ICD-10-CM | POA: Diagnosis present

## 2022-08-26 DIAGNOSIS — M25612 Stiffness of left shoulder, not elsewhere classified: Secondary | ICD-10-CM | POA: Diagnosis present

## 2022-08-26 NOTE — Therapy (Signed)
[facility] [address] [place], [address] Phone: [phone]   Fax:  [phone]  Physical Therapy Evaluation  Patient Details  Name: Nicholas [name] MRN: [id number] Date of Birth: 04/30/1945 Referring Provider (PT): Poggi, Excell Seltzer, MD   Encounter Date: 08/26/2022   PT End of Session - 08/26/22 1522     Visit Number 1    Number of Visits 17    Date for PT Re-Evaluation 10/24/22    PT Start Time 1523    PT Stop Time 1606    PT Time Calculation (min) 43 min    Activity Tolerance Patient tolerated treatment well    Behavior During Therapy [facility] for tasks assessed/performed             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   Dysrhythmia    Pulmonary embolism (HCC)    Skin cancer    TIA (transient ischemic attack)     Past Surgical History:  Procedure Laterality Date   AMPUTATION TOE Right 06/28/2022   Procedure: RIGHT SECOND TOE AMPUTATION;  Surgeon: Rosetta Posner, DPM;  Location: [facility];  Service: Podiatry;  Laterality: Right;   APPENDECTOMY     COLONOSCOPY WITH PROPOFOL N/A 11/23/2019   Procedure: COLONOSCOPY WITH PROPOFOL;  Surgeon: Toledo, Boykin Nearing, MD;  Location: [facility];  Service: Gastroenterology;  Laterality: N/A;   cyber knife surgery     PROSTATE SURGERY      There were no vitals filed for this visit.    Subjective Assessment - 08/26/22 1524     Subjective L shoulder lateral and posterior: 0/10 currently at rest, 7/10 at worst for the past 3 months (such as reaching behind him).    Pertinent History L shoulder pain. 10 years ago, pt went down a shoot in a museum with his grandchildren and had to hold onto a round tube and had to maintain a sustained pull up which strained his L shoulder. The shoulder got better but got worse after the last year or so. Was doing PT at the hospital but was  discharged secondary to progress. MRI showed arthritis in L shoulder. Doctor said for pt to keep using his L shoulder until he cannot tolerate it anymore and might get shots and possibly surgery. 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 Feels ok the day of doing exercises but shoulder bothers him the next day. Pain has gradually worsened for the past year. Pt is L hand dominant.    Patient Stated Goals Not have the L shoulder ache when moving his arm.    Currently in Pain? No/denies    Pain Score 0-No pain    Pain Location Shoulder    Pain Orientation Left    Pain Descriptors / Indicators Aching;Dull    Pain Type Chronic pain    Pain Onset More than a month ago    Pain Frequency Occasional    Aggravating Factors  abductoin: lateral shoulder pain. reaching back such as abduction and extension: posterior shoulder pain.  moving L arm after static position; reaching behind him such as putting a belt on.    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 1   at "Tax day", pt was wearing an orthopedic shoe and feet got tangled up wiht his orthopedic shoe while turning.   Has the patient had a decrease in activity level because of a fear of falling?  No    Is the patient reluctant to  leave their home because of a fear of falling?  No      Posture/Postural Control   Posture Comments B protracted shoulders, kyphosis      AROM   Left Shoulder Flexion 118 Degrees   AAROM 122, stiff end feel, posterior lateral shoulder pain   Left Shoulder ABduction 120 Degrees   134 AAROM, stiff end feel, posterior shoulder symptoms.   Left Shoulder Internal Rotation --   Functional IR: L thumb to T11 spinous process   Left Shoulder External Rotation --   Functional ER: L middle finger to C6 spinous process.     Strength   Overall Strength Comments Seated manually resisted scapular retraction targeting lower trap muscle: 4/5 L.    Left Shoulder Flexion 5/5    Left Shoulder ABduction 4/5   with posterior superior shoulder pain   Left Shoulder Internal Rotation 4/5   with posterior lateral shoulder pain   Left Shoulder External Rotation 4/5   with posterior lateral shoulder pain     Special Tests   Other special tests (-) empty can, (+) Hawkins-Kennedy with posterior L shoulder pain.                        Objective measurements completed on examination: See above findings.   Towel IR stretch bothers his shoulder a lot.   Therapeutic exercise   Seated L shoulder IR isometrics, hand on abdomen 10x5 sec  Improved exercise technique, movement at target joints, use of target muscles after mod verbal, visual, tactile cues.    Response to treatment Pt tolerated session well without aggravation of symptoms.    Clinical impression Pt is a 77 year old male who came to physical therapy secondary to chronic L shoulder pain. He also demonstrates altered posture, L shoulder stiffness, decreased L shoulder ROM, ER, IR, and scapular weakness, improper scapular mechanics (tendency to over activate upper trap muscles and shrug his scapula), and difficulty reaching up, to the side, and back secondary to pain. Pt will benefit from skilled physical therapy services to address the  aforementioned deficits.                           PT Education - 08/26/22 1627     Education Details ther-ex, POC    Person(s) Educated Patient    Methods Explanation;Demonstration;Tactile cues;Verbal cues    Comprehension Verbalized understanding;Returned demonstration  PT Short Term Goals - 08/26/22 1619       PT SHORT TERM GOAL #1   Title Pt will be independent with his initial HEP to decrease pain, improve ROM, strength and ability to reach more comfortably.    Time 3    Period Weeks    Status New    Target Date 09/19/22               PT Long Term Goals - 08/26/22 1620       PT LONG TERM GOAL #1   Title Pt will have a decrease in L shoulder pain to 4/10 or less at worst to promote ability to reach up, to the side, and behind him more comfortably.    Baseline 7/10 L shoulder pain at worst for the past 3 months (08/26/2022)    Time 8    Period Weeks    Status New    Target Date 10/24/22      PT LONG TERM GOAL #2   Title Pt will improve L shoulder functional IR to L thumb to T8 spinous process to promote ability to reach behind his back as well as don and doff clothes more comfortably.    Baseline L shoulder functional IR: L thumb to T11 spinous process (08/26/2022)    Time 8    Period Weeks    Status New    Target Date 10/24/22      PT LONG TERM GOAL #3   Title Pt will improve L shoulder flexion and abduction AROM by at least 20 degrees to promote ability to reach more comfortably.    Baseline L shoulder AROM 118 degrees flexion, 120 degrees abduction (08/26/2022)    Time 8    Period Weeks    Status New    Target Date 10/24/22      PT LONG TERM GOAL #4   Title Pt will improve L shoulder ER and IR strength by at least 1/2 MMT strength to promote ability to raise his arm with less pain.    Baseline ER 4/5, IR 4/5 (08/26/2022)    Time 8    Period Weeks    Status New    Target Date 10/24/22                     Plan - 08/26/22 1628     Clinical Impression Statement Pt is a 77 year old male who came to physical therapy secondary to chronic L shoulder pain. He also demonstrates altered posture, L shoulder stiffness, decreased L shoulder ROM, ER, IR, and scapular weakness, improper scapular mechanics (tendency to over activate upper trap muscles and shrug his scapula), and difficulty reaching up, to the side, and back secondary to pain. Pt will benefit from skilled physical therapy services to address the aforementioned deficits.    Personal Factors and Comorbidities Comorbidity 3+;Age;Past/Current Experience;Time since onset of injury/illness/exacerbation;Fitness    Comorbidities A-fib, prostate CA, arthritis, PE, TIA    Examination-Activity Limitations Bathing;Reach Overhead;Dressing;Hygiene/Grooming;Toileting;Caring for Others;Lift;Carry    Stability/Clinical Decision Making Stable/Uncomplicated    Clinical Decision Making Low    Rehab Potential Fair    PT Frequency 2x / week    PT Duration 8 weeks    PT Treatment/Interventions Therapeutic activities;Therapeutic exercise;Neuromuscular re-education;Manual techniques;Biofeedback;Electrical Stimulation;Iontophoresis 4mg /ml Dexamethasone;Patient/family education;Dry needling    PT Next Visit Plan posture, scapular, ER, IR strengthening, thoracic extension, glenohumeral mechanics, manual techniques, modalities PRN    Consulted and Agree with Plan of Care Patient  Patient will benefit from skilled therapeutic intervention in order to improve the following deficits and impairments:  Pain, Postural dysfunction, Impaired UE functional use, Improper body mechanics, Decreased strength, Decreased range of motion  Visit Diagnosis: Chronic left shoulder pain - Plan: PT plan of care cert/re-cert  Stiffness of left shoulder, not elsewhere classified - Plan: PT plan of care cert/re-cert     Problem List Patient Active Problem List   Diagnosis  Date Noted   Hypokalemia 06/30/2022   Paroxysmal atrial fibrillation (HCC) 06/27/2022   Gout 06/27/2022   Peripheral neuropathy 06/27/2022   History of TIA (transient ischemic attack) 06/27/2022   Cellulitis of right leg 06/27/2022   Cellulitis of toe of right foot 06/27/2022   Sepsis due to cellulitis (HCC) 06/26/2022   COVID-19 virus infection 02/17/2021   A-fib (HCC)    TIA (transient ischemic attack)    Lactic acidosis    Hyponatremia    Hypotension    Generalized weakness    Loralyn Freshwater PT, DPT  08/26/2022, 4:38 PM  [facility] [address], [place], [address] Phone: [phone]   Fax:  [phone]  Name: Nicholas [name] MRN: [id number] Date of Birth: [birth year]

## 2022-08-28 ENCOUNTER — Ambulatory Visit: Payer: Medicare Other

## 2022-08-29 ENCOUNTER — Ambulatory Visit: Payer: Medicare PPO

## 2022-09-01 ENCOUNTER — Ambulatory Visit: Payer: Medicare PPO

## 2022-09-01 DIAGNOSIS — M25512 Pain in left shoulder: Secondary | ICD-10-CM | POA: Diagnosis not present

## 2022-09-01 DIAGNOSIS — M25612 Stiffness of left shoulder, not elsewhere classified: Secondary | ICD-10-CM

## 2022-09-01 DIAGNOSIS — G8929 Other chronic pain: Secondary | ICD-10-CM

## 2022-09-01 NOTE — Therapy (Signed)
OUTPATIENT PHYSICAL THERAPY TREATMENT NOTE   Patient Name: Nicholas [name] MRN: [id number] DOB:[birth year], 77 y.o., male Today's Date: 09/01/2022  PCP: Danella Penton, MD  REFERRING PROVIDER: Christena Flake, MD   END OF SESSION:  PT End of Session - 09/01/22 0901     Visit Number 2    Number of Visits 17    Date for PT Re-Evaluation 10/24/22    PT Start Time 0901    PT Stop Time 0942    PT Time Calculation (min) 41 min    Activity Tolerance Patient tolerated treatment well    Behavior During Therapy [facility] for tasks assessed/performed             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   Dysrhythmia    Pulmonary embolism (HCC)    Skin cancer    TIA (transient ischemic attack)    Past Surgical History:  Procedure Laterality Date   AMPUTATION TOE Right 06/28/2022   Procedure: RIGHT SECOND TOE AMPUTATION;  Surgeon: Rosetta Posner, DPM;  Location: [facility] ORS;  Service: Podiatry;  Laterality: Right;   APPENDECTOMY     COLONOSCOPY WITH PROPOFOL N/A 11/23/2019   Procedure: COLONOSCOPY WITH PROPOFOL;  Surgeon: Toledo, Boykin Nearing, MD;  Location: [facility] END[facility];  Service: Gastroenterology;  Laterality: N/A;   cyber knife surgery     PROSTATE SURGERY     Patient Active Problem List   Diagnosis Date Noted   Hypokalemia 06/30/2022   Paroxysmal atrial fibrillation (HCC) 06/27/2022   Gout 06/27/2022   Peripheral neuropathy 06/27/2022   History of TIA (transient ischemic attack) 06/27/2022   Cellulitis of right leg 06/27/2022   Cellulitis of toe of right foot 06/27/2022   Sepsis due to cellulitis (HCC) 06/26/2022   COVID-19 virus infection 02/17/2021   A-fib (HCC)    TIA (transient ischemic attack)    Lactic acidosis    Hyponatremia    Hypotension    Generalized weakness     REFERRING DIAG:   M19.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 10 years ago, pt went [place] a shoot in a museum with his grandchildren and had to hold onto a round tube and had to maintain a sustained pull up which strained his L shoulder. The shoulder got better but got worse after the last year or so. Was doing PT at the hospital but was discharged secondary to progress. MRI showed arthritis in L shoulder. Doctor said for pt to keep using his L shoulder until he cannot tolerate it anymore and might get shots and possibly surgery. 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 Feels ok the day of doing exercises but shoulder bothers him the next day. Pain has gradually worsened for the past year. Pt is L hand   PRECAUTIONS: Fall risk   SUBJECTIVE:   SUBJECTIVE STATEMENT: Probably about to have surgery on his R 2nd toe hammer toe surgery. L shoulder wants to be sore when he moves it forward. 1/10 L shoulder joint pain  currently   PAIN:  Are you having pain? See subjective   TODAY'S TREATMENT:                                                                                                                                         DATE: 09/01/2022  Towel IR stretch bothers his shoulder a lot.        Therapeutic exercise   Seated L shoulder self inferior capsule stretch 10x3 with 5 second holds  Feels better per pt.   Seated L triceps extension isometrics on table 10x3 with 5 second holds  Good posterior translation of L humeral head palpated.   Seated B scapular retraction 10x3 with 5 second holds  Seated L shoulder IR isometrics, hand on abdomen 10x5 sec for  3 sets   Improved exercise technique, movement at target joints, use of target muscles after mod verbal, visual, tactile cues.      Response to treatment Pt tolerated session well without aggravation of symptoms.      Clinical impression Worked on improving L shoulder humeral head posterior and inferior glide via muscle activation. Good intraarticular movement palpated in L shoulder during exercises. L shoulder feels better, more loose per pt after session. Pt will benefit from continued skilled physical therapy services to decrease pain and stiffness, improve strength and function.       PATIENT EDUCATION: Education details: there-ex, HEP Person educated: Patient Education method: Explanation, Demonstration, Tactile cues, Verbal cues, and Handouts Education comprehension: verbalized understanding and returned demonstration  HOME EXERCISE PROGRAM:  Access Code: [email or web address] URL: [email or web address] Date: 09/01/2022 Prepared by: Loralyn Freshwater  Exercises - Seated Shoulder Inferior Glide  - 3 x daily - 7 x weekly - 3 sets - 10 reps - 5 seconds hold - Isometric Tricep Extension   - 3 x daily - 7 x weekly - 3 sets - 10 reps - 5 seconds hold - Seated Scapular Retraction  - 3 x daily - 7 x weekly - 3 sets - 10 reps - 5 seconds hold     PT Short Term Goals - 08/26/22 1619       PT SHORT TERM GOAL #1   Title Pt will be independent with his initial HEP to decrease pain, improve ROM, strength and ability to reach more comfortably.    Time 3    Period Weeks    Status New    Target Date 09/19/22              PT Long Term Goals - 08/26/22 1620       PT LONG TERM GOAL #1   Title Pt will have a decrease in L shoulder pain to 4/10 or less at worst to promote ability to reach up, to the side, and behind him more comfortably.    Baseline 7/10 L shoulder pain at worst for the past 3 months (08/26/2022)  Time 8    Period Weeks    Status New    Target Date 10/24/22       PT LONG TERM GOAL #2   Title Pt will improve L shoulder functional IR to L thumb to T8 spinous process to promote ability to reach behind his back as well as don and doff clothes more comfortably.    Baseline L shoulder functional IR: L thumb to T11 spinous process (08/26/2022)    Time 8    Period Weeks    Status New    Target Date 10/24/22      PT LONG TERM GOAL #3   Title Pt will improve L shoulder flexion and abduction AROM by at least 20 degrees to promote ability to reach more comfortably.    Baseline L shoulder AROM 118 degrees flexion, 120 degrees abduction (08/26/2022)    Time 8    Period Weeks    Status New    Target Date 10/24/22      PT LONG TERM GOAL #4   Title Pt will improve L shoulder ER and IR strength by at least 1/2 MMT strength to promote ability to raise his arm with less pain.    Baseline ER 4/5, IR 4/5 (08/26/2022)    Time 8    Period Weeks    Status New    Target Date 10/24/22              Plan - 09/01/22 0856     Clinical Impression Statement Worked on improving L shoulder humeral head posterior and inferior glide via muscle activation. Good intraarticular movement palpated in L shoulder during exercises. L shoulder feels better, more loose per pt after session. Pt will benefit from continued skilled physical therapy services to decrease pain and stiffness, improve strength and function.    Personal Factors and Comorbidities Comorbidity 3+;Age;Past/Current Experience;Time since onset of injury/illness/exacerbation;Fitness    Comorbidities A-fib, prostate CA, arthritis, PE, TIA    Examination-Activity Limitations Bathing;Reach Overhead;Dressing;Hygiene/Grooming;Toileting;Caring for Others;Lift;Carry    Stability/Clinical Decision Making Stable/Uncomplicated    Rehab Potential Fair    PT Frequency 2x / week    PT Duration 8 weeks    PT Treatment/Interventions Therapeutic activities;Therapeutic exercise;Neuromuscular re-education;Manual  techniques;Biofeedback;Electrical Stimulation;Iontophoresis 4mg /ml Dexamethasone;Patient/family education;Dry needling    PT Next Visit Plan posture, scapular, ER, IR strengthening, thoracic extension, glenohumeral mechanics, manual techniques, modalities PRN    Consulted and Agree with Plan of Care Patient              Loralyn Freshwater PT, DPT  09/01/2022, 12:34 PM

## 2022-09-02 ENCOUNTER — Encounter: Payer: Medicare PPO | Admitting: Physical Therapy

## 2022-09-04 ENCOUNTER — Ambulatory Visit: Payer: Medicare Other

## 2022-09-05 ENCOUNTER — Ambulatory Visit: Payer: Medicare PPO

## 2022-09-05 DIAGNOSIS — G8929 Other chronic pain: Secondary | ICD-10-CM

## 2022-09-05 DIAGNOSIS — M25512 Pain in left shoulder: Secondary | ICD-10-CM | POA: Diagnosis not present

## 2022-09-05 DIAGNOSIS — M25612 Stiffness of left shoulder, not elsewhere classified: Secondary | ICD-10-CM

## 2022-09-05 NOTE — Therapy (Signed)
OUTPATIENT PHYSICAL THERAPY TREATMENT NOTE   Patient Name: Nicholas [name] MRN: [id number] DOB:1945-04-10, 77 y.o., male Today's Date: 09/05/2022  PCP: Danella Penton, MD  REFERRING PROVIDER: Christena Flake, MD   END OF SESSION:  PT End of Session - 09/05/22 1031     Visit Number 3    Number of Visits 17    Date for PT Re-Evaluation 10/24/22    PT Start Time 1032    PT Stop Time 1115    PT Time Calculation (min) 43 min    Activity Tolerance Patient tolerated treatment well    Behavior During Therapy [facility] for tasks assessed/performed              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   Dysrhythmia    Pulmonary embolism (HCC)    Skin cancer    TIA (transient ischemic attack)    Past Surgical History:  Procedure Laterality Date   AMPUTATION TOE Right 06/28/2022   Procedure: RIGHT SECOND TOE AMPUTATION;  Surgeon: Rosetta Posner, DPM;  Location: [facility] OR[facility];  Service: Podiatry;  Laterality: Right;   APPENDECTOMY     COLONOSCOPY WITH PROPOFOL N/A 11/23/2019   Procedure: COLONOSCOPY WITH PROPOFOL;  Surgeon: Toledo, Boykin Nearing, MD;  Location: [facility] END[facility];  Service: Gastroenterology;  Laterality: N/A;   cyber knife surgery     PROSTATE SURGERY     Patient Active Problem List   Diagnosis Date Noted   Hypokalemia 06/30/2022   Paroxysmal atrial fibrillation (HCC) 06/27/2022   Gout 06/27/2022   Peripheral neuropathy 06/27/2022   History of TIA (transient ischemic attack) 06/27/2022   Cellulitis of right leg 06/27/2022   Cellulitis of toe of right foot 06/27/2022   Sepsis due to cellulitis (HCC) 06/26/2022   COVID-19 virus infection 02/17/2021   A-fib (HCC)    TIA (transient ischemic attack)    Lactic acidosis    Hyponatremia    Hypotension    Generalized weakness     REFERRING DIAG:   M19.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 10 years ago, pt went down a shoot in a museum with his grandchildren and had to hold onto a round tube and had to maintain a sustained pull up which strained his L shoulder. The shoulder got better but got worse after the last year or so. Was doing PT at the hospital but was discharged secondary to progress. MRI showed arthritis in L shoulder. Doctor said for pt to keep using his L shoulder until he cannot tolerate it anymore and might get shots and possibly surgery. 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 Feels ok the day of doing exercises but shoulder bothers him the next day. Pain has gradually worsened for the past year. Pt is L hand   PRECAUTIONS: Fall risk   SUBJECTIVE:   SUBJECTIVE STATEMENT: had hammer toe surgery R foot after last session. L shoulder is alright. No extra pain the next day. No L shoulder pain currently.   PAIN:  Are you having pain? See subjective   TODAY'S TREATMENT:                                                                                                                                         DATE: 09/05/2022  Towel IR stretch bothers his shoulder a lot.      Therapeutic exercise   Seated L shoulder self inferior capsule stretch 10x3 with 5 second holds  With A to P pressure to L humeral head   Seated L shoulder IR isometrics, hand on abdomen 10x5 sec for 3 sets    L thumb to around T9 spinous process afterwards.    Seated L shoulder ER yellow band 10x2  Seated L triceps extension isometrics on table 10x3 with 5 second holds   Good posterior  translation of L humeral head palpated.   Seated B scapular retraction 10x3 with 5 second holds  Seated L shoulder flexion AROM after session: 126 degrees after session.      Improved exercise technique, movement at target joints, use of target muscles after mod verbal, visual, tactile cues.      Response to treatment Pt tolerated session well without aggravation of symptoms.      Clinical impression  Continue working on improving L shoulder humeral head posterior and inferior glide via muscle activation and stretch. Good intraarticular movement palpated in L shoulder during exercises. Pt tolerated session well without aggravation of symptoms.  Pt will benefit from continued skilled physical therapy services to decrease pain and stiffness, improve strength and function.       PATIENT EDUCATION: Education details: there-ex, HEP Person educated: Patient Education method: Explanation, Demonstration, Tactile cues, Verbal cues, and Handouts Education comprehension: verbalized understanding and returned demonstration  HOME EXERCISE PROGRAM:  Access Code: [email or web address] URL: [email or web address] Date: 09/01/2022 Prepared by: Loralyn Freshwater  Exercises - Seated Shoulder Inferior Glide  - 3 x daily - 7 x weekly - 3 sets - 10 reps - 5 seconds hold - Isometric Tricep Extension   - 3 x daily - 7 x weekly - 3 sets - 10 reps - 5 seconds hold - Seated Scapular Retraction  - 3 x daily - 7 x weekly - 3 sets - 10 reps - 5 seconds hold     PT Short Term Goals - 08/26/22 1619       PT SHORT TERM GOAL #1   Title Pt will be independent with his initial HEP to decrease pain, improve ROM, strength and ability to reach more comfortably.    Time 3    Period Weeks    Status New    Target Date 09/19/22              PT Long Term Goals - 08/26/22 1620       PT LONG TERM GOAL #1   Title Pt will have a decrease in L shoulder pain  to 4/10 or less at worst to promote ability to  reach up, to the side, and behind him more comfortably.    Baseline 7/10 L shoulder pain at worst for the past 3 months (08/26/2022)    Time 8    Period Weeks    Status New    Target Date 10/24/22      PT LONG TERM GOAL #2   Title Pt will improve L shoulder functional IR to L thumb to T8 spinous process to promote ability to reach behind his back as well as don and doff clothes more comfortably.    Baseline L shoulder functional IR: L thumb to T11 spinous process (08/26/2022)    Time 8    Period Weeks    Status New    Target Date 10/24/22      PT LONG TERM GOAL #3   Title Pt will improve L shoulder flexion and abduction AROM by at least 20 degrees to promote ability to reach more comfortably.    Baseline L shoulder AROM 118 degrees flexion, 120 degrees abduction (08/26/2022)    Time 8    Period Weeks    Status New    Target Date 10/24/22      PT LONG TERM GOAL #4   Title Pt will improve L shoulder ER and IR strength by at least 1/2 MMT strength to promote ability to raise his arm with less pain.    Baseline ER 4/5, IR 4/5 (08/26/2022)    Time 8    Period Weeks    Status New    Target Date 10/24/22              Plan - 09/05/22 1028     Clinical Impression Statement Continue working on improving L shoulder humeral head posterior and inferior glide via muscle activation and stretch. Good intraarticular movement palpated in L shoulder during exercises. Pt tolerated session well without aggravation of symptoms.  Pt will benefit from continued skilled physical therapy services to decrease pain and stiffness, improve strength and function.    Personal Factors and Comorbidities Comorbidity 3+;Age;Past/Current Experience;Time since onset of injury/illness/exacerbation;Fitness    Comorbidities A-fib, prostate CA, arthritis, PE, TIA    Examination-Activity Limitations Bathing;Reach Overhead;Dressing;Hygiene/Grooming;Toileting;Caring for Others;Lift;Carry    Stability/Clinical Decision  Making Stable/Uncomplicated    Rehab Potential Fair    PT Frequency 2x / week    PT Duration 8 weeks    PT Treatment/Interventions Therapeutic activities;Therapeutic exercise;Neuromuscular re-education;Manual techniques;Biofeedback;Electrical Stimulation;Iontophoresis 4mg /ml Dexamethasone;Patient/family education;Dry needling    PT Next Visit Plan posture, scapular, ER, IR strengthening, thoracic extension, glenohumeral mechanics, manual techniques, modalities PRN    Consulted and Agree with Plan of Care Patient              Loralyn Freshwater PT, DPT  09/05/2022, 11:21 AM

## 2022-09-08 ENCOUNTER — Ambulatory Visit: Payer: Medicare PPO | Attending: Surgery

## 2022-09-08 DIAGNOSIS — M25612 Stiffness of left shoulder, not elsewhere classified: Secondary | ICD-10-CM | POA: Insufficient documentation

## 2022-09-08 DIAGNOSIS — M25512 Pain in left shoulder: Secondary | ICD-10-CM | POA: Diagnosis present

## 2022-09-08 DIAGNOSIS — G8929 Other chronic pain: Secondary | ICD-10-CM | POA: Diagnosis present

## 2022-09-08 NOTE — Therapy (Signed)
OUTPATIENT PHYSICAL THERAPY TREATMENT NOTE   Patient Name: Nicholas [name] MRN: [id number] DOB:1945-04-23, 77 y.o., male Today's Date: 09/08/2022  PCP: Danella Penton, MD  REFERRING PROVIDER: Christena Flake, MD   END OF SESSION:  PT End of Session - 09/08/22 1430     Visit Number 4    Number of Visits 17    Date for PT Re-Evaluation 10/24/22    PT Start Time 1430    PT Stop Time 1510    PT Time Calculation (min) 40 min    Activity Tolerance Patient tolerated treatment well    Behavior During Therapy [facility] for tasks assessed/performed               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   Dysrhythmia    Pulmonary embolism (HCC)    Skin cancer    TIA (transient ischemic attack)    Past Surgical History:  Procedure Laterality Date   AMPUTATION TOE Right 06/28/2022   Procedure: RIGHT SECOND TOE AMPUTATION;  Surgeon: Rosetta Posner, DPM;  Location: [facility] ORS;  Service: Podiatry;  Laterality: Right;   APPENDECTOMY     COLONOSCOPY WITH PROPOFOL N/A 11/23/2019   Procedure: COLONOSCOPY WITH PROPOFOL;  Surgeon: Toledo, Boykin Nearing, MD;  Location: [facility];  Service: Gastroenterology;  Laterality: N/A;   cyber knife surgery     PROSTATE SURGERY     Patient Active Problem List   Diagnosis Date Noted   Hypokalemia 06/30/2022   Paroxysmal atrial fibrillation (HCC) 06/27/2022   Gout 06/27/2022   Peripheral neuropathy 06/27/2022   History of TIA (transient ischemic attack) 06/27/2022   Cellulitis of right leg 06/27/2022   Cellulitis of toe of right foot 06/27/2022   Sepsis due to cellulitis (HCC) 06/26/2022   COVID-19 virus infection 02/17/2021   A-fib (HCC)    TIA (transient ischemic attack)    Lactic acidosis    Hyponatremia    Hypotension    Generalized weakness     REFERRING DIAG:   M19.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 10 years ago, pt went down a shoot in a museum with his grandchildren and had to hold onto a round tube and had to maintain a sustained pull up which strained his L shoulder. The shoulder got better but got worse after the last year or so. Was doing PT at the hospital but was discharged secondary to progress. MRI showed arthritis in L shoulder. Doctor said for pt to keep using his L shoulder until he cannot tolerate it anymore and might get shots and possibly surgery. 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 Feels ok the day of doing exercises but shoulder bothers him the next day. Pain has gradually worsened for the past year. Pt is L hand   PRECAUTIONS: Fall risk   SUBJECTIVE:   SUBJECTIVE STATEMENT: Might have to leave 5-7 minutes early for another appointment. L shoulder. R foot pain makes sleeping difficult. 0/10 L shoulder     PAIN:  Are you having pain? See subjective   TODAY'S TREATMENT:                                                                                                                                         DATE: 09/08/2022  Towel IR stretch bothers his shoulder a lot.      Therapeutic exercise   L shoulder AROM at start of session  Flexion 115 degrees  Abduction 123 degrees  Seated L shoulder self inferior capsule stretch 10x3 with 5 second holds  Pulleys AAROM sitting    Flexion 10x3 with 5 second holds   140 degrees flexion AROM afterwards   Abduction 10x3 with 5 second holds   144 degrees abduction AROM afterwards.      With A to P pressure to L  humeral head    Seated L shoulder ER yellow band 10x2    Seated L shoulder IR isometrics, hand on abdomen 10x5 sec for 2 sets         Improved exercise technique, movement at target joints, use of target muscles after mod verbal, visual, tactile cues.      Response to treatment Pt tolerated session well without aggravation of symptoms.      Clinical impression  Improved L shoulder flexion and abduction AROM after performing AAROM using pulleys to decrease stiffness. Continued working on ER and IR strengthening with A to P pressure to humeral head to promote better arthrokinematics in his shoulder joint. Pt tolerated session well without aggravation of symptoms.  Pt will benefit from continued skilled physical therapy services to decrease pain and stiffness, improve strength and function.       PATIENT EDUCATION: Education details: there-ex, HEP Person educated: Patient Education method: Explanation, Demonstration, Tactile cues, Verbal cues, and Handouts Education comprehension: verbalized understanding and returned demonstration  HOME EXERCISE PROGRAM:  Access Code: [email or web address] URL: [email or web address] Date: 09/01/2022 Prepared by: Loralyn Freshwater  Exercises - Seated Shoulder Inferior Glide  - 3 x daily - 7 x weekly - 3 sets - 10 reps - 5 seconds hold - Isometric Tricep Extension   - 3 x daily - 7 x weekly - 3 sets - 10 reps - 5 seconds hold - Seated Scapular Retraction  - 3 x daily - 7 x weekly - 3 sets - 10 reps - 5 seconds hold  - Seated Shoulder Flexion AAROM with Pulley Behind  - 1 x daily - 7 x weekly - 3 sets - 10 reps - 5 seconds hold  - Seated Shoulder Abduction AAROM with Pulley Behind  - 1 x daily - 7 x weekly - 3 sets - 10 reps - 5 seconds hold      PT Short Term Goals - 08/26/22 1619       PT SHORT TERM GOAL #1   Title Pt will be independent with his initial HEP to decrease pain, improve ROM,  strength and ability to reach more comfortably.     Time 3    Period Weeks    Status New    Target Date 09/19/22              PT Long Term Goals - 08/26/22 1620       PT LONG TERM GOAL #1   Title Pt will have a decrease in L shoulder pain to 4/10 or less at worst to promote ability to reach up, to the side, and behind him more comfortably.    Baseline 7/10 L shoulder pain at worst for the past 3 months (08/26/2022)    Time 8    Period Weeks    Status New    Target Date 10/24/22      PT LONG TERM GOAL #2   Title Pt will improve L shoulder functional IR to L thumb to T8 spinous process to promote ability to reach behind his back as well as don and doff clothes more comfortably.    Baseline L shoulder functional IR: L thumb to T11 spinous process (08/26/2022)    Time 8    Period Weeks    Status New    Target Date 10/24/22      PT LONG TERM GOAL #3   Title Pt will improve L shoulder flexion and abduction AROM by at least 20 degrees to promote ability to reach more comfortably.    Baseline L shoulder AROM 118 degrees flexion, 120 degrees abduction (08/26/2022)    Time 8    Period Weeks    Status New    Target Date 10/24/22      PT LONG TERM GOAL #4   Title Pt will improve L shoulder ER and IR strength by at least 1/2 MMT strength to promote ability to raise his arm with less pain.    Baseline ER 4/5, IR 4/5 (08/26/2022)    Time 8    Period Weeks    Status New    Target Date 10/24/22              Plan - 09/08/22 1424     Clinical Impression Statement Improved L shoulder flexion and abduction AROM after performing AAROM using pulleys to decrease stiffness. Continued working on ER and IR strengthening with A to P pressure to humeral head to promote better arthrokinematics in his shoulder joint. Pt tolerated session well without aggravation of symptoms.  Pt will benefit from continued skilled physical therapy services to decrease pain and stiffness, improve strength and function.    Personal Factors and Comorbidities  Comorbidity 3+;Age;Past/Current Experience;Time since onset of injury/illness/exacerbation;Fitness    Comorbidities A-fib, prostate CA, arthritis, PE, TIA    Examination-Activity Limitations Bathing;Reach Overhead;Dressing;Hygiene/Grooming;Toileting;Caring for Others;Lift;Carry    Stability/Clinical Decision Making Stable/Uncomplicated    Rehab Potential Fair    PT Frequency 2x / week    PT Duration 8 weeks    PT Treatment/Interventions Therapeutic activities;Therapeutic exercise;Neuromuscular re-education;Manual techniques;Biofeedback;Electrical Stimulation;Iontophoresis 4mg /ml Dexamethasone;Patient/family education;Dry needling    PT Next Visit Plan posture, scapular, ER, IR strengthening, thoracic extension, glenohumeral mechanics, manual techniques, modalities PRN    Consulted and Agree with Plan of Care Patient              Loralyn Freshwater PT, DPT  09/08/2022, 4:11 PM

## 2022-09-15 ENCOUNTER — Ambulatory Visit: Payer: Medicare PPO

## 2022-09-15 DIAGNOSIS — G8929 Other chronic pain: Secondary | ICD-10-CM

## 2022-09-15 DIAGNOSIS — M25612 Stiffness of left shoulder, not elsewhere classified: Secondary | ICD-10-CM

## 2022-09-15 DIAGNOSIS — M25512 Pain in left shoulder: Secondary | ICD-10-CM | POA: Diagnosis not present

## 2022-09-15 NOTE — Therapy (Signed)
OUTPATIENT PHYSICAL THERAPY TREATMENT NOTE   Patient Name: Nicholas [name] MRN: [id number] DOB:October 15, 1945, 77 y.o., male Today's Date: 09/15/2022  PCP: Danella Penton, MD  REFERRING PROVIDER: Christena Flake, MD   END OF SESSION:  PT End of Session - 09/15/22 0820     Visit Number 5    Number of Visits 17    Date for PT Re-Evaluation 10/24/22    PT Start Time 0820    PT Stop Time 0901    PT Time Calculation (min) 41 min    Activity Tolerance Patient tolerated treatment well    Behavior During Therapy [facility] for tasks assessed/performed                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   Dysrhythmia    Pulmonary embolism (HCC)    Skin cancer    TIA (transient ischemic attack)    Past Surgical History:  Procedure Laterality Date   AMPUTATION TOE Right 06/28/2022   Procedure: RIGHT SECOND TOE AMPUTATION;  Surgeon: Rosetta Posner, DPM;  Location: [facility] OR[facility];  Service: Podiatry;  Laterality: Right;   APPENDECTOMY     COLONOSCOPY WITH PROPOFOL N/A 11/23/2019   Procedure: COLONOSCOPY WITH PROPOFOL;  Surgeon: Toledo, Boykin Nearing, MD;  Location: [facility] ENDOS[facility];  Service: Gastroenterology;  Laterality: N/A;   cyber knife surgery     PROSTATE SURGERY     Patient Active Problem List   Diagnosis Date Noted   Hypokalemia 06/30/2022   Paroxysmal atrial fibrillation (HCC) 06/27/2022   Gout 06/27/2022   Peripheral neuropathy 06/27/2022   History of TIA (transient ischemic attack) 06/27/2022   Cellulitis of right leg 06/27/2022   Cellulitis of toe of right foot 06/27/2022   Sepsis due to cellulitis (HCC) 06/26/2022   COVID-19 virus infection 02/17/2021   A-fib (HCC)    TIA (transient ischemic attack)    Lactic acidosis    Hyponatremia    Hypotension    Generalized weakness     REFERRING DIAG:   M19.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 10 years ago, pt went down a shoot in a museum with his grandchildren and had to hold onto a round tube and had to maintain a sustained pull up which strained his L shoulder. The shoulder got better but got worse after the last year or so. Was doing PT at the hospital but was discharged secondary to progress. MRI showed arthritis in L shoulder. Doctor said for pt to keep using his L shoulder until he cannot tolerate it anymore and might get shots and possibly surgery. 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 Feels ok the day of doing exercises but shoulder bothers him the next day. Pain has gradually worsened for the past year. Pt is L hand   PRECAUTIONS: Fall risk   SUBJECTIVE:   SUBJECTIVE STATEMENT: No L shoulder pain currently. Has been doing his pulley exercises, hurts the evening but better the next day. Feels better than what it was.  PAIN:  Are you having pain? See subjective   TODAY'S TREATMENT:                                                                                                                                         DATE: 09/15/2022  Towel IR stretch bothers his shoulder a lot.     Manual therapy  Reclined   L shoulder in abduction (about 90 degrees)    Posterior glide to humeral head grad 3- to decrease stiffness    Inferior glide to L humeral head grade 3- to 3 to decrease stiffness   L shoulder in flexion   STM to L teres major muscle to decrease tension    Therapeutic exercise   Reclined   With L arm in about 100 degrees flexion   Manually resisted  L triceps isometric in slight elbow flexion 10x3 with 5 second holds    Manually resisted L shoulder ER isometrics 10x3 with 5 second holds    Limited L shoulder ER PROM palpated, demonstrates 4-/5 strength at available range    L shoulder ER AAROM with PVC rod 10x 5 seconds with L arm propped into slight scaption     Improved exercise technique, movement at target joints, use of target muscles after mod verbal, visual, tactile cues.      Response to treatment Pt tolerated session well without aggravation of symptoms.      Clinical impression  Worked on manual therapy to improve posterior and inferior L glenohumeral joint mobility as well as decreasing L teres major muscle tension to improve L shoulder flexion and abduction AROM. Also worked on ER ROM and strengthening to promote better intraarticular movement and decrease stress to affected areas. Pt tolerated session well without aggravation of symptoms.  Pt will benefit from continued skilled physical therapy services to decrease pain and stiffness, improve strength and function.       PATIENT EDUCATION: Education details: there-ex, HEP Person educated: Patient Education method: Explanation, Demonstration, Tactile cues, Verbal cues, and Handouts Education comprehension: verbalized understanding and returned demonstration  HOME EXERCISE PROGRAM:  Access Code: [email or web address] URL: [email or web address] Date: 09/01/2022 Prepared by: Loralyn Freshwater  Exercises - Seated Shoulder Inferior Glide  - 3 x daily - 7 x weekly - 3 sets - 10 reps - 5 seconds hold - Isometric Tricep Extension   - 3 x daily - 7 x weekly - 3 sets - 10 reps - 5 seconds hold - Seated Scapular Retraction  - 3 x daily - 7 x weekly - 3 sets - 10 reps - 5 seconds hold  - Seated Shoulder Flexion AAROM with Pulley Behind  - 1 x daily - 7 x weekly - 3 sets - 10 reps - 5 seconds hold  - Seated Shoulder Abduction AAROM with Pulley Behind  - 1 x daily - 7 x  weekly - 3 sets - 10 reps -  5 seconds hold  - Supine Shoulder External Rotation in 45 Degrees Abduction AAROM with Dowel  - 2 x daily - 7 x weekly - 3 sets - 10 reps - 5 seconds hold    PT Short Term Goals - 08/26/22 1619       PT SHORT TERM GOAL #1   Title Pt will be independent with his initial HEP to decrease pain, improve ROM, strength and ability to reach more comfortably.    Time 3    Period Weeks    Status New    Target Date 09/19/22              PT Long Term Goals - 08/26/22 1620       PT LONG TERM GOAL #1   Title Pt will have a decrease in L shoulder pain to 4/10 or less at worst to promote ability to reach up, to the side, and behind him more comfortably.    Baseline 7/10 L shoulder pain at worst for the past 3 months (08/26/2022)    Time 8    Period Weeks    Status New    Target Date 10/24/22      PT LONG TERM GOAL #2   Title Pt will improve L shoulder functional IR to L thumb to T8 spinous process to promote ability to reach behind his back as well as don and doff clothes more comfortably.    Baseline L shoulder functional IR: L thumb to T11 spinous process (08/26/2022)    Time 8    Period Weeks    Status New    Target Date 10/24/22      PT LONG TERM GOAL #3   Title Pt will improve L shoulder flexion and abduction AROM by at least 20 degrees to promote ability to reach more comfortably.    Baseline L shoulder AROM 118 degrees flexion, 120 degrees abduction (08/26/2022)    Time 8    Period Weeks    Status New    Target Date 10/24/22      PT LONG TERM GOAL #4   Title Pt will improve L shoulder ER and IR strength by at least 1/2 MMT strength to promote ability to raise his arm with less pain.    Baseline ER 4/5, IR 4/5 (08/26/2022)    Time 8    Period Weeks    Status New    Target Date 10/24/22              Plan - 09/15/22 0820     Clinical Impression Statement Worked on manual therapy to improve posterior and inferior L glenohumeral joint mobility  as well as decreasing L teres major muscle tension to improve L shoulder flexion and abduction AROM. Also worked on ER ROM and strengthening to promote better intraarticular movement and decrease stress to affected areas. Pt tolerated session well without aggravation of symptoms.  Pt will benefit from continued skilled physical therapy services to decrease pain and stiffness, improve strength and function.    Personal Factors and Comorbidities Comorbidity 3+;Age;Past/Current Experience;Time since onset of injury/illness/exacerbation;Fitness    Comorbidities A-fib, prostate CA, arthritis, PE, TIA    Examination-Activity Limitations Bathing;Reach Overhead;Dressing;Hygiene/Grooming;Toileting;Caring for Others;Lift;Carry    Stability/Clinical Decision Making Stable/Uncomplicated    Rehab Potential Fair    PT Frequency 2x / week    PT Duration 8 weeks    PT Treatment/Interventions Therapeutic activities;Therapeutic exercise;Neuromuscular re-education;Manual techniques;Biofeedback;Electrical Stimulation;Iontophoresis 4mg /ml Dexamethasone;Patient/family education;Dry needling    PT Next Visit Plan  posture, scapular, ER, IR strengthening, thoracic extension, glenohumeral mechanics, manual techniques, modalities PRN    Consulted and Agree with Plan of Care Patient               Loralyn Freshwater PT, DPT  09/15/2022, 9:14 AM

## 2022-09-17 ENCOUNTER — Ambulatory Visit: Payer: Medicare PPO

## 2022-09-18 ENCOUNTER — Ambulatory Visit: Payer: Medicare PPO

## 2022-09-18 ENCOUNTER — Ambulatory Visit: Payer: Medicare Other

## 2022-09-18 DIAGNOSIS — M25512 Pain in left shoulder: Secondary | ICD-10-CM | POA: Diagnosis not present

## 2022-09-18 DIAGNOSIS — G8929 Other chronic pain: Secondary | ICD-10-CM

## 2022-09-18 DIAGNOSIS — M25612 Stiffness of left shoulder, not elsewhere classified: Secondary | ICD-10-CM

## 2022-09-18 NOTE — Therapy (Signed)
OUTPATIENT PHYSICAL THERAPY TREATMENT NOTE   Patient Name: Nicholas [name] MRN: [id number] DOB:10-21-1945, 77 y.o., male Today's Date: 09/18/2022  PCP: Danella Penton, MD  REFERRING PROVIDER: Christena Flake, MD   END OF SESSION:  PT End of Session - 09/18/22 1034     Visit Number 6    Number of Visits 17    Date for PT Re-Evaluation 10/24/22    PT Start Time 1034    PT Stop Time 1114    PT Time Calculation (min) 40 min    Activity Tolerance Patient tolerated treatment well    Behavior During Therapy [facility] for tasks assessed/performed                 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   Dysrhythmia    Pulmonary embolism (HCC)    Skin cancer    TIA (transient ischemic attack)    Past Surgical History:  Procedure Laterality Date   AMPUTATION TOE Right 06/28/2022   Procedure: RIGHT SECOND TOE AMPUTATION;  Surgeon: Rosetta Posner, DPM;  Location: [facility] ORS;  Service: Podiatry;  Laterality: Right;   APPENDECTOMY     COLONOSCOPY WITH PROPOFOL N/A 11/23/2019   Procedure: COLONOSCOPY WITH PROPOFOL;  Surgeon: Toledo, Boykin Nearing, MD;  Location: [facility];  Service: Gastroenterology;  Laterality: N/A;   cyber knife surgery     PROSTATE SURGERY     Patient Active Problem List   Diagnosis Date Noted   Hypokalemia 06/30/2022   Paroxysmal atrial fibrillation (HCC) 06/27/2022   Gout 06/27/2022   Peripheral neuropathy 06/27/2022   History of TIA (transient ischemic attack) 06/27/2022   Cellulitis of right leg 06/27/2022   Cellulitis of toe of right foot 06/27/2022   Sepsis due to cellulitis (HCC) 06/26/2022   COVID-19 virus infection 02/17/2021   A-fib (HCC)    TIA (transient ischemic attack)    Lactic acidosis    Hyponatremia    Hypotension    Generalized weakness     REFERRING DIAG:   M19.012 (ICD-10-CM) - Primary osteoarthritis, left shoulder  M75.82 (ICD-10-CM) -  Other shoulder lesions, left shoulder  M75.112 (ICD-10-CM) - Incomplete rotator cuff tear or rupture of left shoulder, not specified as traumatic  M75.22 (ICD-10-CM) - Bicipital tendinitis, left shoulder     THERAPY DIAG:  Chronic left shoulder pain  Stiffness of left shoulder, not elsewhere classified  Rationale for Evaluation and Treatment Rehabilitation  PERTINENT HISTORY: L shoulder pain. 10 years ago, pt went down a shoot in a museum with his grandchildren and had to hold onto a round tube and had to maintain a sustained pull up which strained his L shoulder. The shoulder got better but got worse after the last year or so. Was doing PT at the hospital but was discharged secondary to progress. MRI showed arthritis in L shoulder. Doctor said for pt to keep using his L shoulder until he cannot tolerate it anymore and might get shots and possibly surgery. 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 Feels ok the day of doing exercises but shoulder bothers him the next day. Pain has gradually worsened for the past year. Pt is L hand   PRECAUTIONS: Fall risk   SUBJECTIVE:   SUBJECTIVE STATEMENT: L shoulder is alright until he moves it. No pain when raising it up currently.      PAIN:  Are you having  pain? See subjective   TODAY'S TREATMENT:                                                                                                                                         DATE: 09/18/2022  Towel IR stretch bothers his shoulder a lot.    No latex allergies   Manual therapy   Reclined     L shoulder in flexion (about 90 degrees)   Inferior glide to glenohumeral joint grade 3- to 3 to improve mobility   L shoulder in flexion   STM to L teres major muscle to decrease tension      Therapeutic exercise    L shoulder AROM at start of session  Flexion 124 degrees flexion  Abduction 143 degrees abduction   Reclined    L shoulder ER AAROM with PVC rod 10x2 with 5  second holds with L arm propped into slight scaption   With L arm in about 100 degrees flexion   Manually resisted L triceps isometric in slight elbow flexion 10x3 with 5 second holds     Seated L shoulder ER yellow band 10x3     Improved exercise technique, movement at target joints, use of target muscles after mod verbal, visual, tactile cues.      Response to treatment Pt tolerated session well without aggravation of symptoms.      Clinical impression  Improved L shoulder abduction AROM since initial evaluation. Continued working on improving L glenohumeral joint mobility and posterior shoulder and ER strengthening to promote better intraarticular movement. Pt tolerated session well without aggravation of symptoms.  Pt will benefit from continued skilled physical therapy services to decrease pain and stiffness, improve strength and function.       PATIENT EDUCATION: Education details: there-ex, HEP Person educated: Patient Education method: Explanation, Demonstration, Tactile cues, Verbal cues, and Handouts Education comprehension: verbalized understanding and returned demonstration  HOME EXERCISE PROGRAM:  Access Code: [email or web address] URL: [email or web address] Date: 09/01/2022 Prepared by: Loralyn Freshwater  Exercises - Seated Shoulder Inferior Glide  - 3 x daily - 7 x weekly - 3 sets - 10 reps - 5 seconds hold - Isometric Tricep Extension   - 3 x daily - 7 x weekly - 3 sets - 10 reps - 5 seconds hold - Seated Scapular Retraction  - 3 x daily - 7 x weekly - 3 sets - 10 reps - 5 seconds hold  - Seated Shoulder Flexion AAROM with Pulley Behind  - 1 x daily - 7 x weekly - 3 sets - 10 reps - 5 seconds hold  - Seated Shoulder Abduction AAROM with Pulley Behind  - 1 x daily - 7 x weekly - 3 sets - 10 reps - 5 seconds hold  - Supine Shoulder External Rotation in 45 Degrees Abduction AAROM with Dowel  - 2 x daily - 7 x weekly - 3 sets -  10 reps - 5 seconds hold  -  Standing Shoulder External Rotation with Resistance  - 1 x daily - 7 x weekly - 3 sets - 10 reps   PT Short Term Goals - 08/26/22 1619       PT SHORT TERM GOAL #1   Title Pt will be independent with his initial HEP to decrease pain, improve ROM, strength and ability to reach more comfortably.    Time 3    Period Weeks    Status New    Target Date 09/19/22              PT Long Term Goals - 08/26/22 1620       PT LONG TERM GOAL #1   Title Pt will have a decrease in L shoulder pain to 4/10 or less at worst to promote ability to reach up, to the side, and behind him more comfortably.    Baseline 7/10 L shoulder pain at worst for the past 3 months (08/26/2022)    Time 8    Period Weeks    Status New    Target Date 10/24/22      PT LONG TERM GOAL #2   Title Pt will improve L shoulder functional IR to L thumb to T8 spinous process to promote ability to reach behind his back as well as don and doff clothes more comfortably.    Baseline L shoulder functional IR: L thumb to T11 spinous process (08/26/2022)    Time 8    Period Weeks    Status New    Target Date 10/24/22      PT LONG TERM GOAL #3   Title Pt will improve L shoulder flexion and abduction AROM by at least 20 degrees to promote ability to reach more comfortably.    Baseline L shoulder AROM 118 degrees flexion, 120 degrees abduction (08/26/2022)    Time 8    Period Weeks    Status New    Target Date 10/24/22      PT LONG TERM GOAL #4   Title Pt will improve L shoulder ER and IR strength by at least 1/2 MMT strength to promote ability to raise his arm with less pain.    Baseline ER 4/5, IR 4/5 (08/26/2022)    Time 8    Period Weeks    Status New    Target Date 10/24/22              Plan - 09/18/22 1033     Clinical Impression Statement Improved L shoulder abduction AROM since initial evaluation. Continued working on improving L glenohumeral joint mobility and posterior shoulder and ER strengthening to promote  better intraarticular movement. Pt tolerated session well without aggravation of symptoms.  Pt will benefit from continued skilled physical therapy services to decrease pain and stiffness, improve strength and function.    Personal Factors and Comorbidities Comorbidity 3+;Age;Past/Current Experience;Time since onset of injury/illness/exacerbation;Fitness    Comorbidities A-fib, prostate CA, arthritis, PE, TIA    Examination-Activity Limitations Bathing;Reach Overhead;Dressing;Hygiene/Grooming;Toileting;Caring for Others;Lift;Carry    Stability/Clinical Decision Making Stable/Uncomplicated    Rehab Potential Fair    PT Frequency 2x / week    PT Duration 8 weeks    PT Treatment/Interventions Therapeutic activities;Therapeutic exercise;Neuromuscular re-education;Manual techniques;Biofeedback;Electrical Stimulation;Iontophoresis 4mg /ml Dexamethasone;Patient/family education;Dry needling    PT Next Visit Plan posture, scapular, ER, IR strengthening, thoracic extension, glenohumeral mechanics, manual techniques, modalities PRN    Consulted and Agree with Plan of Care Patient  [place] PT, DPT  09/18/2022, 2:15 PM

## 2022-09-22 ENCOUNTER — Ambulatory Visit: Payer: Medicare PPO

## 2022-09-22 DIAGNOSIS — G8929 Other chronic pain: Secondary | ICD-10-CM

## 2022-09-22 DIAGNOSIS — M25612 Stiffness of left shoulder, not elsewhere classified: Secondary | ICD-10-CM

## 2022-09-22 DIAGNOSIS — M25512 Pain in left shoulder: Secondary | ICD-10-CM | POA: Diagnosis not present

## 2022-09-22 NOTE — Therapy (Signed)
OUTPATIENT PHYSICAL THERAPY TREATMENT NOTE   Patient Name: Nicholas [name] MRN: [id number] DOB:04-Jan-1946, 77 y.o., male Today's Date: 09/22/2022  PCP: Danella Penton, MD  REFERRING PROVIDER: Christena Flake, MD   END OF SESSION:  PT End of Session - 09/22/22 1349     Visit Number 7    Number of Visits 17    Date for PT Re-Evaluation 10/24/22    PT Start Time 1349    PT Stop Time 1428    PT Time Calculation (min) 39 min    Activity Tolerance Patient tolerated treatment well    Behavior During Therapy [facility] for tasks assessed/performed                  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   Dysrhythmia    Pulmonary embolism (HCC)    Skin cancer    TIA (transient ischemic attack)    Past Surgical History:  Procedure Laterality Date   AMPUTATION TOE Right 06/28/2022   Procedure: RIGHT SECOND TOE AMPUTATION;  Surgeon: Rosetta Posner, DPM;  Location: [facility] ORS;  Service: Podiatry;  Laterality: Right;   APPENDECTOMY     COLONOSCOPY WITH PROPOFOL N/A 11/23/2019   Procedure: COLONOSCOPY WITH PROPOFOL;  Surgeon: Toledo, Boykin Nearing, MD;  Location: [facility];  Service: Gastroenterology;  Laterality: N/A;   cyber knife surgery     PROSTATE SURGERY     Patient Active Problem List   Diagnosis Date Noted   Hypokalemia 06/30/2022   Paroxysmal atrial fibrillation (HCC) 06/27/2022   Gout 06/27/2022   Peripheral neuropathy 06/27/2022   History of TIA (transient ischemic attack) 06/27/2022   Cellulitis of right leg 06/27/2022   Cellulitis of toe of right foot 06/27/2022   Sepsis due to cellulitis (HCC) 06/26/2022   COVID-19 virus infection 02/17/2021   A-fib (HCC)    TIA (transient ischemic attack)    Lactic acidosis    Hyponatremia    Hypotension    Generalized weakness     REFERRING DIAG:   M19.012 (ICD-10-CM) - Primary osteoarthritis, left shoulder  M75.82 (ICD-10-CM) -  Other shoulder lesions, left shoulder  M75.112 (ICD-10-CM) - Incomplete rotator cuff tear or rupture of left shoulder, not specified as traumatic  M75.22 (ICD-10-CM) - Bicipital tendinitis, left shoulder     THERAPY DIAG:  Chronic left shoulder pain  Stiffness of left shoulder, not elsewhere classified  Rationale for Evaluation and Treatment Rehabilitation  PERTINENT HISTORY: L shoulder pain. 10 years ago, pt went down a shoot in a museum with his grandchildren and had to hold onto a round tube and had to maintain a sustained pull up which strained his L shoulder. The shoulder got better but got worse after the last year or so. Was doing PT at the hospital but was discharged secondary to progress. MRI showed arthritis in L shoulder. Doctor said for pt to keep using his L shoulder until he cannot tolerate it anymore and might get shots and possibly surgery. 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 Feels ok the day of doing exercises but shoulder bothers him the next day. Pain has gradually worsened for the past year. Pt is L hand   PRECAUTIONS: Fall risk   SUBJECTIVE:   SUBJECTIVE STATEMENT: L shoulder is alright. No pain currently. 1.5/10 L shoulder pain at worst for the past 7 days.      PAIN:  Are you having pain? See subjective   TODAY'S TREATMENT:                                                                                                                                         DATE: 09/22/2022  Towel IR stretch bothers his shoulder a lot.    No latex allergies   Manual therapy  Reclined     L shoulder in flexion (about 90 degrees)   Inferior glide, and posterior inferior glide to humeral head at the glenohumeral joint grade 3- to 3 to improve mobility   L shoulder in flexion   STM to L teres major muscle to decrease tension      Therapeutic exercise    L shoulder AROM at start of session  Flexion 122 degrees flexion  Abduction 142 degrees abduction    Reclined    With L arm in about 100 degrees flexion   Manually resisted L triceps isometric in slight elbow flexion 10x3 with 5 second holds  Standing L shoulder functional IR  L thumb to T8 spinous process.   Seated L shoulder ER yellow band 10x3  Seated L shoulder extension with scapular retraction green band 10x5 seconds for 2 sets    Improved exercise technique, movement at target joints, use of target muscles after mod verbal, visual, tactile cues.      Response to treatment Pt tolerated session well without aggravation of symptoms.      Clinical impression  Decreased L shoulder pain at worst for the past 7 days based on subjective reports. Improved L shoulder functional IR. Continued working on improving L glenohumeral joint mobility and posterior shoulder and ER strengthening to promote better intraarticular movement. Pt tolerated session well without aggravation of symptoms.  Pt will benefit from continued skilled physical therapy services to decrease pain and stiffness, improve strength and function.       PATIENT EDUCATION: Education details: there-ex, HEP Person educated: Patient Education method: Explanation, Demonstration, Tactile cues, Verbal cues, and Handouts Education comprehension: verbalized understanding and returned demonstration  HOME EXERCISE PROGRAM:  Access Code: [email or web address] URL: [email or web address] Date: 09/01/2022 Prepared by: Loralyn Freshwater  Exercises - Seated Shoulder Inferior Glide  - 3 x daily - 7 x weekly - 3 sets - 10 reps - 5 seconds hold - Isometric Tricep Extension   - 3 x daily - 7 x weekly - 3 sets - 10 reps - 5 seconds hold - Seated Scapular Retraction  - 3 x daily - 7 x weekly - 3 sets - 10 reps - 5 seconds hold  - Seated Shoulder Flexion AAROM with Pulley Behind  - 1 x daily - 7 x weekly - 3 sets - 10 reps - 5 seconds hold  - Seated Shoulder Abduction AAROM with Pulley Behind  - 1 x daily - 7 x weekly - 3 sets -  10 reps - 5  seconds hold  - Supine Shoulder External Rotation in 45 Degrees Abduction AAROM with Dowel  - 2 x daily - 7 x weekly - 3 sets - 10 reps - 5 seconds hold  - Standing Shoulder External Rotation with Resistance  - 1 x daily - 7 x weekly - 3 sets - 10 reps   PT Short Term Goals - 08/26/22 1619       PT SHORT TERM GOAL #1   Title Pt will be independent with his initial HEP to decrease pain, improve ROM, strength and ability to reach more comfortably.    Time 3    Period Weeks    Status New    Target Date 09/19/22              PT Long Term Goals - 08/26/22 1620       PT LONG TERM GOAL #1   Title Pt will have a decrease in L shoulder pain to 4/10 or less at worst to promote ability to reach up, to the side, and behind him more comfortably.    Baseline 7/10 L shoulder pain at worst for the past 3 months (08/26/2022)    Time 8    Period Weeks    Status New    Target Date 10/24/22      PT LONG TERM GOAL #2   Title Pt will improve L shoulder functional IR to L thumb to T8 spinous process to promote ability to reach behind his back as well as don and doff clothes more comfortably.    Baseline L shoulder functional IR: L thumb to T11 spinous process (08/26/2022)    Time 8    Period Weeks    Status New    Target Date 10/24/22      PT LONG TERM GOAL #3   Title Pt will improve L shoulder flexion and abduction AROM by at least 20 degrees to promote ability to reach more comfortably.    Baseline L shoulder AROM 118 degrees flexion, 120 degrees abduction (08/26/2022)    Time 8    Period Weeks    Status New    Target Date 10/24/22      PT LONG TERM GOAL #4   Title Pt will improve L shoulder ER and IR strength by at least 1/2 MMT strength to promote ability to raise his arm with less pain.    Baseline ER 4/5, IR 4/5 (08/26/2022)    Time 8    Period Weeks    Status New    Target Date 10/24/22              Plan - 09/22/22 1349     Clinical Impression Statement  Decreased L shoulder pain at worst for the past 7 days based on subjective reports. Improved L shoulder functional IR. Continued working on improving L glenohumeral joint mobility and posterior shoulder and ER strengthening to promote better intraarticular movement. Pt tolerated session well without aggravation of symptoms.  Pt will benefit from continued skilled physical therapy services to decrease pain and stiffness, improve strength and function.    Personal Factors and Comorbidities Comorbidity 3+;Age;Past/Current Experience;Time since onset of injury/illness/exacerbation;Fitness    Comorbidities A-fib, prostate CA, arthritis, PE, TIA    Examination-Activity Limitations Bathing;Reach Overhead;Dressing;Hygiene/Grooming;Toileting;Caring for Others;Lift;Carry    Stability/Clinical Decision Making Stable/Uncomplicated    Rehab Potential Fair    PT Frequency 2x / week    PT Duration 8 weeks    PT Treatment/Interventions Therapeutic activities;Therapeutic exercise;Neuromuscular re-education;Manual techniques;Biofeedback;Lobbyist  Stimulation;Iontophoresis 4mg /ml Dexamethasone;Patient/family education;Dry needling    PT Next Visit Plan posture, scapular, ER, IR strengthening, thoracic extension, glenohumeral mechanics, manual techniques, modalities PRN    Consulted and Agree with Plan of Care Patient               Loralyn Freshwater PT, DPT  09/22/2022, 5:33 PM

## 2022-09-25 ENCOUNTER — Ambulatory Visit: Payer: Medicare Other

## 2022-09-26 ENCOUNTER — Ambulatory Visit: Payer: Medicare PPO

## 2022-09-26 DIAGNOSIS — G8929 Other chronic pain: Secondary | ICD-10-CM

## 2022-09-26 DIAGNOSIS — M25612 Stiffness of left shoulder, not elsewhere classified: Secondary | ICD-10-CM

## 2022-09-26 DIAGNOSIS — M25512 Pain in left shoulder: Secondary | ICD-10-CM | POA: Diagnosis not present

## 2022-09-26 NOTE — Therapy (Signed)
OUTPATIENT PHYSICAL THERAPY TREATMENT NOTE   Patient Name: Nicholas [name] MRN: [id number] DOB:Jan 21, 1946, 77 y.o., male Today's Date: 09/26/2022  PCP: Danella Penton, MD  REFERRING PROVIDER: Christena Flake, MD   END OF SESSION:  PT End of Session - 09/26/22 0904     Visit Number 8    Number of Visits 17    Date for PT Re-Evaluation 10/24/22    PT Start Time 0904    PT Stop Time 0950    PT Time Calculation (min) 46 min    Activity Tolerance Patient tolerated treatment well    Behavior During Therapy [facility] for tasks assessed/performed                   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   Dysrhythmia    Pulmonary embolism (HCC)    Skin cancer    TIA (transient ischemic attack)    Past Surgical History:  Procedure Laterality Date   AMPUTATION TOE Right 06/28/2022   Procedure: RIGHT SECOND TOE AMPUTATION;  Surgeon: Rosetta Posner, DPM;  Location: [facility] ORS;  Service: Podiatry;  Laterality: Right;   APPENDECTOMY     COLONOSCOPY WITH PROPOFOL N/A 11/23/2019   Procedure: COLONOSCOPY WITH PROPOFOL;  Surgeon: Toledo, Boykin Nearing, MD;  Location: [facility];  Service: Gastroenterology;  Laterality: N/A;   cyber knife surgery     PROSTATE SURGERY     Patient Active Problem List   Diagnosis Date Noted   Hypokalemia 06/30/2022   Paroxysmal atrial fibrillation (HCC) 06/27/2022   Gout 06/27/2022   Peripheral neuropathy 06/27/2022   History of TIA (transient ischemic attack) 06/27/2022   Cellulitis of right leg 06/27/2022   Cellulitis of toe of right foot 06/27/2022   Sepsis due to cellulitis (HCC) 06/26/2022   COVID-19 virus infection 02/17/2021   A-fib (HCC)    TIA (transient ischemic attack)    Lactic acidosis    Hyponatremia    Hypotension    Generalized weakness     REFERRING DIAG:   M19.012 (ICD-10-CM) - Primary osteoarthritis, left shoulder  M75.82 (ICD-10-CM)  - Other shoulder lesions, left shoulder  M75.112 (ICD-10-CM) - Incomplete rotator cuff tear or rupture of left shoulder, not specified as traumatic  M75.22 (ICD-10-CM) - Bicipital tendinitis, left shoulder     THERAPY DIAG:  Chronic left shoulder pain  Stiffness of left shoulder, not elsewhere classified  Rationale for Evaluation and Treatment Rehabilitation  PERTINENT HISTORY: L shoulder pain. 10 years ago, pt went down a shoot in a museum with his grandchildren and had to hold onto a round tube and had to maintain a sustained pull up which strained his L shoulder. The shoulder got better but got worse after the last year or so. Was doing PT at the hospital but was discharged secondary to progress. MRI showed arthritis in L shoulder. Doctor said for pt to keep using his L shoulder until he cannot tolerate it anymore and might get shots and possibly surgery. 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 Feels ok the day of doing exercises but shoulder bothers him the next day. Pain has gradually worsened for the past year. Pt is L hand   PRECAUTIONS: Fall risk   SUBJECTIVE:   SUBJECTIVE STATEMENT: L was aching this morning. Sleeps with a CPAP machine so he sleeps on his back. No L shoulder pain currently when raising  his arm up. Did not do his home exercises.      PAIN:  Are you having pain? See subjective   TODAY'S TREATMENT:                                                                                                                                         DATE: 09/26/2022  Towel IR stretch bothers his shoulder a lot.    No latex allergies   Manual therapy  Reclined     L shoulder in flexion (about 90 degrees)   Inferior glide, and posterior inferior glide to humeral head at the glenohumeral joint grade 3- to 3 to improve mobility   L shoulder in about 90 degrees abduction    Inferior glide L humeral head grade 3 to improve mobility and decrease stiffness.    L  radial nerve symptoms. Eases with rest   Seated STM L teres major and infraspinatus muscles to decrease tension          Therapeutic exercise    L shoulder AROM at start of session  Flexion 118 degrees flexion  Abduction 125 degrees abduction   Seated L shoulder extension with scapular retraction green band 10x5 seconds for 2 sets  Then blue band 10x5 seconds   L shoulder pulley   Flexion 10x5 seconds for 2 sets   Abduction 10x5 seconds    B hand tingling afterwards    Seated B scapular retraction to promote thoracic extension and scapular posterior tipping. 10x5 seconds  Seated chin tuck 10x5 seconds to promote upper thoracic extension and decrease stress to lower cervical spine.   Decreased B hand symptoms.        Improved exercise technique, movement at target joints, use of target muscles after mod verbal, visual, tactile cues.      Response to treatment Fair tolerance to today's session.     Clinical impression   Continued working on improving L shoulder joint mobility, thoracic extension, scapular strengthening to improve glenohumeral mechanics and decrease stiffness to decrease difficulty raising his L arm up. L UE paresthesia which eases with chin tucks to decrease stress to L UE nerves from his neck.  Pt will benefit from continued skilled physical therapy services to decrease pain and stiffness, improve strength and function.       PATIENT EDUCATION: Education details: there-ex, HEP Person educated: Patient Education method: Explanation, Demonstration, Tactile cues, Verbal cues, and Handouts Education comprehension: verbalized understanding and returned demonstration  HOME EXERCISE PROGRAM:  Access Code: [email or web address] URL: [email or web address] Date: 09/01/2022 Prepared by: Loralyn Freshwater  Exercises - Seated Shoulder Inferior Glide  - 3 x daily - 7 x weekly - 3 sets - 10 reps - 5 seconds hold - Isometric Tricep Extension   - 3 x daily  - 7 x weekly - 3 sets - 10 reps - 5 seconds hold - Seated Scapular Retraction  -  3 x daily - 7 x weekly - 3 sets - 10 reps - 5 seconds hold  - Seated Shoulder Flexion AAROM with Pulley Behind  - 1 x daily - 7 x weekly - 3 sets - 10 reps - 5 seconds hold  - Seated Shoulder Abduction AAROM with Pulley Behind  - 1 x daily - 7 x weekly - 3 sets - 10 reps - 5 seconds hold  - Supine Shoulder External Rotation in 45 Degrees Abduction AAROM with Dowel  - 2 x daily - 7 x weekly - 3 sets - 10 reps - 5 seconds hold  - Standing Shoulder External Rotation with Resistance  - 1 x daily - 7 x weekly - 3 sets - 10 reps   PT Short Term Goals - 08/26/22 1619       PT SHORT TERM GOAL #1   Title Pt will be independent with his initial HEP to decrease pain, improve ROM, strength and ability to reach more comfortably.    Time 3    Period Weeks    Status New    Target Date 09/19/22              PT Long Term Goals - 08/26/22 1620       PT LONG TERM GOAL #1   Title Pt will have a decrease in L shoulder pain to 4/10 or less at worst to promote ability to reach up, to the side, and behind him more comfortably.    Baseline 7/10 L shoulder pain at worst for the past 3 months (08/26/2022)    Time 8    Period Weeks    Status New    Target Date 10/24/22      PT LONG TERM GOAL #2   Title Pt will improve L shoulder functional IR to L thumb to T8 spinous process to promote ability to reach behind his back as well as don and doff clothes more comfortably.    Baseline L shoulder functional IR: L thumb to T11 spinous process (08/26/2022)    Time 8    Period Weeks    Status New    Target Date 10/24/22      PT LONG TERM GOAL #3   Title Pt will improve L shoulder flexion and abduction AROM by at least 20 degrees to promote ability to reach more comfortably.    Baseline L shoulder AROM 118 degrees flexion, 120 degrees abduction (08/26/2022)    Time 8    Period Weeks    Status New    Target Date 10/24/22       PT LONG TERM GOAL #4   Title Pt will improve L shoulder ER and IR strength by at least 1/2 MMT strength to promote ability to raise his arm with less pain.    Baseline ER 4/5, IR 4/5 (08/26/2022)    Time 8    Period Weeks    Status New    Target Date 10/24/22              Plan - 09/26/22 0903     Clinical Impression Statement Continued working on improving L shoulder joint mobility, thoracic extension, scapular strengthening to improve glenohumeral mechanics and decrease stiffness to decrease difficulty raising his L arm up. L UE paresthesia which eases with chin tucks to decrease stress to L UE nerves from his neck.  Pt will benefit from continued skilled physical therapy services to decrease pain and stiffness, improve strength and function.  Personal Factors and Comorbidities Comorbidity 3+;Age;Past/Current Experience;Time since onset of injury/illness/exacerbation;Fitness    Comorbidities A-fib, prostate CA, arthritis, PE, TIA    Examination-Activity Limitations Bathing;Reach Overhead;Dressing;Hygiene/Grooming;Toileting;Caring for Others;Lift;Carry    Stability/Clinical Decision Making Stable/Uncomplicated    Rehab Potential Fair    PT Frequency 2x / week    PT Duration 8 weeks    PT Treatment/Interventions Therapeutic activities;Therapeutic exercise;Neuromuscular re-education;Manual techniques;Biofeedback;Electrical Stimulation;Iontophoresis 4mg /ml Dexamethasone;Patient/family education;Dry needling    PT Next Visit Plan posture, scapular, ER, IR strengthening, thoracic extension, glenohumeral mechanics, manual techniques, modalities PRN    Consulted and Agree with Plan of Care Patient               Loralyn Freshwater PT, DPT  09/26/2022, 11:58 AM

## 2022-09-29 ENCOUNTER — Ambulatory Visit: Payer: Medicare PPO

## 2022-09-29 DIAGNOSIS — M25512 Pain in left shoulder: Secondary | ICD-10-CM | POA: Diagnosis not present

## 2022-09-29 DIAGNOSIS — M25612 Stiffness of left shoulder, not elsewhere classified: Secondary | ICD-10-CM

## 2022-09-29 DIAGNOSIS — G8929 Other chronic pain: Secondary | ICD-10-CM

## 2022-09-29 NOTE — Therapy (Signed)
OUTPATIENT PHYSICAL THERAPY TREATMENT NOTE   Patient Name: Nicholas [name] MRN: [id number] DOB:1945/04/12, 77 y.o., male Today's Date: 09/29/2022  PCP: Danella Penton, MD  REFERRING PROVIDER: Christena Flake, MD   END OF SESSION:  PT End of Session - 09/29/22 1035     Visit Number 9    Number of Visits 17    Date for PT Re-Evaluation 10/24/22    PT Start Time 1035    PT Stop Time 1114    PT Time Calculation (min) 39 min    Activity Tolerance Patient tolerated treatment well    Behavior During Therapy [facility] for tasks assessed/performed                    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   Dysrhythmia    Pulmonary embolism (HCC)    Skin cancer    TIA (transient ischemic attack)    Past Surgical History:  Procedure Laterality Date   AMPUTATION TOE Right 06/28/2022   Procedure: RIGHT SECOND TOE AMPUTATION;  Surgeon: Rosetta Posner, DPM;  Location: [facility] OR[facility];  Service: Podiatry;  Laterality: Right;   APPENDECTOMY     COLONOSCOPY WITH PROPOFOL N/A 11/23/2019   Procedure: COLONOSCOPY WITH PROPOFOL;  Surgeon: Toledo, Boykin Nearing, MD;  Location: [facility];  Service: Gastroenterology;  Laterality: N/A;   cyber knife surgery     PROSTATE SURGERY     Patient Active Problem List   Diagnosis Date Noted   Hypokalemia 06/30/2022   Paroxysmal atrial fibrillation (HCC) 06/27/2022   Gout 06/27/2022   Peripheral neuropathy 06/27/2022   History of TIA (transient ischemic attack) 06/27/2022   Cellulitis of right leg 06/27/2022   Cellulitis of toe of right foot 06/27/2022   Sepsis due to cellulitis (HCC) 06/26/2022   COVID-19 virus infection 02/17/2021   A-fib (HCC)    TIA (transient ischemic attack)    Lactic acidosis    Hyponatremia    Hypotension    Generalized weakness     REFERRING DIAG:   M19.012 (ICD-10-CM) - Primary osteoarthritis, left shoulder  M75.82  (ICD-10-CM) - Other shoulder lesions, left shoulder  M75.112 (ICD-10-CM) - Incomplete rotator cuff tear or rupture of left shoulder, not specified as traumatic  M75.22 (ICD-10-CM) - Bicipital tendinitis, left shoulder     THERAPY DIAG:  Chronic left shoulder pain  Stiffness of left shoulder, not elsewhere classified  Rationale for Evaluation and Treatment Rehabilitation  PERTINENT HISTORY: L shoulder pain. 10 years ago, pt went down a shoot in a museum with his grandchildren and had to hold onto a round tube and had to maintain a sustained pull up which strained his L shoulder. The shoulder got better but got worse after the last year or so. Was doing PT at the hospital but was discharged secondary to progress. MRI showed arthritis in L shoulder. Doctor said for pt to keep using his L shoulder until he cannot tolerate it anymore and might get shots and possibly surgery. 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 Feels ok the day of doing exercises but shoulder bothers him the next day. Pain has gradually worsened for the past year. Pt is L hand   PRECAUTIONS: Fall risk   SUBJECTIVE:    Last scheduled session: 10/10/2022   SUBJECTIVE STATEMENT: L shoulder was painful for a while after last session but got better. The ER  band exercise made is sore the next day.  Np pain currently.  1/10 L shoulder pain at worst for the past 7 days.      PAIN:  Are you having pain? See subjective   TODAY'S TREATMENT:                                                                                                                                         DATE: 09/29/2022  Towel IR stretch bothers his shoulder a lot.    No latex allergies       Therapeutic exercise   Seated B shoulder ER yellow band 10x3  Cues to perform properly (maintain elbow flexion at 90 degrees and not to perform abduction at the same time)  Seated chin tuck 10x5 seconds for 3 sets to promote upper thoracic extension  and decrease stress to lower cervical spine.     L shoulder AROM   Flexion 122 degrees flexion  Abduction 141 degrees abduction   Seated self L shoulder joint inferior glide at table 10x3 with 5 second holds  Seated L shoulder extension with scapular retraction blue band 10x5 seconds for 3 sets  Seated B scapular retraction to promote thoracic extension and scapular posterior tipping. 10x5 seconds for 2 sets      Improved exercise technique, movement at target joints, use of target muscles after mod verbal, visual, tactile cues.      Response to treatment Pt tolerated session well without aggravation of symptoms.       Clinical impression  Improved overall pain level and L shoulder abduction AROM since initial evaluation. Worked on upper thoracic extension to decrease extension stress to cervical spine and decrease B hand paresthesia. Continued working on scapular and ER muscle strengthening to promote better glenohumeral joint mechanics and decrease stress to his L shoulder. Pt tolerated session well without aggravation of symptoms. Pt will benefit from continued skilled physical therapy services to decrease pain and stiffness, improve strength and function.       PATIENT EDUCATION: Education details: there-ex, HEP Person educated: Patient Education method: Explanation, Demonstration, Tactile cues, Verbal cues, and Handouts Education comprehension: verbalized understanding and returned demonstration  HOME EXERCISE PROGRAM:  Access Code: [email or web address] URL: [email or web address] Date: 09/01/2022 Prepared by: Loralyn Freshwater  Exercises - Seated Shoulder Inferior Glide  - 3 x daily - 7 x weekly - 3 sets - 10 reps - 5 seconds hold - Isometric Tricep Extension   - 3 x daily - 7 x weekly - 3 sets - 10 reps - 5 seconds hold - Seated Scapular Retraction  - 3 x daily - 7 x weekly - 3 sets - 10 reps - 5 seconds hold  - Seated Shoulder Flexion AAROM with Pulley Behind  -  1 x daily - 7 x weekly - 3 sets - 10 reps - 5 seconds hold  - Seated Shoulder Abduction AAROM with  Pulley Behind  - 1 x daily - 7 x weekly - 3 sets - 10 reps - 5 seconds hold  - Supine Shoulder External Rotation in 45 Degrees Abduction AAROM with Dowel  - 2 x daily - 7 x weekly - 3 sets - 10 reps - 5 seconds hold  - Standing Shoulder External Rotation with Resistance  - 1 x daily - 7 x weekly - 3 sets - 10 reps  - Seated Cervical Retraction  - 1 x daily - 7 x weekly - 3 sets - 10 reps - 5 seconds hold    PT Short Term Goals - 08/26/22 1619       PT SHORT TERM GOAL #1   Title Pt will be independent with his initial HEP to decrease pain, improve ROM, strength and ability to reach more comfortably.    Time 3    Period Weeks    Status New    Target Date 09/19/22              PT Long Term Goals - 08/26/22 1620       PT LONG TERM GOAL #1   Title Pt will have a decrease in L shoulder pain to 4/10 or less at worst to promote ability to reach up, to the side, and behind him more comfortably.    Baseline 7/10 L shoulder pain at worst for the past 3 months (08/26/2022)    Time 8    Period Weeks    Status New    Target Date 10/24/22      PT LONG TERM GOAL #2   Title Pt will improve L shoulder functional IR to L thumb to T8 spinous process to promote ability to reach behind his back as well as don and doff clothes more comfortably.    Baseline L shoulder functional IR: L thumb to T11 spinous process (08/26/2022)    Time 8    Period Weeks    Status New    Target Date 10/24/22      PT LONG TERM GOAL #3   Title Pt will improve L shoulder flexion and abduction AROM by at least 20 degrees to promote ability to reach more comfortably.    Baseline L shoulder AROM 118 degrees flexion, 120 degrees abduction (08/26/2022)    Time 8    Period Weeks    Status New    Target Date 10/24/22      PT LONG TERM GOAL #4   Title Pt will improve L shoulder ER and IR strength by at least 1/2 MMT  strength to promote ability to raise his arm with less pain.    Baseline ER 4/5, IR 4/5 (08/26/2022)    Time 8    Period Weeks    Status New    Target Date 10/24/22              Plan - 09/29/22 1034     Clinical Impression Statement Improved overall pain level and L shoulder abduction AROM since initial evaluation. Worked on upper thoracic extension to decrease extension stress to cervical spine and decrease B hand paresthesia. Continued working on scapular and ER muscle strengthening to promote better glenohumeral joint mechanics and decrease stress to his L shoulder. Pt tolerated session well without aggravation of symptoms. Pt will benefit from continued skilled physical therapy services to decrease pain and stiffness, improve strength and function.    Personal Factors and Comorbidities Comorbidity 3+;Age;Past/Current Experience;Time since onset of injury/illness/exacerbation;Fitness  Comorbidities A-fib, prostate CA, arthritis, PE, TIA    Examination-Activity Limitations Bathing;Reach Overhead;Dressing;Hygiene/Grooming;Toileting;Caring for Others;Lift;Carry    Stability/Clinical Decision Making Stable/Uncomplicated    Rehab Potential Fair    PT Frequency 2x / week    PT Duration 8 weeks    PT Treatment/Interventions Therapeutic activities;Therapeutic exercise;Neuromuscular re-education;Manual techniques;Biofeedback;Electrical Stimulation;Iontophoresis 4mg /ml Dexamethasone;Patient/family education;Dry needling    PT Next Visit Plan posture, scapular, ER, IR strengthening, thoracic extension, glenohumeral mechanics, manual techniques, modalities PRN    PT Home Exercise Plan medbridge Access Code: [email or web address]    Consulted and Agree with Plan of Care Patient               Loralyn Freshwater PT, DPT  09/29/2022, 11:29 AM

## 2022-10-01 ENCOUNTER — Encounter: Payer: Medicare PPO | Admitting: Physical Therapy

## 2022-10-02 ENCOUNTER — Ambulatory Visit: Payer: Medicare Other

## 2022-10-02 ENCOUNTER — Other Ambulatory Visit: Payer: Medicare PPO

## 2022-10-03 ENCOUNTER — Ambulatory Visit: Payer: Medicare PPO

## 2022-10-03 DIAGNOSIS — M25512 Pain in left shoulder: Secondary | ICD-10-CM | POA: Diagnosis not present

## 2022-10-03 DIAGNOSIS — G8929 Other chronic pain: Secondary | ICD-10-CM

## 2022-10-03 DIAGNOSIS — M25612 Stiffness of left shoulder, not elsewhere classified: Secondary | ICD-10-CM

## 2022-10-03 NOTE — Therapy (Signed)
OUTPATIENT PHYSICAL THERAPY TREATMENT NOTE And Progress Report (08/26/2022 - 10/03/2022)   Patient Name: Nicholas [name] MRN: [id number] DOB:1945/10/13, 77 y.o., male Today's Date: 10/03/2022  PCP: Danella Penton, MD  REFERRING PROVIDER: Christena Flake, MD   END OF SESSION:  PT End of Session - 10/03/22 1036     Visit Number 10    Number of Visits 17    Date for PT Re-Evaluation 10/24/22    PT Start Time 1036    PT Stop Time 1117    PT Time Calculation (min) 41 min    Activity Tolerance Patient tolerated treatment well    Behavior During Therapy [facility] for tasks assessed/performed                     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   Dysrhythmia    Pulmonary embolism (HCC)    Skin cancer    TIA (transient ischemic attack)    Past Surgical History:  Procedure Laterality Date   AMPUTATION TOE Right 06/28/2022   Procedure: RIGHT SECOND TOE AMPUTATION;  Surgeon: Rosetta Posner, DPM;  Location: [facility] ORS;  Service: Podiatry;  Laterality: Right;   APPENDECTOMY     COLONOSCOPY WITH PROPOFOL N/A 11/23/2019   Procedure: COLONOSCOPY WITH PROPOFOL;  Surgeon: Toledo, Boykin Nearing, MD;  Location: [facility] ENDOSCOPY;  Service: Gastroenterology;  Laterality: N/A;   cyber knife surgery     PROSTATE SURGERY     Patient Active Problem List   Diagnosis Date Noted   Hypokalemia 06/30/2022   Paroxysmal atrial fibrillation (HCC) 06/27/2022   Gout 06/27/2022   Peripheral neuropathy 06/27/2022   History of TIA (transient ischemic attack) 06/27/2022   Cellulitis of right leg 06/27/2022   Cellulitis of toe of right foot 06/27/2022   Sepsis due to cellulitis (HCC) 06/26/2022   COVID-19 virus infection 02/17/2021   A-fib (HCC)    TIA (transient ischemic attack)    Lactic acidosis    Hyponatremia    Hypotension    Generalized weakness     REFERRING DIAG:   M19.012 (ICD-10-CM) -  Primary osteoarthritis, left shoulder  M75.82 (ICD-10-CM) - Other shoulder lesions, left shoulder  M75.112 (ICD-10-CM) - Incomplete rotator cuff tear or rupture of left shoulder, not specified as traumatic  M75.22 (ICD-10-CM) - Bicipital tendinitis, left shoulder     THERAPY DIAG:  Chronic left shoulder pain  Stiffness of left shoulder, not elsewhere classified  Rationale for Evaluation and Treatment Rehabilitation  PERTINENT HISTORY: L shoulder pain. 10 years ago, pt went [place] a shoot in a museum with his grandchildren and had to hold onto a round tube and had to maintain a sustained pull up which strained his L shoulder. The shoulder got better but got worse after the last year or so. Was doing PT at the hospital but was discharged secondary to progress. MRI showed arthritis in L shoulder. Doctor said for pt to keep using his L shoulder until he cannot tolerate it anymore and might get shots and possibly surgery. 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 Feels ok the day of doing exercises but shoulder bothers him the next day. Pain has gradually worsened for the past year. Pt is L hand   PRECAUTIONS: Fall risk   SUBJECTIVE:    Last scheduled session: 10/10/2022   SUBJECTIVE STATEMENT: L shoulder is alright. Does not have the  pain he used to have. Does not do his HEP enough. L shoulder is not painful like it was when he moves it in the morning. This round of PT is better than the previous round.      PAIN:  Are you having pain? See subjective   TODAY'S TREATMENT:                                                                                                                                         DATE: 10/03/2022  Towel IR stretch bothers his shoulder a lot.    No latex allergies       Therapeutic exercise    L shoulder AROM at start of session  Flexion 120 degrees flexion  Abduction 141 degrees abduction   Seated manually resisted shoulder ER and IR 1x each  way  Seated L shoulder functional IR  Reviewed progress/current status with PT towards goals.    Seated self L shoulder joint inferior glide at table 10x3 with 5 second holds  Seated isometrics L triceps extension at table 10x3 with 5 second holds  After aforementioned exercises L shoulder AROM   Flexion 132 degrees flexion  Abduction 150 degrees abduction   Seated chin tuck 10x5 seconds for 3 sets to promote upper thoracic extension and decrease stress to lower cervical spine.          Improved exercise technique, movement at target joints, use of target muscles after mod verbal, visual, tactile cues.      Response to treatment Pt tolerated session well without aggravation of symptoms.       Clinical impression  Pt demonstrates decreased L shoulder pain, overall improved L shoulder AROM and strength since initial evaluation, especially with treatment to improve L shoulder joint mobility and posterior and inferior glide of his L humeral head. Improved L shoulder flexion and abduction AROM after session. Pt will benefit from continued skilled physical therapy services to decrease pain and stiffness, improve strength and function.       PATIENT EDUCATION: Education details: there-ex, HEP Person educated: Patient Education method: Explanation, Demonstration, Tactile cues, Verbal cues, and Handouts Education comprehension: verbalized understanding and returned demonstration  HOME EXERCISE PROGRAM:  Access Code: [email or web address] URL: [email or web address] Date: 09/01/2022 Prepared by: Loralyn Freshwater  Exercises - Seated Shoulder Inferior Glide  - 3 x daily - 7 x weekly - 3 sets - 10 reps - 5 seconds hold - Isometric Tricep Extension   - 3 x daily - 7 x weekly - 3 sets - 10 reps - 5 seconds hold - Seated Scapular Retraction  - 3 x daily - 7 x weekly - 3 sets - 10 reps - 5 seconds hold  - Seated Shoulder Flexion AAROM with Pulley Behind  - 1 x daily - 7 x weekly  - 3 sets - 10 reps - 5 seconds hold  - Seated Shoulder Abduction AAROM  with Pulley Behind  - 1 x daily - 7 x weekly - 3 sets - 10 reps - 5 seconds hold  - Supine Shoulder External Rotation in 45 Degrees Abduction AAROM with Dowel  - 2 x daily - 7 x weekly - 3 sets - 10 reps - 5 seconds hold  - Standing Shoulder External Rotation with Resistance  - 1 x daily - 7 x weekly - 3 sets - 10 reps  - Seated Cervical Retraction  - 1 x daily - 7 x weekly - 3 sets - 10 reps - 5 seconds hold    PT Short Term Goals - 10/03/22 1039       PT SHORT TERM GOAL #1   Title Pt will be independent with his initial HEP to decrease pain, improve ROM, strength and ability to reach more comfortably.    Baseline No questions wiht HEP (10/03/2022)    Time 3    Period Days    Status Achieved    Target Date 09/19/22              PT Long Term Goals - 10/03/22 1041       PT LONG TERM GOAL #1   Title Pt will have a decrease in L shoulder pain to 4/10 or less at worst to promote ability to reach up, to the side, and behind him more comfortably.    Baseline 7/10 L shoulder pain at worst for the past 3 months (08/26/2022) 1/10 at worst for the past 7 days (09/29/2022)    Time 8    Period Weeks    Status Achieved    Target Date 10/24/22      PT LONG TERM GOAL #2   Title Pt will improve L shoulder functional IR to L thumb to T8 spinous process to promote ability to reach behind his back as well as don and doff clothes more comfortably.    Baseline L shoulder functional IR: L thumb to T11 spinous process (08/26/2022); L thumb to T9 spinous process (10/03/2022)    Time 8    Period Weeks    Status Partially Met    Target Date 10/24/22      PT LONG TERM GOAL #3   Title Pt will improve L shoulder flexion and abduction AROM by at least 20 degrees to promote ability to reach more comfortably.    Baseline L shoulder AROM 118 degrees flexion, 120 degrees abduction (08/26/2022); 120 degrees flexion, 125 degrees abduction  (improved to 132 degrees flexion and 150 degrees abduction after today's session) (10/03/2022)    Time 8    Period Weeks    Status Partially Met    Target Date 10/24/22      PT LONG TERM GOAL #4   Title Pt will improve L shoulder ER and IR strength by at least 1/2 MMT strength to promote ability to raise his arm with less pain.    Baseline ER 4/5, IR 4/5 (08/26/2022); ER 4+/5, IR 4+/5 (10/03/2022)    Time 8    Period Weeks    Status Achieved    Target Date 10/24/22              Plan - 10/03/22 1035     Clinical Impression Statement Pt demonstrates decreased L shoulder pain, overall improved L shoulder AROM and strength since initial evaluation, especially with treatment to improve L shoulder joint mobility and posterior and inferior glide of his L humeral head. Improved L shoulder flexion and abduction  AROM after session. Pt will benefit from continued skilled physical therapy services to decrease pain and stiffness, improve strength and function.    Personal Factors and Comorbidities Comorbidity 3+;Age;Past/Current Experience;Time since onset of injury/illness/exacerbation;Fitness    Comorbidities A-fib, prostate CA, arthritis, PE, TIA    Examination-Activity Limitations Bathing;Reach Overhead;Dressing;Hygiene/Grooming;Toileting;Caring for Others;Lift;Carry    Stability/Clinical Decision Making Stable/Uncomplicated    Clinical Decision Making Low    Rehab Potential Fair    PT Frequency 2x / week    PT Duration 8 weeks    PT Treatment/Interventions Therapeutic activities;Therapeutic exercise;Neuromuscular re-education;Manual techniques;Biofeedback;Electrical Stimulation;Iontophoresis 4mg /ml Dexamethasone;Patient/family education;Dry needling    PT Next Visit Plan posture, scapular, ER, IR strengthening, thoracic extension, glenohumeral mechanics, manual techniques, modalities PRN    PT Home Exercise Plan medbridge Access Code: [email or web address]    Consulted and Agree with Plan of Care Patient              Thank you for your referral.    Loralyn Freshwater PT, DPT  10/03/2022, 11:29 AM

## 2022-10-06 ENCOUNTER — Encounter: Payer: Medicare PPO | Admitting: Physical Therapy

## 2022-10-06 ENCOUNTER — Ambulatory Visit: Payer: Medicare PPO

## 2022-10-07 ENCOUNTER — Ambulatory Visit: Payer: Medicare PPO

## 2022-10-07 DIAGNOSIS — M25512 Pain in left shoulder: Secondary | ICD-10-CM | POA: Diagnosis not present

## 2022-10-07 DIAGNOSIS — M25612 Stiffness of left shoulder, not elsewhere classified: Secondary | ICD-10-CM

## 2022-10-07 DIAGNOSIS — G8929 Other chronic pain: Secondary | ICD-10-CM

## 2022-10-07 NOTE — Therapy (Signed)
OUTPATIENT PHYSICAL THERAPY TREATMENT    Patient Name: Nicholas [name] MRN: [id number] DOB:[birth year], 77 y.o., male Today's Date: 10/07/2022  PCP: Danella Penton, MD  REFERRING PROVIDER: Christena Flake, MD   END OF SESSION:  PT End of Session - 10/07/22 1349     Visit Number 11    Number of Visits 17    Date for PT Re-Evaluation 10/24/22    PT Start Time 1350    PT Stop Time 1429    PT Time Calculation (min) 39 min    Activity Tolerance Patient tolerated treatment well    Behavior During Therapy [facility] for tasks assessed/performed                     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   Dysrhythmia    Pulmonary embolism (HCC)    Skin cancer    TIA (transient ischemic attack)    Past Surgical History:  Procedure Laterality Date   AMPUTATION TOE Right 06/28/2022   Procedure: RIGHT SECOND TOE AMPUTATION;  Surgeon: Rosetta Posner, DPM;  Location: [facility] ORS;  Service: Podiatry;  Laterality: Right;   APPENDECTOMY     COLONOSCOPY WITH PROPOFOL N/A 11/23/2019   Procedure: COLONOSCOPY WITH PROPOFOL;  Surgeon: Toledo, Boykin Nearing, MD;  Location: [facility] END[facility];  Service: Gastroenterology;  Laterality: N/A;   cyber knife surgery     PROSTATE SURGERY     Patient Active Problem List   Diagnosis Date Noted   Hypokalemia 06/30/2022   Paroxysmal atrial fibrillation (HCC) 06/27/2022   Gout 06/27/2022   Peripheral neuropathy 06/27/2022   History of TIA (transient ischemic attack) 06/27/2022   Cellulitis of right leg 06/27/2022   Cellulitis of toe of right foot 06/27/2022   Sepsis due to cellulitis (HCC) 06/26/2022   COVID-19 virus infection 02/17/2021   A-fib (HCC)    TIA (transient ischemic attack)    Lactic acidosis    Hyponatremia    Hypotension    Generalized weakness     REFERRING DIAG:   M19.012 (ICD-10-CM) - Primary osteoarthritis, left shoulder  M75.82 (ICD-10-CM)  - Other shoulder lesions, left shoulder  M75.112 (ICD-10-CM) - Incomplete rotator cuff tear or rupture of left shoulder, not specified as traumatic  M75.22 (ICD-10-CM) - Bicipital tendinitis, left shoulder     THERAPY DIAG:  Chronic left shoulder pain  Stiffness of left shoulder, not elsewhere classified  Rationale for Evaluation and Treatment Rehabilitation  PERTINENT HISTORY: L shoulder pain. 10 years ago, pt went down a shoot in a museum with his grandchildren and had to hold onto a round tube and had to maintain a sustained pull up which strained his L shoulder. The shoulder got better but got worse after the last year or so. Was doing PT at the hospital but was discharged secondary to progress. MRI showed arthritis in L shoulder. Doctor said for pt to keep using his L shoulder until he cannot tolerate it anymore and might get shots and possibly surgery. 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 Feels ok the day of doing exercises but shoulder bothers him the next day. Pain has gradually worsened for the past year. Pt is L hand   PRECAUTIONS: Fall risk   SUBJECTIVE:    Last scheduled session: 10/10/2022   SUBJECTIVE STATEMENT: L shoulder is ok. A little sore but not much.  PAIN:  Are you having pain? See subjective   TODAY'S TREATMENT:                                                                                                                                         DATE: 10/07/2022  Towel IR stretch bothers his shoulder a lot.    No latex allergies    Therapeutic exercise    L shoulder AROM at start of session  Flexion 122 degrees flexion  Abduction 140 degrees abduction   Seated self L shoulder joint inferior glide at table 10x3 with 5 second holds  Seated isometrics L triceps extension at table 10x3 with 5 second holds  L Shoulder ER red band 5x4  Seated chin tuck 10x5 seconds for 3 sets to promote upper thoracic extension and decrease stress to  lower cervical spine.   Seated L shoulder extension with scapular retraction blue band 10x5 seconds for 3 sets      Improved exercise technique, movement at target joints, use of target muscles after mod verbal, visual, tactile cues.      Response to treatment Pt tolerated session well without aggravation of symptoms.       Clinical impression   Continued working on improving L shoulder joint mobility, inferior and posterior glide, scapular strengthening to improve glenohumeral mechanics to decrease L shoulder pain. Continued working on upper thoracic extension to decrease cervical extension stress to decrease B hand paresthesia. Pt tolerated session well without aggravation of symptoms. Pt will benefit from continued skilled physical therapy services to decrease pain and stiffness, improve strength and function.       PATIENT EDUCATION: Education details: there-ex, HEP Person educated: Patient Education method: Explanation, Demonstration, Tactile cues, Verbal cues, and Handouts Education comprehension: verbalized understanding and returned demonstration  HOME EXERCISE PROGRAM:  Access Code: [email or web address] URL: [email or web address] Date: 09/01/2022 Prepared by: Loralyn Freshwater  Exercises - Seated Shoulder Inferior Glide  - 3 x daily - 7 x weekly - 3 sets - 10 reps - 5 seconds hold - Isometric Tricep Extension   - 3 x daily - 7 x weekly - 3 sets - 10 reps - 5 seconds hold - Seated Scapular Retraction  - 3 x daily - 7 x weekly - 3 sets - 10 reps - 5 seconds hold  - Seated Shoulder Flexion AAROM with Pulley Behind  - 1 x daily - 7 x weekly - 3 sets - 10 reps - 5 seconds hold  - Seated Shoulder Abduction AAROM with Pulley Behind  - 1 x daily - 7 x weekly - 3 sets - 10 reps - 5 seconds hold  - Supine Shoulder External Rotation in 45 Degrees Abduction AAROM with Dowel  - 2 x daily - 7 x weekly - 3 sets - 10 reps - 5 seconds hold  - Standing Shoulder External Rotation  with Resistance  - 1 x daily -  7 x weekly - 3 sets - 10 reps  - Seated Cervical Retraction  - 1 x daily - 7 x weekly - 3 sets - 10 reps - 5 seconds hold    PT Short Term Goals - 10/03/22 1039       PT SHORT TERM GOAL #1   Title Pt will be independent with his initial HEP to decrease pain, improve ROM, strength and ability to reach more comfortably.    Baseline No questions wiht HEP (10/03/2022)    Time 3    Period Days    Status Achieved    Target Date 09/19/22              PT Long Term Goals - 10/03/22 1041       PT LONG TERM GOAL #1   Title Pt will have a decrease in L shoulder pain to 4/10 or less at worst to promote ability to reach up, to the side, and behind him more comfortably.    Baseline 7/10 L shoulder pain at worst for the past 3 months (08/26/2022) 1/10 at worst for the past 7 days (09/29/2022)    Time 8    Period Weeks    Status Achieved    Target Date 10/24/22      PT LONG TERM GOAL #2   Title Pt will improve L shoulder functional IR to L thumb to T8 spinous process to promote ability to reach behind his back as well as don and doff clothes more comfortably.    Baseline L shoulder functional IR: L thumb to T11 spinous process (08/26/2022); L thumb to T9 spinous process (10/03/2022)    Time 8    Period Weeks    Status Partially Met    Target Date 10/24/22      PT LONG TERM GOAL #3   Title Pt will improve L shoulder flexion and abduction AROM by at least 20 degrees to promote ability to reach more comfortably.    Baseline L shoulder AROM 118 degrees flexion, 120 degrees abduction (08/26/2022); 120 degrees flexion, 125 degrees abduction (improved to 132 degrees flexion and 150 degrees abduction after today's session) (10/03/2022)    Time 8    Period Weeks    Status Partially Met    Target Date 10/24/22      PT LONG TERM GOAL #4   Title Pt will improve L shoulder ER and IR strength by at least 1/2 MMT strength to promote ability to raise his arm with less pain.     Baseline ER 4/5, IR 4/5 (08/26/2022); ER 4+/5, IR 4+/5 (10/03/2022)    Time 8    Period Weeks    Status Achieved    Target Date 10/24/22              Plan - 10/07/22 1349     Clinical Impression Statement Continued working on improving L shoulder joint mobility, inferior and posterior glide, scapular strengthening to improve glenohumeral mechanics to decrease L shoulder pain. Continued working on upper thoracic extension to decrease cervical extension stress to decrease B hand paresthesia. Pt tolerated session well without aggravation of symptoms. Pt will benefit from continued skilled physical therapy services to decrease pain and stiffness, improve strength and function.    Personal Factors and Comorbidities Comorbidity 3+;Age;Past/Current Experience;Time since onset of injury/illness/exacerbation;Fitness    Comorbidities A-fib, prostate CA, arthritis, PE, TIA    Examination-Activity Limitations Bathing;Reach Overhead;Dressing;Hygiene/Grooming;Toileting;Caring for Others;Lift;Carry    Stability/Clinical Decision Making Stable/Uncomplicated    Rehab  Potential Fair    PT Frequency 2x / week    PT Duration 8 weeks    PT Treatment/Interventions Therapeutic activities;Therapeutic exercise;Neuromuscular re-education;Manual techniques;Biofeedback;Electrical Stimulation;Iontophoresis 4mg /ml Dexamethasone;Patient/family education;Dry needling    PT Next Visit Plan posture, scapular, ER, IR strengthening, thoracic extension, glenohumeral mechanics, manual techniques, modalities PRN    PT Home Exercise Plan medbridge Access Code: [email or web address]    Consulted and Agree with Plan of Care Patient              Loralyn Freshwater PT, DPT  10/07/2022, 4:27 PM

## 2022-10-10 ENCOUNTER — Ambulatory Visit: Payer: Medicare PPO | Attending: Surgery

## 2022-10-10 DIAGNOSIS — M6281 Muscle weakness (generalized): Secondary | ICD-10-CM | POA: Insufficient documentation

## 2022-10-10 DIAGNOSIS — M25612 Stiffness of left shoulder, not elsewhere classified: Secondary | ICD-10-CM | POA: Insufficient documentation

## 2022-10-10 DIAGNOSIS — M25619 Stiffness of unspecified shoulder, not elsewhere classified: Secondary | ICD-10-CM | POA: Diagnosis present

## 2022-10-10 DIAGNOSIS — M25512 Pain in left shoulder: Secondary | ICD-10-CM | POA: Diagnosis present

## 2022-10-10 DIAGNOSIS — G8929 Other chronic pain: Secondary | ICD-10-CM | POA: Insufficient documentation

## 2022-10-10 NOTE — Therapy (Signed)
OUTPATIENT PHYSICAL THERAPY TREATMENT    Patient Name: Nicholas [name] MRN: [id number] DOB:November 22, 1945, 77 y.o., male Today's Date: 10/10/2022  PCP: Danella Penton, MD  REFERRING PROVIDER: Christena Flake, MD   END OF SESSION:  PT End of Session - 10/10/22 1032     Visit Number 12    Number of Visits 17    Date for PT Re-Evaluation 10/24/22    PT Start Time 1032    PT Stop Time 1050    PT Time Calculation (min) 18 min    Activity Tolerance Patient tolerated treatment well    Behavior During Therapy [facility] for tasks assessed/performed                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   Dysrhythmia    Pulmonary embolism (HCC)    Skin cancer    TIA (transient ischemic attack)    Past Surgical History:  Procedure Laterality Date   AMPUTATION TOE Right 06/28/2022   Procedure: RIGHT SECOND TOE AMPUTATION;  Surgeon: Rosetta Posner, DPM;  Location: [facility] ORS;  Service: Podiatry;  Laterality: Right;   APPENDECTOMY     COLONOSCOPY WITH PROPOFOL N/A 11/23/2019   Procedure: COLONOSCOPY WITH PROPOFOL;  Surgeon: Toledo, Boykin Nearing, MD;  Location: [facility];  Service: Gastroenterology;  Laterality: N/A;   cyber knife surgery     PROSTATE SURGERY     Patient Active Problem List   Diagnosis Date Noted   Hypokalemia 06/30/2022   Paroxysmal atrial fibrillation (HCC) 06/27/2022   Gout 06/27/2022   Peripheral neuropathy 06/27/2022   History of TIA (transient ischemic attack) 06/27/2022   Cellulitis of right leg 06/27/2022   Cellulitis of toe of right foot 06/27/2022   Sepsis due to cellulitis (HCC) 06/26/2022   COVID-19 virus infection 02/17/2021   A-fib (HCC)    TIA (transient ischemic attack)    Lactic acidosis    Hyponatremia    Hypotension    Generalized weakness     REFERRING DIAG:   M19.012 (ICD-10-CM) - Primary osteoarthritis, left shoulder  M75.82 (ICD-10-CM) - Other  shoulder lesions, left shoulder  M75.112 (ICD-10-CM) - Incomplete rotator cuff tear or rupture of left shoulder, not specified as traumatic  M75.22 (ICD-10-CM) - Bicipital tendinitis, left shoulder     THERAPY DIAG:  Chronic left shoulder pain  Stiffness of left shoulder, not elsewhere classified  Limited range of motion (ROM) of shoulder  Muscle weakness (generalized)  Rationale for Evaluation and Treatment Rehabilitation  PERTINENT HISTORY: L shoulder pain. 10 years ago, pt went down a shoot in a museum with his grandchildren and had to hold onto a round tube and had to maintain a sustained pull up which strained his L shoulder. The shoulder got better but got worse after the last year or so. Was doing PT at the hospital but was discharged secondary to progress. MRI showed arthritis in L shoulder. Doctor said for pt to keep using his L shoulder until he cannot tolerate it anymore and might get shots and possibly surgery. 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 Feels ok the day of doing exercises but shoulder bothers him the next day. Pain has gradually worsened for the past year. Pt is L hand   PRECAUTIONS: Fall risk   SUBJECTIVE:   Pt reports he is doing well and is ready for discharge.  Pt states he is  a little sore from the [facility] yesterday, but otherwise is doing well.   SUBJECTIVE STATEMENT: L shoulder is ok. A little sore but not much.    PAIN:  Are you having pain? See subjective   TODAY'S TREATMENT: DATE: 10/10/22    TherAct:  Goal assessment performed and noted below:     Response to treatment Pt tolerated session well without aggravation of symptoms.       Clinical impression   Pt has made continuous improvement throughout therapy and has made good progress towards goals set forth at initial evaluation.  Pt does note some increased soreness during session today, which limited some movements, but pt was still able to achieve all goals.  Pt is  currently at baseline level and would continue to benefit from continued HEP at this time.  Pt is formally discharged from PT and advised to contact clinic with any needs in the future.        PATIENT EDUCATION: Education details: there-ex, HEP Person educated: Patient Education method: Explanation, Demonstration, Tactile cues, Verbal cues, and Handouts Education comprehension: verbalized understanding and returned demonstration  HOME EXERCISE PROGRAM:  Access Code: [email or web address] URL: [email or web address] Date: 09/01/2022 Prepared by: Loralyn Freshwater  Exercises - Seated Shoulder Inferior Glide  - 3 x daily - 7 x weekly - 3 sets - 10 reps - 5 seconds hold - Isometric Tricep Extension   - 3 x daily - 7 x weekly - 3 sets - 10 reps - 5 seconds hold - Seated Scapular Retraction  - 3 x daily - 7 x weekly - 3 sets - 10 reps - 5 seconds hold  - Seated Shoulder Flexion AAROM with Pulley Behind  - 1 x daily - 7 x weekly - 3 sets - 10 reps - 5 seconds hold  - Seated Shoulder Abduction AAROM with Pulley Behind  - 1 x daily - 7 x weekly - 3 sets - 10 reps - 5 seconds hold  - Supine Shoulder External Rotation in 45 Degrees Abduction AAROM with Dowel  - 2 x daily - 7 x weekly - 3 sets - 10 reps - 5 seconds hold  - Standing Shoulder External Rotation with Resistance  - 1 x daily - 7 x weekly - 3 sets - 10 reps  - Seated Cervical Retraction  - 1 x daily - 7 x weekly - 3 sets - 10 reps - 5 seconds hold    PT Short Term Goals - 10/03/22 1039       PT SHORT TERM GOAL #1   Title Pt will be independent with his initial HEP to decrease pain, improve ROM, strength and ability to reach more comfortably.    Baseline No questions wiht HEP (10/03/2022)    Time 3    Period Days    Status Achieved    Target Date 09/19/22              PT Long Term Goals - 10/03/22 1041       PT LONG TERM GOAL #1   Title Pt will have a decrease in L shoulder pain to 4/10 or less at worst to promote  ability to reach up, to the side, and behind him more comfortably.    Baseline 7/10 L shoulder pain at worst for the past 3 months (08/26/2022) 1/10 at worst for the past 7 days (09/29/2022)  10/10/22: 2-3/10   Time 8    Period Weeks    Status Achieved    Target Date  10/24/22      PT LONG TERM GOAL #2   Title Pt will improve L shoulder functional IR to L thumb to T8 spinous process to promote ability to reach behind his back as well as don and doff clothes more comfortably.    Baseline L shoulder functional IR: L thumb to T11 spinous process (08/26/2022); L thumb to T9 spinous process (10/03/2022)  10/10/22: L thumb to T8 spinous process   Time 8    Period Weeks    Status Achieved   Target Date 10/24/22      PT LONG TERM GOAL #3   Title Pt will improve L shoulder flexion and abduction AROM by at least 20 degrees to promote ability to reach more comfortably.    Baseline L shoulder AROM 118 degrees flexion, 120 degrees abduction (08/26/2022); 120 degrees flexion, 125 degrees abduction (improved to 132 degrees flexion and 150 degrees abduction after /today's session) (10/03/2022)  10/10/22: 140 deg flexion, 160 abduction   Time 8    Period Weeks    Status Achieved   Target Date 10/24/22      PT LONG TERM GOAL #4   Title Pt will improve L shoulder ER and IR strength by at least 1/2 MMT strength to promote ability to raise his arm with less pain.    Baseline ER 4/5, IR 4/5 (08/26/2022); ER 4+/5, IR 4+/5 (10/03/2022)  10/10/22: ER 5/5; IR 5/5   Time 8    Period Weeks    Status Achieved    Target Date 10/24/22                 Nolon Bussing, PT, DPT Physical Therapist - [facility]  10/10/22, 10:59 AM

## 2022-11-04 ENCOUNTER — Encounter: Payer: Medicare PPO | Admitting: Physical Therapy

## 2022-11-06 ENCOUNTER — Encounter: Payer: Medicare PPO | Admitting: Physical Therapy

## 2022-11-12 ENCOUNTER — Encounter: Payer: Medicare PPO | Admitting: Physical Therapy

## 2022-11-17 ENCOUNTER — Encounter: Payer: Medicare PPO | Admitting: Physical Therapy

## 2022-11-24 ENCOUNTER — Encounter: Payer: Medicare PPO | Admitting: Physical Therapy

## 2022-11-26 ENCOUNTER — Encounter: Payer: Medicare PPO | Admitting: Physical Therapy

## 2022-12-04 ENCOUNTER — Encounter: Payer: Medicare PPO | Admitting: Physical Therapy

## 2022-12-08 ENCOUNTER — Encounter: Payer: Medicare PPO | Admitting: Physical Therapy

## 2022-12-08 NOTE — Progress Notes (Unsigned)
Cardiology Office Note  Date:  12/08/2022   ID:  Nicholas [name] 1945-09-02, MRN [id number]  PCP:  Danella Penton, MD   No chief complaint on file.   HPI:  [name] is a 77 year old gentleman with past medical history of Persistent atrial fibrillation since 2006 TIAs 2006, 2010 initially treated with warfarin, aspirin added later secondary to TIAs Epistaxis 2012 transition to aspirin and Xarelto Previously tried flecainide, Tikosyn, Multaq, Ablation was considered but patient preferred rate control with low-dose atenolol History of sleep apnea on CPAP Migraines, prostate cancer (cyber knife surgery x 6) Who presents for follow-up of his permanent atrial fibrillation  Last seen by myself in clinic November 2021 Lost to follow-up since that time  moved from [place] to [facility] requested, received and reviewed on today's visit from outpatient cardiologist and primary care  Prior echocardiograms 2010, 2012, 2015 all with normal ejection fraction Echocardiogram April 2021 at outside facility Ejection fraction 55 to 60%, severely dilated left and right atrium, mild to moderate AI  Prior stress test 2010, 2015 Stress test June 28, 2019 Diaphragmatic attenuation artifact noted Small fixed defect anterior wall mild, suspected secondary to artifact Ejection fraction 69%  Tolerated atenolol 12.5 BID, BP always low  Neuropathy at night, comes and goes  Baseline EKG from December 12, 2015 atrial fibrillation with T wave abnormality V3 through V6, 2, 3, aVF  Labs reviewed: WBC 17 in aug 2021 HGB 6.0 Total chol 159, LDL 73  EKG personally reviewed by myself on todays visit Atrial fibrillation ventricular rate 75 bpm T wave abnormality V3 through V6 1 and aVL  PMH:   has a past medical history of A-fib (HCC), Cancer (HCC) (10/25/2019), Chronic gouty arthritis (10/18/2019), Dysrhythmia, Pulmonary embolism (HCC), Skin cancer, and TIA (transient ischemic  attack).  PSH:    Past Surgical History:  Procedure Laterality Date   AMPUTATION TOE Right 06/28/2022   Procedure: RIGHT SECOND TOE AMPUTATION;  Surgeon: Rosetta Posner, DPM;  Location: [facility] OR[facility];  Service: Podiatry;  Laterality: Right;   APPENDECTOMY     COLONOSCOPY WITH PROPOFOL N/A 11/23/2019   Procedure: COLONOSCOPY WITH PROPOFOL;  Surgeon: Toledo, Boykin Nearing, MD;  Location: [facility] END[facility];  Service: Gastroenterology;  Laterality: N/A;   cyber knife surgery     PROSTATE SURGERY      Current Outpatient Medications  Medication Sig Dispense Refill   acetaminophen (TYLENOL) 500 MG tablet Take 2 tablets (1,000 mg total) by mouth every 8 (eight) hours as needed for mild pain (or Fever >/= 101).     aspirin EC 81 MG tablet Take 81 mg by mouth daily. Swallow whole.     atenolol (TENORMIN) 25 MG tablet Take 12.5 mg by mouth 2 (two) times daily.     colchicine 0.6 MG tablet Take 0.6 mg by mouth as needed.     febuxostat (ULORIC) 40 MG tablet Take 40 mg by mouth daily.     gabapentin (NEURONTIN) 100 MG capsule Take 200 mg by mouth 2 (two) times daily.     multivitamin-lutein (OCUVITE-LUTEIN) CAPS capsule Take 1 capsule by mouth daily.     traMADol (ULTRAM) 50 MG tablet Take 1 tablet (50 mg total) by mouth every 6 (six) hours as needed. 20 tablet 0   vitamin C (ASCORBIC ACID) 250 MG tablet Take 1 tablet (250 mg total) by mouth 2 (two) times daily. 60 tablet 2   XARELTO 20 MG TABS tablet Take 20 mg by mouth daily.  zinc sulfate (ZINC-220) 220 (50 Zn) MG capsule Take 1 capsule (220 mg total) by mouth daily. 90 capsule 0   No current facility-administered medications for this visit.     Allergies:   Penicillins and Allopurinol   Social History:  The patient  reports that he has never smoked. He has never used smokeless tobacco. He reports that he does not drink alcohol and does not use drugs.   Family History:   family history includes Hypertension in his mother.    Review of  Systems: Review of Systems  Constitutional: Negative.   HENT: Negative.    Respiratory: Negative.    Cardiovascular: Negative.   Gastrointestinal: Negative.   Musculoskeletal: Negative.   Neurological: Negative.   Psychiatric/Behavioral: Negative.    All other systems reviewed and are negative.    PHYSICAL EXAM: VS:  There were no vitals taken for this visit. , BMI There is no height or weight on file to calculate BMI. Constitutional:  oriented to person, place, and time. No distress.  HENT:  Head: Grossly normal Eyes:  no discharge. No scleral icterus.  Neck: No JVD, no carotid bruits  Cardiovascular: Regular rate and rhythm, no murmurs appreciated Pulmonary/Chest: Clear to auscultation bilaterally, no wheezes or rails Abdominal: Soft.  no distension.  no tenderness.  Musculoskeletal: Normal range of motion Neurological:  normal muscle tone. Coordination normal. No atrophy Skin: Skin warm and dry Psychiatric: normal affect, pleasant  Recent Labs: 06/26/2022: ALT 20 06/28/2022: Magnesium 1.9 06/30/2022: BUN 18; Creatinine, Ser 1.04; Hemoglobin 13.5; Platelets 219; Potassium 3.4; Sodium 137    Lipid Panel No results found for: "CHOL", "HDL", "LDLCALC", "TRIG"    Wt Readings from Last 3 Encounters:  06/26/22 186 lb (84.4 kg)  04/03/22 265 lb (120.2 kg)  03/27/21 247 lb (112 kg)     ASSESSMENT AND PLAN:  Problem List Items Addressed This Visit   None   Permanent atrial fibrillation Rate controlled, asymptomatic Continue atenolol 12.5 twice daily Recommend he call for any orthostasis symptoms Continue Xarelto, discussed risk of bleeding  TIA Reports remote history, outside cardiologist had placed him on warfarin aspirin initially then transition to aspirin and Xarelto Denies any recurrent TIAs, discussed whether he needed to continue on aspirin, will continue for now and will discuss further in follow-up Prior history of epistaxis, for recurrent symptoms would  stop the aspirin  Leg weakness/deconditioning Lifestyle modification recommended, walking program  Obesity As above recommend dietary changes walking program lifestyle modification  Obstructive sleep apnea, on CPAP Stressed  compliance    Total encounter time more than 60 minutes  Greater than 50% was spent in counseling and coordination of care with the patient    Signed, Dossie Arbour, M.D., Ph.D. [facility] [place], [place] [phone]

## 2022-12-09 ENCOUNTER — Ambulatory Visit (INDEPENDENT_AMBULATORY_CARE_PROVIDER_SITE_OTHER): Payer: Medicare PPO | Admitting: Dermatology

## 2022-12-09 ENCOUNTER — Encounter: Payer: Self-pay | Admitting: Dermatology

## 2022-12-09 ENCOUNTER — Ambulatory Visit: Payer: Medicare PPO | Attending: Cardiovascular Disease | Admitting: Cardiovascular Disease

## 2022-12-09 ENCOUNTER — Encounter: Payer: Self-pay | Admitting: Cardiovascular Disease

## 2022-12-09 VITALS — BP 109/64

## 2022-12-09 VITALS — BP 110/60 | HR 62 | Ht 75.5 in | Wt 264.4 lb

## 2022-12-09 DIAGNOSIS — L570 Actinic keratosis: Secondary | ICD-10-CM

## 2022-12-09 DIAGNOSIS — G4733 Obstructive sleep apnea (adult) (pediatric): Secondary | ICD-10-CM

## 2022-12-09 DIAGNOSIS — G459 Transient cerebral ischemic attack, unspecified: Secondary | ICD-10-CM

## 2022-12-09 DIAGNOSIS — L821 Other seborrheic keratosis: Secondary | ICD-10-CM | POA: Diagnosis not present

## 2022-12-09 DIAGNOSIS — W908XXA Exposure to other nonionizing radiation, initial encounter: Secondary | ICD-10-CM | POA: Diagnosis not present

## 2022-12-09 DIAGNOSIS — L578 Other skin changes due to chronic exposure to nonionizing radiation: Secondary | ICD-10-CM | POA: Diagnosis not present

## 2022-12-09 DIAGNOSIS — I4811 Longstanding persistent atrial fibrillation: Secondary | ICD-10-CM

## 2022-12-09 DIAGNOSIS — L820 Inflamed seborrheic keratosis: Secondary | ICD-10-CM | POA: Diagnosis not present

## 2022-12-09 NOTE — Progress Notes (Signed)
Follow-Up Visit   Subjective  Nicholas [name] is a 77 y.o. male who presents for the following: Recheck sun exposed areas for precancerous skin lesions.   The patient has spots, moles and lesions to be evaluated, some may be new or changing and the patient may have concern these could be cancer.   The following portions of the chart were reviewed this encounter and updated as appropriate: medications, allergies, medical history  Review of Systems:  No other skin or systemic complaints except as noted in HPI or Assessment and Plan.  Objective  Well appearing patient in no apparent distress; mood and affect are within normal limits.    A focused examination was performed of the following areas: the face, scalp, chest, back, arms, and hands   Relevant exam findings are noted in the Assessment and Plan.  L temple, L zygoma x 4, L cheek x 2, R face x 2, scalp x 2 (10) Pink scaly macules  face x 2, shoulders x 18, scalp x 2 (22) Stuck on waxy paps with erythema    Assessment & Plan   SEBORRHEIC KERATOSIS - Stuck-on, waxy, tan-brown papules and/or plaques  - Benign-appearing - Discussed benign etiology and prognosis. - Observe - Call for any changes  ACTINIC DAMAGE - chronic, secondary to cumulative UV radiation exposure/sun exposure over time - diffuse scaly erythematous macules with underlying dyspigmentation - Recommend daily broad spectrum sunscreen SPF 30+ to sun-exposed areas, reapply every 2 hours as needed.  - Recommend staying in the shade or wearing long sleeves, sun glasses (UVA+UVB protection) and wide brim hats (4-inch brim around the entire circumference of the hat). - Call for new or changing lesions.   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 They are chronic with expected duration over  1 year. A portion of actinic keratoses will progress to squamous cell carcinoma of the skin. It is not possible to reliably predict which spots will progress to skin cancer and so treatment is recommended to prevent development of skin cancer.  Recommend daily broad spectrum sunscreen SPF 30+ to sun-exposed areas, reapply every 2 hours as needed.  Recommend staying in the shade or wearing long sleeves, sun glasses (UVA+UVB protection) and wide brim hats (4-inch brim around the entire circumference of the hat). Call for new or changing lesions.  Destruction of lesion - L temple, L zygoma x 4, L cheek x 2, R face x 2, scalp x 2 (10) Complexity: simple   Destruction method: cryotherapy   Informed consent: discussed and consent obtained   Timeout:  patient name, date of birth, surgical site, and procedure verified Lesion destroyed using liquid nitrogen: Yes   Region frozen until ice ball extended beyond lesion: Yes   Outcome: patient tolerated procedure well with no complications   Post-procedure details: wound care instructions given    Inflamed seborrheic keratosis (22) face x 2, shoulders x 18, scalp x 2  Symptomatic, irritating, patient would like treated.  Destruction of lesion - face x 2, shoulders x 18, scalp x 2 (22) Complexity: simple   Destruction method: cryotherapy   Informed consent: discussed and consent obtained   Timeout:  patient name, date of birth, surgical site, and procedure verified Lesion destroyed using liquid nitrogen: Yes   Region frozen until ice ball extended beyond lesion:  Yes   Outcome: patient tolerated procedure well with no complications   Post-procedure details: wound care instructions given      Return in about 6 months (around 06/09/2023) for UBSE, Hx of AKs.  Maylene Roes, CMA, am acting as scribe for Armida Sans, MD .  I, Ardis Rowan, RMA, am acting as scribe for Armida Sans, MD .   Documentation: I have reviewed the above  documentation for accuracy and completeness, and I agree with the above.  Armida Sans, MD

## 2022-12-09 NOTE — Patient Instructions (Addendum)

## 2022-12-09 NOTE — Patient Instructions (Signed)

## 2022-12-11 ENCOUNTER — Encounter: Payer: Medicare PPO | Admitting: Physical Therapy

## 2022-12-15 ENCOUNTER — Encounter: Payer: Medicare PPO | Admitting: Physical Therapy

## 2022-12-18 ENCOUNTER — Encounter: Payer: Medicare PPO | Admitting: Physical Therapy

## 2022-12-22 ENCOUNTER — Encounter: Payer: Medicare PPO | Admitting: Physical Therapy

## 2022-12-25 ENCOUNTER — Encounter: Payer: Medicare PPO | Admitting: Physical Therapy

## 2022-12-29 ENCOUNTER — Encounter: Payer: Medicare PPO | Admitting: Physical Therapy

## 2023-01-05 ENCOUNTER — Encounter: Payer: Medicare PPO | Admitting: Physical Therapy

## 2023-01-12 ENCOUNTER — Encounter: Payer: Medicare PPO | Admitting: Physical Therapy

## 2023-01-19 ENCOUNTER — Encounter: Payer: Medicare PPO | Admitting: Physical Therapy

## 2023-02-09 ENCOUNTER — Encounter: Payer: Medicare PPO | Admitting: Physical Therapy

## 2023-02-16 ENCOUNTER — Encounter: Payer: Medicare PPO | Admitting: Physical Therapy

## 2023-02-23 ENCOUNTER — Encounter: Payer: Medicare PPO | Admitting: Physical Therapy

## 2023-03-30 ENCOUNTER — Other Ambulatory Visit: Payer: Self-pay

## 2023-03-30 DIAGNOSIS — Z8546 Personal history of malignant neoplasm of prostate: Secondary | ICD-10-CM

## 2023-03-31 ENCOUNTER — Other Ambulatory Visit: Payer: Medicare PPO

## 2023-03-31 DIAGNOSIS — Z8546 Personal history of malignant neoplasm of prostate: Secondary | ICD-10-CM

## 2023-04-01 LAB — PSA: Prostate Specific Ag, Serum: 0.1 ng/mL (ref 0.0–4.0)

## 2023-04-08 ENCOUNTER — Ambulatory Visit (INDEPENDENT_AMBULATORY_CARE_PROVIDER_SITE_OTHER): Payer: Medicare PPO | Admitting: Urology

## 2023-04-08 ENCOUNTER — Encounter: Payer: Self-pay | Admitting: Urology

## 2023-04-08 VITALS — BP 120/64 | HR 98 | Ht 75.0 in | Wt 251.9 lb

## 2023-04-08 DIAGNOSIS — Z8546 Personal history of malignant neoplasm of prostate: Secondary | ICD-10-CM

## 2023-04-08 NOTE — Progress Notes (Signed)
I, Maysun Anabel Bene, acting as a scribe for Riki Altes, MD., have documented all relevant documentation on the behalf of Riki Altes, MD, as directed by Riki Altes, MD while in the presence of Riki Altes, MD.  04/08/2023 3:58 PM   Nicholas [name] [birth year] [id number]  Referring provider: Danella Penton, MD [phone] [facility] [address] [facility] [place],  [place] [address]  Chief Complaint  Patient presents with   Prostate Cancer   Urologic history: 1. Intermediate risk prostate cancer Diagnosed 06/2012 PSA 6.8 1/12 cores positive Gleason 3+4 adenocarcinoma involving 100% Treated with CyberKnife radiation completed 10/2012   2.  Microhematuria 04/2020 CTU bilateral renal cysts and 4 mm nonobstructing left renal calculus No lower tract abnormalities on cystoscopy-prostate enlargement with hypervascularity Cytology negative  HPI: Nicholas [name] is a 78 y.o. male presents for annual follow-up.   No significant complaint since last visit.  He does note occasional urgency and post-void dribbling.  Denies dysuria, gross hematuria.  Denies flank, abdominal, pelvic pain PSA drawn 03/31/2023 remains low at 0.1. 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   Dysrhythmia    Pulmonary embolism (HCC)    Skin cancer    TIA (transient ischemic attack)     Surgical History: Past Surgical History:  Procedure Laterality Date   AMPUTATION TOE Right 06/28/2022   Procedure: RIGHT SECOND TOE AMPUTATION;  Surgeon: Rosetta Posner, DPM;  Location: [facility] OR[facility];  Service: Podiatry;  Laterality: Right;   APPENDECTOMY     COLONOSCOPY WITH PROPOFOL N/A  11/23/2019   Procedure: COLONOSCOPY WITH PROPOFOL;  Surgeon: [name], Boykin Nearing, MD;  Location: [facility];  Service: Gastroenterology;  Laterality: N/A;   cyber knife surgery     PROSTATE SURGERY      Home Medications:  Allergies as of 04/08/2023       Reactions   Penicillins Other (See Comments)   Tolerated ceftriaxone 06/27/22   Allopurinol Hives        Medication List        Accurate as of April 08, 2023  3:58 PM. If you have any questions, ask your nurse or doctor.          STOP taking these medications    cephALEXin 500 MG capsule Commonly known as: KEFLEX Stopped by: Riki Altes   traMADol 50 MG tablet Commonly known as: Ultram Stopped by: Riki Altes       TAKE these medications    acetaminophen 500 MG tablet Commonly known as: TYLENOL Take 2 tablets (1,000 mg total) by mouth every 8 (eight) hours as needed for mild pain (or Fever >/= 101).   aspirin EC 81 MG tablet Take 81 mg by mouth daily. Swallow whole.   atenolol 25 MG tablet Commonly known as: TENORMIN Take 12.5 mg by mouth 2 (two) times daily.   colchicine 0.6 MG tablet Take 0.6 mg by mouth as needed.   D 1000 25 MCG (1000 UT) capsule Generic drug: Cholecalciferol Take 1,000 Units by mouth daily.   febuxostat 40 MG  tablet Commonly known as: ULORIC Take 40 mg by mouth daily.   gabapentin 100 MG capsule Commonly known as: NEURONTIN Take 200 mg by mouth 2 (two) times daily.   multivitamin-lutein Caps capsule Take 1 capsule by mouth daily.   vitamin C 250 MG tablet Commonly known as: ASCORBIC ACID Take 1 tablet (250 mg total) by mouth 2 (two) times daily.   Xarelto 20 MG Tabs tablet Generic drug: rivaroxaban Take 20 mg by mouth daily.   zinc sulfate (50mg  elemental zinc) 220 (50 Zn) MG capsule Commonly known as: Zinc-220 Take 1 capsule (220 mg total) by mouth daily.        Allergies:  Allergies  Allergen Reactions   Penicillins Other (See Comments)     Tolerated ceftriaxone 06/27/22   Allopurinol Hives    Family History: Family History  Problem Relation Age of Onset   Hypertension Mother     Social History:  reports that he has never smoked. He has never used smokeless tobacco. He reports that he does not drink alcohol and does not use drugs.   Physical Exam: BP 120/64   Pulse 98   Ht 6\' 3"  (1.905 m)   Wt 251 lb 14.4 oz (114.3 kg)   BMI 31.49 kg/m   Constitutional:  Alert and oriented, No acute distress. HEENT: [place] AT Respiratory: Normal respiratory effort, no increased work of breathing. Psychiatric: Normal mood and affect.  Assessment & Plan:    1. History prostate cancer PSA remains low and stable Will not order PSA next year as it was checked by his PCP.  He is 11 years out from treatment and we discussed PRN follow-up with return for rising PSA He wanted to go ahead and schedule a 1 year follow-up, however if his PSA is stable when checked by PCP, he may cancel the visit and reschedule for 1 year later.  I have reviewed the above documentation for accuracy and completeness, and I agree with the above.   Riki Altes, MD  [facility] [address], [address] [place], [place] [address] [phone]

## 2023-05-02 IMAGING — DX DG CHEST 1V PORT
2 series · 2 of 2 positions shown · non-contrast
Comparison: None.

CLINICAL DATA: Fall this morning with generalized weakness.  Cough.

EXAM:
PORTABLE CHEST 1 VIEW

[chest ap (1 of 2)]
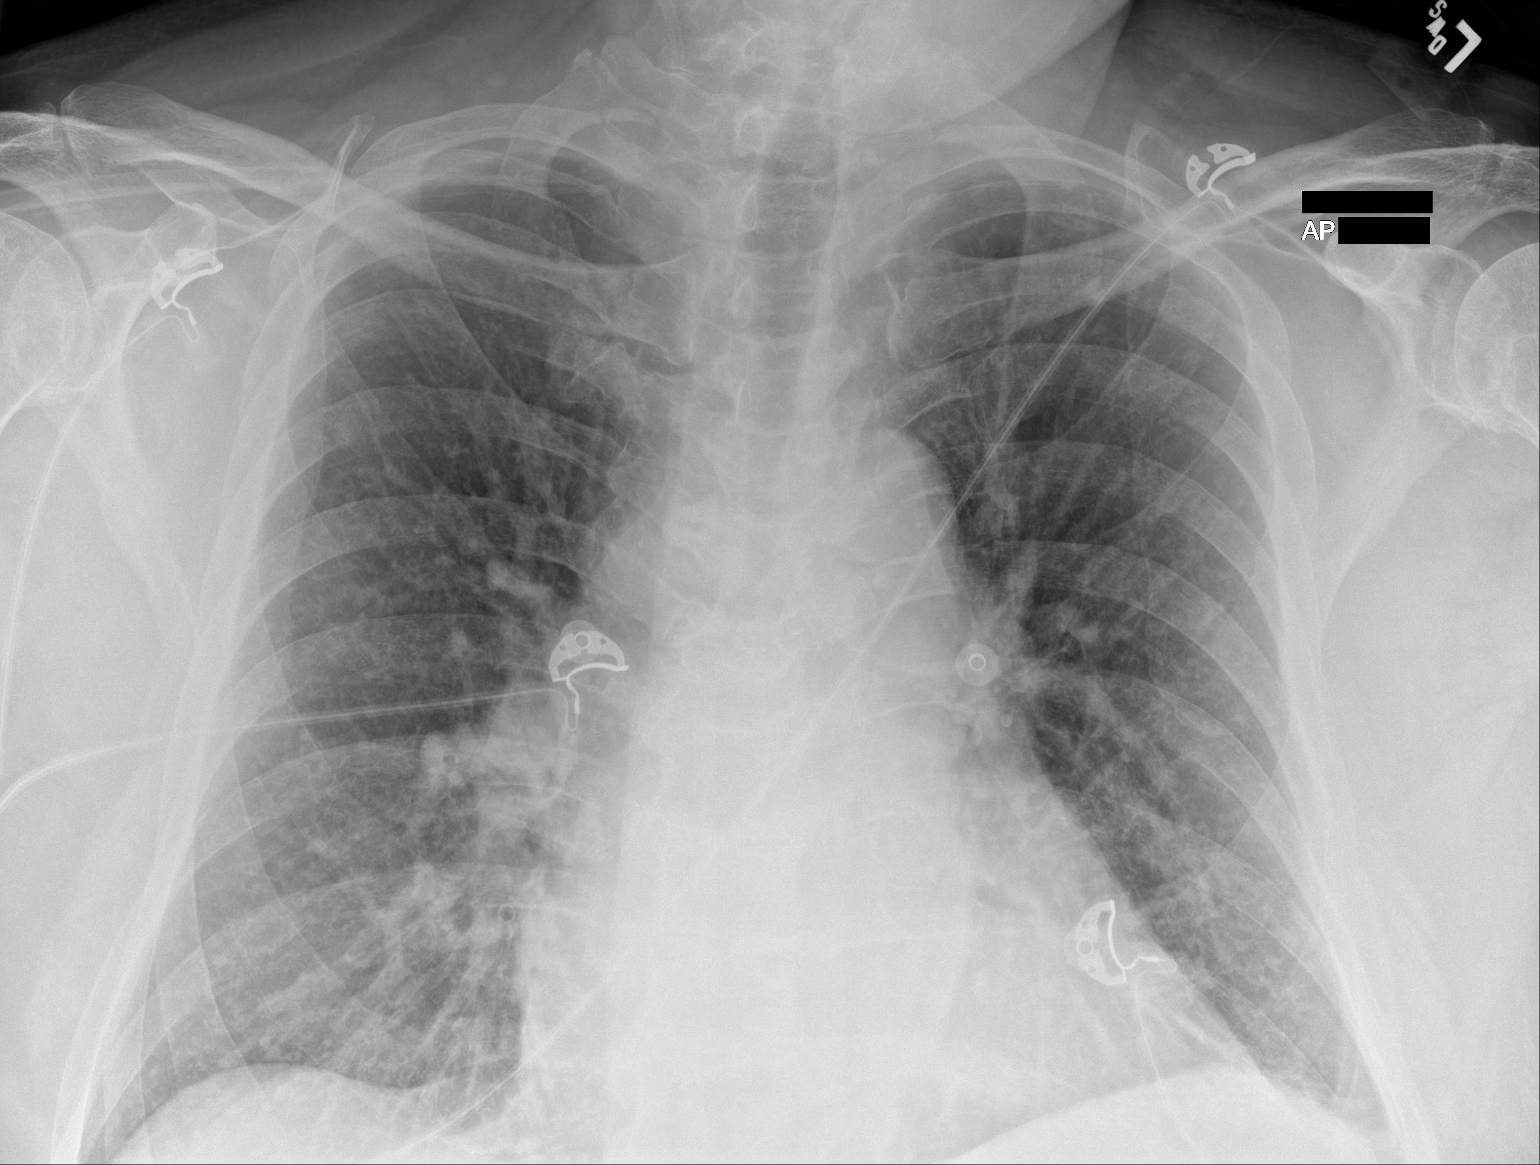

[chest ap (2 of 2)]
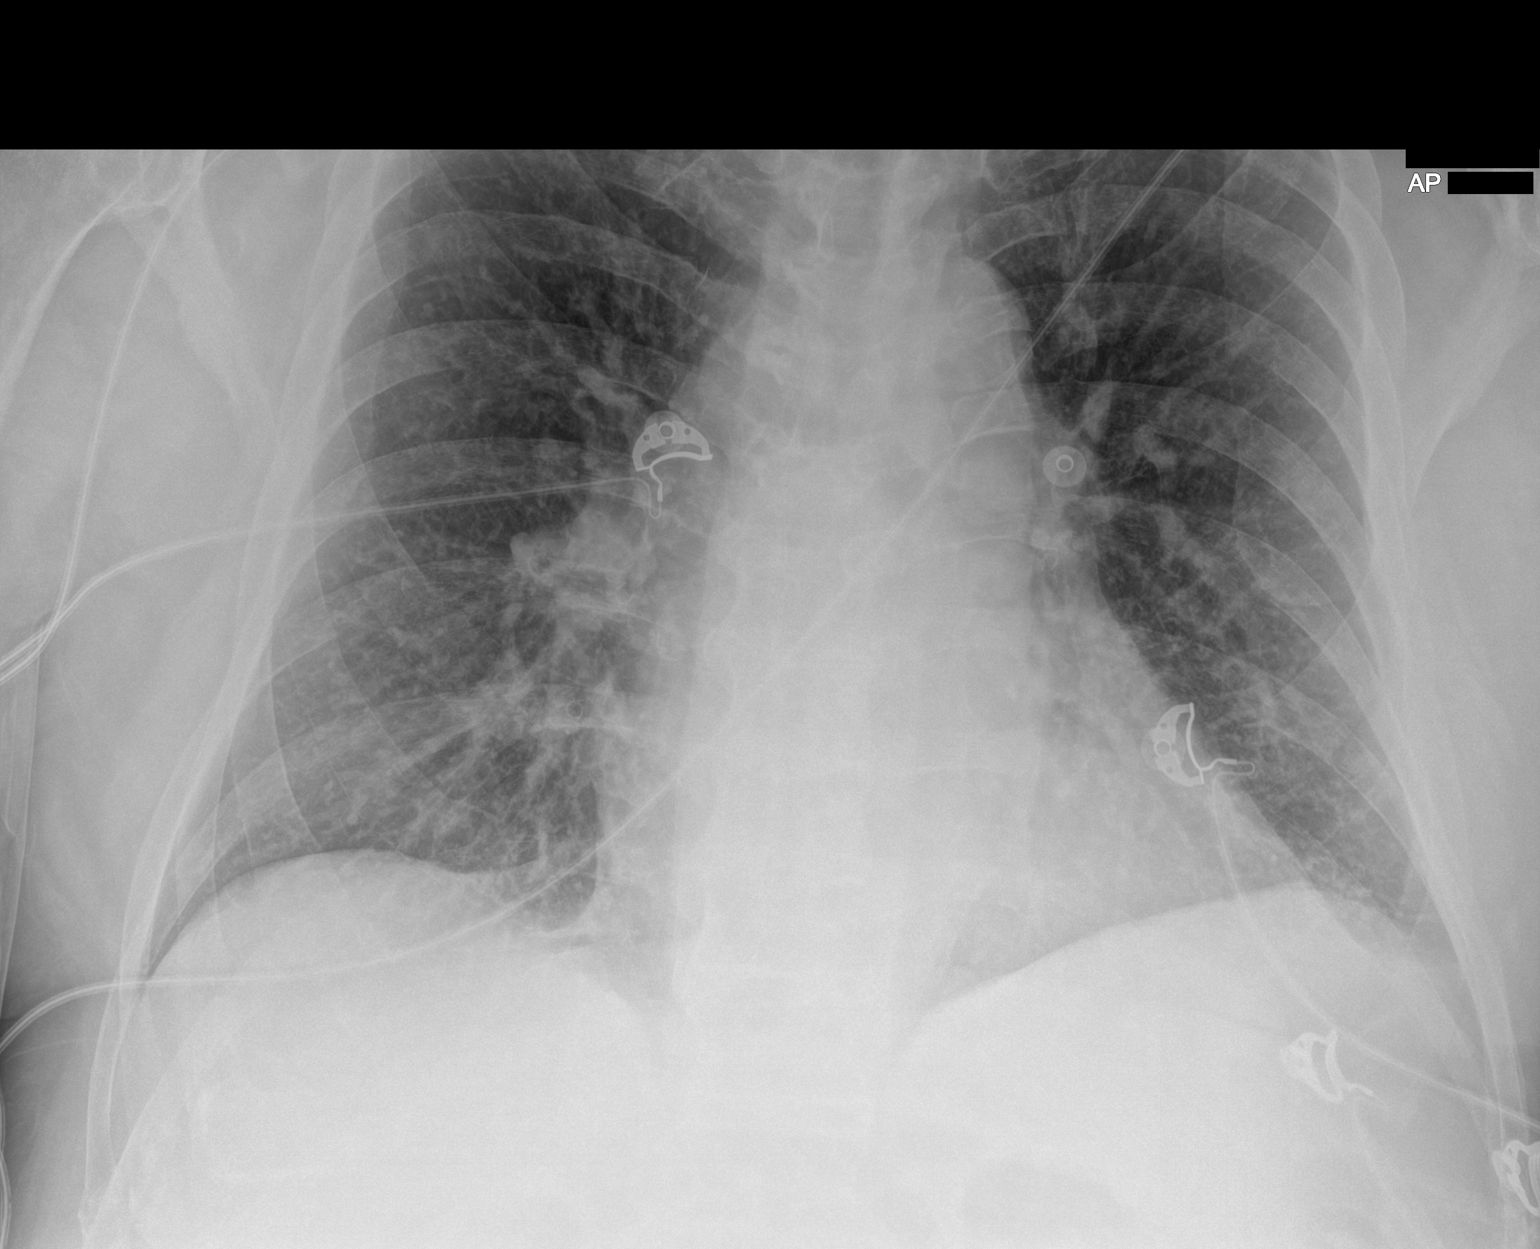

[2 of 2 positions shown; findings below may reference images not displayed]

FINDINGS: Lungs are adequately inflated without focal airspace consolidation
or effusion. No pneumothorax. Cardiomediastinal silhouette is within
normal. No acute fracture. Degenerative change of the spine.
IMPRESSION: No acute findings.

## 2023-07-23 ENCOUNTER — Ambulatory Visit: Payer: Medicare PPO | Admitting: Dermatology

## 2023-08-17 ENCOUNTER — Ambulatory Visit: Admitting: Dermatology

## 2023-08-25 ENCOUNTER — Other Ambulatory Visit: Payer: Self-pay | Admitting: Dermatology

## 2023-08-25 DIAGNOSIS — L219 Seborrheic dermatitis, unspecified: Secondary | ICD-10-CM

## 2023-10-12 ENCOUNTER — Encounter: Payer: Self-pay | Admitting: Urology

## 2024-04-05 ENCOUNTER — Ambulatory Visit: Admitting: Urology

## 2024-04-07 ENCOUNTER — Ambulatory Visit: Payer: Self-pay | Admitting: Urology

## 2024-04-12 ENCOUNTER — Ambulatory Visit: Admitting: Urology
